# Patient Record
Sex: Female | Born: 1955 | Race: White | Hispanic: No | Smoking: Former smoker
Health system: Southern US, Community
[De-identification: ages and names within clinical notes are randomized; demographics above are authoritative.]

## PROBLEM LIST (undated history)

## (undated) DIAGNOSIS — J449 Chronic obstructive pulmonary disease, unspecified: Secondary | ICD-10-CM

## (undated) DIAGNOSIS — G8929 Other chronic pain: Secondary | ICD-10-CM

## (undated) DIAGNOSIS — G459 Transient cerebral ischemic attack, unspecified: Secondary | ICD-10-CM

## (undated) DIAGNOSIS — R251 Tremor, unspecified: Secondary | ICD-10-CM

## (undated) DIAGNOSIS — J439 Emphysema, unspecified: Secondary | ICD-10-CM

## (undated) DIAGNOSIS — G4730 Sleep apnea, unspecified: Secondary | ICD-10-CM

## (undated) DIAGNOSIS — J45909 Unspecified asthma, uncomplicated: Secondary | ICD-10-CM

## (undated) DIAGNOSIS — Z9221 Personal history of antineoplastic chemotherapy: Secondary | ICD-10-CM

## (undated) DIAGNOSIS — R413 Other amnesia: Secondary | ICD-10-CM

## (undated) DIAGNOSIS — M1990 Unspecified osteoarthritis, unspecified site: Secondary | ICD-10-CM

## (undated) DIAGNOSIS — F329 Major depressive disorder, single episode, unspecified: Secondary | ICD-10-CM

## (undated) DIAGNOSIS — C50919 Malignant neoplasm of unspecified site of unspecified female breast: Secondary | ICD-10-CM

## (undated) DIAGNOSIS — Z803 Family history of malignant neoplasm of breast: Secondary | ICD-10-CM

## (undated) DIAGNOSIS — K219 Gastro-esophageal reflux disease without esophagitis: Secondary | ICD-10-CM

## (undated) DIAGNOSIS — F32A Depression, unspecified: Secondary | ICD-10-CM

## (undated) DIAGNOSIS — K635 Polyp of colon: Secondary | ICD-10-CM

## (undated) DIAGNOSIS — F419 Anxiety disorder, unspecified: Secondary | ICD-10-CM

## (undated) DIAGNOSIS — Z923 Personal history of irradiation: Secondary | ICD-10-CM

## (undated) HISTORY — PX: CERVICAL SPINE SURGERY: SHX589

## (undated) HISTORY — DX: Family history of malignant neoplasm of breast: Z80.3

## (undated) HISTORY — DX: Transient cerebral ischemic attack, unspecified: G45.9

## (undated) HISTORY — DX: Emphysema, unspecified: J43.9

## (undated) HISTORY — DX: Other chronic pain: G89.29

## (undated) HISTORY — DX: Chronic obstructive pulmonary disease, unspecified: J44.9

## (undated) HISTORY — DX: Unspecified osteoarthritis, unspecified site: M19.90

## (undated) HISTORY — DX: Depression, unspecified: F32.A

## (undated) HISTORY — PX: INNER EAR SURGERY: SHX679

## (undated) HISTORY — DX: Tremor, unspecified: R25.1

## (undated) HISTORY — DX: Other amnesia: R41.3

## (undated) HISTORY — DX: Polyp of colon: K63.5

## (undated) HISTORY — PX: ABDOMINAL HYSTERECTOMY: SHX81

## (undated) HISTORY — DX: Unspecified asthma, uncomplicated: J45.909

## (undated) HISTORY — DX: Anxiety disorder, unspecified: F41.9

---
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
## 2010-05-19 HISTORY — PX: LUMBAR FUSION: SHX111

## 2018-03-22 LAB — COLOGUARD: Cologuard: NEGATIVE

## 2019-08-01 ENCOUNTER — Ambulatory Visit (INDEPENDENT_AMBULATORY_CARE_PROVIDER_SITE_OTHER): Payer: Medicare Other | Admitting: Physician Assistant

## 2019-08-01 ENCOUNTER — Encounter: Payer: Self-pay | Admitting: Physician Assistant

## 2019-08-01 ENCOUNTER — Other Ambulatory Visit: Payer: Self-pay

## 2019-08-01 VITALS — BP 140/80 | HR 90 | Temp 97.3°F | Ht 60.0 in | Wt 124.4 lb

## 2019-08-01 DIAGNOSIS — F419 Anxiety disorder, unspecified: Secondary | ICD-10-CM

## 2019-08-01 DIAGNOSIS — F339 Major depressive disorder, recurrent, unspecified: Secondary | ICD-10-CM | POA: Insufficient documentation

## 2019-08-01 DIAGNOSIS — M1990 Unspecified osteoarthritis, unspecified site: Secondary | ICD-10-CM | POA: Insufficient documentation

## 2019-08-01 DIAGNOSIS — J439 Emphysema, unspecified: Secondary | ICD-10-CM

## 2019-08-01 DIAGNOSIS — H9192 Unspecified hearing loss, left ear: Secondary | ICD-10-CM

## 2019-08-01 DIAGNOSIS — Z720 Tobacco use: Secondary | ICD-10-CM | POA: Insufficient documentation

## 2019-08-01 DIAGNOSIS — K219 Gastro-esophageal reflux disease without esophagitis: Secondary | ICD-10-CM

## 2019-08-01 DIAGNOSIS — R634 Abnormal weight loss: Secondary | ICD-10-CM

## 2019-08-01 DIAGNOSIS — E785 Hyperlipidemia, unspecified: Secondary | ICD-10-CM | POA: Insufficient documentation

## 2019-08-01 DIAGNOSIS — R6889 Other general symptoms and signs: Secondary | ICD-10-CM

## 2019-08-01 DIAGNOSIS — Z8249 Family history of ischemic heart disease and other diseases of the circulatory system: Secondary | ICD-10-CM

## 2019-08-01 DIAGNOSIS — K5900 Constipation, unspecified: Secondary | ICD-10-CM

## 2019-08-01 DIAGNOSIS — Z981 Arthrodesis status: Secondary | ICD-10-CM

## 2019-08-01 DIAGNOSIS — J449 Chronic obstructive pulmonary disease, unspecified: Secondary | ICD-10-CM | POA: Insufficient documentation

## 2019-08-01 LAB — URINALYSIS, ROUTINE W REFLEX MICROSCOPIC
Bilirubin Urine: NEGATIVE
Hgb urine dipstick: NEGATIVE
Ketones, ur: NEGATIVE
Nitrite: NEGATIVE
RBC / HPF: NONE SEEN (ref 0–?)
Specific Gravity, Urine: 1.015 (ref 1.000–1.030)
Total Protein, Urine: NEGATIVE
Urine Glucose: NEGATIVE
Urobilinogen, UA: 0.2 (ref 0.0–1.0)
pH: 7 (ref 5.0–8.0)

## 2019-08-01 LAB — CBC WITH DIFFERENTIAL/PLATELET
Basophils Absolute: 0.1 10*3/uL (ref 0.0–0.1)
Basophils Relative: 0.7 % (ref 0.0–3.0)
Eosinophils Absolute: 0.1 10*3/uL (ref 0.0–0.7)
Eosinophils Relative: 0.9 % (ref 0.0–5.0)
HCT: 40.4 % (ref 36.0–46.0)
Hemoglobin: 13.4 g/dL (ref 12.0–15.0)
Lymphocytes Relative: 30.5 % (ref 12.0–46.0)
Lymphs Abs: 2.7 10*3/uL (ref 0.7–4.0)
MCHC: 33.2 g/dL (ref 30.0–36.0)
MCV: 95.7 fl (ref 78.0–100.0)
Monocytes Absolute: 0.7 10*3/uL (ref 0.1–1.0)
Monocytes Relative: 7.9 % (ref 3.0–12.0)
Neutro Abs: 5.3 10*3/uL (ref 1.4–7.7)
Neutrophils Relative %: 60 % (ref 43.0–77.0)
Platelets: 390 10*3/uL (ref 150.0–400.0)
RBC: 4.22 Mil/uL (ref 3.87–5.11)
RDW: 13.2 % (ref 11.5–15.5)
WBC: 8.7 10*3/uL (ref 4.0–10.5)

## 2019-08-01 LAB — COMPREHENSIVE METABOLIC PANEL
ALT: 9 U/L (ref 0–35)
AST: 14 U/L (ref 0–37)
Albumin: 4.3 g/dL (ref 3.5–5.2)
Alkaline Phosphatase: 87 U/L (ref 39–117)
BUN: 13 mg/dL (ref 6–23)
CO2: 31 mEq/L (ref 19–32)
Calcium: 10 mg/dL (ref 8.4–10.5)
Chloride: 101 mEq/L (ref 96–112)
Creatinine, Ser: 0.68 mg/dL (ref 0.40–1.20)
GFR: 86.91 mL/min (ref >60.00)
Glucose, Bld: 89 mg/dL (ref 70–99)
Potassium: 5.4 mEq/L — ABNORMAL HIGH (ref 3.5–5.1)
Sodium: 139 mEq/L (ref 135–145)
Total Bilirubin: 0.5 mg/dL (ref 0.2–1.2)
Total Protein: 7 g/dL (ref 6.0–8.3)

## 2019-08-01 LAB — C-REACTIVE PROTEIN: CRP: <1 mg/dL (ref 0.5–20.0)

## 2019-08-01 LAB — SEDIMENTATION RATE: Sed Rate: 40 mm/hr — ABNORMAL HIGH (ref 0–30)

## 2019-08-01 LAB — TSH: TSH: 0.59 u[IU]/mL (ref 0.35–4.50)

## 2019-08-01 MED ORDER — OMEPRAZOLE 40 MG PO CPDR: 40.0000 mg | DELAYED_RELEASE_CAPSULE | Freq: Two times a day (BID) | ORAL | 1 refills | Status: DC

## 2019-08-01 MED ORDER — TRAZODONE HCL 50 MG PO TABS: 50.0000 mg | ORAL_TABLET | Freq: Every day | ORAL | 1 refills | Status: DC

## 2019-08-01 MED ORDER — MONTELUKAST SODIUM 10 MG PO TABS: 10.0000 mg | ORAL_TABLET | Freq: Every day | ORAL | 1 refills | Status: DC

## 2019-08-01 MED ORDER — TRINTELLIX 20 MG PO TABS: 20.0000 mg | ORAL_TABLET | Freq: Every day | ORAL | 1 refills | Status: DC

## 2019-08-01 MED ORDER — CYCLOBENZAPRINE HCL 10 MG PO TABS: 10.0000 mg | ORAL_TABLET | Freq: Three times a day (TID) | ORAL | 1 refills | Status: DC

## 2019-08-01 MED ORDER — GABAPENTIN 800 MG PO TABS: 800.0000 mg | ORAL_TABLET | Freq: Three times a day (TID) | ORAL | 1 refills | Status: DC

## 2019-08-01 MED ORDER — CELECOXIB 100 MG PO CAPS: 100.0000 mg | ORAL_CAPSULE | Freq: Every day | ORAL | 0 refills | Status: DC

## 2019-08-01 MED ORDER — LINACLOTIDE 290 MCG PO CAPS: 290.0000 ug | ORAL_CAPSULE | Freq: Every day | ORAL | 1 refills | Status: DC

## 2019-08-01 MED ORDER — ATORVASTATIN CALCIUM 10 MG PO TABS: 10.0000 mg | ORAL_TABLET | Freq: Every day | ORAL | 1 refills | Status: DC

## 2019-08-01 MED ORDER — ALBUTEROL SULFATE HFA 108 (90 BASE) MCG/ACT IN AERS: 1.0000 | INHALATION_SPRAY | Freq: Four times a day (QID) | RESPIRATORY_TRACT | 5 refills | Status: DC | PRN

## 2019-08-01 NOTE — Patient Instructions (Signed)
It was great to see you!  You will be contacted about your referrals and lab results.  Let's follow-up in 2-4 weeks to follow-up on your weight loss, sooner if you have concerns.  Please work on trying to eat more during the day, consider drinking a Boost or Ensure daily.  Take care,  Inda Coke PA-C

## 2019-08-01 NOTE — Progress Notes (Signed)
Holly Phillips is a 64 y.o. female here to Establish care.  I acted as a Education administrator for [employer], PA-C Anselmo Pickler, LPN  History of Present Illness:   Chief Complaint  Patient presents with  . Establish Care   Tobacco abuse; Emphysema -- started at age 70 years old; unsure if she has history of lung nodules, (possibly on L lung?), not on oxygen. Currently on Albuterol prn and singulair. She states that she was on another inhaler at one point but cannot remember the name of it and said it was unaffordable. She would like referral to pulmonology as she has a history of emphysema.  Lumbar fusion; arthritis -- had a lumbar fusion of L3-L7 in 2012 and has had back pain since; most recent pain management regimen, per patient report is: percocet QID, celebrex 100 mg daily, indomethacin 50 mg daily, gabapentin 800 mg TID, flexeril 10 mg TID prn. She was seeing pain management in [place] and would like referral for evaluation and management at this time.  Depression -- currently on Trintellix 20 mg daily and Trazodone 50 mg daily. Tolerates this regimen well and feels as though her symptoms are currently well controlled. Does have history of suicide attempt x 2 (overdose.)  Depression screen [facility] 2/9 08/01/2019  Decreased Interest 1  Down, Depressed, Hopeless 0  PHQ - 2 Score 1  Altered sleeping 1  Tired, decreased energy 1  Change in appetite 3  Feeling bad or failure about yourself  0  Trouble concentrating 0  Moving slowly or fidgety/restless 0  Suicidal thoughts 0  PHQ-9 Score 6  Difficult doing work/chores Not difficult at all   GAD 7 : Generalized Anxiety Score 08/01/2019  Nervous, Anxious, on Edge 1  Control/stop worrying 1  Worry too much - different things 1  Trouble relaxing 1  Restless 1  Easily annoyed or irritable 0  Afraid - awful might happen 1  Total GAD 7 Score 6  Anxiety Difficulty Somewhat difficult   HLD; Family history of heart disease -- has been on  Lipitor 10 mg for quite some time. Tolerates well without myalgias. Reports that due to her family history of heart disease, she saw cardiology regularly in [place] and would like referral for further evaluation today. Has periodic stress tests per her report.  Chronic constipation -- uses Linzess prn. Does not use more than once a week. Symptoms are well controlled with this medication. Denies unusual abdominal pain or rectal bleeding. States that she is UTD on her colonoscopy.  GERD -- currently on omeprazole 40 mg daily. Feels like this medication works well for her. Denies any unusual abdominal pain, rectal bleeding or other concerns.  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 She had occasional swallowing issues that led her to see an ENT and feels like she is due to follow-up with one. Feels like her swallowing is possibly starting to get worse again.  Unintentional weight loss -- 142 lb last month, and is currently down to 124 lb. Weight loss has been since she moved to [place]. Has altered taste.  Eats only one meal a day at baseline. Does have difficulty swallowing, due to prior history of surgery -- see "abnormal throat concerns" above. No unusual  abdominal pain or rectal bleeding.   Health Maintenance: Immunizations -- not sure will wait for records.  Colonoscopy -- 5 years ago Mammogram -- due June 2021 PAP -- N/A Hysterectomy Bone Density -- Never Weight -- Weight: 124 lb 6.1 oz (56.4 kg)   Depression screen PHQ 2/9 08/01/2019  Decreased Interest 1  Down, Depressed, Hopeless 0  PHQ - 2 Score 1  Altered sleeping 1  Tired, decreased energy 1  Change in appetite 3  Feeling bad or failure about yourself  0  Trouble concentrating 0   Moving slowly or fidgety/restless 0  Suicidal thoughts 0  PHQ-9 Score 6  Difficult doing work/chores Not difficult at all    GAD 7 : Generalized Anxiety Score 08/01/2019  Nervous, Anxious, on Edge 1  Control/stop worrying 1  Worry too much - different things 1  Trouble relaxing 1  Restless 1  Easily annoyed or irritable 0  Afraid - awful might happen 1  Total GAD 7 Score 6  Anxiety Difficulty Somewhat difficult     Other providers/specialists: No care team member to display   Past Medical History:  Diagnosis Date  . Anxiety   . Arthritis   . Asthma   . Colon polyps   . COPD (chronic obstructive pulmonary disease) ([place])   . Depression   . Emphysema of lung ([place])      Social History   Socioeconomic History  . Marital status: Widowed    Spouse name: Not on file  . Number of children: Not on file  . Years of education: Not on file  . Highest education level: Not on file  Occupational History  . Not on file  Tobacco Use  . Smoking status: Current Every Day Smoker    Packs/day: 1.00    Types: Cigarettes  . Smokeless tobacco: Never Used  Substance and Sexual Activity  . Alcohol use: Not Currently  . Drug use: Yes    Types: Other-see comments    Comment: CBD and medical marijuana  . Sexual activity: Not Currently  Other Topics Concern  . Not on file  Social History Narrative   Lives with daughter and 71 y/o grandson   From [place], moved to [place] in 07-18-2019   Husband passed away in 08-14-2016   Social Determinants of Health   Financial Resource Strain:   . Difficulty of Paying Living Expenses:   Food Insecurity:   . Worried About Charity fundraiser in the Last Year:   . Arboriculturist in the Last Year:   Transportation Needs:   . Film/video editor (Medical):   Marland Kitchen Lack of Transportation (Non-Medical):   Physical Activity:   . Days of Exercise per Week:   . Minutes of Exercise per Session:   Stress:   . Feeling of Stress :   Social Connections:    . Frequency of Communication with Friends and Family:   . Frequency of Social Gatherings with Friends and Family:   . Attends Religious Services:   . Active Member of Clubs or Organizations:   . Attends Archivist Meetings:   Marland Kitchen Marital Status:   Intimate Partner Violence:   . Fear of Current or Ex-Partner:   . Emotionally Abused:   Marland Kitchen Physically Abused:   . Sexually Abused:     Past Surgical History:  Procedure Laterality Date  . ABDOMINAL HYSTERECTOMY    . INNER EAR SURGERY    . LUMBAR FUSION  Aug 15, 2010   L3-L7  History reviewed. No pertinent family history.  No Known Allergies   Current Medications:   Current Outpatient Medications:  .  albuterol (VENTOLIN HFA) 108 (90 Base) MCG/ACT inhaler, Inhale 1-2 puffs into the lungs every 6 (six) hours as needed for wheezing or shortness of breath., Disp: 18 g, Rfl: 5 .  atorvastatin (LIPITOR) 10 MG tablet, Take 1 tablet (10 mg total) by mouth daily., Disp: 90 tablet, Rfl: 1 .  celecoxib (CELEBREX) 100 MG capsule, Take 1 capsule (100 mg total) by mouth daily., Disp: 90 capsule, Rfl: 0 .  cyclobenzaprine (FLEXERIL) 10 MG tablet, Take 1 tablet (10 mg total) by mouth 3 (three) times daily., Disp: 90 tablet, Rfl: 1 .  gabapentin (NEURONTIN) 800 MG tablet, Take 1 tablet (800 mg total) by mouth 3 (three) times daily., Disp: 270 tablet, Rfl: 1 .  indomethacin (INDOCIN) 50 MG capsule, , Disp: , Rfl:  .  linaclotide (LINZESS) 290 MCG CAPS capsule, Take 1 capsule (290 mcg total) by mouth daily before breakfast., Disp: 30 capsule, Rfl: 1 .  montelukast (SINGULAIR) 10 MG tablet, Take 1 tablet (10 mg total) by mouth daily., Disp: 90 tablet, Rfl: 1 .  omeprazole (PRILOSEC) 40 MG capsule, Take 1 capsule (40 mg total) by mouth 2 (two) times daily., Disp: 90 capsule, Rfl: 1 .  oxyCODONE-acetaminophen (PERCOCET) 10-325 MG tablet, Take 1 tablet by mouth 4 (four) times daily as needed., Disp: , Rfl:  .  traZODone (DESYREL) 50 MG tablet, Take 1  tablet (50 mg total) by mouth at bedtime., Disp: 90 tablet, Rfl: 1 .  TRINTELLIX 20 MG TABS tablet, Take 1 tablet (20 mg total) by mouth daily., Disp: 90 tablet, Rfl: 1   Review of Systems:   ROS Negative unless otherwise specified per HPI.  Vitals:   Vitals:   08/01/19 1332  BP: 140/80  Pulse: 90  Temp: (!) 97.3 F (36.3 C)  TempSrc: Temporal  SpO2: 93%  Weight: 124 lb 6.1 oz (56.4 kg)  Height: 5' (1.524 m)      Body mass index is 24.29 kg/m.  Physical Exam:   Physical Exam Vitals and nursing note reviewed.  Constitutional:      General: She is not in acute distress.    Appearance: She is well-developed. She is not ill-appearing or toxic-appearing.  Cardiovascular:     Rate and Rhythm: Normal rate and regular rhythm.     Pulses: Normal pulses.     Heart sounds: Normal heart sounds, S1 normal and S2 normal.     Comments: No LE edema Pulmonary:     Effort: Pulmonary effort is normal.     Breath sounds: Normal breath sounds.  Skin:    General: Skin is warm and dry.  Neurological:     Mental Status: She is alert.     GCS: GCS eye subscore is 4. GCS verbal subscore is 5. GCS motor subscore is 6.  Psychiatric:        Attention and Perception: Attention normal.        Mood and Affect: Mood is anxious.        Speech: Speech normal.        Behavior: Behavior normal. Behavior is cooperative.     No results found for this or any previous visit.  Assessment and Plan:   Mariaguadalupe was seen today for establish care.  Diagnoses and all orders for this visit:  Unintentional weight loss Will begin initial work-up of basic labs, urinalysis, inflammatory markers.  Recommended that  she work on trying to eat more protein and consider supplement such as Boost daily.  Also we are requesting records from her prior PCP.  Follow-up in 2 to 4 weeks for further evaluation.  May need to consider imaging at next visit. -     CBC with Differential/Platelet -     Comprehensive  metabolic panel -     TSH -     Urinalysis, Routine w reflex microscopic -     Sedimentation rate -     C-reactive protein  Tobacco abuse Encouraged cessation. -     Ambulatory referral to Pulmonology -     Ambulatory referral to Cardiology  Pulmonary emphysema, unspecified emphysema type [facility]) Referral to pulmonary for further evaluation management.  I have refilled her albuterol inhaler. -     Ambulatory referral to Pulmonology  Arthritis; History of lumbar fusion Reviewed her medications.  I recommended that she does not continue both indomethacin and Celebrex, and she has decided to proceed with Celebrex.  We will discontinue indomethacin.  I have refilled her gabapentin, Flexeril, Celebrex.  I will put in a pain management referral for further management of Percocet. -     Ambulatory referral to Pain Clinic  Recurrent depression ([place]); Anxiety Well-controlled per patient report.  Continue Trintellix 20 mg daily and trazodone 50 mg daily.  Follow-up in 6 months, sooner if concerns.  Denies SI/HI today.  Hyperlipidemia, unspecified hyperlipidemia type I have refilled her Lipitor 10 mg daily. -     Ambulatory referral to Cardiology  Constipation, unspecified constipation type Well managed per patient report with as needed Linzess.  I have refilled this for her today.  Follow-up if any worsening.  Gastroesophageal reflux disease, unspecified whether esophagitis present Well managed per patient report with daily Prilosec.  I have refilled this today for her.  Follow-up if any worsening.  Family history of heart disease Referral to cardiology per patient request. -     Ambulatory referral to Cardiology  Hearing difficulty of left ear; Abnormal ear, nose, and throat evaluation Referral to ENT for further evaluation and management. -     Ambulatory referral to ENT   Other orders -     albuterol (VENTOLIN HFA) 108 (90 Base) MCG/ACT inhaler; Inhale 1-2 puffs into the lungs every  6 (six) hours as needed for wheezing or shortness of breath. -     atorvastatin (LIPITOR) 10 MG tablet; Take 1 tablet (10 mg total) by mouth daily. -     celecoxib (CELEBREX) 100 MG capsule; Take 1 capsule (100 mg total) by mouth daily. -     cyclobenzaprine (FLEXERIL) 10 MG tablet; Take 1 tablet (10 mg total) by mouth 3 (three) times daily. -     gabapentin (NEURONTIN) 800 MG tablet; Take 1 tablet (800 mg total) by mouth 3 (three) times daily. -     linaclotide (LINZESS) 290 MCG CAPS capsule; Take 1 capsule (290 mcg total) by mouth daily before breakfast. -     montelukast (SINGULAIR) 10 MG tablet; Take 1 tablet (10 mg total) by mouth daily. -     omeprazole (PRILOSEC) 40 MG capsule; Take 1 capsule (40 mg total) by mouth 2 (two) times daily. -     traZODone (DESYREL) 50 MG tablet; Take 1 tablet (50 mg total) by mouth at bedtime. -     TRINTELLIX 20 MG TABS tablet; Take 1 tablet (20 mg total) by mouth daily.    . Reviewed expectations re: course of current  medical issues. . Discussed self-management of symptoms. . Outlined signs and symptoms indicating need for more acute intervention. . Patient verbalized understanding and all questions were answered. . See orders for this visit as documented in the electronic medical record. . Patient received an After-Visit Summary.  CMA or LPN served as scribe during this visit. History, Physical, and Plan performed by medical provider. The above documentation has been reviewed and is accurate and complete.  This appointment required 65 minutes of patient care (this includes precharting, chart review, review of results, face-to-face care, etc.).  Inda Coke, PA-C

## 2019-08-02 ENCOUNTER — Other Ambulatory Visit: Payer: Self-pay | Admitting: Physician Assistant

## 2019-08-02 DIAGNOSIS — E875 Hyperkalemia: Secondary | ICD-10-CM

## 2019-08-03 ENCOUNTER — Other Ambulatory Visit (INDEPENDENT_AMBULATORY_CARE_PROVIDER_SITE_OTHER): Payer: Medicare Other

## 2019-08-03 ENCOUNTER — Other Ambulatory Visit: Payer: Self-pay

## 2019-08-03 DIAGNOSIS — E875 Hyperkalemia: Secondary | ICD-10-CM

## 2019-08-03 LAB — BASIC METABOLIC PANEL
BUN: 12 mg/dL (ref 6–23)
CO2: 30 mEq/L (ref 19–32)
Calcium: 9.5 mg/dL (ref 8.4–10.5)
Chloride: 102 mEq/L (ref 96–112)
Creatinine, Ser: 0.69 mg/dL (ref 0.40–1.20)
GFR: 85.46 mL/min (ref >60.00)
Glucose, Bld: 161 mg/dL — ABNORMAL HIGH (ref 70–99)
Potassium: 4.6 mEq/L (ref 3.5–5.1)
Sodium: 138 mEq/L (ref 135–145)

## 2019-08-22 ENCOUNTER — Other Ambulatory Visit: Payer: Self-pay | Admitting: Pain Medicine

## 2019-08-22 DIAGNOSIS — G8929 Other chronic pain: Secondary | ICD-10-CM

## 2019-08-22 DIAGNOSIS — M79651 Pain in right thigh: Secondary | ICD-10-CM

## 2019-08-22 DIAGNOSIS — M545 Low back pain, unspecified: Secondary | ICD-10-CM

## 2019-08-25 DIAGNOSIS — R1310 Dysphagia, unspecified: Secondary | ICD-10-CM | POA: Insufficient documentation

## 2019-08-25 DIAGNOSIS — H906 Mixed conductive and sensorineural hearing loss, bilateral: Secondary | ICD-10-CM | POA: Insufficient documentation

## 2019-08-25 DIAGNOSIS — R1314 Dysphagia, pharyngoesophageal phase: Secondary | ICD-10-CM | POA: Insufficient documentation

## 2019-08-31 ENCOUNTER — Ambulatory Visit: Payer: Federal, State, Local not specified - PPO | Admitting: Cardiovascular Disease

## 2019-09-01 ENCOUNTER — Encounter: Payer: Self-pay | Admitting: Cardiovascular Disease

## 2019-09-01 ENCOUNTER — Other Ambulatory Visit: Payer: Self-pay

## 2019-09-01 ENCOUNTER — Ambulatory Visit (INDEPENDENT_AMBULATORY_CARE_PROVIDER_SITE_OTHER): Payer: Medicare Other | Admitting: Cardiovascular Disease

## 2019-09-01 VITALS — BP 120/72 | HR 100 | Temp 97.1°F | Ht 60.0 in | Wt 127.0 lb

## 2019-09-01 DIAGNOSIS — R079 Chest pain, unspecified: Secondary | ICD-10-CM | POA: Diagnosis not present

## 2019-09-01 DIAGNOSIS — K219 Gastro-esophageal reflux disease without esophagitis: Secondary | ICD-10-CM

## 2019-09-01 DIAGNOSIS — Z720 Tobacco use: Secondary | ICD-10-CM | POA: Diagnosis not present

## 2019-09-01 NOTE — Patient Instructions (Signed)
Medication Instructions:  Your physician recommends that you continue on your current medications as directed. Please refer to the Current Medication list given to you today.  *If you need a refill on your cardiac medications before your next appointment, please call your pharmacy*   Lab Work: None today  If you have labs (blood work) drawn today and your tests are completely normal, you will receive your results only by: Marland Kitchen MyChart Message (if you have MyChart) OR . A paper copy in the mail If you have any lab test that is abnormal or we need to change your treatment, we will call you to review the results.   Testing/Procedures: Your physician has requested that you have en exercise stress myoview. For further information please visit [email or web address]. Please follow instruction sheet, as given.    Follow-Up: At [facility], you and your health needs are our priority.  As part of our continuing mission to provide you with exceptional heart care, we have created designated Provider Care Teams.  These Care Teams include your primary Cardiologist (physician) and Advanced Practice Providers (APPs -  Physician Assistants and Nurse Practitioners) who all work together to provide you with the care you need, when you need it.  We recommend signing up for the patient portal called "MyChart".  Sign up information is provided on this After Visit Summary.  MyChart is used to connect with patients for Virtual Visits (Telemedicine).  Patients are able to view lab/test results, encounter notes, upcoming appointments, etc.  Non-urgent messages can be sent to your provider as well.   To learn more about what you can do with MyChart, go to [email or web address].    Your next appointment:   3 month(s)  The format for your next appointment:   In Person  Provider:   Kate Sable, MD   Other Instructions None       Thank you for choosing [facility]  !

## 2019-09-01 NOTE — Progress Notes (Signed)
CARDIOLOGY CONSULT NOTE  Patient ID: Holly Phillips MRN: [id number] DOB/AGE: [birth year] 64 y.o.  Admit date: (Not on file) Primary Physician: Inda Coke, [place]  Reason for Consultation: Family history of heart disease  HPI: Holly Phillips is a 64 y.o. female who is being seen today as she has a family history of heart disease at the request of Inda Coke, [place].   Past medical history also includes tobacco use and COPD.  She recently moved here from [place].  Her husband passed away in 2016-08-14.  She lives with her daughter and grandson.  She moved here from [place], [place].  She used to undergo a stress test on a yearly basis.  She was told she had some "mild plaque buildup ".  She denies ever having had a cardiac catheterization.  Her last hospitalization was about 2 and half years ago for acute bronchitis.  Since that time she has slowed down and it takes her about 2-1/2 days to clean her house.  Prior to that it took her a day.  She has chest tightness when lying down.  She has chronic exertional dyspnea which is stable.  She does not use oxygen.  She denies leg swelling, orthopnea, paroxysmal nocturnal dyspnea.  She has GERD and takes omeprazole.  ECG performed in the office today which I ordered and personally interpreted demonstrates normal sinus rhythm with no ischemic ST segment or T-wave abnormalities, nor any arrhythmias.  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   Family history: Mother underwent CABG at age 25 and died at the age of 76.  Her sister had 2 coronary artery stents placed at the age of 80.   No Known Allergies  Current Outpatient Medications  Medication Sig Dispense Refill  . albuterol (VENTOLIN HFA) 108 (90 Base) MCG/ACT inhaler Inhale 1-2 puffs into the lungs every 6 (six) hours as needed for wheezing or shortness of breath. 18 g 5  . atorvastatin (LIPITOR) 10 MG tablet Take 1 tablet (10 mg total) by mouth daily. 90 tablet  1  . celecoxib (CELEBREX) 100 MG capsule Take 1 capsule (100 mg total) by mouth daily. 90 capsule 0  . cyclobenzaprine (FLEXERIL) 10 MG tablet Take 1 tablet (10 mg total) by mouth 3 (three) times daily. 90 tablet 1  . gabapentin (NEURONTIN) 800 MG tablet Take 1 tablet (800 mg total) by mouth 3 (three) times daily. 270 tablet 1  . linaclotide (LINZESS) 290 MCG CAPS capsule Take 1 capsule (290 mcg total) by mouth daily before breakfast. 30 capsule 1  . montelukast (SINGULAIR) 10 MG tablet Take 1 tablet (10 mg total) by mouth daily. 90 tablet 1  . omeprazole (PRILOSEC) 40 MG capsule Take 1 capsule (40 mg total) by mouth 2 (two) times daily. 90 capsule 1  . oxyCODONE-acetaminophen (PERCOCET) 10-325 MG tablet Take 1 tablet by mouth 4 (four) times daily as needed.    . traZODone (DESYREL) 150 MG tablet Take 150 mg by mouth at bedtime.    . TRINTELLIX 20 MG TABS tablet Take 1 tablet (20 mg total) by mouth daily. 90 tablet 1   No current facility-administered medications for this visit.    Past Medical History:  Diagnosis Date  . Anxiety   . Arthritis   . Asthma   . Colon polyps   . COPD (chronic obstructive pulmonary disease) ([place])   . Depression   . Emphysema of lung [facility])     Past Surgical History:  Procedure Laterality  Date  . ABDOMINAL HYSTERECTOMY    . INNER EAR SURGERY    . LUMBAR FUSION  2010-08-13   L3-L7    Social History   Socioeconomic History  . Marital status: Widowed    Spouse name: Not on file  . Number of children: Not on file  . Years of education: Not on file  . Highest education level: Not on file  Occupational History  . Not on file  Tobacco Use  . Smoking status: Current Every Day Smoker    Packs/day: 1.00    Types: Cigarettes  . Smokeless tobacco: Never Used  Substance and Sexual Activity  . Alcohol use: Not Currently  . Drug use: Yes    Types: Other-see comments    Comment: CBD and medical marijuana  . Sexual activity: Not Currently  Other Topics  Concern  . Not on file  Social History Narrative   Lives with daughter and 73 y/o grandson   From [place], moved to [place] in July 16, 2019   Husband passed away in August 12, 2016   Social Determinants of Health   Financial Resource Strain:   . Difficulty of Paying Living Expenses:   Food Insecurity:   . Worried About Charity fundraiser in the Last Year:   . Arboriculturist in the Last Year:   Transportation Needs:   . Film/video editor (Medical):   Marland Kitchen Lack of Transportation (Non-Medical):   Physical Activity:   . Days of Exercise per Week:   . Minutes of Exercise per Session:   Stress:   . Feeling of Stress :   Social Connections:   . Frequency of Communication with Friends and Family:   . Frequency of Social Gatherings with Friends and Family:   . Attends Religious Services:   . Active Member of Clubs or Organizations:   . Attends Archivist Meetings:   Marland Kitchen Marital Status:   Intimate Partner Violence:   . Fear of Current or Ex-Partner:   . Emotionally Abused:   Marland Kitchen Physically Abused:   . Sexually Abused:       Current Meds  Medication Sig  . albuterol (VENTOLIN HFA) 108 (90 Base) MCG/ACT inhaler Inhale 1-2 puffs into the lungs every 6 (six) hours as needed for wheezing or shortness of breath.  Marland Kitchen atorvastatin (LIPITOR) 10 MG tablet Take 1 tablet (10 mg total) by mouth daily.  . celecoxib (CELEBREX) 100 MG capsule Take 1 capsule (100 mg total) by mouth daily.  . cyclobenzaprine (FLEXERIL) 10 MG tablet Take 1 tablet (10 mg total) by mouth 3 (three) times daily.  Marland Kitchen gabapentin (NEURONTIN) 800 MG tablet Take 1 tablet (800 mg total) by mouth 3 (three) times daily.  Marland Kitchen linaclotide (LINZESS) 290 MCG CAPS capsule Take 1 capsule (290 mcg total) by mouth daily before breakfast.  . montelukast (SINGULAIR) 10 MG tablet Take 1 tablet (10 mg total) by mouth daily.  Marland Kitchen omeprazole (PRILOSEC) 40 MG capsule Take 1 capsule (40 mg total) by mouth 2 (two) times daily.  Marland Kitchen  oxyCODONE-acetaminophen (PERCOCET) 10-325 MG tablet Take 1 tablet by mouth 4 (four) times daily as needed.  . traZODone (DESYREL) 150 MG tablet Take 150 mg by mouth at bedtime.  . TRINTELLIX 20 MG TABS tablet Take 1 tablet (20 mg total) by mouth daily.  . [DISCONTINUED] traZODone (DESYREL) 50 MG tablet Take 1 tablet (50 mg total) by mouth at bedtime.      Review of systems complete and found to be negative  unless listed above in HPI   Barbarann Ehlers, RN was present throughout the entirety of the encounter.  Physical exam Blood pressure 120/72, pulse 100, temperature (!) 97.1 F (36.2 C), height 5' (1.524 m), weight 127 lb (57.6 kg), SpO2 95 %. General: NAD Neck: No JVD, no thyromegaly or thyroid nodule.  Lungs: Clear to auscultation bilaterally with normal respiratory effort. CV: Nondisplaced PMI. Regular rate and rhythm, normal S1/S2, no S3/S4, no murmur.  No peripheral edema.  No carotid bruit.    Abdomen: Soft, nontender, no distention.  Skin: Intact without lesions or rashes.  Neurologic: Alert and oriented x 3.  Psych: Normal affect. Extremities: No clubbing or cyanosis.  HEENT: Normal.   ECG: Most recent ECG reviewed.   Labs: Lab Results  Component Value Date/Time   K 4.6 08/03/2019 02:08 PM   BUN 12 08/03/2019 02:08 PM   CREATININE 0.69 08/03/2019 02:08 PM   ALT 9 08/01/2019 02:10 PM   TSH 0.59 08/01/2019 02:10 PM   HGB 13.4 08/01/2019 02:10 PM     Lipids: No results found for: LDLCALC, LDLDIRECT, CHOL, TRIG, HDL      ASSESSMENT AND PLAN:   1.  Chest tightness: Symptoms are somewhat atypical but she has a strong family history of premature coronary artery disease and a long history of tobacco use.  She was told she had some "mild plaque buildup "as per previous stress test in [place].  I will try to obtain those records. I will proceed with a nuclear myocardial perfusion imaging study to evaluate for ischemic heart disease (Lexiscan Myoview).  2.  GERD:   Currently on omeprazole.  She does have dysphagia for solids.  3.  Tobacco use: She is a longtime smoker.   Disposition: Follow up in 3 months  Signed: Kate Sable, M.D., F.A.C.C.  09/01/2019, 2:51 PM

## 2019-09-07 ENCOUNTER — Other Ambulatory Visit: Payer: Self-pay

## 2019-09-07 ENCOUNTER — Ambulatory Visit (INDEPENDENT_AMBULATORY_CARE_PROVIDER_SITE_OTHER): Payer: Medicare Other | Admitting: Physician Assistant

## 2019-09-07 ENCOUNTER — Encounter: Payer: Self-pay | Admitting: Physician Assistant

## 2019-09-07 VITALS — BP 124/76 | HR 94 | Temp 97.3°F | Ht 60.0 in | Wt 129.0 lb

## 2019-09-07 DIAGNOSIS — R300 Dysuria: Secondary | ICD-10-CM

## 2019-09-07 DIAGNOSIS — R634 Abnormal weight loss: Secondary | ICD-10-CM | POA: Diagnosis not present

## 2019-09-07 DIAGNOSIS — R1310 Dysphagia, unspecified: Secondary | ICD-10-CM | POA: Diagnosis not present

## 2019-09-07 LAB — POC URINALSYSI DIPSTICK (AUTOMATED)
Bilirubin, UA: NEGATIVE
Blood, UA: NEGATIVE
Glucose, UA: NEGATIVE
Ketones, UA: NEGATIVE
Nitrite, UA: NEGATIVE
Protein, UA: NEGATIVE
Spec Grav, UA: 1.015 (ref 1.010–1.025)
Urobilinogen, UA: 0.2 E.U./dL
pH, UA: 6.5 (ref 5.0–8.0)

## 2019-09-07 NOTE — Progress Notes (Signed)
Holly Phillips is a 64 y.o. female is here for a follow up.  I acted as a Education administrator for [employer], PA-C [employer], [place]  History of Present Illness:   Chief Complaint  Patient presents with  . Unintentional weight loss    HPI   Unintentional weight loss; Dysphagia Gained 2 lb since she last saw me. Doesn't eat breakfast or lunch, and this is normal for her. She has concerns regarding choking -- she gets dysphagia with solids, and this is part of the reason why she doesn't eat much at home (when she is usually alone.) Cannot tolerate breads, meats, salads without having to regurgitate her food. Symptoms are getting worse with time. Denies: rectal bleeding. Had a colonoscopy 5 years ago, had some polyps removed --we do not have this record, she is unsure of when she is supposed to have recall.   Wt Readings from Last 5 Encounters:  09/07/19 129 lb (58.5 kg)  09/01/19 127 lb (57.6 kg)  08/01/19 124 lb 6.1 oz (56.4 kg)   Dysuria Had burning and pressure a week ago. Took AZO for a few days and this improved, last took this on Saturday. Still having some slight issues. Denies: fever, chills, back pain, hx of UTI.   Health Maintenance Due  Topic Date Due  . Hepatitis C Screening  Never done  . HIV Screening  Never done  . COVID-19 Vaccine (1) Never done  . MAMMOGRAM  Never done  . COLONOSCOPY  Never done    Past Medical History:  Diagnosis Date  . Anxiety   . Arthritis   . Asthma   . Colon polyps   . COPD (chronic obstructive pulmonary disease) ([place])   . Depression   . Emphysema of lung ([place])      Social History   Socioeconomic History  . Marital status: Widowed    Spouse name: Not on file  . Number of children: Not on file  . Years of education: Not on file  . Highest education level: Not on file  Occupational History  . Not on file  Tobacco Use  . Smoking status: Current Every Day Smoker    Packs/day: 1.00    Types: Cigarettes  . Smokeless tobacco:  Never Used  Substance and Sexual Activity  . Alcohol use: Not Currently  . Drug use: Yes    Types: Other-see comments    Comment: CBD and medical marijuana  . Sexual activity: Not Currently  Other Topics Concern  . Not on file  Social History Narrative   Lives with daughter and 19 y/o grandson   From [place], moved to [place] in 08-08-19   Husband passed away in 09/04/2016   Social Determinants of Health   Financial Resource Strain:   . Difficulty of Paying Living Expenses:   Food Insecurity:   . Worried About Charity fundraiser in the Last Year:   . Arboriculturist in the Last Year:   Transportation Needs:   . Film/video editor (Medical):   Marland Kitchen Lack of Transportation (Non-Medical):   Physical Activity:   . Days of Exercise per Week:   . Minutes of Exercise per Session:   Stress:   . Feeling of Stress :   Social Connections:   . Frequency of Communication with Friends and Family:   . Frequency of Social Gatherings with Friends and Family:   . Attends Religious Services:   . Active Member of Clubs or Organizations:   .  Attends Archivist Meetings:   Marland Kitchen Marital Status:   Intimate Partner Violence:   . Fear of Current or Ex-Partner:   . Emotionally Abused:   Marland Kitchen Physically Abused:   . Sexually Abused:     Past Surgical History:  Procedure Laterality Date  . ABDOMINAL HYSTERECTOMY    . INNER EAR SURGERY    . LUMBAR FUSION  2012   L3-L7    Family History  Problem Relation Age of Onset  . Depression Mother   . Diabetes Mother   . Hypertension Mother   . Hyperlipidemia Mother   . Heart attack Mother   . Osteoarthritis Father   . Asthma Father   . COPD Father   . Breast cancer Sister   . Heart attack Maternal Grandmother   . Lupus Sister   . Osteoarthritis Sister   . Asthma Sister   . COPD Sister   . Diabetes Sister   . Drug abuse Sister   . Heart attack Sister     PMHx, SurgHx, SocialHx, FamHx, Medications, and Allergies were reviewed in the  Visit Navigator and updated as appropriate.   Patient Active Problem List   Diagnosis Date Noted  . Mixed conductive and sensorineural hearing loss of both ears 08/25/2019  . Pharyngoesophageal dysphagia 08/25/2019  . Tobacco abuse 08/01/2019  . Emphysema of lung ([place]) 08/01/2019  . Arthritis 08/01/2019  . History of lumbar fusion 08/01/2019  . Recurrent depression ([place]) 08/01/2019  . Anxiety 08/01/2019  . Hyperlipidemia 08/01/2019  . Constipation 08/01/2019  . Gastroesophageal reflux disease 08/01/2019  . Family history of heart disease 08/01/2019    Social History   Tobacco Use  . Smoking status: Current Every Day Smoker    Packs/day: 1.00    Types: Cigarettes  . Smokeless tobacco: Never Used  Substance Use Topics  . Alcohol use: Not Currently  . Drug use: Yes    Types: Other-see comments    Comment: CBD and medical marijuana    Current Medications and Allergies:    Current Outpatient Medications:  .  albuterol (VENTOLIN HFA) 108 (90 Base) MCG/ACT inhaler, Inhale 1-2 puffs into the lungs every 6 (six) hours as needed for wheezing or shortness of breath., Disp: 18 g, Rfl: 5 .  atorvastatin (LIPITOR) 10 MG tablet, Take 1 tablet (10 mg total) by mouth daily., Disp: 90 tablet, Rfl: 1 .  celecoxib (CELEBREX) 100 MG capsule, Take 1 capsule (100 mg total) by mouth daily., Disp: 90 capsule, Rfl: 0 .  cyclobenzaprine (FLEXERIL) 10 MG tablet, Take 1 tablet (10 mg total) by mouth 3 (three) times daily., Disp: 90 tablet, Rfl: 1 .  gabapentin (NEURONTIN) 800 MG tablet, Take 1 tablet (800 mg total) by mouth 3 (three) times daily., Disp: 270 tablet, Rfl: 1 .  linaclotide (LINZESS) 290 MCG CAPS capsule, Take 1 capsule (290 mcg total) by mouth daily before breakfast., Disp: 30 capsule, Rfl: 1 .  montelukast (SINGULAIR) 10 MG tablet, Take 1 tablet (10 mg total) by mouth daily., Disp: 90 tablet, Rfl: 1 .  omeprazole (PRILOSEC) 40 MG capsule, Take 1 capsule (40 mg total) by mouth 2 (two)  times daily., Disp: 90 capsule, Rfl: 1 .  oxyCODONE-acetaminophen (PERCOCET) 10-325 MG tablet, Take 1 tablet by mouth 4 (four) times daily as needed., Disp: , Rfl:  .  traZODone (DESYREL) 150 MG tablet, Take 150 mg by mouth at bedtime., Disp: , Rfl:  .  TRINTELLIX 20 MG TABS tablet, Take 1 tablet (20 mg total) by  mouth daily., Disp: 90 tablet, Rfl: 1  No Known Allergies  Review of Systems   ROS  Negative unless otherwise specified per HPI.  Vitals:   Vitals:   09/07/19 1333  BP: 124/76  Pulse: 94  Temp: (!) 97.3 F (36.3 C)  TempSrc: Temporal  Weight: 129 lb (58.5 kg)  Height: 5' (1.524 m)     Body mass index is 25.19 kg/m.   Physical Exam:    Physical Exam Vitals and nursing note reviewed.  Constitutional:      General: She is not in acute distress.    Appearance: She is well-developed. She is not ill-appearing or toxic-appearing.  Cardiovascular:     Rate and Rhythm: Normal rate and regular rhythm.     Pulses: Normal pulses.     Heart sounds: Normal heart sounds, S1 normal and S2 normal.     Comments: No LE edema Pulmonary:     Effort: Pulmonary effort is normal.     Breath sounds: Normal breath sounds.  Skin:    General: Skin is warm and dry.  Neurological:     Mental Status: She is alert.     GCS: GCS eye subscore is 4. GCS verbal subscore is 5. GCS motor subscore is 6.  Psychiatric:        Speech: Speech normal.        Behavior: Behavior normal. Behavior is cooperative.    Results for orders placed or performed in visit on 09/07/19  POCT Urinalysis Dipstick (Automated)  Result Value Ref Range   Color, UA yellow    Clarity, UA clear    Glucose, UA Negative Negative   Bilirubin, UA Negative    Ketones, UA Negative    Spec Grav, UA 1.015 1.010 - 1.025   Blood, UA Negative    pH, UA 6.5 5.0 - 8.0   Protein, UA Negative Negative   Urobilinogen, UA 0.2 0.2 or 1.0 E.U./dL   Nitrite, UA Negative    Leukocytes, UA Small (1+) (A) Negative       Assessment and Plan:    Holly Phillips was seen today for unintentional weight loss.  Diagnoses and all orders for this visit:  Dysuria Symptoms have improved with time but will obtain UA and await urine culture to treat. Worsening precautions advised. -     Urine Culture -     POCT Urinalysis Dipstick (Automated)  Unintentional weight loss Weight has stabilized since last visit. I do anticipate her dysphagia is contributing to this. Will have her follow-up with me in 6 months, sooner if weight begins to decline.  Dysphagia, unspecified type Referral to GI placed today. -     Ambulatory referral to Gastroenterology   . Reviewed expectations re: course of current medical issues. . Discussed self-management of symptoms. . Outlined signs and symptoms indicating need for more acute intervention. . Patient verbalized understanding and all questions were answered. . See orders for this visit as documented in the electronic medical record. . Patient received an After Visit Summary.  CMA or LPN served as scribe during this visit. History, Physical, and Plan performed by medical provider. The above documentation has been reviewed and is accurate and complete.  Inda Coke, PA-C [facility] 09/07/2019  Follow-up: No follow-ups on file.

## 2019-09-07 NOTE — Patient Instructions (Signed)
It was great to see you!  I will be in touch with your urine results.  Someone will be in touch regarding your referral to the GASTROENTEROLOGIST.  Please call the PULMONOLOGIST at your earliest convenience and let them know that you'd like to schedule an appointment and that a referral has been placed for you: [phone]  Let's follow-up in 6 months, sooner if you have concerns.  Take care,  Inda Coke PA-C

## 2019-09-08 ENCOUNTER — Encounter: Payer: Self-pay | Admitting: Internal Medicine

## 2019-09-08 LAB — URINE CULTURE
MICRO NUMBER:: 10389816
SPECIMEN QUALITY:: ADEQUATE

## 2019-09-16 ENCOUNTER — Other Ambulatory Visit: Payer: Federal, State, Local not specified - PPO

## 2019-09-19 ENCOUNTER — Encounter (HOSPITAL_COMMUNITY): Payer: Federal, State, Local not specified - PPO

## 2019-09-22 NOTE — Progress Notes (Signed)
Referring Provider: Inda Coke, PA  Primary Care Physician:  Inda Coke, PA  Primary GI: Dr. Gala Romney  Patient Location: Home   Provider Location: [facility] office   Reason for Visit: Dysphagia    Persons present on the virtual encounter, with roles: Aliene Altes, PA-C (Provider); Educational psychologist (Patient)   Total time (minutes) spent on medical discussion: 18 minutes  Virtual Visit via Telephone Note Due to COVID-19, visit is conducted virtually and was requested by patient.   I connected with Kenleigh Lowery on 09/23/19 at  8:00 AM EDT by video and verified that I am speaking with the correct person using two identifiers.   I discussed the limitations, risks, security and privacy concerns of performing an evaluation and management service by telephone and the availability of in person appointments. I also discussed with the patient that there may be a patient responsible charge related to this service. The patient expressed understanding and agreed to proceed.  Chief Complaint  Patient presents with  . Dysphagia    back surgery few years ago, went through neck; swallowing got better after scar tissue was removed but difficulty has restarted    History of Present Illness: Holly Phillips is a 64 y.o. female presenting today at the request of Inda Coke, [place] for dysphagia.   Reviewed recent PCP note dated 09/07/2019.  Patient reported dysphagia with solids which is causing her not to eat much at home especially when alone.  Cannot tolerate breads, meats, salads without regurgitating food.  Symptoms are worsening.  She was referred to GI for further evaluation.  Today:   Had neck surgery 2012. Has a plate in her neck. Surgery completed by Dr. Valarie Merino in [place], [place].  Developed dysphagia thereafter and saw ENT (in [place] as well ) who ultimately "lasered" scar tissue.  This resolved dysphagia.  Reports this was well over 5 years ago.  Currently  with breads, meats, and some foods getting hung in her throat around the sternal notch.  States she will have to cough the food back up.  Embarrassed to go out to eat.  Reports being very careful home when eating by herself.  She is concerned about something getting stuck and being home alone.  No trouble with soft foods or liquids.  Reports symptoms are exact same as prior to having scar tissue removed. No prior EGD. Intentionally losing weight. States she needs to get to 120lbs. Currently abot 125 lbs. 5' tall  Taking omeprazole BID for GERD. Occasional breakthrough about twice a month. Will use rollaids as needed. No soda. Avoiding fried/fatty/greasy foods.   No N/V. No abdominal pain. No blood in the stool or black stool. BMs daily. Occasional constipation. Linzess as needed. This works well. Had colonoscopy in 2011 or 2012 in [place] at [place] she had polyps.   Past Medical History:  Diagnosis Date  . Anxiety   . Arthritis   . Asthma   . Chronic back pain   . Colon polyps   . COPD (chronic obstructive pulmonary disease) ([place])   . Depression   . Emphysema of lung [facility])      Past Surgical History:  Procedure Laterality Date  . ABDOMINAL HYSTERECTOMY    . CERVICAL SPINE SURGERY    . INNER EAR SURGERY    . LUMBAR FUSION  2012   L3-L7     Current Meds  Medication Sig  . albuterol (VENTOLIN HFA) 108 (90 Base) MCG/ACT inhaler Inhale 1-2 puffs into the lungs  every 6 (six) hours as needed for wheezing or shortness of breath.  Marland Kitchen atorvastatin (LIPITOR) 10 MG tablet Take 1 tablet (10 mg total) by mouth daily.  . celecoxib (CELEBREX) 100 MG capsule Take 1 capsule (100 mg total) by mouth daily.  . cyclobenzaprine (FLEXERIL) 10 MG tablet Take 1 tablet (10 mg total) by mouth 3 (three) times daily.  Marland Kitchen gabapentin (NEURONTIN) 800 MG tablet Take 1 tablet (800 mg total) by mouth 3 (three) times daily.  Marland Kitchen linaclotide (LINZESS) 290 MCG CAPS capsule Take 1 capsule (290 mcg  total) by mouth daily before breakfast.  . montelukast (SINGULAIR) 10 MG tablet Take 1 tablet (10 mg total) by mouth daily.  Marland Kitchen omeprazole (PRILOSEC) 40 MG capsule Take 1 capsule (40 mg total) by mouth 2 (two) times daily.  Marland Kitchen oxyCODONE-acetaminophen (PERCOCET) 10-325 MG tablet Take 1 tablet by mouth 4 (four) times daily as needed.  . traZODone (DESYREL) 150 MG tablet Take 150 mg by mouth at bedtime.  . TRINTELLIX 20 MG TABS tablet Take 1 tablet (20 mg total) by mouth daily.     Family History  Problem Relation Age of Onset  . Depression Mother   . Diabetes Mother   . Hypertension Mother   . Hyperlipidemia Mother   . Heart attack Mother   . Osteoarthritis Father   . Asthma Father   . COPD Father   . Breast cancer Sister   . Heart attack Maternal Grandmother   . Lupus Sister   . Osteoarthritis Sister   . Asthma Sister   . COPD Sister   . Diabetes Sister   . Drug abuse Sister   . Heart attack Sister   . Colon cancer Neg Hx   . Esophageal cancer Neg Hx     Social History   Socioeconomic History  . Marital status: Widowed    Spouse name: Not on file  . Number of children: Not on file  . Years of education: Not on file  . Highest education level: Not on file  Occupational History  . Not on file  Tobacco Use  . Smoking status: Current Every Day Smoker    Packs/day: 1.00    Types: Cigarettes  . Smokeless tobacco: Never Used  Substance and Sexual Activity  . Alcohol use: Not Currently  . Drug use: Not Currently    Types: Other-see comments    Comment: CBD and medical marijuana  . Sexual activity: Not Currently  Other Topics Concern  . Not on file  Social History Narrative   Lives with daughter and 25 y/o grandson   From [place], moved to [place] in 2019/07/21   Husband passed away in 08/17/2016   Social Determinants of Health   Financial Resource Strain:   . Difficulty of Paying Living Expenses:   Food Insecurity:   . Worried About Charity fundraiser in the Last Year:    . Arboriculturist in the Last Year:   Transportation Needs:   . Film/video editor (Medical):   Marland Kitchen Lack of Transportation (Non-Medical):   Physical Activity:   . Days of Exercise per Week:   . Minutes of Exercise per Session:   Stress:   . Feeling of Stress :   Social Connections:   . Frequency of Communication with Friends and Family:   . Frequency of Social Gatherings with Friends and Family:   . Attends Religious Services:   . Active Member of Clubs or Organizations:   . Attends Club  or Organization Meetings:   Marland Kitchen Marital Status:     Review of Systems: Gen: Denies fever, chills, lightheadedness, dizziness, presyncope, syncope. CV: Denies chest pain or palpitations. Resp: SOB with exertion. No SOB at rest. Chronic intermittent cough related to COPD. GI: see HPI Derm: Denies rash Psych: Admits to depression or anxiety. Heme: Bruises easily. No overt bleeding.   Observations/Objective: Alert and oriented, pleasant, well-developed, well-nourished, no distress.  Sclera clear without icterus.  Normal mood and affect.  Unable to perform complete physical exam due to video encounter.   Assessment and Plan: 64 year old female presenting for further evaluation of dysphagia.  She reports having anterior approach cervical spine surgery in 2012 in [place].  Developed dysphagia symptoms thereafter and had scar tissue lasered by ENT in [place] as well.  Had been doing well until recently.  Currently with breads, meats, and some fruits getting hung at the sternal notch requiring regurgitation.  Symptoms are exactly the same as they were prior to removal of scar tissue several years back.  GERD symptoms are well controlled on omeprazole twice daily.  No other significant upper or lower GI symptoms.  Symptoms may be secondary to recurrent scar tissue; however, cannot rule out esophageal web, ring, or stricture.  Less likely mass, but this is on the differential.  Discussed pursuing EGD versus  BPE for further evaluation of dysphagia.  Patient prefers to pursue BPE first as this is less invasive.  Ultimately, she may need EGD.   Plan: Proceed with BPE.  Continue eating soft textures.  Avoid tough meats.  All meats should be chopped finely.  Take small bites, eat slowly, chew well, drink plenty of fluids throughout female. Advised if something were to get hung in her esophagus and not come up or go down, she should proceed to the emergency room. Continue omeprazole 40 mg twice daily.  Be sure to take this 30 minutes before breakfast and dinner. Follow strict GERD diet.  Counseled on this. Further recommendations to follow BPE.  Follow Up Instructions: Further recommendations to follow BPE.   I discussed the assessment and treatment plan with the patient. The patient was provided an opportunity to ask questions and all were answered. The patient agreed with the plan and demonstrated an understanding of the instructions.   The patient was advised to call back or seek an in-person evaluation if the symptoms worsen or if the condition fails to improve as anticipated.  I provided 18 minutes of non-face-to-face time during this encounter.  Aliene Altes, PA-C [facility]

## 2019-09-23 ENCOUNTER — Telehealth: Payer: Self-pay

## 2019-09-23 ENCOUNTER — Encounter: Payer: Self-pay | Admitting: Gastroenterology

## 2019-09-23 ENCOUNTER — Other Ambulatory Visit: Payer: Self-pay

## 2019-09-23 ENCOUNTER — Other Ambulatory Visit (HOSPITAL_COMMUNITY)
Admission: RE | Admit: 2019-09-23 | Discharge: 2019-09-23 | Disposition: A | Discharge status: Home / Self Care | Payer: Medicare Other | Source: Ambulatory Visit | Attending: Physician Assistant | Admitting: Physician Assistant

## 2019-09-23 ENCOUNTER — Other Ambulatory Visit (HOSPITAL_COMMUNITY): Payer: Federal, State, Local not specified - PPO

## 2019-09-23 ENCOUNTER — Telehealth (INDEPENDENT_AMBULATORY_CARE_PROVIDER_SITE_OTHER): Payer: Medicare Other | Admitting: Gastroenterology

## 2019-09-23 DIAGNOSIS — R1310 Dysphagia, unspecified: Secondary | ICD-10-CM

## 2019-09-23 DIAGNOSIS — Z20822 Contact with and (suspected) exposure to covid-19: Secondary | ICD-10-CM | POA: Insufficient documentation

## 2019-09-23 DIAGNOSIS — Z01812 Encounter for preprocedural laboratory examination: Secondary | ICD-10-CM | POA: Insufficient documentation

## 2019-09-23 NOTE — Telephone Encounter (Signed)
Holly Phillips, you are scheduled for a virtual visit with your provider today.  Just as we do with appointments in the office, we must obtain your consent to participate.  Your consent will be active for this visit and any virtual visit you may have with one of our providers in the next 365 days.  If you have a MyChart account, I can also send a copy of this consent to you electronically.  All virtual visits are billed to your insurance company just like a traditional visit in the office.  As this is a virtual visit, video technology does not allow for your provider to perform a traditional examination.  This may limit your provider's ability to fully assess your condition.  If your provider identifies any concerns that need to be evaluated in person or the need to arrange testing such as labs, EKG, etc, we will make arrangements to do so.  Although advances in technology are sophisticated, we cannot ensure that it will always work on either your end or our end.  If the connection with a video visit is poor, we may have to switch to a telephone visit.  With either a video or telephone visit, we are not always able to ensure that we have a secure connection.   I need to obtain your verbal consent now.   Are you willing to proceed with your visit today?

## 2019-09-23 NOTE — Patient Instructions (Signed)
We scheduled for a barium pill esophagram in the very near future to help evaluate your swallowing difficulties.  Continue sticking to softer textures.  Avoid tough meats.  All meats should be chopped finely.  Be sure you are taking small bites, chewing well, and eating slowly.  Drink plenty of fluids throughout your meal.  If something were to get hung in your esophagus and not come up or go down, you should proceed to the emergency room.  We will call you with results and further recommendations.  Continue taking omeprazole 40 mg twice daily.  Be sure you are taking this 30 minutes before breakfast and dinner.  Be sure you are following a strict GERD diet.  Avoid fried, fatty, greasy, spicy, citrus foods.  Avoid caffeine and carbonated beverages.  Do not eat within 3 hours of laying down.  Aliene Altes, PA-C [facility]

## 2019-09-23 NOTE — Telephone Encounter (Signed)
Pt agreed to virtual visit.  

## 2019-09-23 NOTE — Telephone Encounter (Signed)
BPE scheduled for 09/28/19 at 10:00am, arrive at 9:45am. NPO 3 hours prior to test.  Called and informed pt of appt. Letter mailed.

## 2019-09-24 LAB — SARS CORONAVIRUS 2 (TAT 6-24 HRS): SARS Coronavirus 2: NEGATIVE

## 2019-09-26 ENCOUNTER — Encounter (HOSPITAL_BASED_OUTPATIENT_CLINIC_OR_DEPARTMENT_OTHER)
Admission: RE | Admit: 2019-09-26 | Discharge: 2019-09-26 | Disposition: A | Discharge status: Home / Self Care | Payer: Medicare Other | Source: Ambulatory Visit | Attending: Cardiovascular Disease | Admitting: Cardiovascular Disease

## 2019-09-26 ENCOUNTER — Other Ambulatory Visit: Payer: Self-pay

## 2019-09-26 ENCOUNTER — Encounter (HOSPITAL_COMMUNITY)
Admission: RE | Admit: 2019-09-26 | Discharge: 2019-09-26 | Disposition: A | Discharge status: Home / Self Care | Payer: Medicare Other | Source: Ambulatory Visit | Attending: Cardiovascular Disease | Admitting: Cardiovascular Disease

## 2019-09-26 DIAGNOSIS — R079 Chest pain, unspecified: Secondary | ICD-10-CM | POA: Diagnosis present

## 2019-09-26 LAB — NM MYOCAR MULTI W/SPECT W/WALL MOTION / EF
Estimated workload: 7 METS
Exercise duration (min): 7 min
Exercise duration (sec): 35 s
LV dias vol: 48 mL (ref 46–106)
LV sys vol: 11 mL
MPHR: 156 {beats}/min
Peak HR: 137 {beats}/min
Percent HR: 87 %
RATE: 0.3
RPE: 17
Rest HR: 82 {beats}/min
SDS: 0
SRS: 0
SSS: 0
TID: 0.98

## 2019-09-26 MED ORDER — REGADENOSON 0.4 MG/5ML IV SOLN
INTRAVENOUS | Status: AC
  Filled 2019-09-26: qty 5

## 2019-09-26 MED ORDER — TECHNETIUM TC 99M TETROFOSMIN IV KIT
10.0000 | PACK | Freq: Once | INTRAVENOUS | Status: AC | PRN
  Administered 2019-09-26: 9.7 via INTRAVENOUS

## 2019-09-26 MED ORDER — SODIUM CHLORIDE FLUSH 0.9 % IV SOLN
INTRAVENOUS | Status: AC
  Administered 2019-09-26: 11:00:00 10 mL via INTRAVENOUS
  Filled 2019-09-26: qty 10

## 2019-09-26 MED ORDER — TECHNETIUM TC 99M TETROFOSMIN IV KIT
30.0000 | PACK | Freq: Once | INTRAVENOUS | Status: AC | PRN
  Administered 2019-09-26: 11:00:00 30 via INTRAVENOUS

## 2019-09-28 ENCOUNTER — Ambulatory Visit (HOSPITAL_COMMUNITY)
Admission: RE | Admit: 2019-09-28 | Discharge: 2019-09-28 | Disposition: A | Discharge status: Home / Self Care | Payer: Medicare Other | Source: Ambulatory Visit | Attending: Gastroenterology | Admitting: Gastroenterology

## 2019-09-28 ENCOUNTER — Other Ambulatory Visit: Payer: Self-pay

## 2019-09-28 DIAGNOSIS — R1310 Dysphagia, unspecified: Secondary | ICD-10-CM | POA: Insufficient documentation

## 2019-09-28 IMAGING — RF DG ESOPHAGUS
7 series · 15 of 24 positions shown · non-contrast
Comparison: None.

CLINICAL DATA: Dysphagia.

EXAM:
ESOPHOGRAM / BARIUM SWALLOW / BARIUM TABLET STUDY
TECHNIQUE: Combined double contrast and single contrast examination performed
using effervescent crystals, thick barium liquid, and thin barium
liquid. The patient was observed with fluoroscopy swallowing a 13 mm
barium sulphate tablet.
FLUOROSCOPY TIME:  Radiation Exposure Index (if provided by the
fluoroscopic device): 15.4 mGy.

[Series 1: cp_standard · 0.27mm/px · 2 of 123 frames shown (1 of 7)]
[frame 19/123]
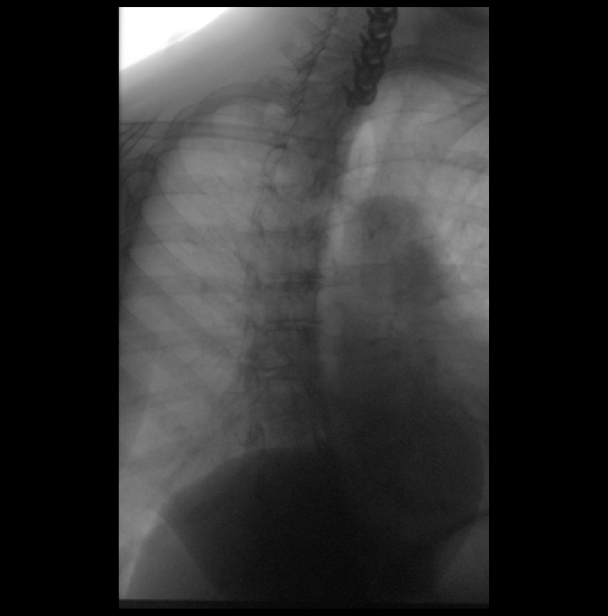
[frame 62/123]
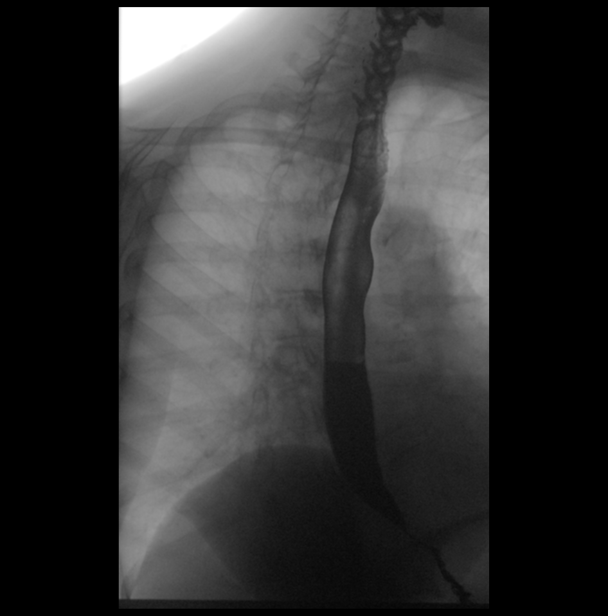

[Series 2: cp_standard · 0.27mm/px · 2 of 170 frames shown (2 of 7)]
[frame 26/170]
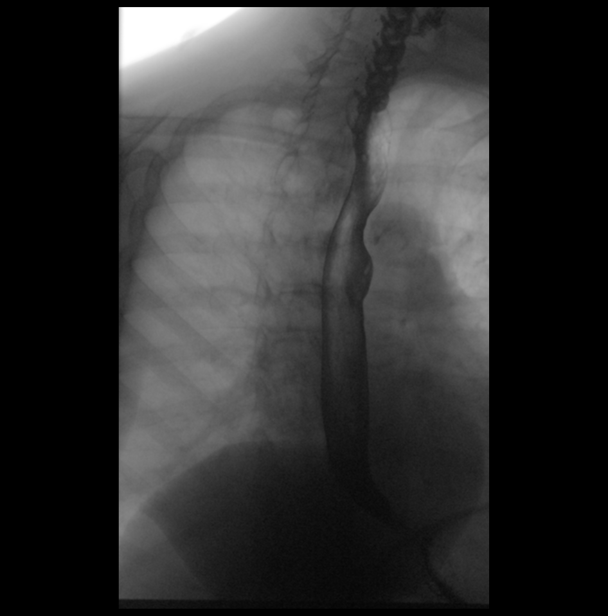
[frame 86/170]
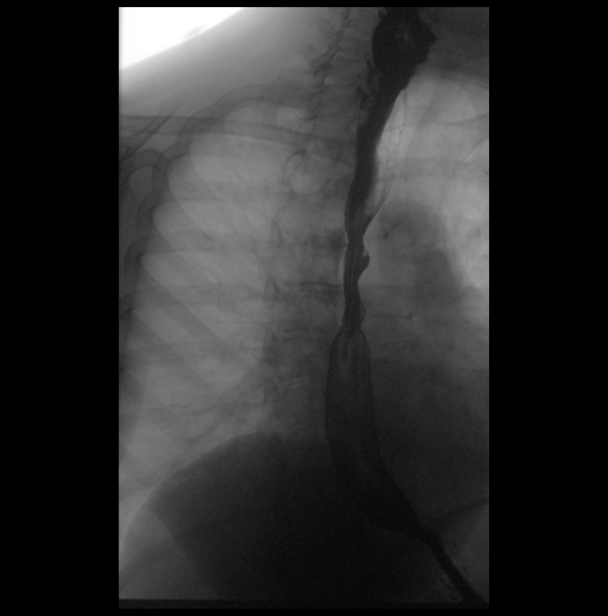

[Series 3: cp_standard · 0.27mm/px · 2 of 183 frames shown (3 of 7)]
[frame 28/183]
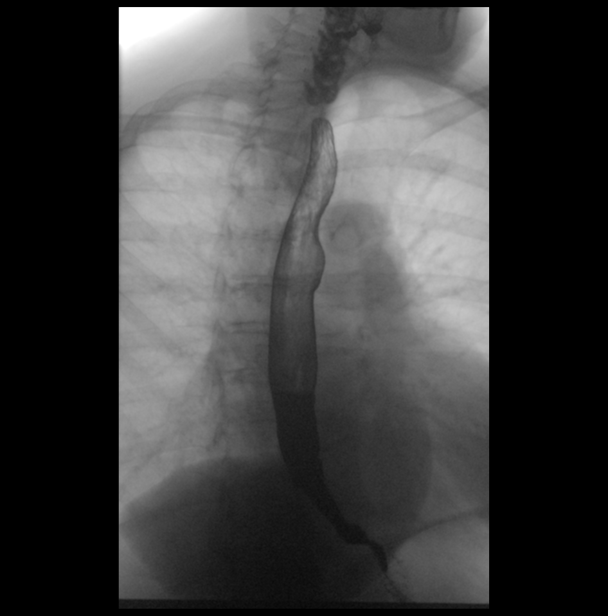
[frame 92/183]
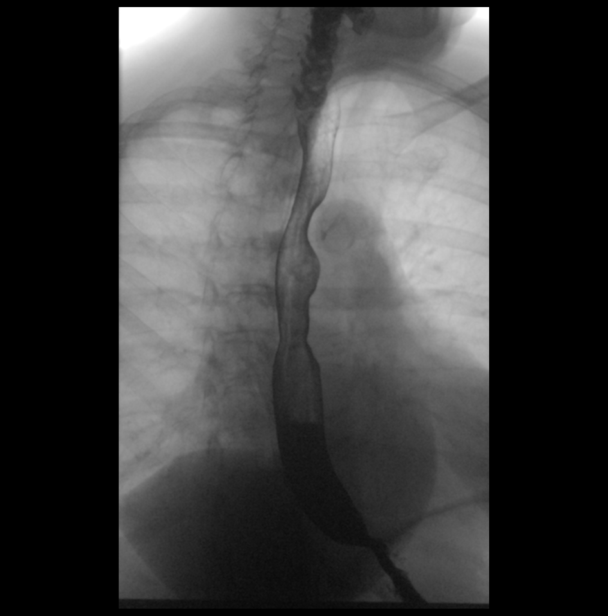

[Series 4: cp_standard · 0.28mm/px · 3 of 143 frames shown (4 of 7)]
[frame 4/143]
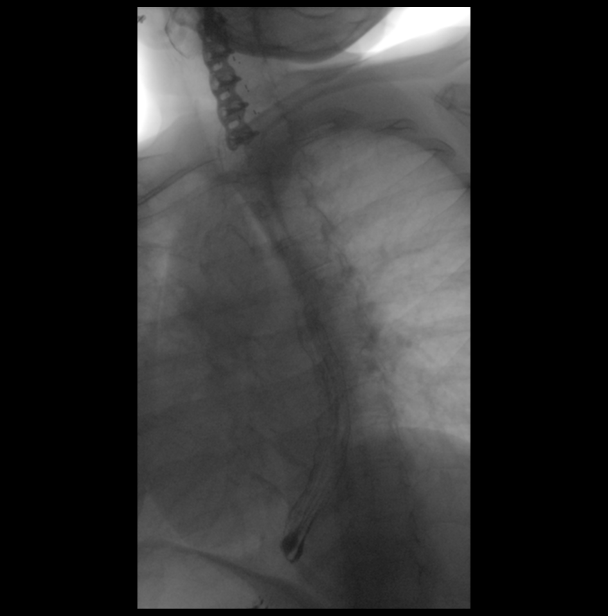
[frame 72/143]
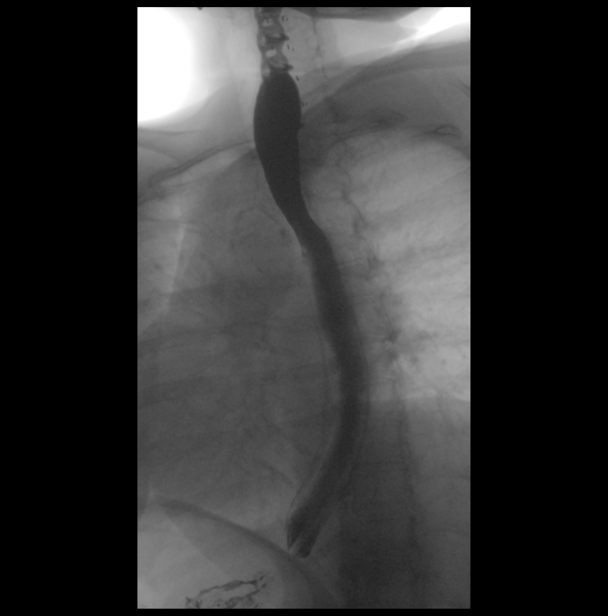
[frame 122/143]
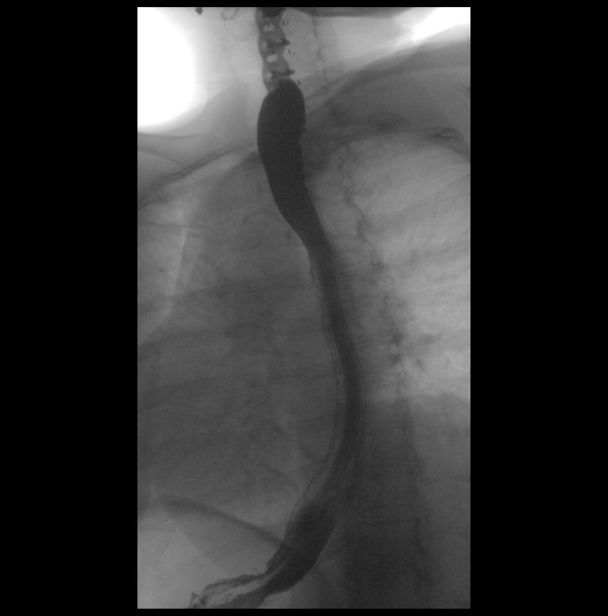

[Series 5: cp_standard · 0.28mm/px · 2 of 180 frames shown (5 of 7)]
[frame 137/180]
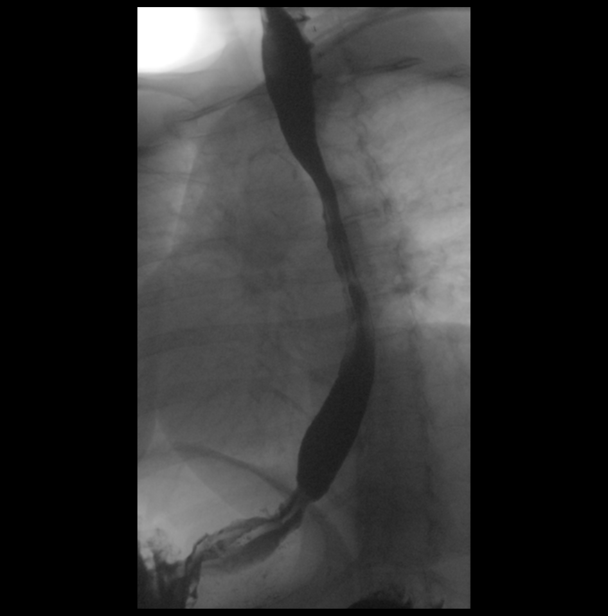
[frame 154/180]
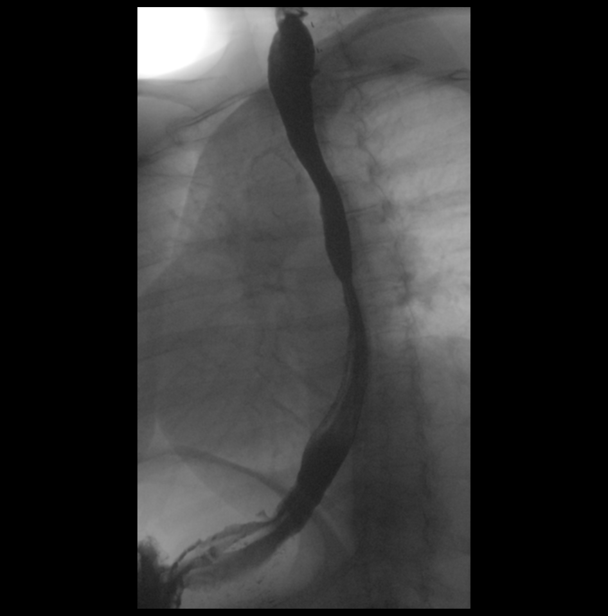

[Series 6: cp_standard · 0.28mm/px · 2 of 196 frames shown (6 of 7)]
[frame 32/196]
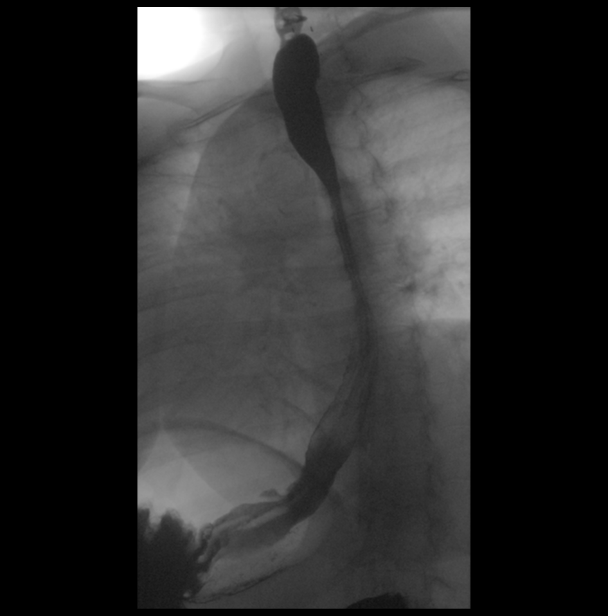
[frame 167/196]
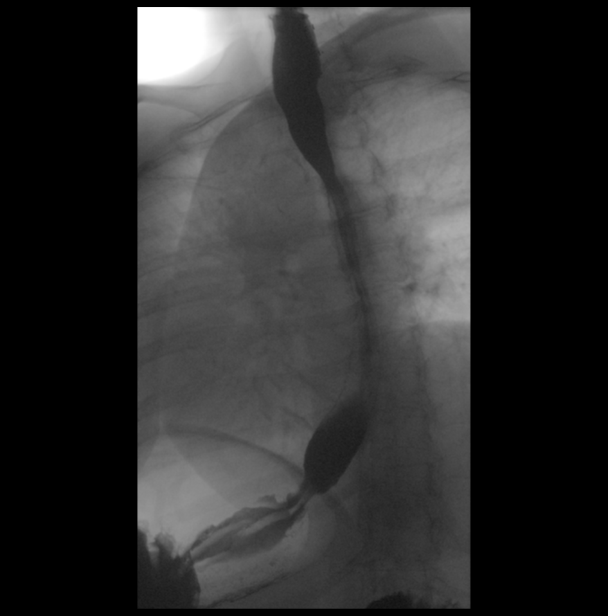

[Series 7: cp_standard · 0.28mm/px · 2 of 99 frames shown (7 of 7)]
[frame 50/99]
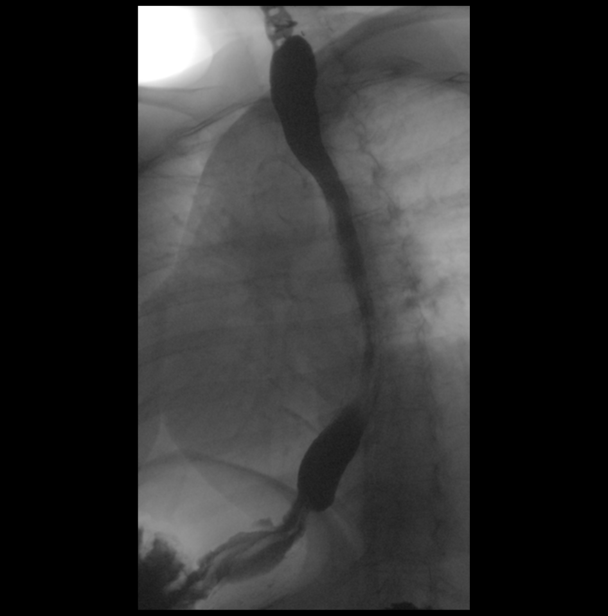
[frame 85/99]
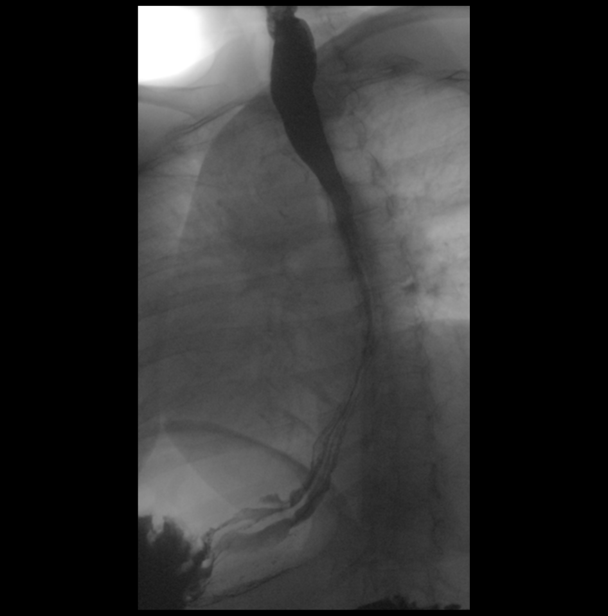

[15 of 24 positions shown; findings below may reference images not displayed]

FINDINGS: No mass or stricture is noted in the esophagus. No hiatal hernia or
reflux is noted. Barium tablet passed through esophagus into stomach
without difficulty or delay.
IMPRESSION: No abnormality seen in the esophagus.

## 2019-09-29 ENCOUNTER — Ambulatory Visit (INDEPENDENT_AMBULATORY_CARE_PROVIDER_SITE_OTHER): Payer: Medicare Other | Admitting: Pulmonary Disease

## 2019-09-29 ENCOUNTER — Encounter: Payer: Self-pay | Admitting: Pulmonary Disease

## 2019-09-29 VITALS — BP 160/82 | HR 104 | Ht 60.0 in | Wt 129.0 lb

## 2019-09-29 DIAGNOSIS — Z716 Tobacco abuse counseling: Secondary | ICD-10-CM | POA: Diagnosis not present

## 2019-09-29 DIAGNOSIS — G4733 Obstructive sleep apnea (adult) (pediatric): Secondary | ICD-10-CM

## 2019-09-29 DIAGNOSIS — J449 Chronic obstructive pulmonary disease, unspecified: Secondary | ICD-10-CM | POA: Diagnosis not present

## 2019-09-29 MED ORDER — BUDESONIDE-FORMOTEROL FUMARATE 160-4.5 MCG/ACT IN AERO
2.0000 | INHALATION_SPRAY | Freq: Two times a day (BID) | RESPIRATORY_TRACT | 6 refills | Status: DC
Start: 2019-09-29 — End: 2020-05-14

## 2019-09-29 NOTE — Progress Notes (Signed)
Esophagus looks completely normal on BPE. No mass or stricture noted. Barium tablet passed without difficulty or delay.   As patient reported dysphagia symptoms are the exact same as they had been prior to having scar tissue lasered in her neck by ENT several years ago following her anterior approach neck surgery, I feel she likely needs to see ENT again for further evaluation. We can place a referral to ENT for her if she would like. If they do not find anything significant, we will likely circle back to EGD with empiric dilation.   We can see her back after she is seen by ENT.

## 2019-09-29 NOTE — Progress Notes (Signed)
Pulmonary, Critical Care, and Sleep Medicine  Chief Complaint  Patient presents with  . Consult    Patient has shortness of breath with exertion. Patient has productive cough with brown thick sputum.    Constitutional:  BP (!) 160/82 (BP Location: Left Arm, Patient Position: Sitting, Cuff Size: Normal)   Pulse (!) 104   Ht 5' (1.524 m)   Wt 129 lb (58.5 kg)   LMP  (LMP Unknown)   SpO2 92%   BMI 25.19 kg/m   Past Medical History:  Anxiety, OA, Back pain, Colon polyps, Depression  Summary:  Holly Phillips is a 64 y.o. female smoker with COPD/emphysema, and obstructive sleep apnea.  Subjective:   She recently moved from [place] to [place] to live with family.  She was followed by pulmonary in [place] for COPD and sleep apnea.  She has CPAP and uses nightly.  She continues to smoke 1/2 to 1 ppd.  She tried quitting before and was off cigarettes for a year.  She gained a lot of weight and felt miserable.  She started smoking again and doesn't want to try quitting at this time.  She gets cough with brown sputum, especially in the morning.  Occasional wheezing.  Gets winded if she does too much house work.  Not having fever, chest pain, hemoptysis, or leg swelling.  Gets flu shot yearly and had pneumonia vaccine.  She is reluctant to get COVID vaccine - she is concerned the vaccines weren't sufficiently tested.  No history of pneumonia or TB.  She was previously using advair and this helped.  It was too expensive and she had to stop using advair.  She has been using ventolin and singulair.  Physical Exam:   Appearance - well kempt  ENMT - no sinus tenderness, clear nasal discharge, no oral exudate, Mallampati 3,   Respiratory - no wheeze, or rales  CV - regular rate and rhythm, no murmurs  GI - soft, non tender  Lymph - no adenopathy noted in neck  Ext - no edema  Skin - no rashes  Neuro - normal strength, oriented x 3  Psych - normal mood and  affect   Assessment/Plan:   COPD with emphysema and chronic bronchitis. - will have her try budesonide-formoterol bid - continue singulair and prn albuterol - will try to get copies of her PFT and chest imaging studies from [place]  Obstructive sleep apnea. - she reports compliance with CPAP and benefit from therapy - will try to get copy of office notes prior to have sleep study and sleep study results from [place] - after receive medical records can then set her up with DME in [place] abuse. - discussed importance of smoking cessation - she does not want to consider smoking cessation efforts at this time  COVID-19 advice. - advised her to keep an open mind about COVID 19 vaccination, especially as more safety data is collected   A total of 47 minutes addressing patient care on the day of the visit.  Follow up:  Patient Instructions  Budesonide-formoterol (symbicort) two puffs twice per day, and rinse mouth after each use  Will get copy of office records, breathing tests, chest xrays and CT scans, and sleep studies from [place]  Follow up in 2 months   Signature:  Chesley Mires, MD [place] Pager: [phone] 09/29/2019, 11:27 AM  Flow Sheet     Pulmonary tests:    Medications:   Allergies as of 09/29/2019  No Known Allergies     Medication List       Accurate as of Sep 29, 2019 11:27 AM. If you have any questions, ask your nurse or doctor.        albuterol 108 (90 Base) MCG/ACT inhaler Commonly known as: VENTOLIN HFA Inhale 1-2 puffs into the lungs every 6 (six) hours as needed for wheezing or shortness of breath.   atorvastatin 10 MG tablet Commonly known as: LIPITOR Take 1 tablet (10 mg total) by mouth daily.   budesonide-formoterol 160-4.5 MCG/ACT inhaler Commonly known as: SYMBICORT Inhale 2 puffs into the lungs 2 (two) times daily. Started by: Chesley Mires, MD   celecoxib 100 MG capsule Commonly known  as: CELEBREX Take 1 capsule (100 mg total) by mouth daily.   cyclobenzaprine 10 MG tablet Commonly known as: FLEXERIL Take 1 tablet (10 mg total) by mouth 3 (three) times daily.   gabapentin 800 MG tablet Commonly known as: NEURONTIN Take 1 tablet (800 mg total) by mouth 3 (three) times daily.   linaclotide 290 MCG Caps capsule Commonly known as: Linzess Take 1 capsule (290 mcg total) by mouth daily before breakfast.   montelukast 10 MG tablet Commonly known as: SINGULAIR Take 1 tablet (10 mg total) by mouth daily.   omeprazole 40 MG capsule Commonly known as: PRILOSEC Take 1 capsule (40 mg total) by mouth 2 (two) times daily.   oxyCODONE-acetaminophen 10-325 MG tablet Commonly known as: PERCOCET Take 1 tablet by mouth 4 (four) times daily as needed.   traZODone 150 MG tablet Commonly known as: DESYREL Take 150 mg by mouth at bedtime.   Trintellix 20 MG Tabs tablet Generic drug: vortioxetine HBr Take 1 tablet (20 mg total) by mouth daily.       Past Surgical History:  She  has a past surgical history that includes Abdominal hysterectomy; Lumbar fusion (2012); Inner ear surgery; and Cervical spine surgery.  Family History:  Her family history includes Asthma in her father and sister; Breast cancer in her sister; COPD in her father and sister; Depression in her mother; Diabetes in her mother and sister; Drug abuse in her sister; Heart attack in her maternal grandmother, mother, and sister; Hyperlipidemia in her mother; Hypertension in her mother; Lupus in her sister; Osteoarthritis in her father and sister.  Social History:  She  reports that she has been smoking cigarettes. She has been smoking about 1.00 pack per day. She has never used smokeless tobacco. She reports previous alcohol use. She reports previous drug use. Drug: Other-see comments.

## 2019-09-29 NOTE — Patient Instructions (Signed)
Budesonide-formoterol (symbicort) two puffs twice per day, and rinse mouth after each use  Will get copy of office records, breathing tests, chest xrays and CT scans, and sleep studies from [place]  Follow up in 2 months

## 2019-10-21 ENCOUNTER — Other Ambulatory Visit: Payer: Self-pay | Admitting: Physician Assistant

## 2019-10-22 ENCOUNTER — Other Ambulatory Visit: Payer: Self-pay | Admitting: Physician Assistant

## 2019-10-26 ENCOUNTER — Other Ambulatory Visit: Payer: Self-pay | Admitting: Pain Medicine

## 2019-10-26 DIAGNOSIS — R29898 Other symptoms and signs involving the musculoskeletal system: Secondary | ICD-10-CM

## 2019-10-26 DIAGNOSIS — Z981 Arthrodesis status: Secondary | ICD-10-CM

## 2019-10-26 DIAGNOSIS — M25511 Pain in right shoulder: Secondary | ICD-10-CM

## 2019-11-09 ENCOUNTER — Other Ambulatory Visit: Payer: Medicare Other

## 2019-11-10 ENCOUNTER — Ambulatory Visit
Admission: RE | Admit: 2019-11-10 | Discharge: 2019-11-10 | Disposition: A | Discharge status: Home / Self Care | Payer: Medicare Other | Source: Ambulatory Visit | Attending: Pain Medicine | Admitting: Pain Medicine

## 2019-11-10 ENCOUNTER — Other Ambulatory Visit: Payer: Self-pay

## 2019-11-10 DIAGNOSIS — M25511 Pain in right shoulder: Secondary | ICD-10-CM

## 2019-11-10 DIAGNOSIS — R29898 Other symptoms and signs involving the musculoskeletal system: Secondary | ICD-10-CM

## 2019-11-10 DIAGNOSIS — Z981 Arthrodesis status: Secondary | ICD-10-CM

## 2019-11-10 IMAGING — MR MR CERVICAL SPINE W/O CM
5 of 6 series · 35 of 48 positions shown · non-contrast
Comparison: None.

CLINICAL DATA: Neck pain radiating down both shoulders and
tingling.

EXAM:
MRI CERVICAL SPINE WITHOUT CONTRAST
TECHNIQUE: Multiplanar, multisequence MR imaging of the cervical spine was
performed. No intravenous contrast was administered.

[Series 4: T2 · sagittal · 3.0mm · 0.82mm/px · 7 of 15 slices shown (1 of 3)]
[im 1/15]
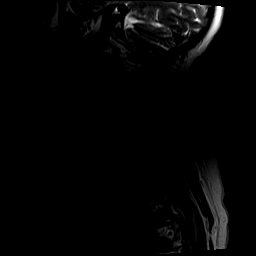
[im 3/15]
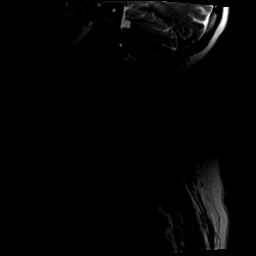
[im 5/15]
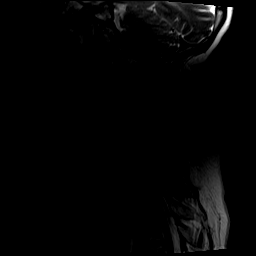
[im 8/15]
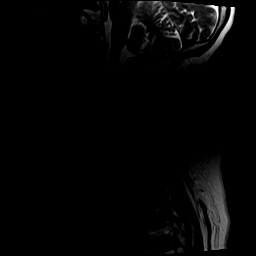
[im 10/15]
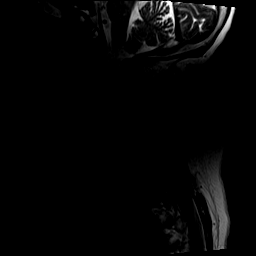
[im 12/15]
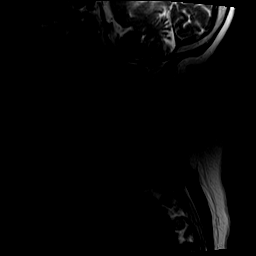
[im 15/15]
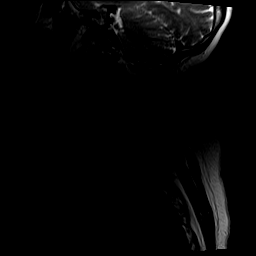

[Series 5: T1 · sagittal · 3.0mm · 0.41mm/px · 7 of 15 slices shown]
[im 1/15]
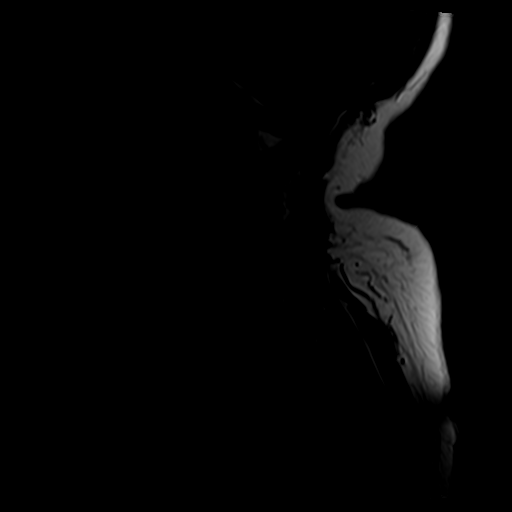
[im 3/15]
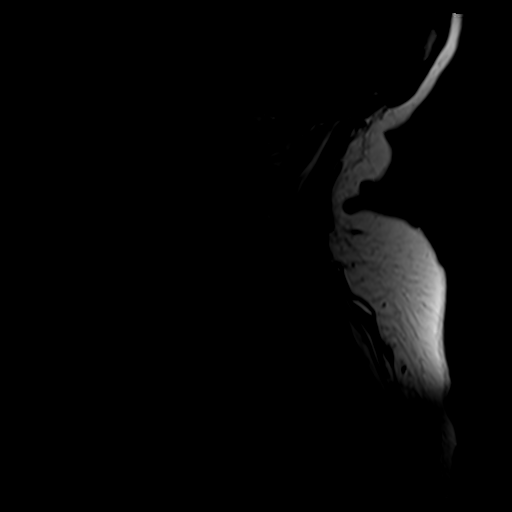
[im 5/15]
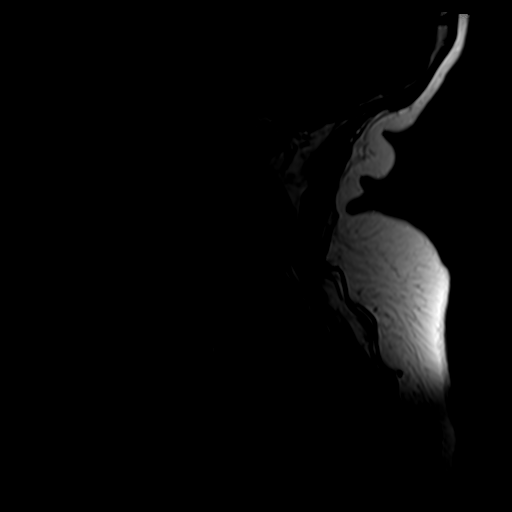
[im 8/15]
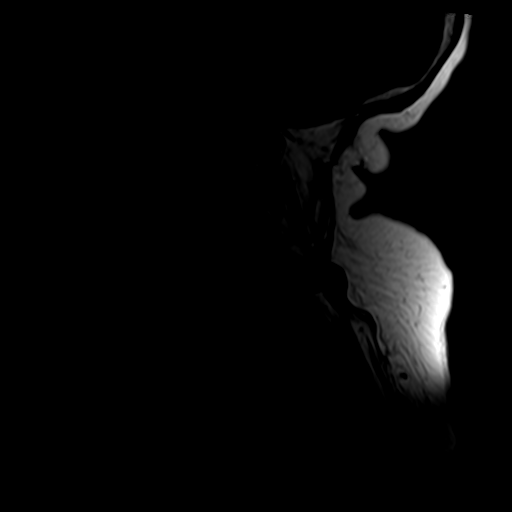
[im 10/15]
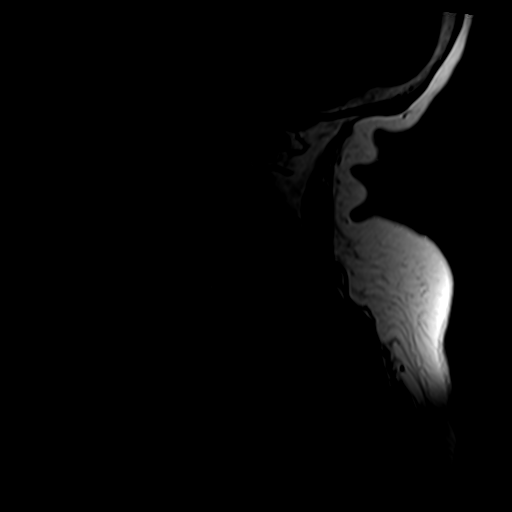
[im 12/15]
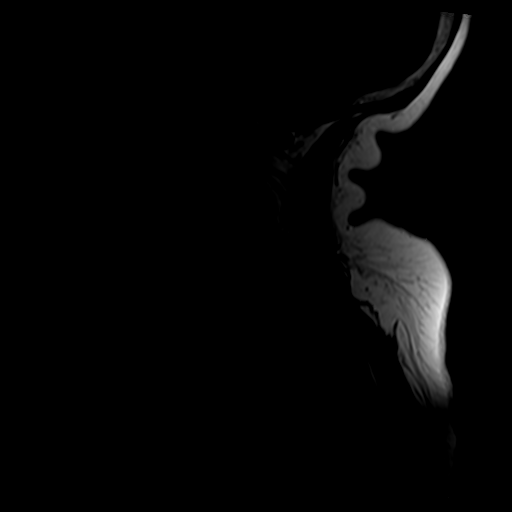
[im 15/15]
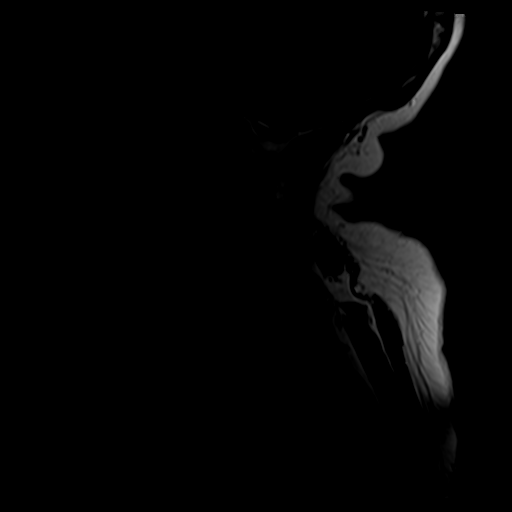

[Series 6: STIR · sagittal · 3.0mm · 0.41mm/px · 4 of 15 slices shown]
[im 1/15]
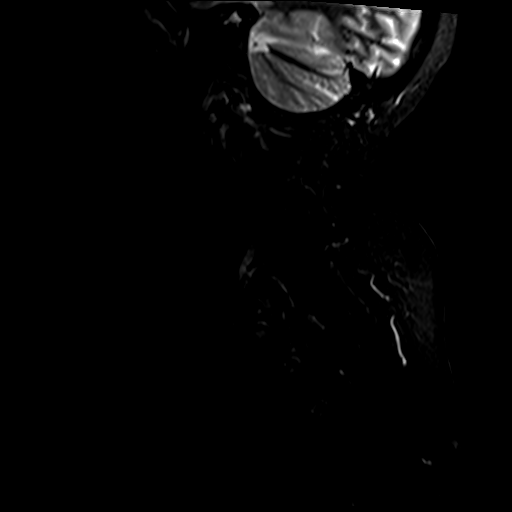
[im 3/15]
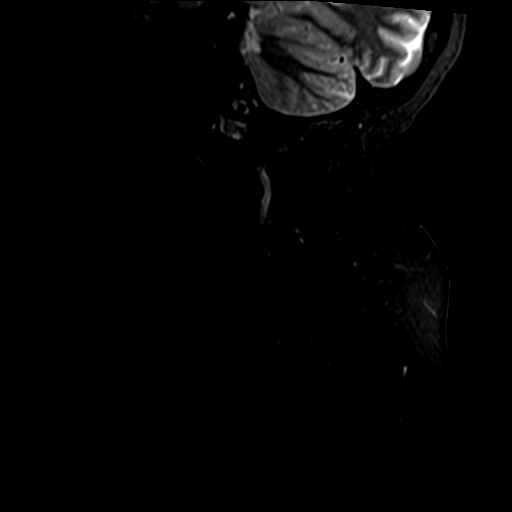
[im 6/15]
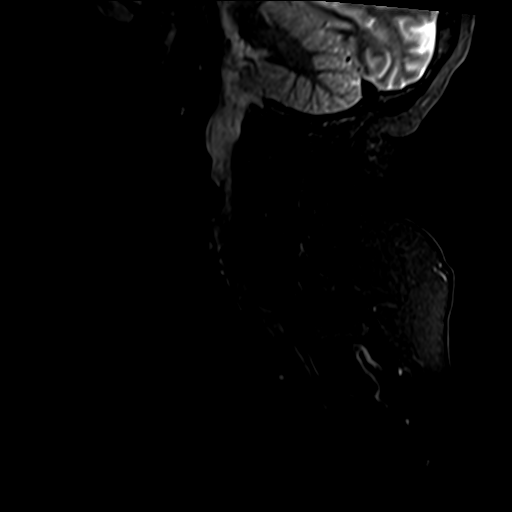
[im 9/15]
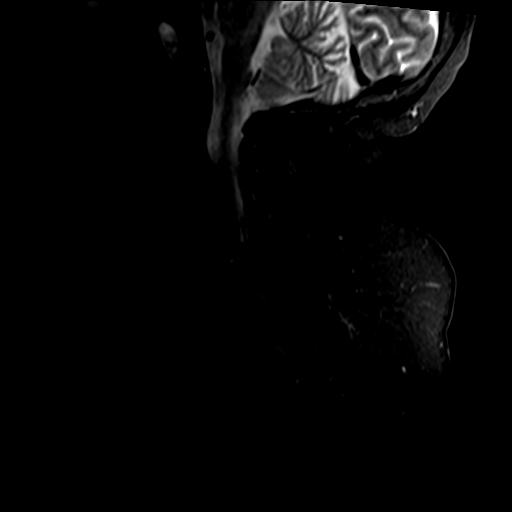

[Series 7: T2 · sagittal · 3.0mm · 0.82mm/px · 6 of 15 slices shown (2 of 3)]
[im 1/15]
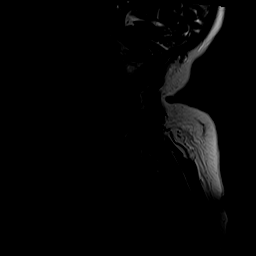
[im 3/15]
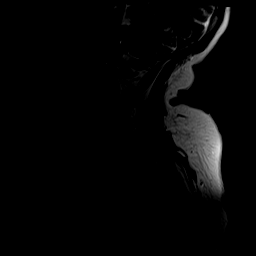
[im 6/15]
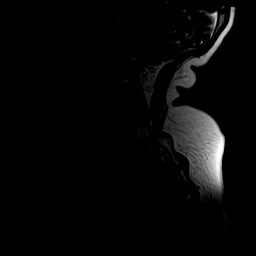
[im 9/15]
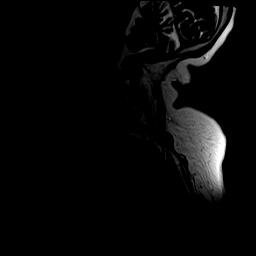
[im 12/15]
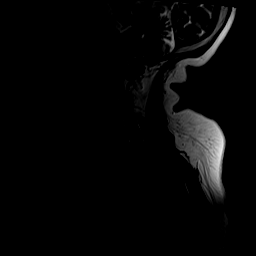
[im 15/15]
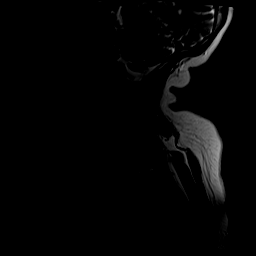

[Series 9: T2 · axial · 3.0mm · 0.70mm/px · z∈[-59,+86]mm · 11 of 26 slices shown (3 of 3)]
[im 1/26]
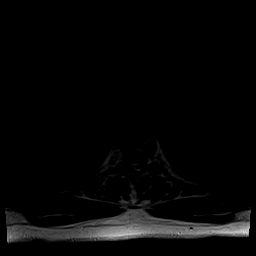
[im 3/26]
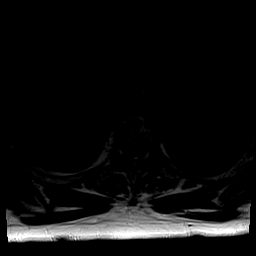
[im 6/26]
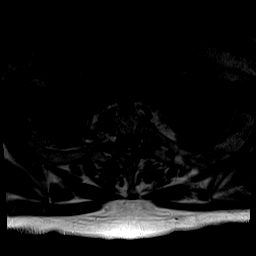
[im 8/26]
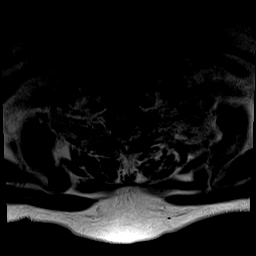
[im 11/26]
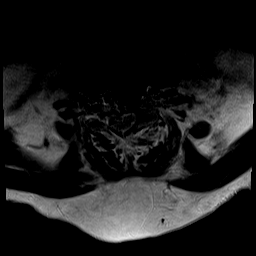
[im 13/26]
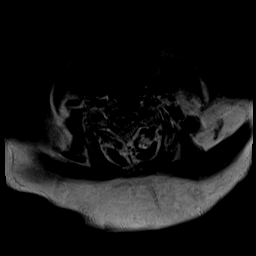
[im 16/26]
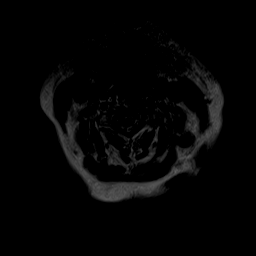
[im 18/26]
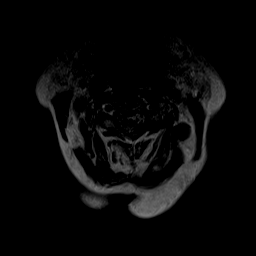
[im 21/26]
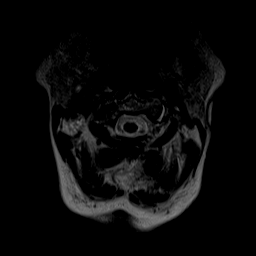
[im 23/26]
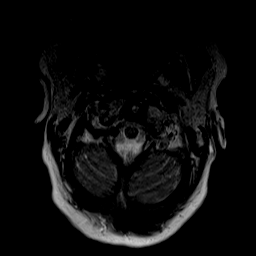
[im 26/26]
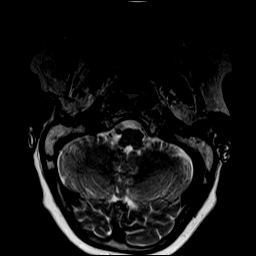

[35 of 48 positions shown; findings below may reference images not displayed]

FINDINGS: Patient motion degrades image quality limiting evaluation.

Alignment: 3 mm anterolisthesis of C7 on T1.

Vertebrae: No fracture, evidence of discitis, or bone lesion.

Cord: Normal signal and morphology.

Posterior Fossa, vertebral arteries, paraspinal tissues: Posterior
fossa demonstrates no focal abnormality. Vertebral artery flow voids
are maintained. Paraspinal soft tissues are unremarkable.

Disc levels:

Discs: Anterior cervical fusion from C3 through C7. Degenerative
disease with disc height loss at C7-T1.

C2-3: No significant disc bulge. Severe left facet arthropathy.
Severe left foraminal stenosis. No right foraminal stenosis. No
central canal stenosis.

C3-4: Interbody fusion. Severe left foraminal stenosis. No right
foraminal stenosis. No central canal stenosis.

C4-5: Interbody fusion. No neural foraminal stenosis. No central
canal stenosis.

C5-6: Interbody fusion. Mild-moderate bilateral foraminal narrowing,
but evaluation is limited secondary to patient motion. No central
canal stenosis.

C6-7: Interbody fusion. No neural foraminal stenosis. No central
canal stenosis.

C7-T1: Broad-based disc bulge. Severe bilateral facet arthropathy.
Mild right and severe left foraminal stenosis. No central canal
stenosis.

T2-3: Mild broad-based disc bulge. Moderate bilateral facet
arthropathy. Mild right foraminal stenosis.
IMPRESSION: 1. Anterior cervical fusion from C3 through C7. At C5-6 there is
mild-moderate bilateral foraminal narrowing, but evaluation is
limited secondary to patient motion. At C3-4 there is severe left
foraminal stenosis.
2. At C7-T1 there is a broad-based disc bulge. Severe bilateral
facet arthropathy. Mild right and severe left foraminal stenosis.
3. At C2-3 there is severe left facet arthropathy. Severe left
foraminal stenosis.

## 2019-12-12 ENCOUNTER — Ambulatory Visit: Payer: Federal, State, Local not specified - PPO | Admitting: Cardiovascular Disease

## 2019-12-15 NOTE — Progress Notes (Signed)
CARDIOLOGY CONSULT NOTE  Patient ID: Holly Phillips MRN: [id number] DOB/AGE: Oct 22, 1955 64 y.o.  Primary Physician: Inda Coke, PA  Reason for Consultation: Chest Pain / Family history of CAD   HPI: Holly Phillips is a 64 y.o. female referred for f/u by PA Morene Rankins Previously seen by Dr Bronson Ing She is a long time smoker with COPD. Originally from [place] in 2018 and now living with daughter She has GERD and is on PPI. Has had some atypical chest pains and family history of premature CAD. Most recent [facility]ovue done at [facility] was normal with no ischemia and EF 78% done on 09/26/19   She is a big [employer] fan. Watches all games with daughter   She continues to smoke ppd > 52 pack years Says she has gum / patches and is trying to cut back  She sees [facility] for pulmonary but has not seen recently   Has had vaccine for COVID Sees pain management for neck /back issues Has had issues with swallowing since anterior cervical neck surgery    Family history: Mother underwent CABG at age 48 and died at the age of 60.  Her sister had 2 coronary artery stents placed at the age of 53.   No Known Allergies  Current Outpatient Medications  Medication Sig Dispense Refill  . albuterol (VENTOLIN HFA) 108 (90 Base) MCG/ACT inhaler Inhale 1-2 puffs into the lungs every 6 (six) hours as needed for wheezing or shortness of breath. 18 g 5  . atorvastatin (LIPITOR) 10 MG tablet Take 1 tablet (10 mg total) by mouth daily. 90 tablet 1  . budesonide-formoterol (SYMBICORT) 160-4.5 MCG/ACT inhaler Inhale 2 puffs into the lungs 2 (two) times daily. 1 Inhaler 6  . celecoxib (CELEBREX) 100 MG capsule TAKE 1 CAPSULE(100 MG) BY MOUTH DAILY 90 capsule 0  . cyclobenzaprine (FLEXERIL) 10 MG tablet Take 1 tablet (10 mg total) by mouth 3 (three) times daily. 90 tablet 1  . escitalopram (LEXAPRO) 20 MG tablet Take 20 mg by mouth daily.    Marland Kitchen gabapentin (NEURONTIN) 800 MG  tablet Take 1 tablet (800 mg total) by mouth 3 (three) times daily. 270 tablet 1  . LINZESS 290 MCG CAPS capsule TAKE 1 CAPSULE(290 MCG) BY MOUTH DAILY BEFORE BREAKFAST (Patient taking differently: as needed. ) 30 capsule 1  . montelukast (SINGULAIR) 10 MG tablet Take 1 tablet (10 mg total) by mouth daily. 90 tablet 1  . nicotine (NICODERM CQ - DOSED IN MG/24 HOURS) 21 mg/24hr patch Place 1 patch (21 mg total) onto the skin daily. 28 patch 2  . omeprazole (PRILOSEC) 40 MG capsule Take 1 capsule (40 mg total) by mouth 2 (two) times daily. 90 capsule 1  . oxyCODONE-acetaminophen (PERCOCET) 10-325 MG tablet Take 1 tablet by mouth 4 (four) times daily as needed.    . traZODone (DESYREL) 150 MG tablet Take 150 mg by mouth at bedtime.     No current facility-administered medications for this visit.    Past Medical History:  Diagnosis Date  . Anxiety   . Arthritis   . Asthma   . Chronic back pain   . Colon polyps   . COPD (chronic obstructive pulmonary disease) ([place])   . Depression   . Emphysema of lung [facility])     Past Surgical History:  Procedure Laterality Date  . ABDOMINAL HYSTERECTOMY    . CERVICAL SPINE SURGERY    . INNER EAR SURGERY    .  LUMBAR FUSION  August 27, 2010   L3-L7    Social History   Socioeconomic History  . Marital status: Widowed    Spouse name: Not on file  . Number of children: Not on file  . Years of education: Not on file  . Highest education level: Not on file  Occupational History  . Not on file  Tobacco Use  . Smoking status: Current Every Day Smoker    Packs/day: 1.00    Types: Cigarettes  . Smokeless tobacco: Never Used  Vaping Use  . Vaping Use: Every day  Substance and Sexual Activity  . Alcohol use: Not Currently  . Drug use: Not Currently    Types: Other-see comments    Comment: CBD and medical marijuana  . Sexual activity: Not Currently  Other Topics Concern  . Not on file  Social History Narrative   Lives with daughter and 60 y/o grandson    From [place], moved to [place] in 07-30-19   Husband passed away in August 26, 2016   Social Determinants of Health   Financial Resource Strain:   . Difficulty of Paying Living Expenses:   Food Insecurity:   . Worried About Charity fundraiser in the Last Year:   . Arboriculturist in the Last Year:   Transportation Needs:   . Film/video editor (Medical):   Marland Kitchen Lack of Transportation (Non-Medical):   Physical Activity:   . Days of Exercise per Week:   . Minutes of Exercise per Session:   Stress:   . Feeling of Stress :   Social Connections:   . Frequency of Communication with Friends and Family:   . Frequency of Social Gatherings with Friends and Family:   . Attends Religious Services:   . Active Member of Clubs or Organizations:   . Attends Archivist Meetings:   Marland Kitchen Marital Status:   Intimate Partner Violence:   . Fear of Current or Ex-Partner:   . Emotionally Abused:   Marland Kitchen Physically Abused:   . Sexually Abused:       Current Meds  Medication Sig  . albuterol (VENTOLIN HFA) 108 (90 Base) MCG/ACT inhaler Inhale 1-2 puffs into the lungs every 6 (six) hours as needed for wheezing or shortness of breath.  Marland Kitchen atorvastatin (LIPITOR) 10 MG tablet Take 1 tablet (10 mg total) by mouth daily.  . budesonide-formoterol (SYMBICORT) 160-4.5 MCG/ACT inhaler Inhale 2 puffs into the lungs 2 (two) times daily.  . celecoxib (CELEBREX) 100 MG capsule TAKE 1 CAPSULE(100 MG) BY MOUTH DAILY  . cyclobenzaprine (FLEXERIL) 10 MG tablet Take 1 tablet (10 mg total) by mouth 3 (three) times daily.  Marland Kitchen escitalopram (LEXAPRO) 20 MG tablet Take 20 mg by mouth daily.  Marland Kitchen gabapentin (NEURONTIN) 800 MG tablet Take 1 tablet (800 mg total) by mouth 3 (three) times daily.  Marland Kitchen LINZESS 290 MCG CAPS capsule TAKE 1 CAPSULE(290 MCG) BY MOUTH DAILY BEFORE BREAKFAST (Patient taking differently: as needed. )  . montelukast (SINGULAIR) 10 MG tablet Take 1 tablet (10 mg total) by mouth daily.  . nicotine (NICODERM CQ -  DOSED IN MG/24 HOURS) 21 mg/24hr patch Place 1 patch (21 mg total) onto the skin daily.  Marland Kitchen omeprazole (PRILOSEC) 40 MG capsule Take 1 capsule (40 mg total) by mouth 2 (two) times daily.  Marland Kitchen oxyCODONE-acetaminophen (PERCOCET) 10-325 MG tablet Take 1 tablet by mouth 4 (four) times daily as needed.  . traZODone (DESYREL) 150 MG tablet Take 150 mg by mouth at bedtime.  Review of systems complete and found to be negative unless listed above in HPI  Affect appropriate COPD er  HEENT: normal Neck previous anterior cervical neck surgery  JVP normal no bruits no thyromegaly Lungs exp  wheezing and good diaphragmatic motion Heart:  S1/S2 no murmur, no rub, gallop or click PMI normal Abdomen: benighn, BS positve, no tenderness, no AAA no bruit.  No HSM or HJR Distal pulses intact with no bruits No edema Neuro non-focal Skin warm and dry No muscular weakness  ECG: 09/01/19 NSR normal ECG    Labs: Lab Results  Component Value Date/Time   K 4.6 08/03/2019 02:08 PM   BUN 12 08/03/2019 02:08 PM   CREATININE 0.69 08/03/2019 02:08 PM   ALT 9 08/01/2019 02:10 PM   TSH 0.59 08/01/2019 02:10 PM   HGB 13.4 08/01/2019 02:10 PM     Lipids: No results found for: LDLCALC, LDLDIRECT, CHOL, TRIG, HDL      ASSESSMENT AND PLAN:   1.  Chest Pain:  Atypical normal myovue 09/26/19 no ischemia EF 78% observe  2.  GERD:  Currently on omeprazole.  She does have dysphagia for solids.  3.  Tobacco use: She is a longtime smoker.Lung cancer screening CT ordered Clinical COPD Continue ventolin,      Symbicort and refer to Dr Melvyn Novas pulmonary Counseled on smoking cessation for < 10 minutes   4. HLD : continue statin labs with primary    Disposition: Follow up in  A year Lung cancer screening CT ordered   Jenkins Rouge MD [facility]   12/27/2019, 3:13 PM

## 2019-12-16 ENCOUNTER — Encounter: Payer: Self-pay | Admitting: Physician Assistant

## 2019-12-16 ENCOUNTER — Telehealth (INDEPENDENT_AMBULATORY_CARE_PROVIDER_SITE_OTHER): Payer: Medicare Other | Admitting: Physician Assistant

## 2019-12-16 VITALS — Ht 60.0 in | Wt 128.0 lb

## 2019-12-16 DIAGNOSIS — R413 Other amnesia: Secondary | ICD-10-CM | POA: Diagnosis not present

## 2019-12-16 DIAGNOSIS — Z720 Tobacco use: Secondary | ICD-10-CM

## 2019-12-16 MED ORDER — NICOTINE 21 MG/24HR TD PT24: 21.0000 mg | MEDICATED_PATCH | Freq: Every day | TRANSDERMAL | 2 refills | Status: DC

## 2019-12-16 NOTE — Progress Notes (Signed)
Virtual Visit via Video   I connected with Holly Phillips on 12/16/19 at 12:30 PM EDT by a video enabled telemedicine application and verified that I am speaking with the correct person using two identifiers. Location patient: Home Location provider: [facility], Office Persons participating in the virtual visit: Holly Phillips, Holly Coke PA-C, Holly Pickler, LPN   I discussed the limitations of evaluation and management by telemedicine and the availability of in person appointments. The patient expressed understanding and agreed to proceed.  I acted as a Education administrator for [employer], [employer], LPN   Subjective:   HPI:   Memory loss Pt c/o memory loss x 1 year or more, worse past 4 months. Pt says she forgets food is on the stove, misplacing things, forgets where she is going and then uses GPS, forgetting to take medications on time. She is having lightheadedness if she is on her feet for too long, her legs get weak from standing.  She also endorses some weakness in hands and dropping things. Seeing pain management for this.   She continues to sees pain management for her medications.  She feels overall well controlled with her pain.  She denies falls, confusion, slurred speech, weakness on one side of her body.  Tobacco counseling Pt would like to discuss nicotine patch to help quit smoking. She is not interested in Chantix. She is currently smoking 1 pack/day.   ROS: See pertinent positives and negatives per HPI.  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/25/2019   Dysphagia 08/25/2019   Tobacco abuse 08/01/2019   Emphysema of lung ([place]) 08/01/2019   Arthritis 08/01/2019   History of lumbar fusion 08/01/2019   Recurrent depression ([place]) 08/01/2019   Anxiety 08/01/2019   Hyperlipidemia 08/01/2019   Constipation 08/01/2019   Gastroesophageal reflux disease  08/01/2019   Family history of heart disease 08/01/2019    Social History   Tobacco Use   Smoking status: Current Every Day Smoker    Packs/day: 1.00    Types: Cigarettes   Smokeless tobacco: Never Used  Substance Use Topics   Alcohol use: Not Currently    Current Outpatient Medications:    albuterol (VENTOLIN HFA) 108 (90 Base) MCG/ACT inhaler, Inhale 1-2 puffs into the lungs every 6 (six) hours as needed for wheezing or shortness of breath., Disp: 18 g, Rfl: 5   atorvastatin (LIPITOR) 10 MG tablet, Take 1 tablet (10 mg total) by mouth daily., Disp: 90 tablet, Rfl: 1   budesonide-formoterol (SYMBICORT) 160-4.5 MCG/ACT inhaler, Inhale 2 puffs into the lungs 2 (two) times daily., Disp: 1 Inhaler, Rfl: 6   celecoxib (CELEBREX) 100 MG capsule, TAKE 1 CAPSULE(100 MG) BY MOUTH DAILY, Disp: 90 capsule, Rfl: 0   cyclobenzaprine (FLEXERIL) 10 MG tablet, Take 1 tablet (10 mg total) by mouth 3 (three) times daily., Disp: 90 tablet, Rfl: 1   escitalopram (LEXAPRO) 20 MG tablet, Take 20 mg by mouth daily., Disp: , Rfl:    LINZESS 290 MCG CAPS capsule, TAKE 1 CAPSULE(290 MCG) BY MOUTH DAILY BEFORE BREAKFAST (Patient taking differently: as needed. ), Disp: 30 capsule, Rfl: 1   montelukast (SINGULAIR) 10 MG tablet, Take 1 tablet (10 mg total) by mouth daily., Disp: 90 tablet, Rfl: 1   omeprazole (PRILOSEC) 40 MG capsule, Take 1 capsule (40 mg total) by mouth 2 (two) times daily., Disp: 90 capsule, Rfl: 1   oxyCODONE-acetaminophen (PERCOCET) 10-325 MG tablet,  Take 1 tablet by mouth 4 (four) times daily as needed., Disp: , Rfl:    traZODone (DESYREL) 150 MG tablet, Take 150 mg by mouth at bedtime., Disp: , Rfl:    gabapentin (NEURONTIN) 800 MG tablet, Take 1 tablet (800 mg total) by mouth 3 (three) times daily., Disp: 270 tablet, Rfl: 1   nicotine (NICODERM CQ - DOSED IN MG/24 HOURS) 21 mg/24hr patch, Place 1 patch (21 mg total) onto the skin daily., Disp: 28 patch, Rfl: 2  No Known  Allergies  Objective:   VITALS: Per patient if applicable, see vitals. GENERAL: Alert, appears well and in no acute distress. HEENT: Atraumatic, conjunctiva clear, no obvious abnormalities on inspection of external nose and ears. NECK: Normal movements of the head and neck. CARDIOPULMONARY: No increased WOB. Speaking in clear sentences. I:E ratio WNL.  MS: Moves all visible extremities without noticeable abnormality. PSYCH: Pleasant and cooperative, well-groomed. Speech normal rate and rhythm. Affect is appropriate. Insight and judgement are appropriate. Attention is focused, linear, and appropriate.  NEURO: CN grossly intact. Oriented as arrived to appointment on time with no prompting. Moves both UE equally.  SKIN: No obvious lesions, wounds, erythema, or cyanosis noted on face or hands.  Assessment and Plan:   [name] was seen today for memory loss and tobacco counseling.  Diagnoses and all orders for this visit:  Memory loss Urgent referral to neurology for further evaluation, and person evaluation and management of her symptoms.  No red flags on discussion, however did review of symptoms worsen or she develops any strokelike symptoms to proceed to the emergency room immediately. -     Ambulatory referral to Neurology  Tobacco abuse Counseled.  She would like to trial nicotine patches.  I have sent these in for her.  Continue to provide emotional support.  Other orders -     nicotine (NICODERM CQ - DOSED IN MG/24 HOURS) 21 mg/24hr patch; Place 1 patch (21 mg total) onto the skin daily.   Reviewed expectations re: course of current medical issues.  Discussed self-management of symptoms.  Outlined signs and symptoms indicating need for more acute intervention.  Patient verbalized understanding and all questions were answered.  Health Maintenance issues including appropriate healthy diet, exercise, and smoking avoidance were discussed with patient.  See orders for this  visit as documented in the electronic medical record.  I discussed the assessment and treatment plan with the patient. The patient was provided an opportunity to ask questions and all were answered. The patient agreed with the plan and demonstrated an understanding of the instructions.   The patient was advised to call back or seek an in-person evaluation if the symptoms worsen or if the condition fails to improve as anticipated.   CMA or LPN served as scribe during this visit. History, Physical, and Plan performed by medical provider. The above documentation has been reviewed and is accurate and complete.   [place], [place] 12/16/2019

## 2019-12-27 ENCOUNTER — Ambulatory Visit (INDEPENDENT_AMBULATORY_CARE_PROVIDER_SITE_OTHER): Payer: Medicare Other | Admitting: Cardiovascular Disease

## 2019-12-27 ENCOUNTER — Encounter: Payer: Self-pay | Admitting: Cardiovascular Disease

## 2019-12-27 ENCOUNTER — Other Ambulatory Visit: Payer: Self-pay

## 2019-12-27 VITALS — BP 140/70 | HR 88 | Ht 60.0 in | Wt 131.0 lb

## 2019-12-27 DIAGNOSIS — R079 Chest pain, unspecified: Secondary | ICD-10-CM

## 2019-12-27 DIAGNOSIS — F17200 Nicotine dependence, unspecified, uncomplicated: Secondary | ICD-10-CM | POA: Diagnosis not present

## 2019-12-27 DIAGNOSIS — J410 Simple chronic bronchitis: Secondary | ICD-10-CM | POA: Diagnosis not present

## 2019-12-27 DIAGNOSIS — F17219 Nicotine dependence, cigarettes, with unspecified nicotine-induced disorders: Secondary | ICD-10-CM | POA: Diagnosis not present

## 2019-12-27 NOTE — Patient Instructions (Signed)
Medication Instructions:  Your physician recommends that you continue on your current medications as directed. Please refer to the Current Medication list given to you today.  *If you need a refill on your cardiac medications before your next appointment, please call your pharmacy*   Lab Work: bmet 1 week before chest CT If you have labs (blood work) drawn today and your tests are completely normal, you will receive your results only by: Marland Kitchen MyChart Message (if you have MyChart) OR . A paper copy in the mail If you have any lab test that is abnormal or we need to change your treatment, we will call you to review the results.   Testing/Procedures: Please schedule Chest CT for lung cancer screen   Follow-Up: At [facility], you and your health needs are our priority.  As part of our continuing mission to provide you with exceptional heart care, we have created designated Provider Care Teams.  These Care Teams include your primary Cardiologist (physician) and Advanced Practice Providers (APPs -  Physician Assistants and Nurse Practitioners) who all work together to provide you with the care you need, when you need it.  We recommend signing up for the patient portal called "MyChart".  Sign up information is provided on this After Visit Summary.  MyChart is used to connect with patients for Virtual Visits (Telemedicine).  Patients are able to view lab/test results, encounter notes, upcoming appointments, etc.  Non-urgent messages can be sent to your provider as well.   To learn more about what you can do with MyChart, go to [email or web address].    Your next appointment:   12 month(s)  The format for your next appointment:   In Person  Provider:   Jenkins Rouge, MD   Other Instructions None

## 2019-12-28 ENCOUNTER — Other Ambulatory Visit: Payer: Self-pay | Admitting: Physician Assistant

## 2020-01-05 ENCOUNTER — Other Ambulatory Visit: Payer: Self-pay | Admitting: Physician Assistant

## 2020-01-18 ENCOUNTER — Other Ambulatory Visit (HOSPITAL_COMMUNITY)
Admission: RE | Admit: 2020-01-18 | Discharge: 2020-01-18 | Disposition: A | Discharge status: Home / Self Care | Payer: Medicare Other | Source: Ambulatory Visit | Attending: Cardiovascular Disease | Admitting: Cardiovascular Disease

## 2020-01-18 ENCOUNTER — Other Ambulatory Visit: Payer: Self-pay

## 2020-01-18 DIAGNOSIS — R079 Chest pain, unspecified: Secondary | ICD-10-CM | POA: Insufficient documentation

## 2020-01-18 DIAGNOSIS — J410 Simple chronic bronchitis: Secondary | ICD-10-CM | POA: Diagnosis present

## 2020-01-18 DIAGNOSIS — F17219 Nicotine dependence, cigarettes, with unspecified nicotine-induced disorders: Secondary | ICD-10-CM | POA: Insufficient documentation

## 2020-01-18 LAB — BASIC METABOLIC PANEL
Anion gap: 11 (ref 5–15)
BUN: 8 mg/dL (ref 8–23)
CO2: 29 mmol/L (ref 22–32)
Calcium: 9.3 mg/dL (ref 8.9–10.3)
Chloride: 99 mmol/L (ref 98–111)
Creatinine, Ser: 0.74 mg/dL (ref 0.44–1.00)
GFR calc Af Amer: >60 mL/min (ref >60)
GFR calc non Af Amer: >60 mL/min (ref >60)
Glucose, Bld: 108 mg/dL — ABNORMAL HIGH (ref 70–99)
Potassium: 4.5 mmol/L (ref 3.5–5.1)
Sodium: 139 mmol/L (ref 135–145)

## 2020-01-19 ENCOUNTER — Other Ambulatory Visit: Payer: Self-pay

## 2020-01-19 ENCOUNTER — Ambulatory Visit (HOSPITAL_COMMUNITY)
Admission: RE | Admit: 2020-01-19 | Discharge: 2020-01-19 | Disposition: A | Discharge status: Home / Self Care | Payer: Medicare Other | Source: Ambulatory Visit | Attending: Cardiovascular Disease | Admitting: Cardiovascular Disease

## 2020-01-19 DIAGNOSIS — F17219 Nicotine dependence, cigarettes, with unspecified nicotine-induced disorders: Secondary | ICD-10-CM | POA: Diagnosis present

## 2020-01-19 IMAGING — CT CT CHEST LUNG CANCER SCREENING LOW DOSE W/O CM
2 of 4 series · 15 of 40 positions shown, 18 images · non-contrast
Comparison: None.

CLINICAL DATA: Fifty-two pack-year smoking history. Quit 2 weeks
ago.

EXAM:
CT CHEST WITHOUT CONTRAST LOW-DOSE FOR LUNG CANCER SCREENING
TECHNIQUE: Multidetector CT imaging of the chest was performed following the
standard protocol without IV contrast.

[Series 2: axial st · axial · 0.73mm/px · z∈[-298,-74]mm · 12 of 55 slices shown, 15 images]
[im 5/55  mediastinal]
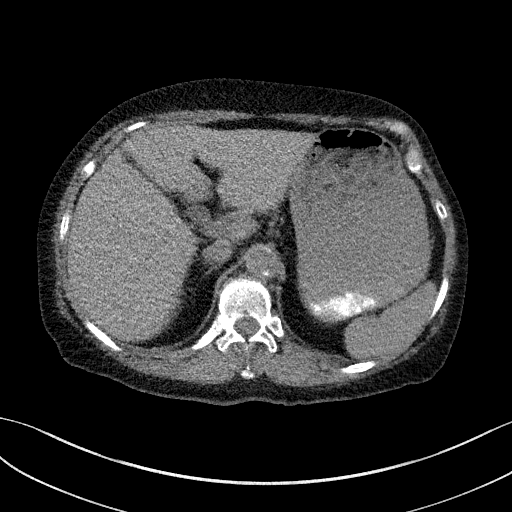
[im 5/55  lung]
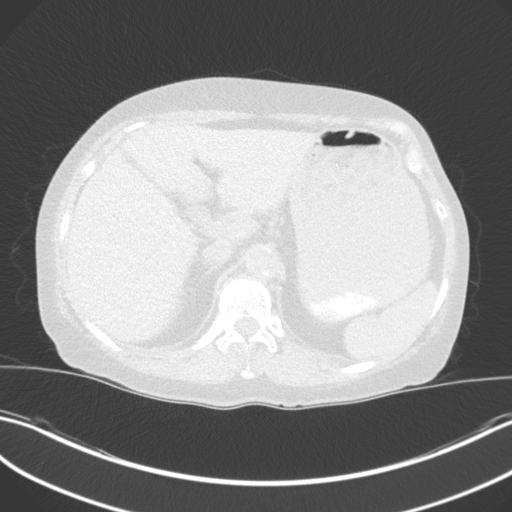
[im 9/55  lung]
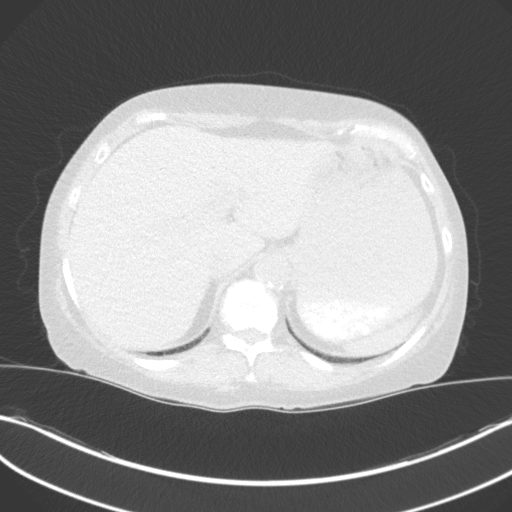
[im 13/55  lung]
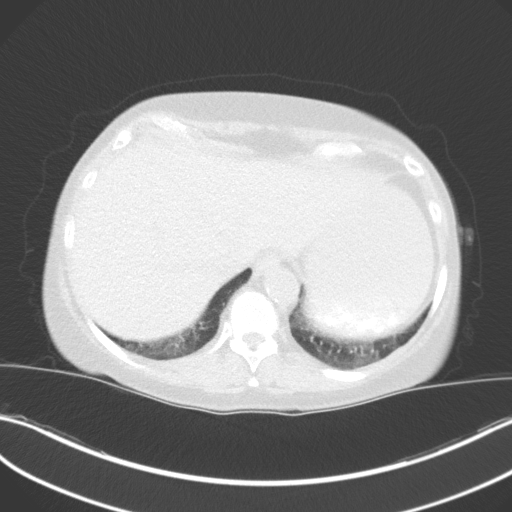
[im 17/55  lung]
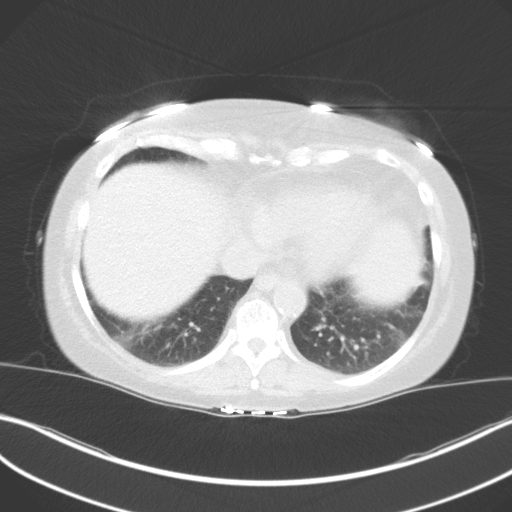
[im 21/55  mediastinal]
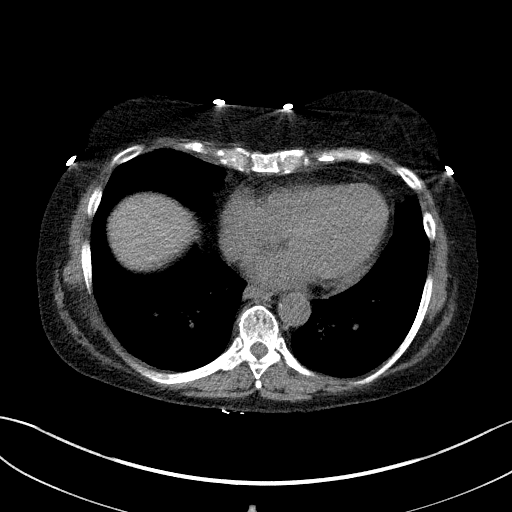
[im 21/55  lung]
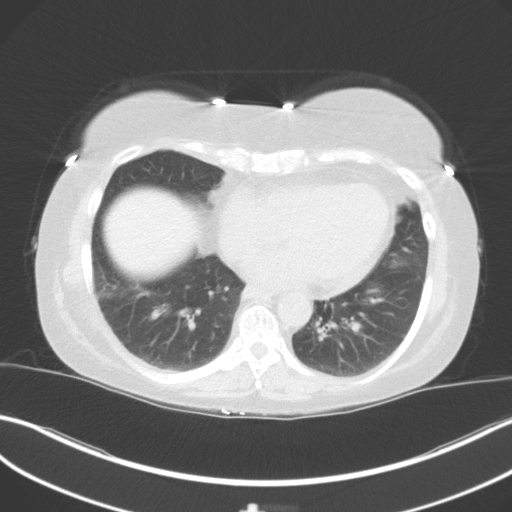
[im 25/55  lung]
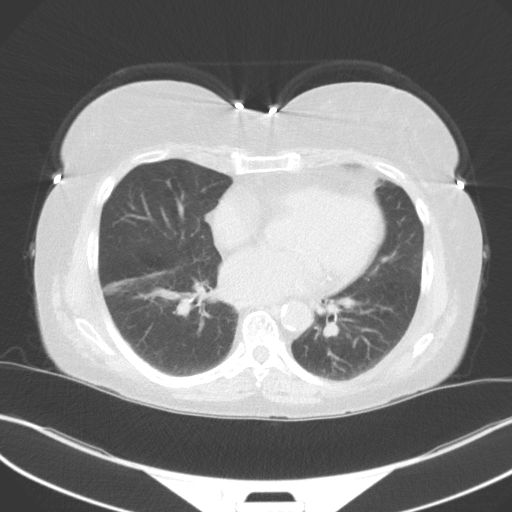
[im 30/55  lung]
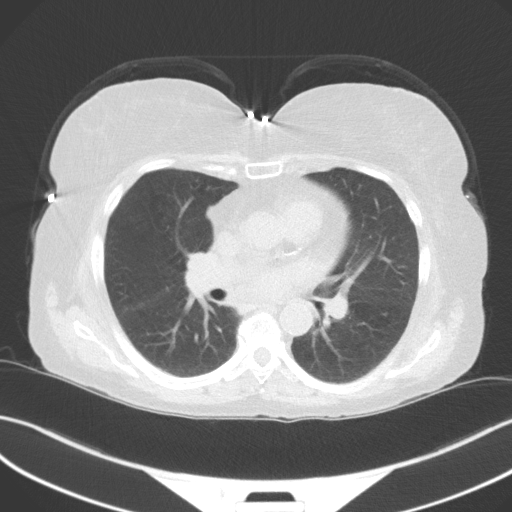
[im 34/55  lung]
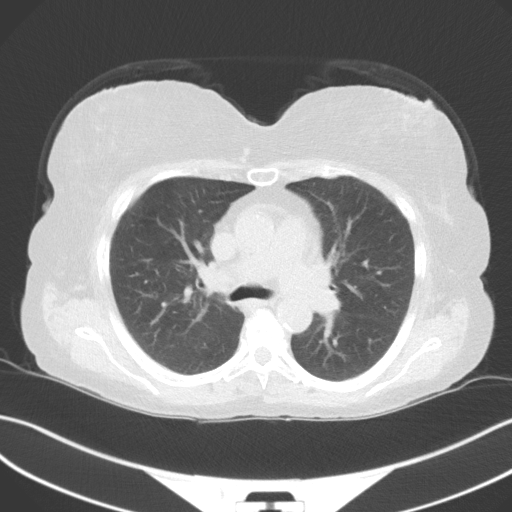
[im 38/55  mediastinal]
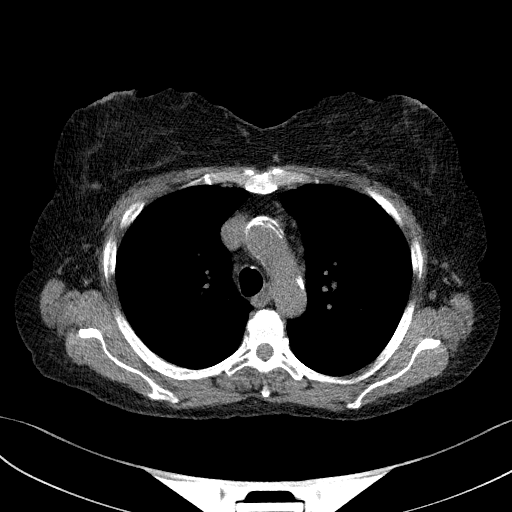
[im 38/55  lung]
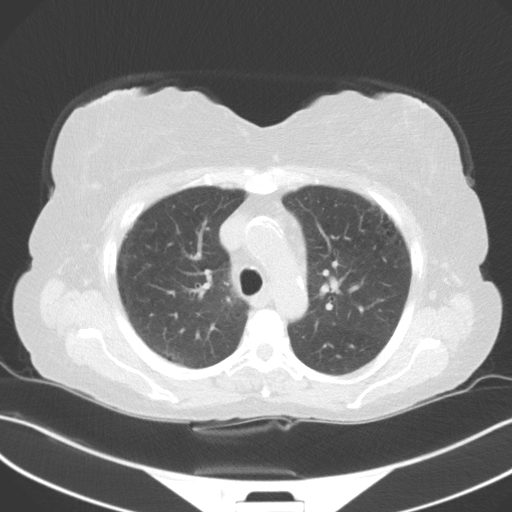
[im 42/55  lung]
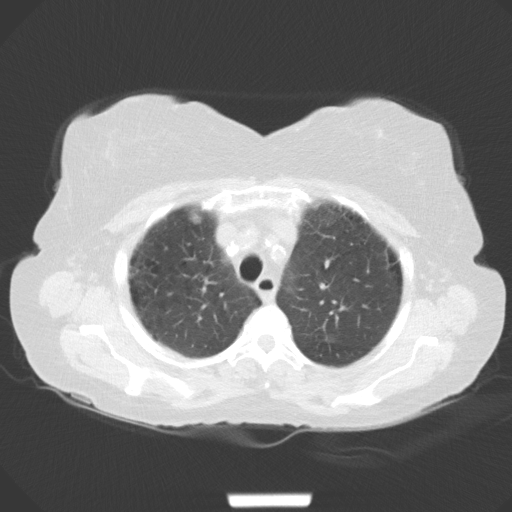
[im 46/55  lung]
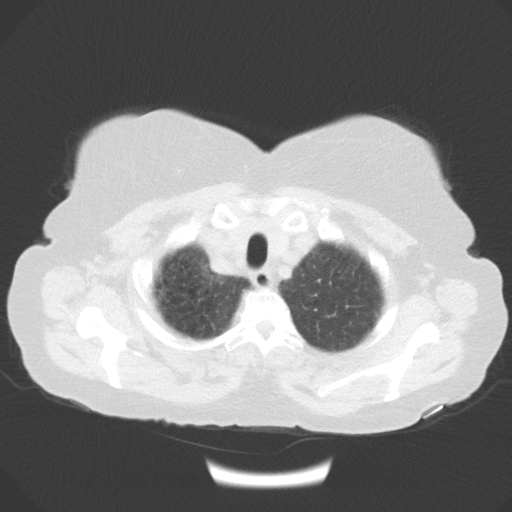
[im 50/55  lung]
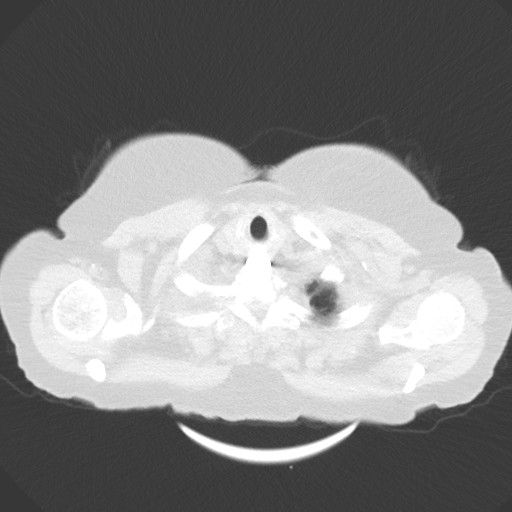

[Series 5: coronal · coronal · 0.59mm/px · 3 of 282 slices shown]
[im 57/282  lung]
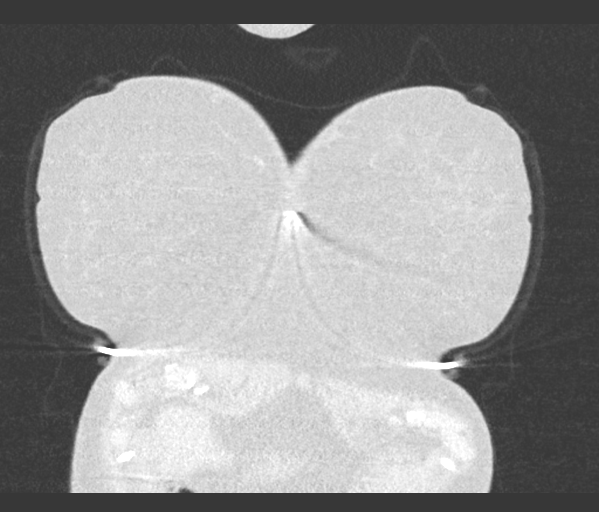
[im 113/282  lung]
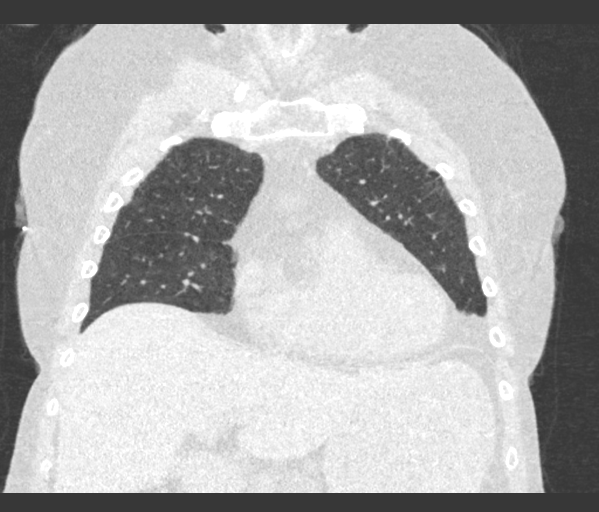
[im 169/282  lung]
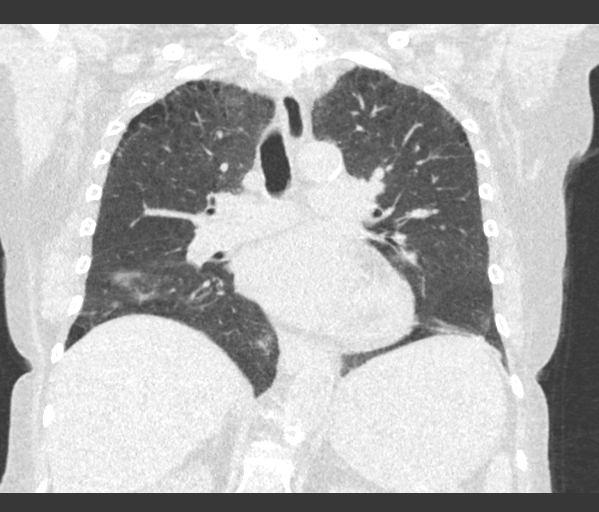

[15 of 40 positions shown; findings below may reference images not displayed]

FINDINGS: Cardiovascular: Aortic atherosclerosis. Tortuous thoracic aorta.
Mild cardiomegaly, accentuated by a pectus excavatum deformity. Lad
and left circumflex coronary artery calcification.

Mediastinum/Nodes: No mediastinal or definite hilar adenopathy,
given limitations of unenhanced CT. Fluid level in the esophagus on
[DATE].

Lungs/Pleura: No pleural fluid. Moderate centrilobular and
paraseptal emphysema with right greater than left base scarring. No
suspicious pulmonary nodule or mass.

Upper Abdomen: Normal imaged portions of the liver, spleen, stomach,
adrenal glands, kidneys.

Musculoskeletal: Lower cervical spine fixation. Upper thoracic
spondylosis.
IMPRESSION: 1. Lung-RADS 1, negative. Continue annual screening with low-dose
chest CT without contrast in 12 months.
2. Esophageal air fluid level suggests dysmotility or
gastroesophageal reflux.
3. Aortic Atherosclerosis ([TY]-[TY]) and Emphysema ([TY]-[TY]).
4. Age advanced coronary artery atherosclerosis. Recommend
assessment of coronary risk factors and consideration of medical
therapy.

## 2020-01-23 ENCOUNTER — Other Ambulatory Visit: Payer: Self-pay | Admitting: Physician Assistant

## 2020-01-25 ENCOUNTER — Other Ambulatory Visit: Payer: Self-pay | Admitting: Physician Assistant

## 2020-01-26 ENCOUNTER — Other Ambulatory Visit: Payer: Self-pay | Admitting: Physician Assistant

## 2020-02-01 ENCOUNTER — Other Ambulatory Visit: Payer: Self-pay | Admitting: Physician Assistant

## 2020-03-09 ENCOUNTER — Ambulatory Visit: Payer: Federal, State, Local not specified - PPO | Admitting: Physician Assistant

## 2020-04-20 ENCOUNTER — Other Ambulatory Visit: Payer: Self-pay | Admitting: Physician Assistant

## 2020-05-11 ENCOUNTER — Other Ambulatory Visit: Payer: Self-pay | Admitting: Pulmonary Disease

## 2020-06-11 ENCOUNTER — Other Ambulatory Visit: Payer: Self-pay | Admitting: Physician Assistant

## 2020-06-28 ENCOUNTER — Other Ambulatory Visit: Payer: Self-pay

## 2020-06-28 ENCOUNTER — Emergency Department (HOSPITAL_COMMUNITY): Payer: Medicare Other

## 2020-06-28 ENCOUNTER — Ambulatory Visit
Admission: EM | Admit: 2020-06-28 | Discharge: 2020-06-28 | Disposition: A | Discharge status: Home / Self Care | Payer: Medicare Other

## 2020-06-28 ENCOUNTER — Encounter (HOSPITAL_COMMUNITY): Payer: Self-pay | Admitting: Emergency Medicine

## 2020-06-28 ENCOUNTER — Inpatient Hospital Stay (HOSPITAL_COMMUNITY)
Admission: EM | Admit: 2020-06-28 | Discharge: 2020-07-01 | DRG: 177 | Disposition: A | Discharge status: Home / Self Care | Payer: Medicare Other | Attending: Family Medicine | Admitting: Family Medicine

## 2020-06-28 DIAGNOSIS — Z90710 Acquired absence of both cervix and uterus: Secondary | ICD-10-CM

## 2020-06-28 DIAGNOSIS — R0602 Shortness of breath: Secondary | ICD-10-CM | POA: Diagnosis not present

## 2020-06-28 DIAGNOSIS — K219 Gastro-esophageal reflux disease without esophagitis: Secondary | ICD-10-CM | POA: Diagnosis present

## 2020-06-28 DIAGNOSIS — G894 Chronic pain syndrome: Secondary | ICD-10-CM | POA: Diagnosis present

## 2020-06-28 DIAGNOSIS — Z7951 Long term (current) use of inhaled steroids: Secondary | ICD-10-CM | POA: Diagnosis not present

## 2020-06-28 DIAGNOSIS — J9601 Acute respiratory failure with hypoxia: Secondary | ICD-10-CM | POA: Diagnosis not present

## 2020-06-28 DIAGNOSIS — J449 Chronic obstructive pulmonary disease, unspecified: Secondary | ICD-10-CM | POA: Diagnosis not present

## 2020-06-28 DIAGNOSIS — F17210 Nicotine dependence, cigarettes, uncomplicated: Secondary | ICD-10-CM | POA: Diagnosis present

## 2020-06-28 DIAGNOSIS — E876 Hypokalemia: Secondary | ICD-10-CM | POA: Diagnosis not present

## 2020-06-28 DIAGNOSIS — F32A Depression, unspecified: Secondary | ICD-10-CM | POA: Diagnosis present

## 2020-06-28 DIAGNOSIS — Z720 Tobacco use: Secondary | ICD-10-CM | POA: Diagnosis present

## 2020-06-28 DIAGNOSIS — Z833 Family history of diabetes mellitus: Secondary | ICD-10-CM | POA: Diagnosis not present

## 2020-06-28 DIAGNOSIS — M549 Dorsalgia, unspecified: Secondary | ICD-10-CM | POA: Diagnosis present

## 2020-06-28 DIAGNOSIS — R739 Hyperglycemia, unspecified: Secondary | ICD-10-CM | POA: Diagnosis present

## 2020-06-28 DIAGNOSIS — Z825 Family history of asthma and other chronic lower respiratory diseases: Secondary | ICD-10-CM

## 2020-06-28 DIAGNOSIS — U071 COVID-19: Secondary | ICD-10-CM | POA: Diagnosis present

## 2020-06-28 DIAGNOSIS — Z79899 Other long term (current) drug therapy: Secondary | ICD-10-CM

## 2020-06-28 DIAGNOSIS — Z86010 Personal history of colonic polyps: Secondary | ICD-10-CM

## 2020-06-28 DIAGNOSIS — M1990 Unspecified osteoarthritis, unspecified site: Secondary | ICD-10-CM | POA: Diagnosis present

## 2020-06-28 DIAGNOSIS — Z981 Arthrodesis status: Secondary | ICD-10-CM | POA: Diagnosis not present

## 2020-06-28 DIAGNOSIS — J439 Emphysema, unspecified: Secondary | ICD-10-CM | POA: Diagnosis present

## 2020-06-28 DIAGNOSIS — J9621 Acute and chronic respiratory failure with hypoxia: Secondary | ICD-10-CM

## 2020-06-28 DIAGNOSIS — Z818 Family history of other mental and behavioral disorders: Secondary | ICD-10-CM

## 2020-06-28 DIAGNOSIS — F419 Anxiety disorder, unspecified: Secondary | ICD-10-CM | POA: Diagnosis present

## 2020-06-28 DIAGNOSIS — E872 Acidosis, unspecified: Secondary | ICD-10-CM

## 2020-06-28 DIAGNOSIS — Z791 Long term (current) use of non-steroidal anti-inflammatories (NSAID): Secondary | ICD-10-CM

## 2020-06-28 DIAGNOSIS — R0902 Hypoxemia: Secondary | ICD-10-CM

## 2020-06-28 LAB — LACTATE DEHYDROGENASE: LDH: 254 U/L — ABNORMAL HIGH (ref 98–192)

## 2020-06-28 LAB — BASIC METABOLIC PANEL
Anion gap: 8 (ref 5–15)
BUN: 11 mg/dL (ref 8–23)
CO2: 28 mmol/L (ref 22–32)
Calcium: 8.2 mg/dL — ABNORMAL LOW (ref 8.9–10.3)
Chloride: 102 mmol/L (ref 98–111)
Creatinine, Ser: 0.65 mg/dL (ref 0.44–1.00)
GFR, Estimated: >60 mL/min (ref >60)
Glucose, Bld: 100 mg/dL — ABNORMAL HIGH (ref 70–99)
Potassium: 3.3 mmol/L — ABNORMAL LOW (ref 3.5–5.1)
Sodium: 138 mmol/L (ref 135–145)

## 2020-06-28 LAB — FERRITIN: Ferritin: 48 ng/mL (ref 11–307)

## 2020-06-28 LAB — HEPATIC FUNCTION PANEL
ALT: 23 U/L (ref 0–44)
AST: 41 U/L (ref 15–41)
Albumin: 2.8 g/dL — ABNORMAL LOW (ref 3.5–5.0)
Alkaline Phosphatase: 95 U/L (ref 38–126)
Bilirubin, Direct: 0.1 mg/dL (ref 0.0–0.2)
Indirect Bilirubin: 0.4 mg/dL (ref 0.3–0.9)
Total Bilirubin: 0.5 mg/dL (ref 0.3–1.2)
Total Protein: 5.9 g/dL — ABNORMAL LOW (ref 6.5–8.1)

## 2020-06-28 LAB — TROPONIN I (HIGH SENSITIVITY)
Troponin I (High Sensitivity): 5 ng/L (ref <18)
Troponin I (High Sensitivity): 5 ng/L

## 2020-06-28 LAB — CBC
HCT: 38.9 % (ref 36.0–46.0)
Hemoglobin: 12 g/dL (ref 12.0–15.0)
MCH: 30.3 pg (ref 26.0–34.0)
MCHC: 30.8 g/dL (ref 30.0–36.0)
MCV: 98.2 fL (ref 80.0–100.0)
Platelets: 304 10*3/uL (ref 150–400)
RBC: 3.96 MIL/uL (ref 3.87–5.11)
RDW: 13.9 % (ref 11.5–15.5)
WBC: 9.6 10*3/uL (ref 4.0–10.5)
nRBC: 0 % (ref 0.0–0.2)

## 2020-06-28 LAB — LACTIC ACID, PLASMA
Lactic Acid, Venous: 1.4 mmol/L (ref 0.5–1.9)
Lactic Acid, Venous: 2.2 mmol/L (ref 0.5–1.9)

## 2020-06-28 LAB — C-REACTIVE PROTEIN: CRP: 2.6 mg/dL — ABNORMAL HIGH

## 2020-06-28 LAB — D-DIMER, QUANTITATIVE: D-Dimer, Quant: 0.83 ug{FEU}/mL — ABNORMAL HIGH (ref 0.00–0.50)

## 2020-06-28 LAB — CBG MONITORING, ED: Glucose-Capillary: 273 mg/dL — ABNORMAL HIGH (ref 70–99)

## 2020-06-28 LAB — POC SARS CORONAVIRUS 2 AG -  ED: SARS Coronavirus 2 Ag: POSITIVE — AB

## 2020-06-28 LAB — FIBRINOGEN: Fibrinogen: 289 mg/dL (ref 210–475)

## 2020-06-28 LAB — TRIGLYCERIDES: Triglycerides: 68 mg/dL

## 2020-06-28 LAB — PROCALCITONIN: Procalcitonin: 0.26 ng/mL

## 2020-06-28 IMAGING — DX DG CHEST 1V PORT
2 series · 2 of 2 positions shown · non-contrast
Comparison: None.

CLINICAL DATA: Short of breath

EXAM:
PORTABLE CHEST 1 VIEW

[chest ap (1 of 2)]
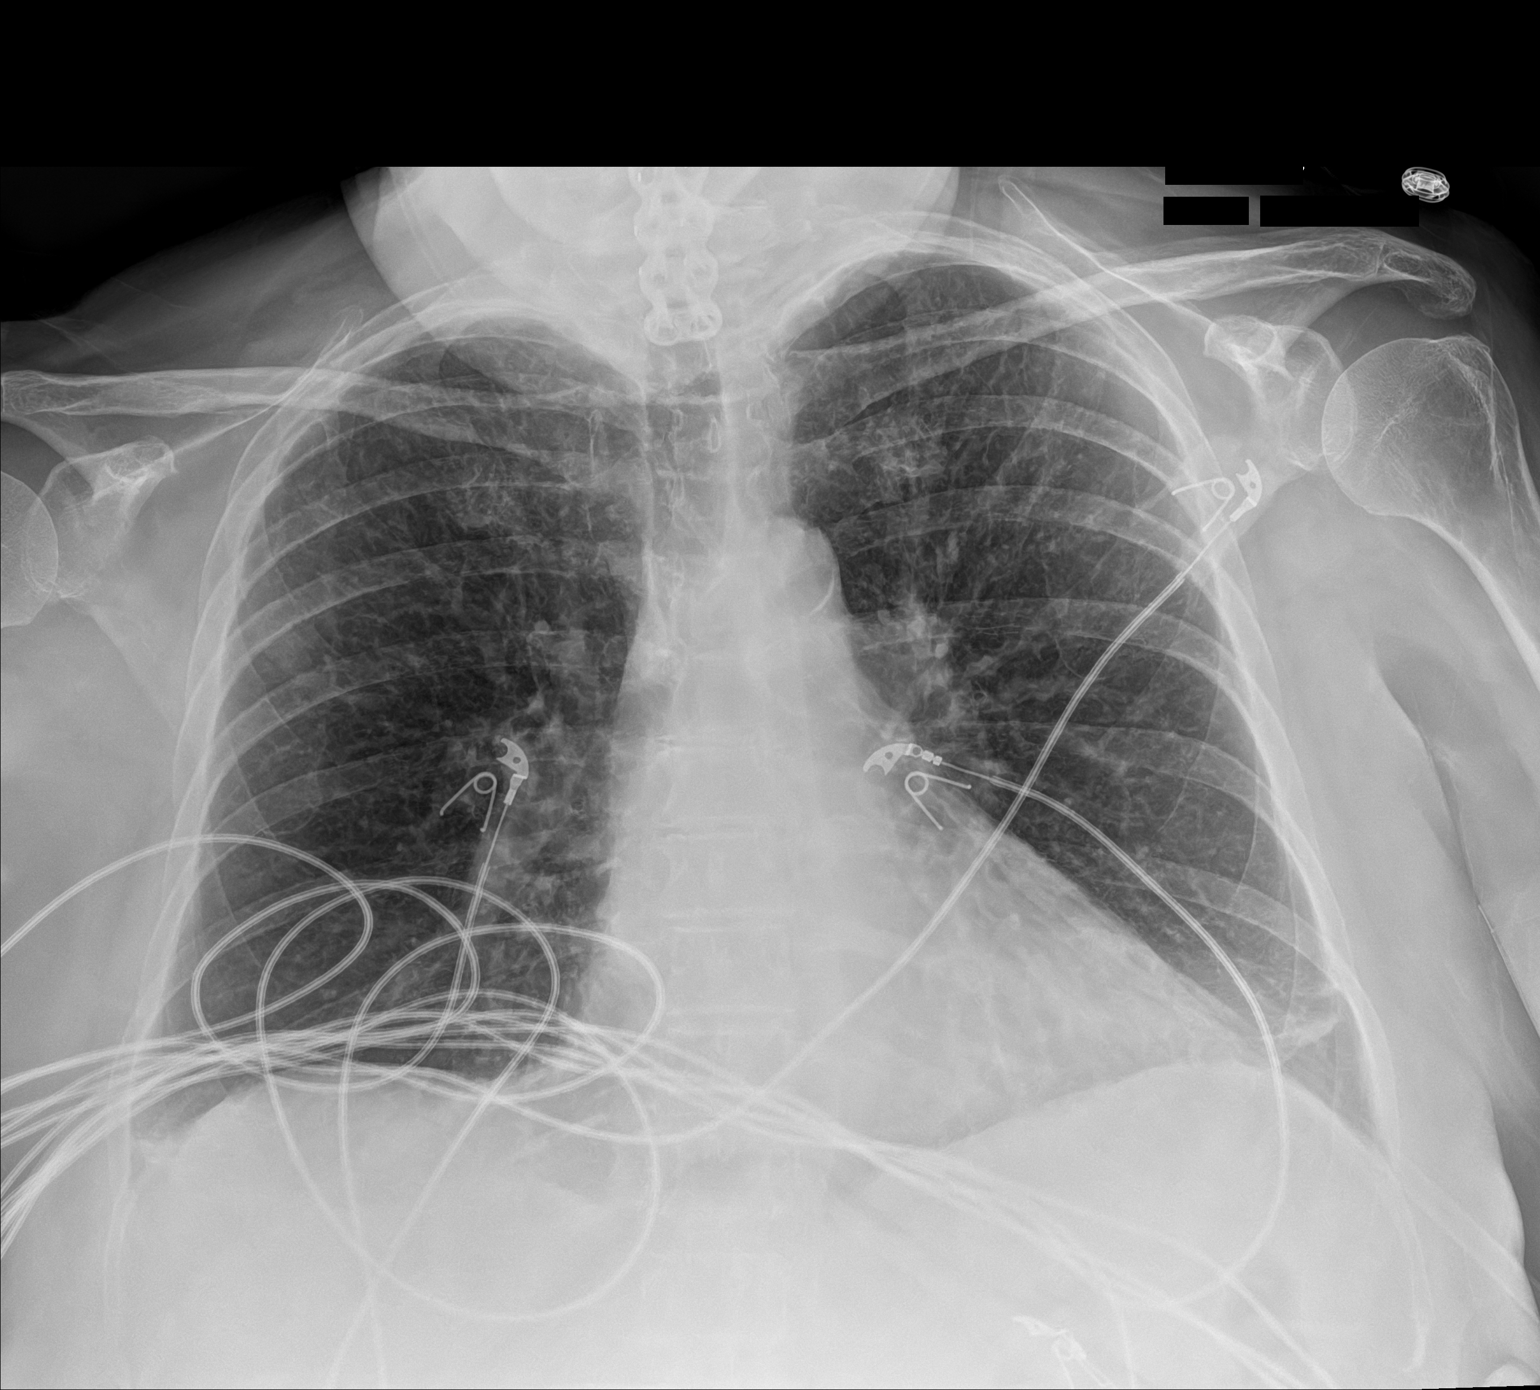

[chest ap (2 of 2)]
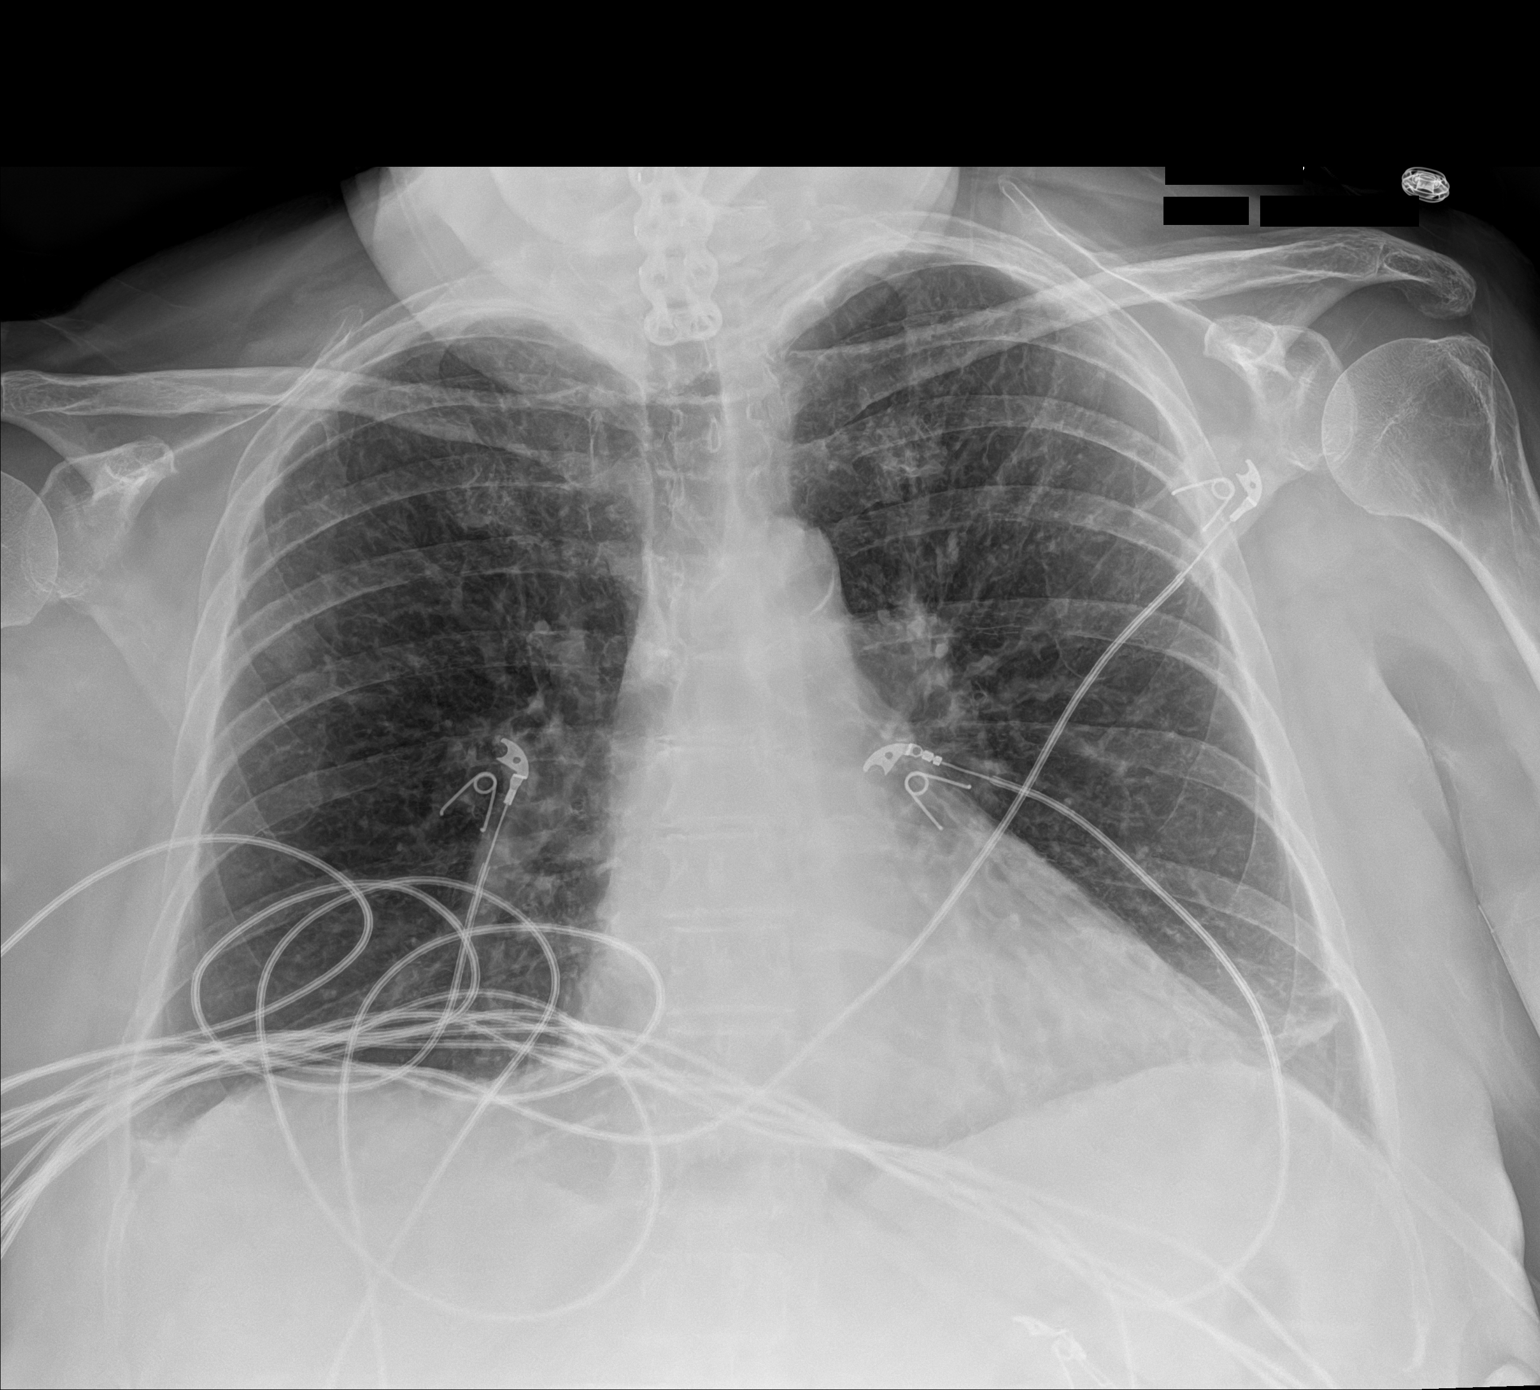

[2 of 2 positions shown; findings below may reference images not displayed]

FINDINGS: Single frontal view of the chest demonstrates an unremarkable
cardiac silhouette. Linear density left lung base likely
atelectasis. No airspace disease, effusion, or pneumothorax. No
acute bony abnormalities.
IMPRESSION: 1. No acute intrathoracic process.

## 2020-06-28 MED ORDER — SODIUM CHLORIDE 0.9 % IV SOLN
100.0000 mg | Freq: Once | INTRAVENOUS | Status: AC
  Administered 2020-06-28: 100 mg via INTRAVENOUS
  Filled 2020-06-28: qty 20

## 2020-06-28 MED ORDER — METHYLPREDNISOLONE SODIUM SUCC 125 MG IJ SOLR
125.0000 mg | Freq: Once | INTRAMUSCULAR | Status: AC
  Administered 2020-06-28: 125 mg via INTRAVENOUS
  Filled 2020-06-28: qty 2

## 2020-06-28 MED ORDER — ENOXAPARIN SODIUM 40 MG/0.4ML ~~LOC~~ SOLN
40.0000 mg | SUBCUTANEOUS | Status: DC
  Administered 2020-06-28 – 2020-06-30 (×3): 40 mg via SUBCUTANEOUS
  Filled 2020-06-28 (×3): qty 0.4

## 2020-06-28 MED ORDER — ASCORBIC ACID 500 MG PO TABS
500.0000 mg | ORAL_TABLET | Freq: Every day | ORAL | Status: DC
  Administered 2020-06-28 – 2020-07-01 (×4): 500 mg via ORAL
  Filled 2020-06-28 (×4): qty 1

## 2020-06-28 MED ORDER — GUAIFENESIN-DM 100-10 MG/5ML PO SYRP
10.0000 mL | ORAL_SOLUTION | ORAL | Status: DC | PRN
  Administered 2020-06-28 – 2020-06-30 (×4): 10 mL via ORAL
  Filled 2020-06-28 (×6): qty 10

## 2020-06-28 MED ORDER — ALBUTEROL SULFATE HFA 108 (90 BASE) MCG/ACT IN AERS
2.0000 | INHALATION_SPRAY | Freq: Four times a day (QID) | RESPIRATORY_TRACT | Status: DC
  Administered 2020-06-28 – 2020-06-29 (×2): 2 via RESPIRATORY_TRACT
  Filled 2020-06-28: qty 6.7

## 2020-06-28 MED ORDER — METHYLPREDNISOLONE SODIUM SUCC 40 MG IJ SOLR
0.5000 mg/kg | Freq: Two times a day (BID) | INTRAMUSCULAR | Status: DC
  Administered 2020-06-28 – 2020-06-29 (×2): 29.6 mg via INTRAVENOUS
  Filled 2020-06-28 (×2): qty 1

## 2020-06-28 MED ORDER — INSULIN ASPART 100 UNIT/ML ~~LOC~~ SOLN
0.0000 [IU] | Freq: Three times a day (TID) | SUBCUTANEOUS | Status: DC
  Administered 2020-06-29: 1 [IU] via SUBCUTANEOUS
  Administered 2020-06-29: 2 [IU] via SUBCUTANEOUS
  Administered 2020-06-30 – 2020-07-01 (×4): 1 [IU] via SUBCUTANEOUS

## 2020-06-28 MED ORDER — SODIUM CHLORIDE 0.9 % IV SOLN
100.0000 mg | Freq: Every day | INTRAVENOUS | Status: DC
  Administered 2020-06-29 – 2020-07-01 (×3): 100 mg via INTRAVENOUS
  Filled 2020-06-28 (×3): qty 20

## 2020-06-28 MED ORDER — INSULIN ASPART 100 UNIT/ML ~~LOC~~ SOLN
0.0000 [IU] | Freq: Every day | SUBCUTANEOUS | Status: DC
  Administered 2020-06-28: 3 [IU] via SUBCUTANEOUS
  Filled 2020-06-28: qty 1

## 2020-06-28 MED ORDER — ZINC SULFATE 220 (50 ZN) MG PO CAPS
220.0000 mg | ORAL_CAPSULE | Freq: Every day | ORAL | Status: DC
  Administered 2020-06-28 – 2020-07-01 (×4): 220 mg via ORAL
  Filled 2020-06-28 (×4): qty 1

## 2020-06-28 MED ORDER — POTASSIUM CHLORIDE CRYS ER 20 MEQ PO TBCR
40.0000 meq | EXTENDED_RELEASE_TABLET | Freq: Once | ORAL | Status: AC
  Administered 2020-06-29: 40 meq via ORAL
  Filled 2020-06-28: qty 2

## 2020-06-28 MED ORDER — HYDROCOD POLST-CPM POLST ER 10-8 MG/5ML PO SUER
5.0000 mL | Freq: Two times a day (BID) | ORAL | Status: DC | PRN
Start: 2020-06-28 — End: 2020-07-01
  Administered 2020-06-28 – 2020-06-29 (×2): 5 mL via ORAL
  Filled 2020-06-28 (×2): qty 5

## 2020-06-28 MED ORDER — PREDNISONE 20 MG PO TABS: 50.0000 mg | ORAL_TABLET | Freq: Every day | ORAL | Status: DC

## 2020-06-28 MED ORDER — PANTOPRAZOLE SODIUM 40 MG IV SOLR
40.0000 mg | Freq: Every day | INTRAVENOUS | Status: DC
  Administered 2020-06-28: 40 mg via INTRAVENOUS
  Filled 2020-06-28: qty 40

## 2020-06-28 MED ORDER — ACETAMINOPHEN 325 MG PO TABS
650.0000 mg | ORAL_TABLET | Freq: Four times a day (QID) | ORAL | Status: DC | PRN
  Administered 2020-06-29 – 2020-06-30 (×2): 650 mg via ORAL
  Filled 2020-06-28 (×2): qty 2

## 2020-06-28 MED ORDER — DM-GUAIFENESIN ER 30-600 MG PO TB12
1.0000 | ORAL_TABLET | Freq: Two times a day (BID) | ORAL | Status: DC
  Administered 2020-06-29 – 2020-07-01 (×5): 1 via ORAL
  Filled 2020-06-28 (×5): qty 1

## 2020-06-28 NOTE — ED Notes (Signed)
Pt eating dinner tray and o2 dropped to 83% on 2 lpm. I bumped pt up to 4lpm via nasal cannula and she is now 95%. Bedside toilet placed in room for pt to use when needed.

## 2020-06-28 NOTE — H&P (Signed)
History and Physical  Holly Phillips KYH:[id number] DOB: Oct 25, 1955 DOA: 06/28/2020  Referring physician: Janet Berlin PCP: Inda Coke, Wann  Patient coming from: Home  Chief Complaint: Shortness of breath  HPI: Holly Phillips is a 65 y.o. female with medical history significant for COPD (not on home oxygen) arthritis, tobacco/vape use, GERD who presents to the emergency department due to worsening shortness of breath.  She complained of 2-week onset of body aches, fatigue, decreased appetite with loss of taste, shortness of breath started about 3 days ago and has been worsening, and this is associated with cough.  He also complained of bilateral ankle and feet swelling which started about 3 days ago.  She went to a local urgent care center where she was noted to be hypoxic with an O2 sat of 75% on room air.  EMS was activated and patient was taken to the ED for further evaluation and management.  Nebulizer treatment with DuoNeb x2 was provided in route to the hospital.  Patient never had COVID vaccine  ED Course: In the emergency department, was initially tachypneic, O2 sat ranged within 94-97% on supplemental oxygen at 4 PM.  Work-up in the ED showed normal CBC, hypokalemia.  SARS coronavirus 2 was positive.  Procalcitonin 0.26, CRP 2.6, D-dimer 0.83 Chest x-ray personally reviewed showed no acute pulmonary disease She was treated with IV Solu-Medrol and remdesivir.  Hospitalist was asked to admit patient for further evaluation and management.  Review of Systems: Constitutional: Positive for fatigue.  Negative for chills and fever.  HENT: Negative for ear pain and sore throat.   Eyes: Negative for pain and visual disturbance.  Respiratory: Positive for cough and shortness of breath.   Cardiovascular: Negative for chest pain and palpitations.  Gastrointestinal: Negative for abdominal pain and vomiting.  Endocrine: Negative for polyphagia and polyuria.  Genitourinary:  Negative for decreased urine volume, dysuria Musculoskeletal: Positive for body aches, leg swelling. Skin: Negative for color change and rash.  Allergic/Immunologic: Negative for immunocompromised state.  Neurological: Negative for tremors, syncope, speech difficulty, weakness, light-headedness and headaches.  Hematological: Does not bruise/bleed easily.  All other systems reviewed and are negative    Past Medical History:  Diagnosis Date  . Anxiety   . Arthritis   . Asthma   . Chronic back pain   . Colon polyps   . COPD (chronic obstructive pulmonary disease) ([place])   . Depression   . Emphysema of lung [facility])    Past Surgical History:  Procedure Laterality Date  . ABDOMINAL HYSTERECTOMY    . CERVICAL SPINE SURGERY    . INNER EAR SURGERY    . LUMBAR FUSION  2012   L3-L7    Social History:  reports that she has been smoking cigarettes. She has been smoking about 1.00 pack per day. She has never used smokeless tobacco. She reports previous alcohol use. She reports previous drug use. Drug: Other-see comments.   No Known Allergies  Family History  Problem Relation Age of Onset  . Depression Mother   . Diabetes Mother   . Hypertension Mother   . Hyperlipidemia Mother   . Heart attack Mother   . Osteoarthritis Father   . Asthma Father   . COPD Father   . Breast cancer Sister   . Heart attack Maternal Grandmother   . Lupus Sister   . Osteoarthritis Sister   . Asthma Sister   . COPD Sister   . Diabetes Sister   . Drug abuse Sister   .  Heart attack Sister   . Colon cancer Neg Hx   . Esophageal cancer Neg Hx      Prior to Admission medications   Medication Sig Start Date End Date Taking? Authorizing Provider  albuterol (VENTOLIN HFA) 108 (90 Base) MCG/ACT inhaler INHALE 1 TO 2 PUFFS INTO THE LUNGS EVERY 6 HOURS AS NEEDED FOR WHEEZING OR SHORTNESS OF BREATH 06/11/20   Inda Coke, PA  atorvastatin (LIPITOR) 10 MG tablet TAKE 1 TABLET(10 MG) BY MOUTH DAILY 01/24/20    Inda Coke, PA  budesonide-formoterol [facility]) 160-4.5 MCG/ACT inhaler INHALE 2 PUFFS INTO THE LUNGS TWICE DAILY 05/14/20   Chesley Mires, MD  celecoxib (CELEBREX) 100 MG capsule TAKE 1 CAPSULE(100 MG) BY MOUTH DAILY 04/20/20   Inda Coke, PA  cyclobenzaprine (FLEXERIL) 10 MG tablet Take 1 tablet (10 mg total) by mouth 3 (three) times daily. 08/01/19   Inda Coke, PA  escitalopram (LEXAPRO) 20 MG tablet Take 20 mg by mouth daily.    [provider]  gabapentin (NEURONTIN) 800 MG tablet TAKE 1 TABLET(800 MG) BY MOUTH THREE TIMES DAILY 01/25/20   Inda Coke, PA  LINZESS 290 MCG CAPS capsule TAKE 1 CAPSULE(290 MCG) BY MOUTH DAILY BEFORE BREAKFAST 02/01/20   Inda Coke, PA  montelukast (SINGULAIR) 10 MG tablet TAKE 1 TABLET(10 MG) BY MOUTH DAILY 01/24/20   Inda Coke, PA  nicotine (NICODERM CQ - DOSED IN MG/24 HOURS) 21 mg/24hr patch Place 1 patch (21 mg total) onto the skin daily. 12/16/19   Inda Coke, PA  omeprazole (PRILOSEC) 40 MG capsule Take 1 capsule (40 mg total) by mouth 2 (two) times daily. 08/01/19   Inda Coke, PA  oxyCODONE-acetaminophen (PERCOCET) 10-325 MG tablet Take 1 tablet by mouth 4 (four) times daily as needed. 06/08/19   [provider]  traZODone (DESYREL) 150 MG tablet Take 150 mg by mouth at bedtime. 08/26/19   [provider]    Physical Exam: BP 92/81   Pulse 84   Temp 98.5 F (36.9 C) (Oral)   Resp 16   Ht 5' (1.524 m)   Wt 59.4 kg   LMP  (LMP Unknown)   SpO2 94%   BMI 25.58 kg/m   . General: 65 y.o. year-old female well developed well nourished in no acute distress.  Alert and oriented x3. Marland Kitchen HEENT: NCAT, EOMI . Neck: Supple, trachea medial . Cardiovascular: Regular rate and rhythm with no rubs or gallops.  No thyromegaly or JVD noted.  No lower extremity edema. 2/4 pulses in all 4 extremities. Marland Kitchen Respiratory: Mild diffuse expiratory wheezing.  Decreased breath sounds in LLL. Marland Kitchen Abdomen: Soft  nontender nondistended with normal bowel sounds x4 quadrants. . Muskuloskeletal: Trace bilateral edema in the ankles.  No cyanosis or clubbing  . Neuro: CN II-XII intact, strength, sensation, reflexes intact . Skin: No ulcerative lesions noted or rashes . Psychiatry: Judgement and insight appear normal. Mood is appropriate for condition and setting          Labs on Admission:  Basic Metabolic Panel: Recent Labs  Lab 06/28/20 1711  NA 138  K 3.3*  CL 102  CO2 28  GLUCOSE 100*  BUN 11  CREATININE 0.65  CALCIUM 8.2*   Liver Function Tests: Recent Labs  Lab 06/28/20 2022  AST 41  ALT 23  ALKPHOS 95  BILITOT 0.5  PROT 5.9*  ALBUMIN 2.8*   No results for input(s): LIPASE, AMYLASE in the last 168 hours. No results for input(s): AMMONIA in the last 168  hours. CBC: Recent Labs  Lab 06/28/20 1711  WBC 9.6  HGB 12.0  HCT 38.9  MCV 98.2  PLT 304   Cardiac Enzymes: No results for input(s): CKTOTAL, CKMB, CKMBINDEX, TROPONINI in the last 168 hours.  BNP (last 3 results) No results for input(s): BNP in the last 8760 hours.  ProBNP (last 3 results) No results for input(s): PROBNP in the last 8760 hours.  CBG: Recent Labs  Lab 06/28/20 2123  GLUCAP 273*    Radiological Exams on Admission: DG Chest Portable 1 View  Result Date: 06/28/2020 CLINICAL DATA:  Short of breath EXAM: PORTABLE CHEST 1 VIEW COMPARISON:  None. FINDINGS: Single frontal view of the chest demonstrates an unremarkable cardiac silhouette. Linear density left lung base likely atelectasis. No airspace disease, effusion, or pneumothorax. No acute bony abnormalities. IMPRESSION: 1. No acute intrathoracic process. Electronically Signed   By: Randa Ngo M.D.   On: 06/28/2020 18:45    EKG: I independently viewed the EKG done and my findings are as followed: Normal sinus rhythm at a rate of 87 bpm  Assessment/Plan Present on Admission: . COVID-19 virus infection . Tobacco abuse . Gastroesophageal  reflux disease  Active Problems:   Tobacco abuse   COPD (chronic obstructive pulmonary disease) ([facility])   Gastroesophageal reflux disease   COVID-19 virus infection   Acute respiratory failure with hypoxia ([facility])   Hypokalemia  Acute respiratory failure with hypoxia secondary to COVID-19 virus infection superimposed with mild acute exacerbation of COPD SARS coronavirus 2 was positive Continue albuterol q.6h Continue IV Solu-Medrol per pharmacy dosing Continue IV Remdesivir per pharmacy protocol Continue vitamin-C 500 mg p.o. Daily Continue zinc 220 mg p.o. Daily Continue Mucinex, Robitussin and Tussionex Continue Tylenol p.r.n. for fever Continue supplemental oxygen to maintain O2 sat > or = 94% with plan to wean patient off supplemental oxygen as tolerated (of note, patient does not use oxygen at baseline) Continue incentive spirometry and flutter valve q42min as tolerated Encourage proning, early ambulation, and side laying as tolerated Continue airborne isolation precaution Inflammatory markers: LDH: 254 CRP: 2.6 D-dimer: 0.83 Ferritin: 48 Continue monitoring daily inflammatory markers Physician PPE:  Surgical mask with face shield, N-95, nonsterile gloves, disposable gown, head and shoe covers Patient PPE:  Face mask   Lactic acidosis possibly secondary to hypoxia Lactic acid 2.2, continue supplemental oxygen as described above Continue to trend lactic acid  Hypokalemia K+ 3.3; this will be replenished  GERD Continue Protonix  DVT prophylaxis: Lovenox  Code Status: Full code  Family Communication: None at bedside  Disposition Plan:  Patient is from:                        home Anticipated DC to:                   SNF or family members home Anticipated DC date:               2-3 days Anticipated DC barriers:           Patient is unstable to be discharged at this time due to hypoxic respiratory failure secondary to COVID-19 virus infection requiring inpatient  management   Consults called: None  Admission status: Inpatient  Bernadette Hoit MD [facility]  06/28/2020, 10:34 PM

## 2020-06-28 NOTE — ED Triage Notes (Signed)
Pt arrives from urgent care via [facility]. Pt was 89% on RA. Pt was given 2 duonebs en route. Pt denies pain. Pt reports bilateral leg swelling.

## 2020-06-28 NOTE — ED Triage Notes (Signed)
Pt placed on 2L nasal cannula.

## 2020-06-28 NOTE — ED Provider Notes (Signed)
[facility] EMERGENCY DEPARTMENT Provider Note   CSN: [id number] Arrival date & time: 06/28/20  1701     History Chief Complaint  Patient presents with  . Shortness of Breath    Holly Phillips is a 65 y.o. female with a history of COPD, chronic back pain, arthritis and persistent tobacco use presenting with complaints of shortness of breath.  She reports an approximate 3-day history of increasing shortness of breath in association with nonproductive cough, chest tightness and wheezing.  Prior to this she describes generalized fatigue and myalgias along with reduced taste sensation for about 2 weeks.  She has had no nausea or vomiting, denies upper respiratory symptoms, sore throat, chest pain but does endorse tightness across to her mid chest which is worsened with coughing.  She is also noted swelling in her bilateral ankles and feet.  She was seen at our local urgent care center at which time her oxygen saturation was 75% on room air and she was promptly transported here by EMS.  She received 2 DuoNeb breathing treatments prior to arrival.  She reports her breathing is currently improved.  She is satting 90% on 2 L nasal cannula.  She is not Covid vaccinated.  She is not aware of any Covid exposures.  HPI     Past Medical History:  Diagnosis Date  . Anxiety   . Arthritis   . Asthma   . Chronic back pain   . Colon polyps   . COPD (chronic obstructive pulmonary disease) ([place])   . Depression   . Emphysema of lung [facility])     Patient Active Problem List   Diagnosis Date Noted  . Mixed conductive and sensorineural hearing loss of both ears 08/25/2019  . Dysphagia 08/25/2019  . Tobacco abuse 08/01/2019  . Emphysema of lung ([place]) 08/01/2019  . Arthritis 08/01/2019  . History of lumbar fusion 08/01/2019  . Recurrent depression ([place]) 08/01/2019  . Anxiety 08/01/2019  . Hyperlipidemia 08/01/2019  . Constipation 08/01/2019  . Gastroesophageal reflux disease 08/01/2019  . Family  history of heart disease 08/01/2019    Past Surgical History:  Procedure Laterality Date  . ABDOMINAL HYSTERECTOMY    . CERVICAL SPINE SURGERY    . INNER EAR SURGERY    . LUMBAR FUSION  2012   L3-L7     OB History   No obstetric history on file.     Family History  Problem Relation Age of Onset  . Depression Mother   . Diabetes Mother   . Hypertension Mother   . Hyperlipidemia Mother   . Heart attack Mother   . Osteoarthritis Father   . Asthma Father   . COPD Father   . Breast cancer Sister   . Heart attack Maternal Grandmother   . Lupus Sister   . Osteoarthritis Sister   . Asthma Sister   . COPD Sister   . Diabetes Sister   . Drug abuse Sister   . Heart attack Sister   . Colon cancer Neg Hx   . Esophageal cancer Neg Hx     Social History   Tobacco Use  . Smoking status: Current Every Day Smoker    Packs/day: 1.00    Types: Cigarettes  . Smokeless tobacco: Never Used  Vaping Use  . Vaping Use: Every day  Substance Use Topics  . Alcohol use: Not Currently  . Drug use: Not Currently    Types: Other-see comments    Comment: CBD and medical marijuana  Home Medications Prior to Admission medications   Medication Sig Start Date End Date Taking? Authorizing Provider  albuterol (VENTOLIN HFA) 108 (90 Base) MCG/ACT inhaler INHALE 1 TO 2 PUFFS INTO THE LUNGS EVERY 6 HOURS AS NEEDED FOR WHEEZING OR SHORTNESS OF BREATH 06/11/20   Inda Coke, PA  atorvastatin (LIPITOR) 10 MG tablet TAKE 1 TABLET(10 MG) BY MOUTH DAILY 01/24/20   Inda Coke, PA  budesonide-formoterol [facility]) 160-4.5 MCG/ACT inhaler INHALE 2 PUFFS INTO THE LUNGS TWICE DAILY 05/14/20   Chesley Mires, MD  celecoxib (CELEBREX) 100 MG capsule TAKE 1 CAPSULE(100 MG) BY MOUTH DAILY 04/20/20   Inda Coke, PA  cyclobenzaprine (FLEXERIL) 10 MG tablet Take 1 tablet (10 mg total) by mouth 3 (three) times daily. 08/01/19   Inda Coke, PA  escitalopram (LEXAPRO) 20 MG tablet Take 20 mg by  mouth daily.    [provider]  gabapentin (NEURONTIN) 800 MG tablet TAKE 1 TABLET(800 MG) BY MOUTH THREE TIMES DAILY 01/25/20   Inda Coke, PA  LINZESS 290 MCG CAPS capsule TAKE 1 CAPSULE(290 MCG) BY MOUTH DAILY BEFORE BREAKFAST 02/01/20   Inda Coke, PA  montelukast (SINGULAIR) 10 MG tablet TAKE 1 TABLET(10 MG) BY MOUTH DAILY 01/24/20   Inda Coke, PA  nicotine (NICODERM CQ - DOSED IN MG/24 HOURS) 21 mg/24hr patch Place 1 patch (21 mg total) onto the skin daily. 12/16/19   Inda Coke, PA  omeprazole (PRILOSEC) 40 MG capsule Take 1 capsule (40 mg total) by mouth 2 (two) times daily. 08/01/19   Inda Coke, PA  oxyCODONE-acetaminophen (PERCOCET) 10-325 MG tablet Take 1 tablet by mouth 4 (four) times daily as needed. 06/08/19   [provider]  traZODone (DESYREL) 150 MG tablet Take 150 mg by mouth at bedtime. 08/26/19   [provider]    Allergies    Patient has no known allergies.  Review of Systems   Review of Systems  Constitutional: Negative for chills and fever.  HENT: Negative for congestion and sore throat.   Eyes: Negative.   Respiratory: Positive for cough, chest tightness and shortness of breath.   Cardiovascular: Positive for leg swelling. Negative for chest pain and palpitations.  Gastrointestinal: Negative for abdominal pain, nausea and vomiting.  Genitourinary: Negative.   Musculoskeletal: Negative for arthralgias, joint swelling and neck pain.  Skin: Negative.  Negative for rash and wound.  Neurological: Negative for dizziness, weakness, light-headedness, numbness and headaches.  Psychiatric/Behavioral: Negative.     Physical Exam Updated Vital Signs BP 123/69   Pulse 84   Temp 98.5 F (36.9 C) (Oral)   Resp 11   Ht 5' (1.524 m)   Wt 59.4 kg   LMP  (LMP Unknown)   SpO2 91%   BMI 25.58 kg/m   Physical Exam Vitals and nursing note reviewed.  Constitutional:      Appearance: She is well-developed and  well-nourished.  HENT:     Head: Normocephalic and atraumatic.  Eyes:     Conjunctiva/sclera: Conjunctivae normal.  Cardiovascular:     Rate and Rhythm: Normal rate and regular rhythm.     Pulses: Intact distal pulses.     Heart sounds: Normal heart sounds.  Pulmonary:     Effort: Pulmonary effort is normal.     Breath sounds: Examination of the left-lower field reveals rales. Decreased breath sounds and rales present. No wheezing or rhonchi.     Comments: Decreased breath sounds throughout. No current active wheezing.   Abdominal:     General: Bowel sounds  are normal.     Palpations: Abdomen is soft.     Tenderness: There is no abdominal tenderness.  Musculoskeletal:        General: Normal range of motion.     Cervical back: Normal range of motion.  Skin:    General: Skin is warm and dry.  Neurological:     Mental Status: She is alert.  Psychiatric:        Mood and Affect: Mood and affect normal.     ED Results / Procedures / Treatments   Labs (all labs ordered are listed, but only abnormal results are displayed) Labs Reviewed  BASIC METABOLIC PANEL - Abnormal; Notable for the following components:      Result Value   Potassium 3.3 (*)    Glucose, Bld 100 (*)    Calcium 8.2 (*)    All other components within normal limits  POC SARS CORONAVIRUS 2 AG -  ED - Abnormal; Notable for the following components:   SARS Coronavirus 2 Ag Positive (*)    All other components within normal limits  CULTURE, BLOOD (ROUTINE X 2)  CULTURE, BLOOD (ROUTINE X 2)  CBC  LACTIC ACID, PLASMA  LACTIC ACID, PLASMA  D-DIMER, QUANTITATIVE (NOT AT [facility])  PROCALCITONIN  LACTATE DEHYDROGENASE  FERRITIN  TRIGLYCERIDES  FIBRINOGEN  C-REACTIVE PROTEIN  HEPATIC FUNCTION PANEL  TROPONIN I (HIGH SENSITIVITY)  TROPONIN I (HIGH SENSITIVITY)    EKG EKG Interpretation  Date/Time:  Thursday June 28 2020 17:27:55 EST Ventricular Rate:  87 PR Interval:    QRS Duration: 93 QT  Interval:  404 QTC Calculation: 486 R Axis:   67 Text Interpretation: Sinus rhythm Low voltage, extremity and precordial leads Borderline prolonged QT interval No old tracing to compare Confirmed by Calvert Cantor [phone]) on 06/28/2020 5:31:09 PM   Radiology DG Chest Portable 1 View  Result Date: 06/28/2020 CLINICAL DATA:  Short of breath EXAM: PORTABLE CHEST 1 VIEW COMPARISON:  None. FINDINGS: Single frontal view of the chest demonstrates an unremarkable cardiac silhouette. Linear density left lung base likely atelectasis. No airspace disease, effusion, or pneumothorax. No acute bony abnormalities. IMPRESSION: 1. No acute intrathoracic process. Electronically Signed   By: Randa Ngo M.D.   On: 06/28/2020 18:45    Procedures Procedures   Medications Ordered in ED Medications  methylPREDNISolone sodium succinate (SOLU-MEDROL) 125 mg/2 mL injection 125 mg (125 mg Intravenous Given 06/28/20 1743)    ED Course  I have reviewed the triage vital signs and the nursing notes.  Pertinent labs & imaging results that were available during my care of the patient were reviewed by me and considered in my medical decision making (see chart for details).    MDM Rules/Calculators/A&P                          Pt with profound hypoxia to 75% prior to arrival documented by our [facility].  Neb tx prior to arrival with improved breathing , pulse ox 91% on 2L.  Pt will require admission due to covid 19 and oxygen requirement.    Labs and imaging, ekg reviewed - initial troponin negative, cxr negative for pneumonia.  She was given IV solumedrol.  Call placed to hospitalist for admission.  Final Clinical Impression(s) / ED Diagnoses Final diagnoses:  COVID-19  Hypoxia    Rx / DC Orders ED Discharge Orders    None       Evalee Jefferson, Hershal Coria 06/28/20 1856  Truddie Hidden, MD 06/28/20 2033

## 2020-06-28 NOTE — ED Notes (Signed)
Date and time results received: 06/28/20 2336 (use smartphrase ".now" to insert current time)  Test: Lactic Acid Critical Value: 2.2  Name of Provider Notified: Dr. Josephine Cables  Orders Received? Or Actions Taken?: Awaiting response/ Any appropriate orders

## 2020-06-28 NOTE — ED Provider Notes (Signed)
Care assumed from PA [facility] at shift change pending admission call, please see her note for full details, but in brief Holly Phillips is a 65 y.o. female with a history of COPD, found to be Covid positive today after she was sent from urgent care for shortness of breath with hypoxia to 75% on room air.  Patient's COVID inflammatory markers are pending, chest x-ray without signs of pneumonia, received breathing treatments with improvement in work of breathing but still with 2 L oxygen requirement.  Case discussed with Dr. Josephine Cables with [facility] who will see and admit the patient.   Jacqlyn Larsen, PA-C 06/28/20 1936    Truddie Hidden, MD 06/28/20 2033

## 2020-06-28 NOTE — ED Triage Notes (Signed)
Patient is being discharged from the Urgent Care and sent to the Emergency Department via ems . Per Kirkland Hun, patient is in need of higher level of care due to hypoxia, rule out CHF. Patient is aware and verbalizes understanding of plan of care.  Vitals:   06/28/20 1549  BP: 112/66  Pulse: 95  Resp: (!) 28  Temp: 97.9 F (36.6 C)  SpO2: (!) 75%

## 2020-06-29 LAB — COMPREHENSIVE METABOLIC PANEL
ALT: 25 U/L (ref 0–44)
AST: 36 U/L (ref 15–41)
Albumin: 2.9 g/dL — ABNORMAL LOW (ref 3.5–5.0)
Alkaline Phosphatase: 101 U/L (ref 38–126)
Anion gap: 9 (ref 5–15)
BUN: 11 mg/dL (ref 8–23)
CO2: 29 mmol/L (ref 22–32)
Calcium: 8.2 mg/dL — ABNORMAL LOW (ref 8.9–10.3)
Chloride: 101 mmol/L (ref 98–111)
Creatinine, Ser: 0.55 mg/dL (ref 0.44–1.00)
GFR, Estimated: >60 mL/min (ref >60)
Glucose, Bld: 198 mg/dL — ABNORMAL HIGH (ref 70–99)
Potassium: 3.5 mmol/L (ref 3.5–5.1)
Sodium: 139 mmol/L (ref 135–145)
Total Bilirubin: 0.6 mg/dL (ref 0.3–1.2)
Total Protein: 6.3 g/dL — ABNORMAL LOW (ref 6.5–8.1)

## 2020-06-29 LAB — CBC WITH DIFFERENTIAL/PLATELET
Abs Immature Granulocytes: 0.06 10*3/uL (ref 0.00–0.07)
Basophils Absolute: 0 10*3/uL (ref 0.0–0.1)
Basophils Relative: 0 %
Eosinophils Absolute: 0 10*3/uL (ref 0.0–0.5)
Eosinophils Relative: 0 %
HCT: 37.8 % (ref 36.0–46.0)
Hemoglobin: 11.6 g/dL — ABNORMAL LOW (ref 12.0–15.0)
Immature Granulocytes: 1 %
Lymphocytes Relative: 26 %
Lymphs Abs: 1.5 10*3/uL (ref 0.7–4.0)
MCH: 29.8 pg (ref 26.0–34.0)
MCHC: 30.7 g/dL (ref 30.0–36.0)
MCV: 97.2 fL (ref 80.0–100.0)
Monocytes Absolute: 0.1 10*3/uL (ref 0.1–1.0)
Monocytes Relative: 2 %
Neutro Abs: 4.2 10*3/uL (ref 1.7–7.7)
Neutrophils Relative %: 71 %
Platelets: 307 10*3/uL (ref 150–400)
RBC: 3.89 MIL/uL (ref 3.87–5.11)
RDW: 13.8 % (ref 11.5–15.5)
WBC: 5.9 10*3/uL (ref 4.0–10.5)
nRBC: 0 % (ref 0.0–0.2)

## 2020-06-29 LAB — GLUCOSE, CAPILLARY
Glucose-Capillary: 130 mg/dL — ABNORMAL HIGH (ref 70–99)
Glucose-Capillary: 100 mg/dL — ABNORMAL HIGH (ref 70–99)
Glucose-Capillary: 200 mg/dL — ABNORMAL HIGH (ref 70–99)
Glucose-Capillary: 117 mg/dL — ABNORMAL HIGH (ref 70–99)

## 2020-06-29 LAB — HIV ANTIBODY (ROUTINE TESTING W REFLEX): HIV Screen 4th Generation wRfx: NONREACTIVE

## 2020-06-29 LAB — PHOSPHORUS: Phosphorus: 2.8 mg/dL (ref 2.5–4.6)

## 2020-06-29 LAB — MAGNESIUM: Magnesium: 1.1 mg/dL — ABNORMAL LOW (ref 1.7–2.4)

## 2020-06-29 LAB — C-REACTIVE PROTEIN: CRP: 2.9 mg/dL — ABNORMAL HIGH (ref <1.0)

## 2020-06-29 LAB — D-DIMER, QUANTITATIVE: D-Dimer, Quant: 1.06 ug/mL-FEU — ABNORMAL HIGH (ref 0.00–0.50)

## 2020-06-29 LAB — LACTIC ACID, PLASMA: Lactic Acid, Venous: 1.2 mmol/L (ref 0.5–1.9)

## 2020-06-29 LAB — FERRITIN: Ferritin: 45 ng/mL (ref 11–307)

## 2020-06-29 MED ORDER — PREDNISONE 20 MG PO TABS: 50.0000 mg | ORAL_TABLET | Freq: Every day | ORAL | Status: DC

## 2020-06-29 MED ORDER — MONTELUKAST SODIUM 10 MG PO TABS
10.0000 mg | ORAL_TABLET | Freq: Every day | ORAL | Status: DC
  Administered 2020-06-29 – 2020-06-30 (×2): 10 mg via ORAL
  Filled 2020-06-29 (×2): qty 1

## 2020-06-29 MED ORDER — ARIPIPRAZOLE 5 MG PO TABS
5.0000 mg | ORAL_TABLET | Freq: Every day | ORAL | Status: DC
  Administered 2020-06-29 – 2020-06-30 (×2): 5 mg via ORAL
  Filled 2020-06-29 (×2): qty 1

## 2020-06-29 MED ORDER — ESCITALOPRAM OXALATE 10 MG PO TABS
20.0000 mg | ORAL_TABLET | Freq: Every day | ORAL | Status: DC
Start: 2020-06-29 — End: 2020-07-01
  Administered 2020-06-29 – 2020-07-01 (×3): 20 mg via ORAL
  Filled 2020-06-29 (×3): qty 2

## 2020-06-29 MED ORDER — GABAPENTIN 300 MG PO CAPS
600.0000 mg | ORAL_CAPSULE | Freq: Three times a day (TID) | ORAL | Status: DC
  Administered 2020-06-29 – 2020-07-01 (×7): 600 mg via ORAL
  Filled 2020-06-29 (×7): qty 2

## 2020-06-29 MED ORDER — HYDROXYZINE PAMOATE 50 MG PO CAPS: 50.0000 mg | ORAL_CAPSULE | Freq: Four times a day (QID) | ORAL | Status: DC

## 2020-06-29 MED ORDER — METHYLPREDNISOLONE SODIUM SUCC 40 MG IJ SOLR
40.0000 mg | Freq: Two times a day (BID) | INTRAMUSCULAR | Status: AC
  Administered 2020-06-29 – 2020-07-01 (×4): 40 mg via INTRAVENOUS
  Filled 2020-06-29 (×4): qty 1

## 2020-06-29 MED ORDER — GUAIFENESIN ER 600 MG PO TB12
600.0000 mg | ORAL_TABLET | Freq: Two times a day (BID) | ORAL | Status: DC
  Administered 2020-06-29 – 2020-06-30 (×3): 600 mg via ORAL
  Filled 2020-06-29 (×3): qty 1

## 2020-06-29 MED ORDER — ALBUTEROL SULFATE HFA 108 (90 BASE) MCG/ACT IN AERS: 2.0000 | INHALATION_SPRAY | Freq: Four times a day (QID) | RESPIRATORY_TRACT | Status: DC | PRN

## 2020-06-29 MED ORDER — MOMETASONE FURO-FORMOTEROL FUM 200-5 MCG/ACT IN AERO
2.0000 | INHALATION_SPRAY | Freq: Two times a day (BID) | RESPIRATORY_TRACT | Status: DC
  Administered 2020-06-29 – 2020-07-01 (×5): 2 via RESPIRATORY_TRACT
  Filled 2020-06-29: qty 8.8

## 2020-06-29 MED ORDER — OXYCODONE-ACETAMINOPHEN 5-325 MG PO TABS
1.0000 | ORAL_TABLET | Freq: Four times a day (QID) | ORAL | Status: DC | PRN
Start: 2020-06-29 — End: 2020-07-01
  Administered 2020-06-29 – 2020-07-01 (×4): 1 via ORAL
  Filled 2020-06-29 (×4): qty 1

## 2020-06-29 MED ORDER — ESCITALOPRAM OXALATE 10 MG PO TABS: 20.0000 mg | ORAL_TABLET | Freq: Every day | ORAL | Status: DC

## 2020-06-29 MED ORDER — MIRTAZAPINE 30 MG PO TABS
30.0000 mg | ORAL_TABLET | Freq: Every day | ORAL | Status: DC
  Administered 2020-06-29 – 2020-06-30 (×2): 30 mg via ORAL
  Filled 2020-06-29 (×2): qty 1

## 2020-06-29 MED ORDER — NICOTINE 21 MG/24HR TD PT24
21.0000 mg | MEDICATED_PATCH | Freq: Every day | TRANSDERMAL | Status: DC
  Administered 2020-06-29 – 2020-07-01 (×3): 21 mg via TRANSDERMAL
  Filled 2020-06-29 (×3): qty 1

## 2020-06-29 MED ORDER — OXYCODONE-ACETAMINOPHEN 10-325 MG PO TABS: 1.0000 | ORAL_TABLET | Freq: Four times a day (QID) | ORAL | Status: DC | PRN

## 2020-06-29 MED ORDER — IPRATROPIUM-ALBUTEROL 20-100 MCG/ACT IN AERS
1.0000 | INHALATION_SPRAY | Freq: Four times a day (QID) | RESPIRATORY_TRACT | Status: DC
  Administered 2020-06-29 – 2020-06-30 (×4): 1 via RESPIRATORY_TRACT
  Filled 2020-06-29: qty 4

## 2020-06-29 MED ORDER — QUETIAPINE FUMARATE ER 50 MG PO TB24
300.0000 mg | ORAL_TABLET | Freq: Every day | ORAL | Status: DC
  Administered 2020-06-29 – 2020-06-30 (×2): 300 mg via ORAL
  Filled 2020-06-29 (×2): qty 6

## 2020-06-29 MED ORDER — PANTOPRAZOLE SODIUM 40 MG PO TBEC
40.0000 mg | DELAYED_RELEASE_TABLET | Freq: Every day | ORAL | Status: DC
  Administered 2020-06-29 – 2020-07-01 (×3): 40 mg via ORAL
  Filled 2020-06-29 (×3): qty 1

## 2020-06-29 MED ORDER — CYCLOBENZAPRINE HCL 10 MG PO TABS
10.0000 mg | ORAL_TABLET | Freq: Three times a day (TID) | ORAL | Status: DC
  Administered 2020-06-29 – 2020-07-01 (×7): 10 mg via ORAL
  Filled 2020-06-29 (×7): qty 1

## 2020-06-29 MED ORDER — OXYCODONE HCL 5 MG PO TABS
5.0000 mg | ORAL_TABLET | Freq: Four times a day (QID) | ORAL | Status: DC | PRN
Start: 2020-06-29 — End: 2020-07-01
  Administered 2020-06-29 – 2020-07-01 (×4): 5 mg via ORAL
  Filled 2020-06-29 (×4): qty 1

## 2020-06-29 NOTE — Progress Notes (Signed)
Patient Demographics:    Holly Phillips, is a 65 y.o. female, DOB - 1955-08-17, ZOX:[id number]  Admit date - 06/28/2020   Admitting Physician Bernadette Hoit, DO  Outpatient Primary MD for the patient is Inda Coke, [place]  LOS - 1   Chief Complaint  Patient presents with  . Shortness of Breath        Subjective:    Holly Phillips today has no fevers, no emesis,  No chest pain,   Significant coughing spells, hypoxia and dyspnea persist  No Nausea, Vomiting or Diarrhea    Assessment  & Plan :    Principal Problem:   COVID-19 virus infection Active Problems:   Tobacco abuse   COPD (chronic obstructive pulmonary disease) (HCC)   Gastroesophageal reflux disease   Acute respiratory failure with hypoxia (H[facility])   Hypokalemia   Lactic acidosis  Brief Summary:- 65 y.o. female with medical history significant for COPD (not on home oxygen) arthritis, tobacco/vape use, GERD admitted on 06/28/20 with acute hypoxic respiratory failure secondary to Covid Resp infection and some component of COPD exacerbation  A/p  1)Acute hypoxic respiratory failure secondary to COVID-19 infection/Pneumonia--- The treatment plan and use of medications  for treatment of COVID-19 infection and possible side effects were discussed with patient -Currently requiring 2 L of oxygen via nasal cannula -----Patient verbalizes understanding and agrees to treatment protocols   --  patient is tachypneic/hypoxic and requiring continuous supplemental oxygen---patient meets criteria for initiation of Remdesivir AND Steroid therapy per protocol  --Check and trend inflammatory markers including D-dimer, ferritin and  CRP---also follow CBC and CMP --Supplemental oxygen to keep O2 sats above 93% -Follow serial chest x-rays and ABGs as indicated --- Encourage prone positioning for More than 16 hours/day in increments of 2 to 3  hours at a time if able to tolerate --Attempt to maintain euvolemic state --Zinc and vitamin C as ordered -Albuterol inhaler as needed -Accu-Cheks/fingersticks while on high-dose steroids -PPI while on high-dose steroids COVID-19 Labs  Recent Labs    06/28/20 2022 06/29/20 0236  DDIMER 0.83* 1.06*  FERRITIN 48 45  LDH 254*  --   CRP 2.6* 2.9*    2) acute COPD exacerbation/ongoing tobacco abuse----nicotine patch as ordered -Chest x-ray without definite pneumonia ---c/n steroids, bronchodilators, mucolytics and supplemental oxygen -Add azithromycin  3)Depression/anxiety/chronic pain syndrome--- resume home regimen including Abilify, Lexapro, gabapentin, Remeron, Seroquel and as needed Oxycodone  4) acute hypoxic respiratory failure secondary to #1 and #2 above manage as above #1 #2  5) hyperglycemia--no prior diagnosis of diabetes, anticipate worsening glycemic control while on steroids  Disposition/Need for in-Hospital Stay- patient unable to be discharged at this time due to --- acute hypoxic respiratory failure secondary to COPD exacerbation and COVID-19 infection requiring IV steroids , suplemental oxygen*  Status is: Inpatient  Remains inpatient appropriate because:Please see above   Disposition: The patient is from: Home              Anticipated d/c is to: Home              Anticipated d/c date is: 2 days              Patient currently is not medically stable to d/c. Barriers: Not Clinically Stable-  Code Status :  -  Code Status: Full Code   Family Communication:    NA (patient is alert, awake and coherent)   Consults  :  na  DVT Prophylaxis  :   - SCDs   enoxaparin (LOVENOX) injection 40 mg Start: 06/28/20 2000 SCDs Start: 06/28/20 1955    Lab Results  Component Value Date   PLT 307 06/29/2020    Inpatient Medications  Scheduled Meds: . ARIPiprazole  5 mg Oral QHS  . vitamin C  500 mg Oral Daily  . cyclobenzaprine  10 mg Oral TID  .  dextromethorphan-guaiFENesin  1 tablet Oral BID  . enoxaparin (LOVENOX) injection  40 mg Subcutaneous Q24H  . escitalopram  20 mg Oral Daily  . gabapentin  600 mg Oral TID  . guaiFENesin  600 mg Oral BID  . hydrOXYzine  50 mg Oral QID  . insulin aspart  0-5 Units Subcutaneous QHS  . insulin aspart  0-9 Units Subcutaneous TID WC  . Ipratropium-Albuterol  1 puff Inhalation QID  . methylPREDNISolone (SOLU-MEDROL) injection  40 mg Intravenous Q12H   Followed by  . [START ON 07/01/2020] predniSONE  50 mg Oral Daily  . mirtazapine  30 mg Oral QHS  . mometasone-formoterol  2 puff Inhalation BID  . montelukast  10 mg Oral QHS  . nicotine  21 mg Transdermal Daily  . pantoprazole  40 mg Oral Daily  . QUEtiapine Fumarate  300 mg Oral QHS  . zinc sulfate  220 mg Oral Daily   Continuous Infusions: . remdesivir 100 mg in NS 100 mL     PRN Meds:.acetaminophen, albuterol, chlorpheniramine-HYDROcodone, guaiFENesin-dextromethorphan, oxyCODONE-acetaminophen    Anti-infectives (From admission, onward)   Start     Dose/Rate Route Frequency Ordered Stop   06/29/20 1400  remdesivir 100 mg in sodium chloride 0.9 % 100 mL IVPB        100 mg 200 mL/hr over 30 Minutes Intravenous Daily 06/28/20 1933 07/03/20 0959   06/28/20 1945  remdesivir 100 mg in sodium chloride 0.9 % 100 mL IVPB       "And" Linked Group Details   100 mg 200 mL/hr over 30 Minutes Intravenous  Once 06/28/20 1933 06/28/20 2021   06/28/20 1945  remdesivir 100 mg in sodium chloride 0.9 % 100 mL IVPB       "And" Linked Group Details   100 mg 200 mL/hr over 30 Minutes Intravenous  Once 06/28/20 1933 06/28/20 2057        Objective:   Vitals:   06/29/20 0100 06/29/20 0147 06/29/20 0639 06/29/20 0746  BP: (!) 142/76 (!) 164/99 (!) 148/82   Pulse: 75 93 86   Resp: 11 20 20    Temp:  97.6 F (36.4 C)    TempSrc:  Oral    SpO2: 97% 95% 97% 97%  Weight:  70.2 kg    Height:  5' (1.524 m)      Wt Readings from Last 3 Encounters:   06/29/20 70.2 kg  12/27/19 59.4 kg  12/16/19 58.1 kg     Intake/Output Summary (Last 24 hours) at 06/29/2020 1132 Last data filed at 06/29/2020 1000 Gross per 24 hour  Intake 240 ml  Output --  Net 240 ml   Physical Exam  Gen:- Awake Alert,  In no apparent distress HEENT:- [place].AT, No sclera icterus Nose- [place] 2L/min Neck-Supple Neck,No JVD,.  Lungs-diminished breath sounds with scattered wheezes CV- S1, S2 normal, regular  Abd-  +ve B.Sounds, Abd Soft, No tenderness,  Extremity/Skin:- No  edema, pedal pulses present  Psych-affect is appropriate, oriented x3 Neuro-no new focal deficits, no tremors   Data Review:   Micro Results Recent Results (from the past 240 hour(s))  Blood Culture (routine x 2)     Status: None (Preliminary result)   Collection Time: 06/28/20  8:00 PM   Specimen: BLOOD  Result Value Ref Range Status   Specimen Description BLOOD  Final   Special Requests NONE  Final   Culture   Final    NO GROWTH < 12 HOURS Performed at [facility], [address] [place], [place] [address]    Report Status PENDING  Incomplete  Blood Culture (routine x 2)     Status: None (Preliminary result)   Collection Time: 06/28/20  8:10 PM   Specimen: BLOOD  Result Value Ref Range Status   Specimen Description BLOOD  Final   Special Requests NONE  Final   Culture   Final    NO GROWTH < 12 HOURS Performed at [facility], [address] [place], [place] [address]    Report Status PENDING  Incomplete    Radiology Reports DG Chest Portable 1 View  Result Date: 06/28/2020 CLINICAL DATA:  Short of breath EXAM: PORTABLE CHEST 1 VIEW COMPARISON:  None. FINDINGS: Single frontal view of the chest demonstrates an unremarkable cardiac silhouette. Linear density left lung base likely atelectasis. No airspace disease, effusion, or pneumothorax. No acute bony abnormalities. IMPRESSION: 1. No acute intrathoracic process. Electronically Signed   By: Randa Ngo M.D.   On:  06/28/2020 18:45     CBC Recent Labs  Lab 06/28/20 1711 06/29/20 0236  WBC 9.6 5.9  HGB 12.0 11.6*  HCT 38.9 37.8  PLT 304 307  MCV 98.2 97.2  MCH 30.3 29.8  MCHC 30.8 30.7  RDW 13.9 13.8  LYMPHSABS  --  1.5  MONOABS  --  0.1  EOSABS  --  0.0  BASOSABS  --  0.0    Chemistries  Recent Labs  Lab 06/28/20 1711 06/28/20 2022 06/29/20 0236  NA 138  --  139  K 3.3*  --  3.5  CL 102  --  101  CO2 28  --  29  GLUCOSE 100*  --  198*  BUN 11  --  11  CREATININE 0.65  --  0.55  CALCIUM 8.2*  --  8.2*  MG  --   --  1.1*  AST  --  41 36  ALT  --  23 25  ALKPHOS  --  95 101  BILITOT  --  0.5 0.6   ------------------------------------------------------------------------------------------------------------------ Recent Labs    06/28/20 2022  TRIG 68    No results found for: HGBA1C ------------------------------------------------------------------------------------------------------------------ No results for input(s): TSH, T4TOTAL, T3FREE, THYROIDAB in the last 72 hours.  Invalid input(s): FREET3 ------------------------------------------------------------------------------------------------------------------ Recent Labs    06/28/20 2022 06/29/20 0236  FERRITIN 48 45    Coagulation profile No results for input(s): INR, PROTIME in the last 168 hours.  Recent Labs    06/28/20 2022 06/29/20 0236  DDIMER 0.83* 1.06*    Cardiac Enzymes No results for input(s): CKMB, TROPONINI, MYOGLOBIN in the last 168 hours.  Invalid input(s): CK ------------------------------------------------------------------------------------------------------------------ No results found for: BNP  Holly Phillips M.D on 06/29/2020 at 11:32 AM  Go to [email or web address] - for contact info  [facility] - Office  [phone]

## 2020-06-30 LAB — CBC WITH DIFFERENTIAL/PLATELET
Abs Immature Granulocytes: 0.05 10*3/uL (ref 0.00–0.07)
Basophils Absolute: 0 10*3/uL (ref 0.0–0.1)
Basophils Relative: 0 %
Eosinophils Absolute: 0 10*3/uL (ref 0.0–0.5)
Eosinophils Relative: 0 %
HCT: 38.8 % (ref 36.0–46.0)
Hemoglobin: 11.7 g/dL — ABNORMAL LOW (ref 12.0–15.0)
Immature Granulocytes: 1 %
Lymphocytes Relative: 18 %
Lymphs Abs: 1.5 10*3/uL (ref 0.7–4.0)
MCH: 29.4 pg (ref 26.0–34.0)
MCHC: 30.2 g/dL (ref 30.0–36.0)
MCV: 97.5 fL (ref 80.0–100.0)
Monocytes Absolute: 0.4 10*3/uL (ref 0.1–1.0)
Monocytes Relative: 5 %
Neutro Abs: 6.3 10*3/uL (ref 1.7–7.7)
Neutrophils Relative %: 76 %
Platelets: 352 10*3/uL (ref 150–400)
RBC: 3.98 MIL/uL (ref 3.87–5.11)
RDW: 14.2 % (ref 11.5–15.5)
WBC: 8.3 10*3/uL (ref 4.0–10.5)
nRBC: 0 % (ref 0.0–0.2)

## 2020-06-30 LAB — PHOSPHORUS: Phosphorus: 2.4 mg/dL — ABNORMAL LOW (ref 2.5–4.6)

## 2020-06-30 LAB — COMPREHENSIVE METABOLIC PANEL
ALT: 20 U/L (ref 0–44)
AST: 25 U/L (ref 15–41)
Albumin: 2.7 g/dL — ABNORMAL LOW (ref 3.5–5.0)
Alkaline Phosphatase: 89 U/L (ref 38–126)
Anion gap: 6 (ref 5–15)
BUN: 9 mg/dL (ref 8–23)
CO2: 32 mmol/L (ref 22–32)
Calcium: 8.3 mg/dL — ABNORMAL LOW (ref 8.9–10.3)
Chloride: 105 mmol/L (ref 98–111)
Creatinine, Ser: 0.52 mg/dL (ref 0.44–1.00)
GFR, Estimated: >60 mL/min (ref >60)
Glucose, Bld: 146 mg/dL — ABNORMAL HIGH (ref 70–99)
Potassium: 3.9 mmol/L (ref 3.5–5.1)
Sodium: 143 mmol/L (ref 135–145)
Total Bilirubin: 0.5 mg/dL (ref 0.3–1.2)
Total Protein: 5.9 g/dL — ABNORMAL LOW (ref 6.5–8.1)

## 2020-06-30 LAB — GLUCOSE, CAPILLARY
Glucose-Capillary: 144 mg/dL — ABNORMAL HIGH (ref 70–99)
Glucose-Capillary: 140 mg/dL — ABNORMAL HIGH (ref 70–99)
Glucose-Capillary: 115 mg/dL — ABNORMAL HIGH (ref 70–99)
Glucose-Capillary: 91 mg/dL (ref 70–99)

## 2020-06-30 LAB — FERRITIN: Ferritin: 36 ng/mL (ref 11–307)

## 2020-06-30 LAB — C-REACTIVE PROTEIN: CRP: 1.6 mg/dL — ABNORMAL HIGH (ref <1.0)

## 2020-06-30 LAB — D-DIMER, QUANTITATIVE: D-Dimer, Quant: 0.84 ug/mL-FEU — ABNORMAL HIGH (ref 0.00–0.50)

## 2020-06-30 LAB — MAGNESIUM: Magnesium: 1.2 mg/dL — ABNORMAL LOW (ref 1.7–2.4)

## 2020-06-30 MED ORDER — LABETALOL HCL 5 MG/ML IV SOLN
10.0000 mg | INTRAVENOUS | Status: DC | PRN
  Administered 2020-07-01: 10 mg via INTRAVENOUS
  Filled 2020-06-30: qty 4

## 2020-06-30 MED ORDER — IPRATROPIUM-ALBUTEROL 20-100 MCG/ACT IN AERS: 1.0000 | INHALATION_SPRAY | Freq: Four times a day (QID) | RESPIRATORY_TRACT | Status: DC

## 2020-06-30 MED ORDER — IPRATROPIUM-ALBUTEROL 20-100 MCG/ACT IN AERS
1.0000 | INHALATION_SPRAY | Freq: Four times a day (QID) | RESPIRATORY_TRACT | Status: DC
  Administered 2020-06-30 (×2): 1 via RESPIRATORY_TRACT

## 2020-06-30 NOTE — Progress Notes (Signed)
Patient Demographics:    Holly Phillips, is a 65 y.o. female, DOB - [birth year], ZSW:[id number]  Admit date - 06/28/2020   Admitting Physician Bernadette Hoit, DO  Outpatient Primary MD for the patient is Inda Coke, [place]  LOS - 2   Chief Complaint  Patient presents with  . Shortness of Breath        Subjective:    Holly Phillips today has no fevers, no emesis,  No chest pain,    -Desaturated and became very short of breath with ambulation from the bed to the door    Assessment  & Plan :    Principal Problem:   COVID-19 virus infection Active Problems:   Tobacco abuse   COPD (chronic obstructive pulmonary disease) (HCC)   Gastroesophageal reflux disease   Acute respiratory failure with hypoxia (HCC)   Hypokalemia   Lactic acidosis  Brief Summary:- 65 y.o. female with medical history significant for COPD (not on home oxygen) arthritis, tobacco/vape use, GERD admitted on 06/28/20 with acute hypoxic respiratory failure secondary to Covid Resp infection and some component of COPD exacerbation  A/p  1)Acute hypoxic respiratory failure secondary to COVID-19 infection/Pneumonia--- The treatment plan and use of medications  for treatment of COVID-19 infection and possible side effects were discussed with patient -Currently requiring 2 L of oxygen via nasal cannula--desaturates quickly with ambulation -----Patient verbalizes understanding and agrees to treatment protocols   --  patient is tachypneic/hypoxic and requiring continuous supplemental oxygen---patient meets criteria for initiation of Remdesivir AND Steroid therapy per protocol  --Check and trend inflammatory markers including D-dimer, ferritin and  CRP---also follow CBC and CMP --Supplemental oxygen to keep O2 sats above 93% -Follow serial chest x-rays and ABGs as indicated --- Encourage prone positioning for More than 16  hours/day in increments of 2 to 3 hours at a time if able to tolerate --Attempt to maintain euvolemic state --Zinc and vitamin C as ordered -Albuterol inhaler as needed -Accu-Cheks/fingersticks while on high-dose steroids -PPI while on high-dose steroids COVID-19 Labs  Recent Labs    06/28/20 2022 06/29/20 0236 06/30/20 0700  DDIMER 0.83* 1.06* 0.84*  FERRITIN 48 45 36  LDH 254*  --   --   CRP 2.6* 2.9* 1.6*    2) acute COPD exacerbation/ongoing tobacco abuse----nicotine patch as ordered -Chest x-ray without definite pneumonia ---c/n steroids, bronchodilators, mucolytics and supplemental oxygen C/n Azithromycin  3)Depression/anxiety/chronic pain syndrome--- resume home regimen including Abilify, Lexapro, gabapentin, Remeron, Seroquel and as needed Oxycodone  4) acute hypoxic respiratory failure secondary to #1 and #2 above manage as above #1 #2 --desaturates quickly with ambulation   5)Hyperglycemia--no prior diagnosis of diabetes, anticipate worsening glycemic control while on steroids  Disposition/Need for in-Hospital Stay- patient unable to be discharged at this time due to --- acute hypoxic respiratory failure secondary to COPD exacerbation and COVID-19 infection requiring IV steroids , suplemental oxygen*  Status is: Inpatient  Remains inpatient appropriate because:Please see above   Disposition: The patient is from: Home              Anticipated d/c is to: Home              Anticipated d/c date is: 2 days  Patient currently is not medically stable to d/c. Barriers: Not Clinically Stable-   Code Status :  -  Code Status: Full Code   Family Communication:    NA (patient is alert, awake and coherent)   Consults  :  na  DVT Prophylaxis  :   - SCDs   enoxaparin (LOVENOX) injection 40 mg Start: 06/28/20 2000 SCDs Start: 06/28/20 1955    Lab Results  Component Value Date   PLT 352 06/30/2020    Inpatient Medications  Scheduled Meds: .  ARIPiprazole  5 mg Oral QHS  . vitamin C  500 mg Oral Daily  . cyclobenzaprine  10 mg Oral TID  . dextromethorphan-guaiFENesin  1 tablet Oral BID  . enoxaparin (LOVENOX) injection  40 mg Subcutaneous Q24H  . escitalopram  20 mg Oral Daily  . gabapentin  600 mg Oral TID  . guaiFENesin  600 mg Oral BID  . insulin aspart  0-5 Units Subcutaneous QHS  . insulin aspart  0-9 Units Subcutaneous TID WC  . Ipratropium-Albuterol  1 puff Inhalation Q6H  . methylPREDNISolone (SOLU-MEDROL) injection  40 mg Intravenous Q12H   Followed by  . [START ON 07/01/2020] predniSONE  50 mg Oral Daily  . mirtazapine  30 mg Oral QHS  . mometasone-formoterol  2 puff Inhalation BID  . montelukast  10 mg Oral QHS  . nicotine  21 mg Transdermal Daily  . pantoprazole  40 mg Oral Daily  . QUEtiapine Fumarate  300 mg Oral QHS  . zinc sulfate  220 mg Oral Daily   Continuous Infusions: . remdesivir 100 mg in NS 100 mL 100 mg (06/30/20 0907)   PRN Meds:.acetaminophen, albuterol, chlorpheniramine-HYDROcodone, guaiFENesin-dextromethorphan, labetalol, oxyCODONE-acetaminophen **AND** oxyCODONE    Anti-infectives (From admission, onward)   Start     Dose/Rate Route Frequency Ordered Stop   06/29/20 1400  remdesivir 100 mg in sodium chloride 0.9 % 100 mL IVPB        100 mg 200 mL/hr over 30 Minutes Intravenous Daily 06/28/20 1933 07/03/20 0959   06/28/20 1945  remdesivir 100 mg in sodium chloride 0.9 % 100 mL IVPB       "And" Linked Group Details   100 mg 200 mL/hr over 30 Minutes Intravenous  Once 06/28/20 1933 06/28/20 2021   06/28/20 1945  remdesivir 100 mg in sodium chloride 0.9 % 100 mL IVPB       "And" Linked Group Details   100 mg 200 mL/hr over 30 Minutes Intravenous  Once 06/28/20 1933 06/28/20 2057        Objective:   Vitals:   06/30/20 0602 06/30/20 0848 06/30/20 1228 06/30/20 1440  BP: (!) 172/82  (!) 161/90   Pulse: 98  86   Resp: 17  20   Temp: (!) 97.3 F (36.3 C)  97.7 F (36.5 C)    TempSrc:   Oral   SpO2: 93% 97% 100% 98%  Weight:      Height:        Wt Readings from Last 3 Encounters:  06/29/20 70.2 kg  12/27/19 59.4 kg  12/16/19 58.1 kg     Intake/Output Summary (Last 24 hours) at 06/30/2020 1554 Last data filed at 06/30/2020 1500 Gross per 24 hour  Intake 642.48 ml  Output --  Net 642.48 ml   Physical Exam  Gen:- Awake Alert,  In no apparent distress HEENT:- [place].AT, No sclera icterus Nose- [place] 2L/min Neck-Supple Neck,No JVD,.  Lungs-diminished breath sounds with scattered wheezes CV- S1, S2  normal, regular  Abd-  +ve B.Sounds, Abd Soft, No tenderness,    Extremity/Skin:- No  edema, pedal pulses present  Psych-affect is appropriate, oriented x3 Neuro-no new focal deficits, no tremors   Data Review:   Micro Results Recent Results (from the past 240 hour(s))  Blood Culture (routine x 2)     Status: None (Preliminary result)   Collection Time: 06/28/20  8:00 PM   Specimen: BLOOD  Result Value Ref Range Status   Specimen Description BLOOD  Final   Special Requests NONE  Final   Culture   Final    NO GROWTH 2 DAYS Performed at [facility], [address], [place], [place] [address]    Report Status PENDING  Incomplete  Blood Culture (routine x 2)     Status: None (Preliminary result)   Collection Time: 06/28/20  8:10 PM   Specimen: BLOOD  Result Value Ref Range Status   Specimen Description BLOOD  Final   Special Requests NONE  Final   Culture   Final    NO GROWTH 2 DAYS Performed at [facility], [address], [place], [place] [address]    Report Status PENDING  Incomplete    Radiology Reports DG Chest Portable 1 View  Result Date: 06/28/2020 CLINICAL DATA:  Short of breath EXAM: PORTABLE CHEST 1 VIEW COMPARISON:  None. FINDINGS: Single frontal view of the chest demonstrates an unremarkable cardiac silhouette. Linear density left lung base likely atelectasis. No airspace disease, effusion, or pneumothorax. No acute bony  abnormalities. IMPRESSION: 1. No acute intrathoracic process. Electronically Signed   By: Randa Ngo M.D.   On: 06/28/2020 18:45     CBC Recent Labs  Lab 06/28/20 1711 06/29/20 0236 06/30/20 0700  WBC 9.6 5.9 8.3  HGB 12.0 11.6* 11.7*  HCT 38.9 37.8 38.8  PLT 304 307 352  MCV 98.2 97.2 97.5  MCH 30.3 29.8 29.4  MCHC 30.8 30.7 30.2  RDW 13.9 13.8 14.2  LYMPHSABS  --  1.5 1.5  MONOABS  --  0.1 0.4  EOSABS  --  0.0 0.0  BASOSABS  --  0.0 0.0    Chemistries  Recent Labs  Lab 06/28/20 1711 06/28/20 2022 06/29/20 0236 06/30/20 0700  NA 138  --  139 143  K 3.3*  --  3.5 3.9  CL 102  --  101 105  CO2 28  --  29 32  GLUCOSE 100*  --  198* 146*  BUN 11  --  11 9  CREATININE 0.65  --  0.55 0.52  CALCIUM 8.2*  --  8.2* 8.3*  MG  --   --  1.1* 1.2*  AST  --  41 36 25  ALT  --  23 25 20   ALKPHOS  --  95 101 89  BILITOT  --  0.5 0.6 0.5   ------------------------------------------------------------------------------------------------------------------ Recent Labs    06/28/20 2022  TRIG 68    No results found for: HGBA1C ------------------------------------------------------------------------------------------------------------------ No results for input(s): TSH, T4TOTAL, T3FREE, THYROIDAB in the last 72 hours.  Invalid input(s): FREET3 ------------------------------------------------------------------------------------------------------------------ Recent Labs    06/29/20 0236 06/30/20 0700  FERRITIN 45 36    Coagulation profile No results for input(s): INR, PROTIME in the last 168 hours.  Recent Labs    06/29/20 0236 06/30/20 0700  DDIMER 1.06* 0.84*    Cardiac Enzymes No results for input(s): CKMB, TROPONINI, MYOGLOBIN in the last 168 hours.  Invalid input(s): CK ------------------------------------------------------------------------------------------------------------------ No results found for: BNP  Holly Phillips  Holly Phillips M.D on 06/30/2020 at 3:54  PM  Go to [email or web address] - for contact info  [facility] - Office  [phone]

## 2020-07-01 LAB — COMPREHENSIVE METABOLIC PANEL
ALT: 19 U/L (ref 0–44)
AST: 23 U/L (ref 15–41)
Albumin: 2.9 g/dL — ABNORMAL LOW (ref 3.5–5.0)
Alkaline Phosphatase: 89 U/L (ref 38–126)
Anion gap: 10 (ref 5–15)
BUN: 13 mg/dL (ref 8–23)
CO2: 31 mmol/L (ref 22–32)
Calcium: 9 mg/dL (ref 8.9–10.3)
Chloride: 102 mmol/L (ref 98–111)
Creatinine, Ser: 0.52 mg/dL (ref 0.44–1.00)
GFR, Estimated: >60 mL/min (ref >60)
Glucose, Bld: 156 mg/dL — ABNORMAL HIGH (ref 70–99)
Potassium: 3.8 mmol/L (ref 3.5–5.1)
Sodium: 143 mmol/L (ref 135–145)
Total Bilirubin: 0.4 mg/dL (ref 0.3–1.2)
Total Protein: 6.2 g/dL — ABNORMAL LOW (ref 6.5–8.1)

## 2020-07-01 LAB — CBC WITH DIFFERENTIAL/PLATELET
Abs Immature Granulocytes: 0.07 10*3/uL (ref 0.00–0.07)
Basophils Absolute: 0 10*3/uL (ref 0.0–0.1)
Basophils Relative: 0 %
Eosinophils Absolute: 0 10*3/uL (ref 0.0–0.5)
Eosinophils Relative: 0 %
HCT: 38.2 % (ref 36.0–46.0)
Hemoglobin: 11.9 g/dL — ABNORMAL LOW (ref 12.0–15.0)
Immature Granulocytes: 1 %
Lymphocytes Relative: 21 %
Lymphs Abs: 1.8 10*3/uL (ref 0.7–4.0)
MCH: 30.3 pg (ref 26.0–34.0)
MCHC: 31.2 g/dL (ref 30.0–36.0)
MCV: 97.2 fL (ref 80.0–100.0)
Monocytes Absolute: 0.4 10*3/uL (ref 0.1–1.0)
Monocytes Relative: 4 %
Neutro Abs: 6.4 10*3/uL (ref 1.7–7.7)
Neutrophils Relative %: 74 %
Platelets: 412 10*3/uL — ABNORMAL HIGH (ref 150–400)
RBC: 3.93 MIL/uL (ref 3.87–5.11)
RDW: 14.3 % (ref 11.5–15.5)
WBC: 8.7 10*3/uL (ref 4.0–10.5)
nRBC: 0 % (ref 0.0–0.2)

## 2020-07-01 LAB — D-DIMER, QUANTITATIVE: D-Dimer, Quant: 1.01 ug/mL-FEU — ABNORMAL HIGH (ref 0.00–0.50)

## 2020-07-01 LAB — FERRITIN: Ferritin: 36 ng/mL (ref 11–307)

## 2020-07-01 LAB — C-REACTIVE PROTEIN: CRP: 1.3 mg/dL — ABNORMAL HIGH (ref <1.0)

## 2020-07-01 LAB — GLUCOSE, CAPILLARY
Glucose-Capillary: 123 mg/dL — ABNORMAL HIGH (ref 70–99)
Glucose-Capillary: 130 mg/dL — ABNORMAL HIGH (ref 70–99)

## 2020-07-01 LAB — MAGNESIUM: Magnesium: 1.4 mg/dL — ABNORMAL LOW (ref 1.7–2.4)

## 2020-07-01 LAB — PHOSPHORUS: Phosphorus: 2.7 mg/dL (ref 2.5–4.6)

## 2020-07-01 MED ORDER — ASCORBIC ACID 500 MG PO TABS: 500.0000 mg | ORAL_TABLET | Freq: Every day | ORAL | 2 refills | Status: DC

## 2020-07-01 MED ORDER — IPRATROPIUM-ALBUTEROL 20-100 MCG/ACT IN AERS: 1.0000 | INHALATION_SPRAY | Freq: Four times a day (QID) | RESPIRATORY_TRACT | 2 refills | Status: DC

## 2020-07-01 MED ORDER — NICOTINE 21 MG/24HR TD PT24: 21.0000 mg | MEDICATED_PATCH | Freq: Every day | TRANSDERMAL | 0 refills | Status: DC

## 2020-07-01 MED ORDER — ACETAMINOPHEN 325 MG PO TABS: 650.0000 mg | ORAL_TABLET | Freq: Four times a day (QID) | ORAL | 0 refills | Status: DC | PRN

## 2020-07-01 MED ORDER — GUAIFENESIN-DM 100-10 MG/5ML PO SYRP: 10.0000 mL | ORAL_SOLUTION | ORAL | 0 refills | Status: DC | PRN

## 2020-07-01 MED ORDER — AZITHROMYCIN 500 MG PO TABS: 500.0000 mg | ORAL_TABLET | Freq: Every day | ORAL | 0 refills | Status: DC

## 2020-07-01 MED ORDER — IPRATROPIUM-ALBUTEROL 20-100 MCG/ACT IN AERS
1.0000 | INHALATION_SPRAY | Freq: Four times a day (QID) | RESPIRATORY_TRACT | Status: DC
  Administered 2020-07-01 (×2): 1 via RESPIRATORY_TRACT

## 2020-07-01 MED ORDER — ZINC SULFATE 220 (50 ZN) MG PO CAPS: 220.0000 mg | ORAL_CAPSULE | Freq: Every day | ORAL | 1 refills | Status: DC

## 2020-07-01 MED ORDER — GUAIFENESIN ER 600 MG PO TB12
600.0000 mg | ORAL_TABLET | Freq: Two times a day (BID) | ORAL | 1 refills | Status: DC
Start: 2020-07-01 — End: 2020-07-04

## 2020-07-01 MED ORDER — PREDNISONE 50 MG PO TABS
50.0000 mg | ORAL_TABLET | Freq: Every day | ORAL | 0 refills | Status: AC
Start: 2020-07-01 — End: 2020-07-06

## 2020-07-01 NOTE — Discharge Summary (Signed)
Holly Phillips, is a 65 y.o. female  DOB Apr 14, 1956  MRN [id number].  Admission date:  06/28/2020  Admitting Physician  Bernadette Hoit, DO  Discharge Date:  07/01/2020   Primary MD  Inda Coke, PA  Recommendations for primary care physician for things to follow:   1) You are strongly advised to isolate/quarantine for at least 10 days from the date of your diagnosis with COVID-19 infection--please always wear a mask if you have to go outside the house  2)You advised to get [place] or [place]  Covid Vaccine in about  3 weeks from now to reduce your chance of getting severe Covid 19 reinfection   3)Please take medications as prescribed  4)Video/Virtual follow-up visit with primary care physician in about a week advised  5) smoking cessation strongly advised--- you may use over-the-counter nicotine patch to help you quit smoking  Admission Diagnosis  Hypoxia [R09.02] COVID-19 virus infection [U07.1] COVID-19 [U07.1]   Discharge Diagnosis  Hypoxia [R09.02] COVID-19 virus infection [U07.1] COVID-19 [U07.1]    Principal Problem:   COVID-19 virus infection Active Problems:   Tobacco abuse   COPD (chronic obstructive pulmonary disease) ([place])   Gastroesophageal reflux disease   Acute respiratory failure with hypoxia (HCC)   Hypokalemia   Lactic acidosis      Past Medical History:  Diagnosis Date  . Anxiety   . Arthritis   . Asthma   . Chronic back pain   . Colon polyps   . COPD (chronic obstructive pulmonary disease) ([place])   . Depression   . Emphysema of lung [facility])     Past Surgical History:  Procedure Laterality Date  . ABDOMINAL HYSTERECTOMY    . CERVICAL SPINE SURGERY    . INNER EAR SURGERY    . LUMBAR FUSION  2012   L3-L7     HPI  from the history and physical done on the day of admission:    Chief Complaint: Shortness of breath  HPI: Holly Phillips is a 65  y.o. female with medical history significant for COPD (not on home oxygen) arthritis, tobacco/vape use, GERD who presents to the emergency department due to worsening shortness of breath.  She complained of 2-week onset of body aches, fatigue, decreased appetite with loss of taste, shortness of breath started about 3 days ago and has been worsening, and this is associated with cough.  He also complained of bilateral ankle and feet swelling which started about 3 days ago.  She went to a local urgent care center where she was noted to be hypoxic with an O2 sat of 75% on room air.  EMS was activated and patient was taken to the ED for further evaluation and management.  Nebulizer treatment with DuoNeb x2 was provided in route to the hospital.  Patient never had COVID vaccine  ED Course: In the emergency department, was initially tachypneic, O2 sat ranged within 94-97% on supplemental oxygen at 4 PM.  Work-up in the ED showed normal CBC, hypokalemia.  SARS coronavirus 2 was positive.  Procalcitonin 0.26, CRP 2.6, D-dimer 0.83 Chest x-ray personally reviewed showed no acute pulmonary disease She was treated with IV Solu-Medrol and remdesivir.  Hospitalist was asked to admit patient for further evaluation and management.      Hospital Course:     Brief Summary:- 65 y.o.femalewith medical history significant forCOPD (not on home oxygen) arthritis, tobacco/vape use, GERD admitted on 06/28/20 with acute hypoxic respiratory failure secondary to Covid Resp infection and some component of COPD exacerbation  A/p  1)Acute hypoxic respiratory failure secondary to COVID-19 infection/Pneumonia--- The treatment plan and use of medications for treatment of COVID-19 infectionand possible side effects were discussed with patient -Patient improved significantly, no further hypoxia even with ambulation -------Attempt to maintain euvolemic state --Zinc and vitamin C as ordered -Albuterol inhaler as  needed COVID-19 Labs  Recent Labs    06/28/20 2022 06/29/20 0236 06/30/20 0700 07/01/20 0601  DDIMER 0.83* 1.06* 0.84* 1.01*  FERRITIN 48 45 36 36  LDH 254*  --   --   --   CRP 2.6* 2.9* 1.6* 1.3*   2)Acute COPD exacerbation/ongoing Tobacco abuse----nicotine patch as ordered -Chest x-ray without definite pneumonia Treated with steroids, bronchodilators, mucolytics and Azithromycin -Hypoxia resolved  3)Depression/Anxiety/chronic pain syndrome--- resume home regimen including Abilify, Lexapro, gabapentin, Remeron, Seroquel and as needed Oxycodone  4) acute hypoxic respiratory failure secondary to #1 and #2 above manage as above #1 #2 -Resolved hypoxia   5)Hyperglycemia--no prior diagnosis of diabetes, anticipate improved glycemic control with steroid taper  Disposition--- discharge home on room air   Disposition: The patient is from: Home  Anticipated d/c is to: Home   -   Code Status :  -  Code Status: Full Code   Family Communication:    NA (patient is alert, awake and coherent)   Discharge Condition: Stable without hypoxia  Follow UP--- PCP as advised Diet and Activity recommendation:  As advised  Discharge Instructions    Discharge Instructions    Call MD for:  difficulty breathing, headache or visual disturbances   Complete by: As directed    Call MD for:  persistant dizziness or light-headedness   Complete by: As directed    Call MD for:  persistant nausea and vomiting   Complete by: As directed    Call MD for:  temperature >100.4   Complete by: As directed    Diet - low sodium heart healthy   Complete by: As directed    Discharge instructions   Complete by: As directed    1) You are strongly advised to isolate/quarantine for at least 10 days from the date of your diagnosis with COVID-19 infection--please always wear a mask if you have to go outside the house  2)You advised to get [place] or Pfizer  Covid Vaccine in about   3 weeks from now to reduce your chance of getting severe Covid 19 reinfection   3)Please take medications as prescribed  4)Video/Virtual follow-up visit with primary care physician in about a week advised  5) smoking cessation strongly advised--- you may use over-the-counter nicotine patch to help you quit smoking   Increase activity slowly   Complete by: As directed         Discharge Medications     Allergies as of 07/01/2020   No Known Allergies     Medication List    TAKE these medications   acetaminophen 325 MG tablet Commonly known as: TYLENOL Take 2 tablets (650 mg total) by mouth every 6 (six) hours as needed  for mild pain or headache (fever >/= 101).   albuterol 108 (90 Base) MCG/ACT inhaler Commonly known as: VENTOLIN HFA INHALE 1 TO 2 PUFFS INTO THE LUNGS EVERY 6 HOURS AS NEEDED FOR WHEEZING OR SHORTNESS OF BREATH   ARIPiprazole 5 MG tablet Commonly known as: ABILIFY Take 5 mg by mouth at bedtime.   ascorbic acid 500 MG tablet Commonly known as: VITAMIN C Take 1 tablet (500 mg total) by mouth daily. Start taking on: July 02, 2020   atorvastatin 10 MG tablet Commonly known as: LIPITOR TAKE 1 TABLET(10 MG) BY MOUTH DAILY   azithromycin 500 MG tablet Commonly known as: ZITHROMAX Take 1 tablet (500 mg total) by mouth daily for 3 days.   budesonide-formoterol 160-4.5 MCG/ACT inhaler Commonly known as: SYMBICORT INHALE 2 PUFFS INTO THE LUNGS TWICE DAILY   celecoxib 100 MG capsule Commonly known as: CELEBREX TAKE 1 CAPSULE(100 MG) BY MOUTH DAILY   cyclobenzaprine 10 MG tablet Commonly known as: FLEXERIL Take 1 tablet (10 mg total) by mouth 3 (three) times daily.   escitalopram 20 MG tablet Commonly known as: LEXAPRO Take 20 mg by mouth daily.   gabapentin 800 MG tablet Commonly known as: NEURONTIN TAKE 1 TABLET(800 MG) BY MOUTH THREE TIMES DAILY   guaiFENesin 600 MG 12 hr tablet Commonly known as: MUCINEX Take 1 tablet (600 mg total) by  mouth 2 (two) times daily.   guaiFENesin-dextromethorphan 100-10 MG/5ML syrup Commonly known as: ROBITUSSIN DM Take 10 mLs by mouth every 4 (four) hours as needed for cough.   hydrOXYzine 50 MG capsule Commonly known as: VISTARIL Take 50 mg by mouth 4 (four) times daily.   Ipratropium-Albuterol 20-100 MCG/ACT Aers respimat Commonly known as: COMBIVENT Inhale 1 puff into the lungs every 6 (six) hours.   lidocaine 5 % Commonly known as: LIDODERM Place 3 patches onto the skin daily as needed (pain).   Linzess 290 MCG Caps capsule Generic drug: linaclotide TAKE 1 CAPSULE(290 MCG) BY MOUTH DAILY BEFORE BREAKFAST   mirtazapine 30 MG tablet Commonly known as: REMERON Take 30 mg by mouth at bedtime.   montelukast 10 MG tablet Commonly known as: SINGULAIR TAKE 1 TABLET(10 MG) BY MOUTH DAILY What changed: See the new instructions.   nicotine 21 mg/24hr patch Commonly known as: NICODERM CQ - dosed in mg/24 hours Place 1 patch (21 mg total) onto the skin daily.   omeprazole 40 MG capsule Commonly known as: PRILOSEC Take 1 capsule (40 mg total) by mouth 2 (two) times daily.   oxyCODONE-acetaminophen 10-325 MG tablet Commonly known as: PERCOCET Take 1 tablet by mouth 4 (four) times daily as needed.   predniSONE 50 MG tablet Commonly known as: DELTASONE Take 1 tablet (50 mg total) by mouth daily with breakfast for 5 days.   QUEtiapine Fumarate 150 MG 24 hr tablet Commonly known as: SEROQUEL XR Take 300 mg by mouth at bedtime.   traZODone 150 MG tablet Commonly known as: DESYREL Take 150 mg by mouth at bedtime.   zinc sulfate 220 (50 Zn) MG capsule Take 1 capsule (220 mg total) by mouth daily. Start taking on: July 02, 2020       Major procedures and Radiology Reports - PLEASE review detailed and final reports for all details, in brief -      DG Chest Portable 1 View  Result Date: 06/28/2020 CLINICAL DATA:  Short of breath EXAM: PORTABLE CHEST 1 VIEW  COMPARISON:  None. FINDINGS: Single frontal view of the chest demonstrates an unremarkable  cardiac silhouette. Linear density left lung base likely atelectasis. No airspace disease, effusion, or pneumothorax. No acute bony abnormalities. IMPRESSION: 1. No acute intrathoracic process. Electronically Signed   By: Randa Ngo M.D.   On: 06/28/2020 18:45    Micro Results     Recent Results (from the past 240 hour(s))  Blood Culture (routine x 2)     Status: None (Preliminary result)   Collection Time: 06/28/20  8:00 PM   Specimen: BLOOD  Result Value Ref Range Status   Specimen Description BLOOD  Final   Special Requests NONE  Final   Culture   Final    NO GROWTH 2 DAYS Performed at [facility], [address], [place], [place] [address]    Report Status PENDING  Incomplete  Blood Culture (routine x 2)     Status: None (Preliminary result)   Collection Time: 06/28/20  8:10 PM   Specimen: BLOOD  Result Value Ref Range Status   Specimen Description BLOOD  Final   Special Requests NONE  Final   Culture   Final    NO GROWTH 2 DAYS Performed at [facility], [address] [place], [place] [address]    Report Status PENDING  Incomplete       Today   Subjective    Shakita Holliman today has no new complaints, -Some cough persist, Hypoxia has resolved, ambulating without chest pains or significant dyspnea on exertion         Patient has been seen and examined prior to discharge   Objective   Blood pressure (!) 154/76, pulse 77, temperature 97.7 F (36.5 C), temperature source Oral, resp. rate 20, height 5' (1.524 m), weight 70.2 kg, SpO2 95 %.   Intake/Output Summary (Last 24 hours) at 07/01/2020 1403 Last data filed at 07/01/2020 1045 Gross per 24 hour  Intake 542.38 ml  Output -  Net 542.38 ml    Exam Gen:- Awake Alert, no acute distress = HEENT:- [place].AT, No sclera icterus Neck-Supple Neck,No JVD,.  Lungs-improved air movement bilaterally, no wheezing  CV-  S1, S2 normal, regular Abd-  +ve B.Sounds, Abd Soft, No tenderness,    Extremity/Skin:- No  edema,   good pulses Psych-affect is appropriate, oriented x3 Neuro-no new focal deficits, no tremors    Data Review   CBC w Diff:  Lab Results  Component Value Date   WBC 8.7 07/01/2020   HGB 11.9 (L) 07/01/2020   HCT 38.2 07/01/2020   PLT 412 (H) 07/01/2020   LYMPHOPCT 21 07/01/2020   MONOPCT 4 07/01/2020   EOSPCT 0 07/01/2020   BASOPCT 0 07/01/2020    CMP:  Lab Results  Component Value Date   NA 143 07/01/2020   K 3.8 07/01/2020   CL 102 07/01/2020   CO2 31 07/01/2020   BUN 13 07/01/2020   CREATININE 0.52 07/01/2020   PROT 6.2 (L) 07/01/2020   ALBUMIN 2.9 (L) 07/01/2020   BILITOT 0.4 07/01/2020   ALKPHOS 89 07/01/2020   AST 23 07/01/2020   ALT 19 07/01/2020  .   Total Discharge time is about 33 minutes  Roxan Hockey M.D on 07/01/2020 at 2:03 PM  Go to [email or web address] -  for contact info  [facility] - Office  [phone]

## 2020-07-01 NOTE — TOC Transition Note (Deleted)
Transition of Care [facility]) - CM/SW Discharge Note   Patient Details  Name: Holly Phillips MRN: [id number] Date of Birth: 28-Jan-1956  Transition of Care [facility]) CM/SW Contact:  Natasha Bence, LCSW Phone Number: 07/01/2020, 2:13 PM   Clinical Narrative:    CSW received notification of patient's discharge and need for O2. CSW placed referral with Caryl Pina of [employer] for O2. Caryl Pina agreeable to provide O2 for patient. TOC signing off.    Final next level of care: Home/Self Care Barriers to Discharge: Barriers Resolved   Patient Goals and CMS Choice Patient states their goals for this hospitalization and ongoing recovery are:: Return home CMS Medicare.gov Compare Post Acute Care list provided to:: Patient Choice offered to / list presented to : Patient  Discharge Placement                    Patient and family notified of of transfer: 07/01/20  Discharge Plan and Services                DME Arranged: Oxygen DME Agency: [facility] Date DME Agency Contacted: 07/01/20 Time DME Agency Contacted: [phone] Representative spoke with at DME Agency: Caryl Pina HH Arranged: NA [place] Agency: NA        Social Determinants of Health ([place]) Interventions     Readmission Risk Interventions No flowsheet data found.

## 2020-07-01 NOTE — Discharge Instructions (Signed)
1) You are strongly advised to isolate/quarantine for at least 10 days from the date of your diagnosis with COVID-19 infection--please always wear a mask if you have to go outside the house  2)You advised to get [place] or [place]  Covid Vaccine in about  3 weeks from now to reduce your chance of getting severe Covid 19 reinfection   3)Please take medications as prescribed  4)Video/Virtual follow-up visit with primary care physician in about a week advised  5) smoking cessation strongly advised--- you may use over-the-counter nicotine patch to help you quit smoking

## 2020-07-01 NOTE — Progress Notes (Signed)
Pt ambulated in room after taking off O2 and maintained O2 of 96%. Pt kept off O2 for 46mins and O2 was 93%. Pt states no signs of shob and states her cough is chronic.

## 2020-07-03 ENCOUNTER — Telehealth: Payer: Self-pay

## 2020-07-03 LAB — CULTURE, BLOOD (ROUTINE X 2)
Culture: NO GROWTH
Culture: NO GROWTH

## 2020-07-03 NOTE — Telephone Encounter (Cosign Needed)
Transition Care Management Follow-up Telephone Call  Date of discharge and from where: [facility] 07/01/20  How have you been since you were released from the hospital? better  Any questions or concerns? No  Items Reviewed:  Did the pt receive and understand the discharge instructions provided? Yes   Medications obtained and verified? Yes   Other? No   Any new allergies since your discharge? No   Dietary orders reviewed? Yes  Do you have support at home? Yes   Home Care and Equipment/Supplies: Were home health services ordered? not applicable If so, what is the name of the agency?   Has the agency set up a time to come to the patient's home? not applicable Were any new equipment or medical supplies ordered?  No What is the name of the medical supply agency?  Were you able to get the supplies/equipment? not applicable Do you have any questions related to the use of the equipment or supplies? No  Functional Questionnaire: (I = Independent and D = Dependent) ADLs: I  Bathing/Dressing- I  Meal Prep- I  Eating- I  Maintaining continence- I  Transferring/Ambulation- I  Managing Meds- I  Follow up appointments reviewed:   PCP Hospital f/u appt confirmed? Yes  Scheduled to see Dr Morene Rankins  on 07/04/20 @ 2:30  [facility] f/u appt confirmed? No  .  Are transportation arrangements needed? No   If their condition worsens, is the pt aware to call PCP or go to the Emergency Dept.? Yes  Was the patient provided with contact information for the PCP's office or ED? Yes  Was to pt encouraged to call back with questions or concerns? Yes

## 2020-07-04 ENCOUNTER — Telehealth (INDEPENDENT_AMBULATORY_CARE_PROVIDER_SITE_OTHER): Payer: Medicare Other | Admitting: Physician Assistant

## 2020-07-04 ENCOUNTER — Encounter: Payer: Self-pay | Admitting: Physician Assistant

## 2020-07-04 VITALS — Ht 60.0 in | Wt 148.0 lb

## 2020-07-04 DIAGNOSIS — R0902 Hypoxemia: Secondary | ICD-10-CM

## 2020-07-04 DIAGNOSIS — Z720 Tobacco use: Secondary | ICD-10-CM

## 2020-07-04 DIAGNOSIS — U071 COVID-19: Secondary | ICD-10-CM | POA: Diagnosis not present

## 2020-07-04 MED ORDER — NICOTINE 21 MG/24HR TD PT24: 21.0000 mg | MEDICATED_PATCH | Freq: Every day | TRANSDERMAL | 3 refills | Status: DC

## 2020-07-04 MED ORDER — PULSE OXIMETER MISC
1.0000 | Freq: Once | 0 refills | Status: AC
Start: 2020-07-04 — End: 2020-07-04

## 2020-07-04 NOTE — Progress Notes (Signed)
Virtual Visit via Video   I connected with Holly Phillips on 07/04/20 at  2:30 PM EST by a video enabled telemedicine application and verified that I am speaking with the correct person using two identifiers. Location patient: Home Location provider: [facility], Office Persons participating in the virtual visit: Nomi Ciolek, Inda Coke PA-C, Anselmo Pickler, LPN   I discussed the limitations of evaluation and management by telemedicine and the availability of in person appointments. The patient expressed understanding and agreed to proceed.  I acted as a Education administrator for [employer], PA-C [employer], LPN   Subjective:   HPI:   Hospital f/u -- COVID-19 respiratory failure Pt is here for follow up on hospitalization from [place]. She was admitted to [facility] on 2/10 -- 07/01/2020. She tested positive for COVID on 06/28/20, she is currently on day 6 of her 10 day quarantine. She required up to 4 L of O2 but weaned off prior to d/c and is currently without oxygen. She does not have a pulse oximeter. She was treated with IV solu-medrol and remdesivir. She is currently on day 3/5 of a 50 mg prednisone script from the pharmacy. CXR without PNA.  She is still having a cough, non-productive. Using Robitussin DM with relief. Continues to have some SOB with exertion. Has been using symbicort or dulera, she cannot tell me which. She could not afford the combivent inhaler. She is also taking zinc 220 mg daily and vitamin C 500 mg daily.  She is still vaping. She would like to restart patches. She is considering bupropion for smoking cessation.    Denies any worsening symptoms, chest pain, LE swelling.  ROS: See pertinent positives and negatives per HPI.  Patient Active Problem List   Diagnosis Date Noted  . COVID-19 virus infection 06/28/2020  . Acute respiratory failure with hypoxia ([place]) 06/28/2020  . Hypokalemia 06/28/2020  . Lactic acidosis 06/28/2020  . Mixed  conductive and sensorineural hearing loss of both ears 08/25/2019  . Dysphagia 08/25/2019  . Tobacco abuse 08/01/2019  . COPD (chronic obstructive pulmonary disease) ([place]) 08/01/2019  . Arthritis 08/01/2019  . History of lumbar fusion 08/01/2019  . Recurrent depression ([place]) 08/01/2019  . Anxiety 08/01/2019  . Hyperlipidemia 08/01/2019  . Constipation 08/01/2019  . Gastroesophageal reflux disease 08/01/2019  . Family history of heart disease 08/01/2019    Social History   Tobacco Use  . Smoking status: Current Every Day Smoker    Packs/day: 1.00    Types: Cigarettes  . Smokeless tobacco: Never Used  Substance Use Topics  . Alcohol use: Not Currently    Current Outpatient Medications:  .  acetaminophen (TYLENOL) 325 MG tablet, Take 2 tablets (650 mg total) by mouth every 6 (six) hours as needed for mild pain or headache (fever >/= 101)., Disp: 12 tablet, Rfl: 0 .  albuterol (VENTOLIN HFA) 108 (90 Base) MCG/ACT inhaler, INHALE 1 TO 2 PUFFS INTO THE LUNGS EVERY 6 HOURS AS NEEDED FOR WHEEZING OR SHORTNESS OF BREATH, Disp: 18 g, Rfl: 5 .  ARIPiprazole (ABILIFY) 5 MG tablet, Take 5 mg by mouth at bedtime., Disp: , Rfl:  .  ascorbic acid (VITAMIN C) 500 MG tablet, Take 1 tablet (500 mg total) by mouth daily., Disp: 30 tablet, Rfl: 2 .  atorvastatin (LIPITOR) 10 MG tablet, TAKE 1 TABLET(10 MG) BY MOUTH DAILY, Disp: 90 tablet, Rfl: 1 .  budesonide-formoterol (SYMBICORT) 160-4.5 MCG/ACT inhaler, INHALE 2 PUFFS INTO THE LUNGS TWICE DAILY, Disp: 10.2 g, Rfl:  3 .  celecoxib (CELEBREX) 100 MG capsule, TAKE 1 CAPSULE(100 MG) BY MOUTH DAILY, Disp: 90 capsule, Rfl: 0 .  cyclobenzaprine (FLEXERIL) 10 MG tablet, Take 1 tablet (10 mg total) by mouth 3 (three) times daily., Disp: 90 tablet, Rfl: 1 .  escitalopram (LEXAPRO) 20 MG tablet, Take 20 mg by mouth daily., Disp: , Rfl:  .  gabapentin (NEURONTIN) 800 MG tablet, TAKE 1 TABLET(800 MG) BY MOUTH THREE TIMES DAILY, Disp: 270 tablet, Rfl: 1 .   guaiFENesin-dextromethorphan (ROBITUSSIN DM) 100-10 MG/5ML syrup, Take 10 mLs by mouth every 4 (four) hours as needed for cough., Disp: 118 mL, Rfl: 0 .  hydrOXYzine (VISTARIL) 50 MG capsule, Take 50 mg by mouth 4 (four) times daily., Disp: , Rfl:  .  lidocaine (LIDODERM) 5 %, Place 3 patches onto the skin daily as needed (pain)., Disp: , Rfl:  .  LINZESS 290 MCG CAPS capsule, TAKE 1 CAPSULE(290 MCG) BY MOUTH DAILY BEFORE BREAKFAST, Disp: 30 capsule, Rfl: 1 .  mirtazapine (REMERON) 30 MG tablet, Take 30 mg by mouth at bedtime., Disp: , Rfl:  .  Misc. Devices (PULSE OXIMETER) MISC, 1 each by Does not apply route once for 1 dose., Disp: 1 each, Rfl: 0 .  mometasone-formoterol (DULERA) 200-5 MCG/ACT AERO, Inhale 2 puffs into the lungs 2 (two) times daily., Disp: , Rfl:  .  montelukast (SINGULAIR) 10 MG tablet, TAKE 1 TABLET(10 MG) BY MOUTH DAILY (Patient taking differently: Take 10 mg by mouth at bedtime.), Disp: 90 tablet, Rfl: 1 .  omeprazole (PRILOSEC) 40 MG capsule, Take 1 capsule (40 mg total) by mouth 2 (two) times daily., Disp: 90 capsule, Rfl: 1 .  oxyCODONE-acetaminophen (PERCOCET) 10-325 MG tablet, Take 1 tablet by mouth 4 (four) times daily as needed., Disp: , Rfl:  .  predniSONE (DELTASONE) 50 MG tablet, Take 1 tablet (50 mg total) by mouth daily with breakfast for 5 days., Disp: 5 tablet, Rfl: 0 .  tiZANidine (ZANAFLEX) 4 MG tablet, Take by mouth., Disp: , Rfl:  .  traZODone (DESYREL) 150 MG tablet, Take 150 mg by mouth at bedtime., Disp: , Rfl:  .  zinc sulfate 220 (50 Zn) MG capsule, Take 1 capsule (220 mg total) by mouth daily., Disp: 30 capsule, Rfl: 1 .  nicotine (NICODERM CQ - DOSED IN MG/24 HOURS) 21 mg/24hr patch, Place 1 patch (21 mg total) onto the skin daily., Disp: 28 patch, Rfl: 3  Allergies  Allergen Reactions  . Mucinex [Guaifenesin Er] Other (See Comments)    Jerky movements    Objective:   VITALS: Per patient if applicable, see vitals. GENERAL: Alert, appears well  and in no acute distress. HEENT: Atraumatic, conjunctiva clear, no obvious abnormalities on inspection of external nose and ears. NECK: Normal movements of the head and neck. CARDIOPULMONARY: No increased WOB. Speaking in clear sentences. I:E ratio WNL.  MS: Moves all visible extremities without noticeable abnormality. PSYCH: Pleasant and cooperative, well-groomed. Speech normal rate and rhythm. Affect is appropriate. Insight and judgement are appropriate. Attention is focused, linear, and appropriate.  NEURO: CN grossly intact. Oriented as arrived to appointment on time with no prompting. Moves both UE equally.  SKIN: No obvious lesions, wounds, erythema, or cyanosis noted on face or hands.  Assessment and Plan:   [place] was seen today for hospitalization follow-up.  Diagnoses and all orders for this visit:  [id number]; Hypoxia She appears in NAD during our virtual visit without respiratory distress. Discussed need for pulse ox to monitor levels. She  is agreeable, I have ordered one to see if her insurance will pay for this. Recommended that she use her prescribed inhalers regularly. She is going to come for car visit on Friday for [place] to evaluate her vitals as her appointment was late this afternoon and I am out of office tomorrow. Recommend that she go to ER immediately in interim if any concerning symptoms. Has follow-up appt with pulm at end of March. Encouraged vaccination after 3 weeks but she has refused.  Tobacco abuse Encouraged reduction and cessation. Patches sent. She is going to see if she can start bupropion when speaking with her psych provider who currently prescribes her other medications.  Other orders -     Misc. Devices (PULSE OXIMETER) MISC; 1 each by Does not apply route once for 1 dose. -     nicotine (NICODERM CQ - DOSED IN MG/24 HOURS) 21 mg/24hr patch; Place 1 patch (21 mg total) onto the skin daily.  I discussed the assessment and treatment plan with the  patient. The patient was provided an opportunity to ask questions and all were answered. The patient agreed with the plan and demonstrated an understanding of the instructions.   The patient was advised to call back or seek an in-person evaluation if the symptoms worsen or if the condition fails to improve as anticipated.   CMA or LPN served as scribe during this visit. History, Physical, and Plan performed by medical provider. The above documentation has been reviewed and is accurate and complete.  Time spent with patient today was 55 minutes which consisted of chart review, discussing diagnosis, work up, treatment answering questions and documentation.   [place], [place] 07/04/2020

## 2020-07-06 ENCOUNTER — Ambulatory Visit (INDEPENDENT_AMBULATORY_CARE_PROVIDER_SITE_OTHER): Payer: Medicare Other | Admitting: Physician Assistant

## 2020-07-06 DIAGNOSIS — U071 COVID-19: Secondary | ICD-10-CM

## 2020-07-06 DIAGNOSIS — R0902 Hypoxemia: Secondary | ICD-10-CM

## 2020-07-06 NOTE — Progress Notes (Signed)
Patient did car visit this morning to follow-up from her hospitalization follow-up with me on 07/04/20.  Oxygen levels are currently reading 82-88%.  She is experiencing worsening SOB since we talked. She is on her last day of prednisone. I am concerned that she could further decompensate over the weekend and may need home oxygen.  Will refer patient to [facility] for further evaluation.  Inda Coke PA-C

## 2020-07-06 NOTE — Telephone Encounter (Signed)
Please call patient and tell her that is recommended that she go to [facility], or hospital of her choice as soon as possible for further evaluation. She is not "out of the woods" yet from her COVID-19 infection and needs to be re-evaluate due to decreased oxygen levels and issues with ongoing SOB.  Inda Coke PA-C

## 2020-07-06 NOTE — Telephone Encounter (Signed)
Spoke to pt told her Holly Phillips discussed her issue with a provider  recommended that you go to [facility], or hospital of her choice as soon as possible for further evaluation. She is not "out of the woods" yet from her COVID-19 infection and needs to be re-evaluate due to decreased oxygen levels and issues with ongoing SOB. Pt verbalized understanding and will go.

## 2020-07-12 ENCOUNTER — Telehealth: Payer: Self-pay

## 2020-07-12 NOTE — Telephone Encounter (Signed)
Left detailed message for D'onna on personal voicemail. I have received the oxygen request forms, the provider is out of the office today. Will have her fill out and fax back tomorrow. Any questions please call office.

## 2020-07-12 NOTE — Telephone Encounter (Signed)
Holly Phillips is calling from an [employer] oxygen requesting an update on forms she faxed on 07/10/20, is requesting a call back.

## 2020-07-13 ENCOUNTER — Emergency Department (HOSPITAL_COMMUNITY)
Admission: EM | Admit: 2020-07-13 | Discharge: 2020-07-13 | Disposition: A | Discharge status: Home / Self Care | Payer: Medicare Other | Attending: Emergency Medicine | Admitting: Emergency Medicine

## 2020-07-13 ENCOUNTER — Encounter (HOSPITAL_COMMUNITY): Payer: Self-pay | Admitting: *Deleted

## 2020-07-13 ENCOUNTER — Emergency Department (HOSPITAL_COMMUNITY): Payer: Medicare Other

## 2020-07-13 ENCOUNTER — Other Ambulatory Visit: Payer: Self-pay

## 2020-07-13 DIAGNOSIS — J441 Chronic obstructive pulmonary disease with (acute) exacerbation: Secondary | ICD-10-CM | POA: Diagnosis not present

## 2020-07-13 DIAGNOSIS — F17210 Nicotine dependence, cigarettes, uncomplicated: Secondary | ICD-10-CM | POA: Insufficient documentation

## 2020-07-13 DIAGNOSIS — Z8616 Personal history of COVID-19: Secondary | ICD-10-CM | POA: Diagnosis not present

## 2020-07-13 DIAGNOSIS — J181 Lobar pneumonia, unspecified organism: Secondary | ICD-10-CM | POA: Insufficient documentation

## 2020-07-13 DIAGNOSIS — J45909 Unspecified asthma, uncomplicated: Secondary | ICD-10-CM | POA: Diagnosis not present

## 2020-07-13 DIAGNOSIS — J189 Pneumonia, unspecified organism: Secondary | ICD-10-CM

## 2020-07-13 DIAGNOSIS — R0602 Shortness of breath: Secondary | ICD-10-CM | POA: Diagnosis present

## 2020-07-13 LAB — CBC WITH DIFFERENTIAL/PLATELET
Abs Immature Granulocytes: 0.05 10*3/uL (ref 0.00–0.07)
Basophils Absolute: 0 10*3/uL (ref 0.0–0.1)
Basophils Relative: 0 %
Eosinophils Absolute: 0.2 10*3/uL (ref 0.0–0.5)
Eosinophils Relative: 2 %
HCT: 40.1 % (ref 36.0–46.0)
Hemoglobin: 12 g/dL (ref 12.0–15.0)
Immature Granulocytes: 1 %
Lymphocytes Relative: 36 %
Lymphs Abs: 3.5 10*3/uL (ref 0.7–4.0)
MCH: 29.9 pg (ref 26.0–34.0)
MCHC: 29.9 g/dL — ABNORMAL LOW (ref 30.0–36.0)
MCV: 100 fL (ref 80.0–100.0)
Monocytes Absolute: 0.8 10*3/uL (ref 0.1–1.0)
Monocytes Relative: 8 %
Neutro Abs: 5.2 10*3/uL (ref 1.7–7.7)
Neutrophils Relative %: 53 %
Platelets: 292 10*3/uL (ref 150–400)
RBC: 4.01 MIL/uL (ref 3.87–5.11)
RDW: 14.1 % (ref 11.5–15.5)
WBC: 9.8 10*3/uL (ref 4.0–10.5)
nRBC: 0 % (ref 0.0–0.2)

## 2020-07-13 LAB — COMPREHENSIVE METABOLIC PANEL
ALT: 18 U/L (ref 0–44)
AST: 25 U/L (ref 15–41)
Albumin: 3.4 g/dL — ABNORMAL LOW (ref 3.5–5.0)
Alkaline Phosphatase: 83 U/L (ref 38–126)
Anion gap: 8 (ref 5–15)
BUN: 11 mg/dL (ref 8–23)
CO2: 28 mmol/L (ref 22–32)
Calcium: 8.4 mg/dL — ABNORMAL LOW (ref 8.9–10.3)
Chloride: 100 mmol/L (ref 98–111)
Creatinine, Ser: 0.77 mg/dL (ref 0.44–1.00)
GFR, Estimated: >60 mL/min (ref >60)
Glucose, Bld: 93 mg/dL (ref 70–99)
Potassium: 4.1 mmol/L (ref 3.5–5.1)
Sodium: 136 mmol/L (ref 135–145)
Total Bilirubin: 0.3 mg/dL (ref 0.3–1.2)
Total Protein: 6.6 g/dL (ref 6.5–8.1)

## 2020-07-13 MED ORDER — DOXYCYCLINE HYCLATE 100 MG PO TABS: 100.0000 mg | ORAL_TABLET | Freq: Two times a day (BID) | ORAL | 0 refills | Status: DC

## 2020-07-13 MED ORDER — PREDNISONE 50 MG PO TABS
60.0000 mg | ORAL_TABLET | Freq: Once | ORAL | Status: AC
  Administered 2020-07-13: 60 mg via ORAL
  Filled 2020-07-13: qty 1

## 2020-07-13 MED ORDER — DOXYCYCLINE HYCLATE 100 MG PO TABS
100.0000 mg | ORAL_TABLET | Freq: Once | ORAL | Status: AC
  Administered 2020-07-13: 100 mg via ORAL
  Filled 2020-07-13: qty 1

## 2020-07-13 MED ORDER — PREDNISONE 50 MG PO TABS: ORAL_TABLET | ORAL | 0 refills | Status: DC

## 2020-07-13 NOTE — Telephone Encounter (Signed)
Please see message. °

## 2020-07-13 NOTE — ED Triage Notes (Signed)
States she was diagnosed with covid on 06/28/20. States she has COPD and was advised to come in for oxygen check.

## 2020-07-13 NOTE — Telephone Encounter (Signed)
Called pt back told her per Aldona Bar, After review of chart and discussion with Dr. Jerline Pain....  The best course of action is to go to the ER. There are significant health conditions that could be contributing to her decreased oxygen levels -- she could have fluid on her lungs, a blood clot, among other potentially fatal issues. When she was discharged from the hospital she was deemed to not need oxygen and now that she has concerns for need for oxygen, this requires urgent evaluation to figure what is going on. I strongly recommend that she go to the ER. If her oxygen levels remain persistently low, she could have significant problems from this. Pt verbalized understanding and said she will go to the ER.

## 2020-07-13 NOTE — Telephone Encounter (Signed)
Spoke to pt told her received a form for oxygen from [employer]. Asked pt what ED she went to last week? Pt said she did not go but has been approved for oxygen. Asked her how she was approved? Pt said they are going off of the hospitalization info. Told her okay, asked her if she got a pulse ox? Pt said yes, she has been SOB with exertion, O2 sats running 84-89 when she is up and about. Told her okay I will let Aldona Bar know and she will fill out paperwork and fax over to [place] and they will be in touch with you.Pt verbalized understanding.

## 2020-07-13 NOTE — Discharge Instructions (Signed)
See your Physician for recheck next week  °

## 2020-07-13 NOTE — Telephone Encounter (Signed)
After review of chart and discussion with Dr. Jerline Pain....  The best course of action is to go to the ER. There are significant health conditions that could be contributing to her decreased oxygen levels -- she could have fluid on her lungs, a blood clot, among other potentially fatal issues. When she was discharged from the hospital she was deemed to not need oxygen and now that she has concerns for need for oxygen, this requires urgent evaluation to figure what is going on.  I strongly recommend that she go to the ER. If her oxygen levels remain persistently low, she could have significant problems from this.  Aldona Bar

## 2020-07-13 NOTE — ED Provider Notes (Signed)
[facility] EMERGENCY DEPARTMENT Provider Note   CSN: [id number] Arrival date & time: 07/13/20  1059     History Chief Complaint  Patient presents with  . Shortness of Breath    Holly Phillips is a 66 y.o. female.  The history is provided by the patient. No language interpreter was used.  Shortness of Breath Severity:  Moderate Onset quality:  Gradual Timing:  Constant Progression:  Worsening Chronicity:  New Relieved by:  Nothing Worsened by:  Nothing Ineffective treatments:  None tried Associated symptoms: no cough and no fever    Pt reports she was diagnosed on 2/10  With covid.  Pt reports she is here because she needs home oxygen.  Pt reports she is approved for home oxygen but is having trouble getting it     Past Medical History:  Diagnosis Date  . Anxiety   . Arthritis   . Asthma   . Chronic back pain   . Colon polyps   . COPD (chronic obstructive pulmonary disease) ([place])   . Depression   . Emphysema of lung [facility])     Patient Active Problem List   Diagnosis Date Noted  . COVID-19 virus infection 06/28/2020  . Acute respiratory failure with hypoxia ([place]) 06/28/2020  . Hypokalemia 06/28/2020  . Lactic acidosis 06/28/2020  . Mixed conductive and sensorineural hearing loss of both ears 08/25/2019  . Dysphagia 08/25/2019  . Tobacco abuse 08/01/2019  . COPD (chronic obstructive pulmonary disease) ([place]) 08/01/2019  . Arthritis 08/01/2019  . History of lumbar fusion 08/01/2019  . Recurrent depression ([place]) 08/01/2019  . Anxiety 08/01/2019  . Hyperlipidemia 08/01/2019  . Constipation 08/01/2019  . Gastroesophageal reflux disease 08/01/2019  . Family history of heart disease 08/01/2019    Past Surgical History:  Procedure Laterality Date  . ABDOMINAL HYSTERECTOMY    . CERVICAL SPINE SURGERY    . INNER EAR SURGERY    . LUMBAR FUSION  2012   L3-L7     OB History   No obstetric history on file.     Family History  Problem Relation Age of  Onset  . Depression Mother   . Diabetes Mother   . Hypertension Mother   . Hyperlipidemia Mother   . Heart attack Mother   . Osteoarthritis Father   . Asthma Father   . COPD Father   . Breast cancer Sister   . Heart attack Maternal Grandmother   . Lupus Sister   . Osteoarthritis Sister   . Asthma Sister   . COPD Sister   . Diabetes Sister   . Drug abuse Sister   . Heart attack Sister   . Colon cancer Neg Hx   . Esophageal cancer Neg Hx     Social History   Tobacco Use  . Smoking status: Current Every Day Smoker    Packs/day: 1.00    Types: Cigarettes  . Smokeless tobacco: Never Used  Vaping Use  . Vaping Use: Every day  Substance Use Topics  . Alcohol use: Not Currently  . Drug use: Not Currently    Types: Other-see comments    Comment: CBD and medical marijuana    Home Medications Prior to Admission medications   Medication Sig Start Date End Date Taking? Authorizing Provider  acetaminophen (TYLENOL) 325 MG tablet Take 2 tablets (650 mg total) by mouth every 6 (six) hours as needed for mild pain or headache (fever >/= 101). 07/01/20   Roxan Hockey, MD  albuterol (VENTOLIN HFA) 108 (90 Base)  MCG/ACT inhaler INHALE 1 TO 2 PUFFS INTO THE LUNGS EVERY 6 HOURS AS NEEDED FOR WHEEZING OR SHORTNESS OF BREATH 06/11/20   Inda Coke, PA  ARIPiprazole (ABILIFY) 5 MG tablet Take 5 mg by mouth at bedtime. 06/14/20   [provider]  ascorbic acid (VITAMIN C) 500 MG tablet Take 1 tablet (500 mg total) by mouth daily. 07/02/20   Roxan Hockey, MD  atorvastatin (LIPITOR) 10 MG tablet TAKE 1 TABLET(10 MG) BY MOUTH DAILY 01/24/20   Inda Coke, PA  budesonide-formoterol [facility]) 160-4.5 MCG/ACT inhaler INHALE 2 PUFFS INTO THE LUNGS TWICE DAILY 05/14/20   Chesley Mires, MD  celecoxib (CELEBREX) 100 MG capsule TAKE 1 CAPSULE(100 MG) BY MOUTH DAILY 04/20/20   Inda Coke, PA  cyclobenzaprine (FLEXERIL) 10 MG tablet Take 1 tablet (10 mg total) by mouth 3 (three)  times daily. 08/01/19   Inda Coke, PA  escitalopram (LEXAPRO) 20 MG tablet Take 20 mg by mouth daily.    [provider]  gabapentin (NEURONTIN) 800 MG tablet TAKE 1 TABLET(800 MG) BY MOUTH THREE TIMES DAILY 01/25/20   Inda Coke, PA  guaiFENesin-dextromethorphan (ROBITUSSIN DM) 100-10 MG/5ML syrup Take 10 mLs by mouth every 4 (four) hours as needed for cough. 07/01/20   Roxan Hockey, MD  hydrOXYzine (VISTARIL) 50 MG capsule Take 50 mg by mouth 4 (four) times daily. 06/14/20   [provider]  lidocaine (LIDODERM) 5 % Place 3 patches onto the skin daily as needed (pain). 01/16/20   [provider]  LINZESS 290 MCG CAPS capsule TAKE 1 CAPSULE(290 MCG) BY MOUTH DAILY BEFORE BREAKFAST 02/01/20   Inda Coke, PA  mirtazapine (REMERON) 30 MG tablet Take 30 mg by mouth at bedtime. 01/24/20   [provider]  mometasone-formoterol (DULERA) 200-5 MCG/ACT AERO Inhale 2 puffs into the lungs 2 (two) times daily.    [provider]  montelukast (SINGULAIR) 10 MG tablet TAKE 1 TABLET(10 MG) BY MOUTH DAILY Patient taking differently: Take 10 mg by mouth at bedtime. 01/24/20   Inda Coke, PA  nicotine (NICODERM CQ - DOSED IN MG/24 HOURS) 21 mg/24hr patch Place 1 patch (21 mg total) onto the skin daily. 07/04/20   Inda Coke, PA  omeprazole (PRILOSEC) 40 MG capsule Take 1 capsule (40 mg total) by mouth 2 (two) times daily. 08/01/19   Inda Coke, PA  oxyCODONE-acetaminophen (PERCOCET) 10-325 MG tablet Take 1 tablet by mouth 4 (four) times daily as needed. 06/08/19   [provider]  tiZANidine (ZANAFLEX) 4 MG tablet Take by mouth. 06/30/20   [provider]  traZODone (DESYREL) 150 MG tablet Take 150 mg by mouth at bedtime. 08/26/19   [provider]  zinc sulfate 220 (50 Zn) MG capsule Take 1 capsule (220 mg total) by mouth daily. 07/02/20   Roxan Hockey, MD    Allergies    Mucinex [guaifenesin er]  Review of  Systems   Review of Systems  Constitutional: Negative for fever.  Respiratory: Positive for shortness of breath. Negative for cough.   All other systems reviewed and are negative.   Physical Exam Updated Vital Signs BP 102/74 (BP Location: Right Arm)   Pulse 94   Temp 97.8 F (36.6 C) (Oral)   Resp 18   Ht 5' (1.524 m)   Wt 63.5 kg   LMP  (LMP Unknown)   SpO2 (!) 88%   BMI 27.34 kg/m   Physical Exam Vitals and nursing note reviewed.  Constitutional:      Appearance: She  is well-developed and well-nourished.  HENT:     Head: Normocephalic.  Eyes:     Extraocular Movements: EOM normal.  Pulmonary:     Effort: Pulmonary effort is normal.     Breath sounds: Examination of the right-upper field reveals rhonchi. Examination of the left-upper field reveals rhonchi. Examination of the right-middle field reveals rhonchi. Examination of the left-middle field reveals rhonchi. Examination of the right-lower field reveals rhonchi. Examination of the left-lower field reveals rhonchi. Decreased breath sounds and rhonchi present.  Abdominal:     General: There is no distension.  Musculoskeletal:        General: Normal range of motion.     Cervical back: Normal range of motion.  Skin:    General: Skin is warm.  Neurological:     General: No focal deficit present.     Mental Status: She is alert and oriented to person, place, and time.  Psychiatric:        Mood and Affect: Mood and affect and mood normal.     ED Results / Procedures / Treatments   Labs (all labs ordered are listed, but only abnormal results are displayed) Labs Reviewed - No data to display  EKG None  Radiology No results found.  Procedures Procedures   Medications Ordered in ED Medications - No data to display  ED Course  I have reviewed the triage vital signs and the nursing notes.  Pertinent labs & imaging results that were available during my care of the patient were reviewed by me and considered  in my medical decision making (see chart for details).    MDM Rules/Calculators/A&P                          MDM:  02 sat ranges 88%-94%.  Pt reports her oxygen level decreases with exertion.  Chest xray shows a possible early pneumonia.  I will treat pt with Levaquin and prednisone.  Final Clinical Impression(s) / ED Diagnoses Final diagnoses:  COPD exacerbation ([place])  Community acquired pneumonia of right upper lobe of lung    Rx / DC Orders ED Discharge Orders         Ordered    doxycycline (VIBRA-TABS) 100 MG tablet  2 times daily        07/13/20 1723    predniSONE (DELTASONE) 50 MG tablet        07/13/20 1723        An After Visit Summary was printed and given to the patient.    Fransico Meadow, [place] 07/13/20 Maryagnes Amos, MD 07/16/20 [phone]

## 2020-07-16 ENCOUNTER — Other Ambulatory Visit: Payer: Self-pay | Admitting: Physician Assistant

## 2020-07-16 ENCOUNTER — Telehealth: Payer: Self-pay

## 2020-07-16 DIAGNOSIS — R0782 Intercostal pain: Secondary | ICD-10-CM

## 2020-07-16 NOTE — Telephone Encounter (Signed)
Patient is calling in stating she went to the ED, and wanted to know if she is going to qualify for an oxygen tank.

## 2020-07-16 NOTE — Telephone Encounter (Signed)
Please see message and advise 

## 2020-07-16 NOTE — Telephone Encounter (Signed)
Patient is returning a call from [place],

## 2020-07-16 NOTE — Telephone Encounter (Signed)
Pt is agreeable to having CT scan done.

## 2020-07-16 NOTE — Telephone Encounter (Signed)
I would like to get a CT scan of her lungs to see if there are any blood clots first. If this is negative, then we can send in oxygen.

## 2020-07-16 NOTE — Telephone Encounter (Signed)
Ordered

## 2020-07-16 NOTE — Telephone Encounter (Signed)
Left message on voicemail to call office.  

## 2020-07-16 NOTE — Telephone Encounter (Signed)
Spoke to pt told her Holly Phillips would like to get a CT scan of her lungs to see if there are any blood clots first. If this is negative, then we can send in oxygen. Pt verbalized understanding and was agreeable. Told her okay someone will be in touch about scheduling.

## 2020-07-18 ENCOUNTER — Ambulatory Visit (HOSPITAL_COMMUNITY)
Admission: RE | Admit: 2020-07-18 | Discharge: 2020-07-18 | Disposition: A | Discharge status: Home / Self Care | Payer: Medicare Other | Source: Ambulatory Visit | Attending: Physician Assistant | Admitting: Physician Assistant

## 2020-07-18 ENCOUNTER — Telehealth: Payer: Self-pay | Admitting: *Deleted

## 2020-07-18 ENCOUNTER — Other Ambulatory Visit: Payer: Self-pay

## 2020-07-18 DIAGNOSIS — R0782 Intercostal pain: Secondary | ICD-10-CM | POA: Insufficient documentation

## 2020-07-18 IMAGING — CT CT ANGIO CHEST
2 of 6 series · 18 of 46 positions shown · IV contrast (Omnipaque or Isovue)
Comparison: CT chest [DATE]

CLINICAL DATA: High suspicion pulmonary embolism. Smoker with COPD
and short of breath.

EXAM:
CT ANGIOGRAPHY CHEST WITH CONTRAST
TECHNIQUE: Multidetector CT imaging of the chest was performed using the
standard protocol during bolus administration of intravenous
contrast. Multiplanar CT image reconstructions and MIPs were
obtained to evaluate the vascular anatomy.
CONTRAST:  100mL OMNIPAQUE IOHEXOL 350 MG/ML SOLN

[Series 5: pe axial thins · axial · 0.63mm/px · z∈[+1088,+1358]mm · 15 of 296 slices shown]
[im 13/296  lung]
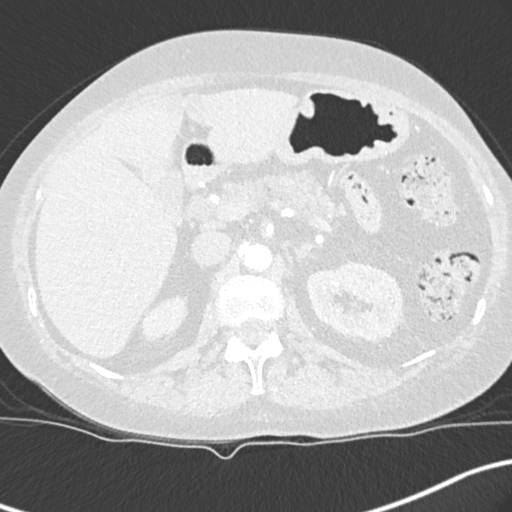
[im 39/296  soft-tissue]
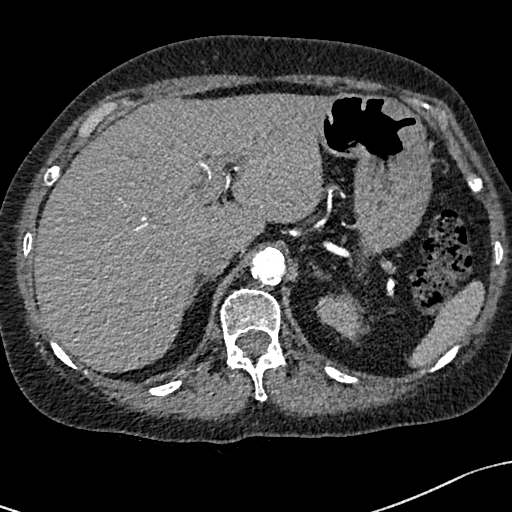
[im 52/296  lung]
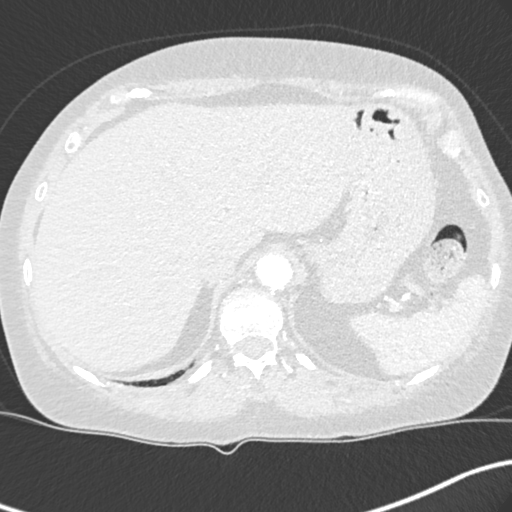
[im 77/296  soft-tissue]
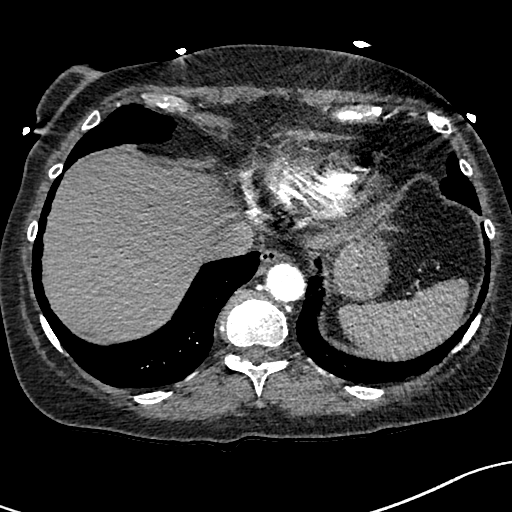
[im 90/296  lung]
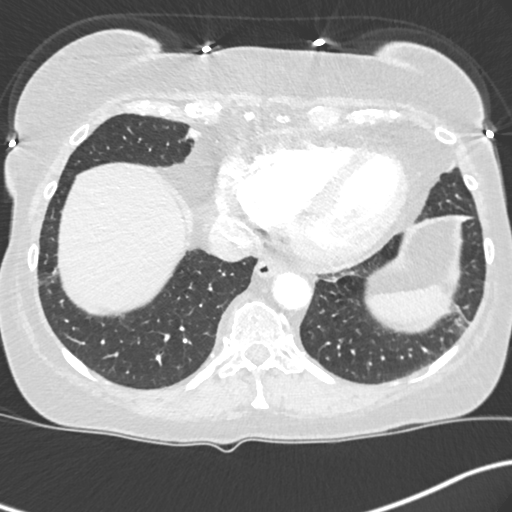
[im 116/296  soft-tissue]
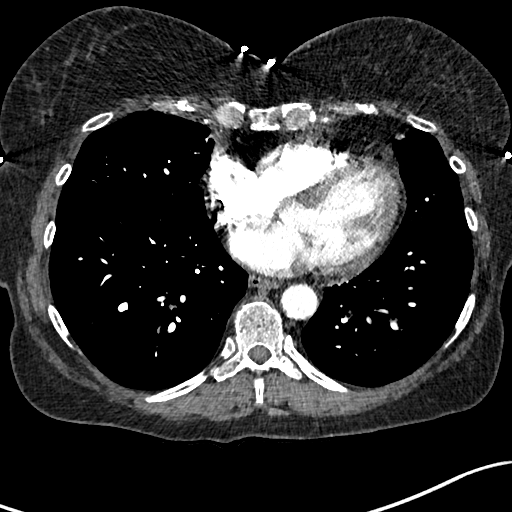
[im 129/296  lung]
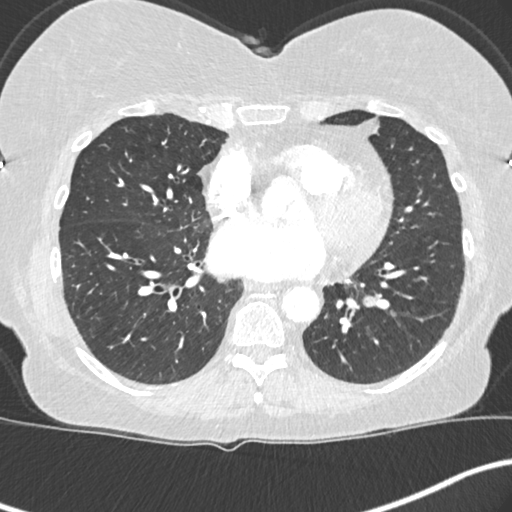
[im 154/296  soft-tissue]
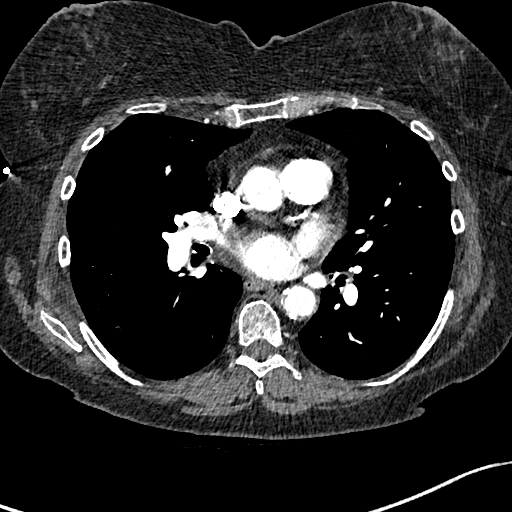
[im 167/296  lung]
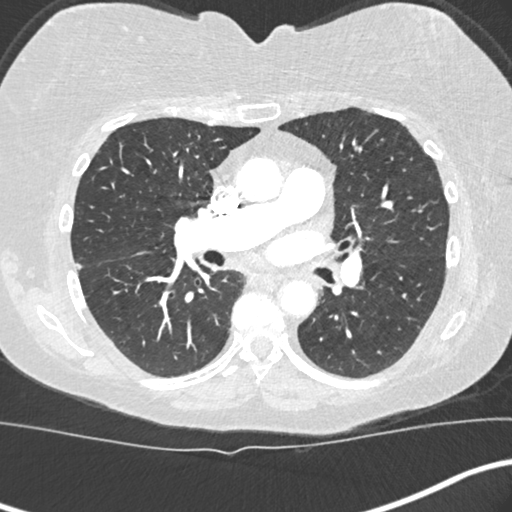
[im 180/296  soft-tissue]
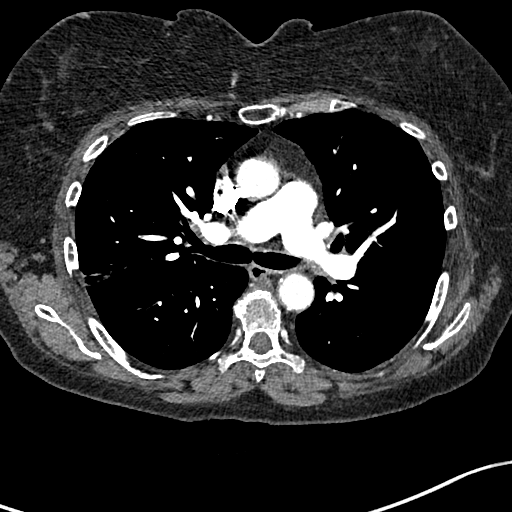
[im 206/296  lung]
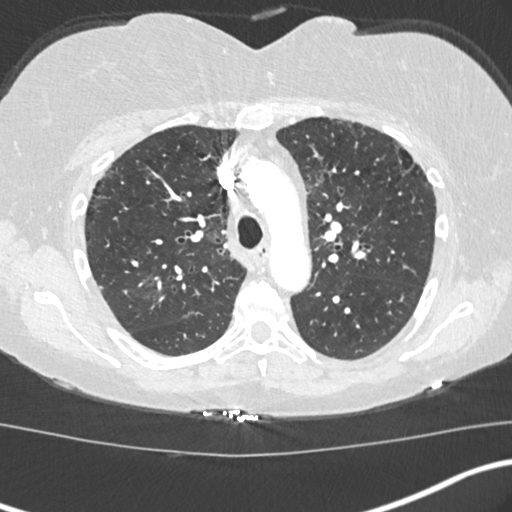
[im 219/296  soft-tissue]
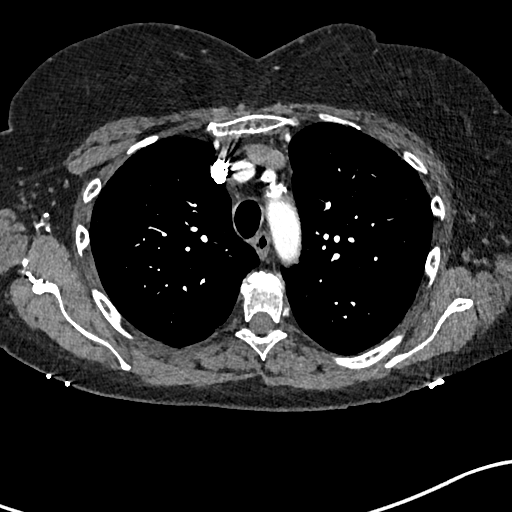
[im 244/296  lung]
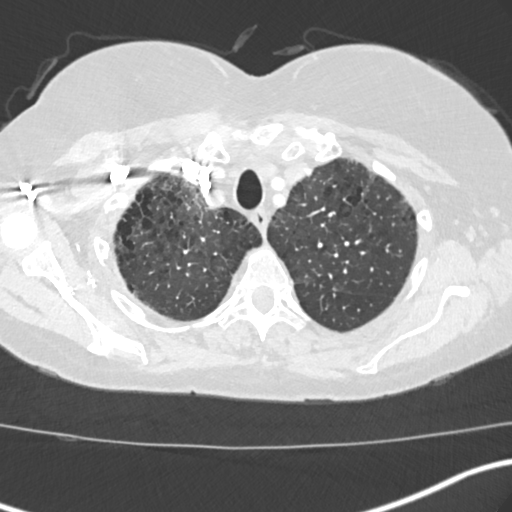
[im 257/296  soft-tissue]
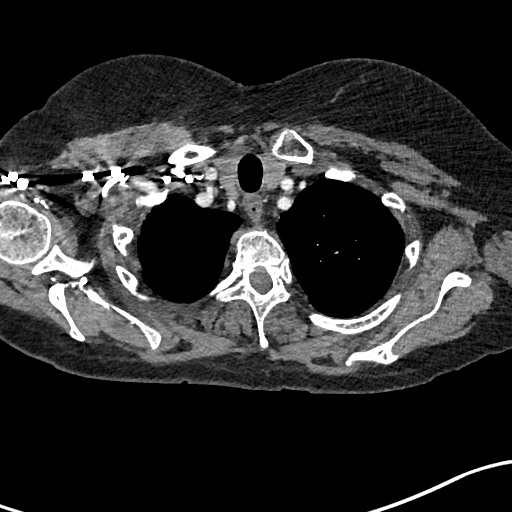
[im 283/296  lung]
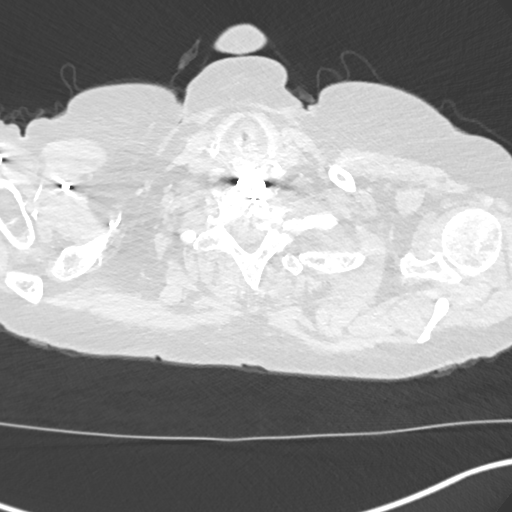

[Series 7: cor soft · coronal · 0.60mm/px · 3 of 151 slices shown]
[im 38/151  soft-tissue]
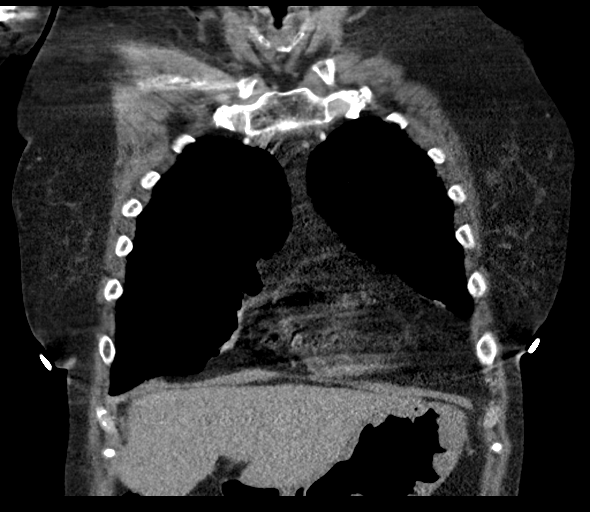
[im 76/151  soft-tissue]
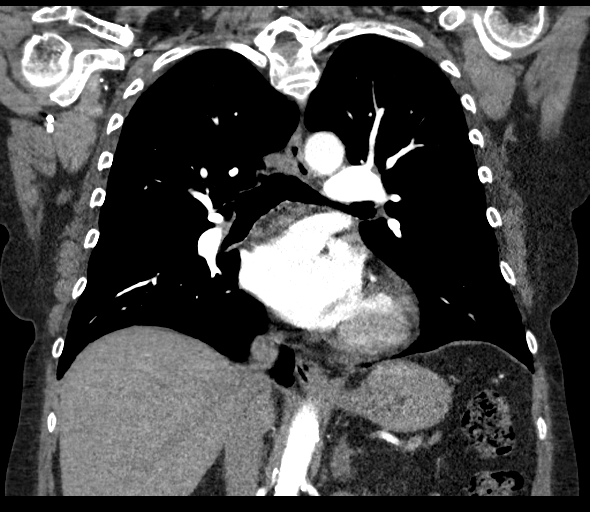
[im 113/151  soft-tissue]
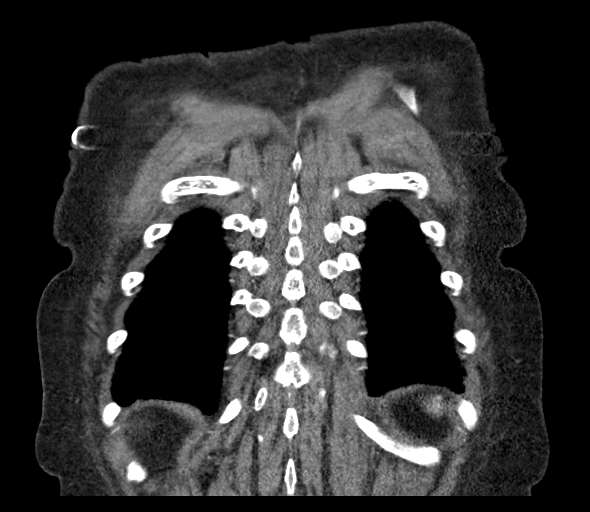

[18 of 46 positions shown; findings below may reference images not displayed]

FINDINGS: Cardiovascular: Satisfactory opacification of the pulmonary arteries
to the segmental level. No evidence of pulmonary embolism. Normal
heart size. No pericardial effusion. Atherosclerotic calcification
aortic arch and proximal great vessels.

Mediastinum/Nodes: Negative for mass or adenopathy. Negative
esophagus.

Lungs/Pleura: Mild apical emphysema with subpleural scarring
bilaterally. Mild airspace disease right upper lobe just above the
major fissure, not present on the prior CT. Possible pneumonia as
noted on recent chest x-ray of [DATE]. No other infiltrate or
effusion. Negative for mass lesion. Mild scarring in the left lung
base.

Upper Abdomen: Negative

Musculoskeletal: ACDF cervical fusion with anterolisthesis 3 mm
C7-T1. No acute skeletal abnormality.

Review of the MIP images confirms the above findings.
IMPRESSION: 1. Negative for pulmonary embolism
2. Mild right upper lobe infiltrate, possible pneumonia.
3. Aortic Atherosclerosis ([7J]-[7J]) and Emphysema ([7J]-[7J]).

## 2020-07-18 MED ORDER — IOHEXOL 350 MG/ML SOLN
100.0000 mL | Freq: Once | INTRAVENOUS | Status: AC | PRN
  Administered 2020-07-18: 100 mL via INTRAVENOUS

## 2020-07-18 NOTE — Telephone Encounter (Signed)
Called [facility] at [phone] left message on voicemail for D'onna told her calling to get order faxed again for oxygen for pt.

## 2020-07-18 NOTE — Telephone Encounter (Signed)
Received fax from [facility] for oxygen orders. Given to [place].

## 2020-07-18 NOTE — Telephone Encounter (Signed)
Holly Phillips is calling in stating she never received a call but is going to fax the forms over again. Another good number is [phone]

## 2020-07-18 NOTE — Telephone Encounter (Signed)
Orders for Oxygen signed, printed ED note both faxed to [phone].

## 2020-07-30 ENCOUNTER — Other Ambulatory Visit: Payer: Self-pay | Admitting: Physician Assistant

## 2020-08-13 ENCOUNTER — Ambulatory Visit (INDEPENDENT_AMBULATORY_CARE_PROVIDER_SITE_OTHER): Payer: Medicare Other | Admitting: Pulmonary Disease

## 2020-08-13 ENCOUNTER — Other Ambulatory Visit: Payer: Self-pay

## 2020-08-13 ENCOUNTER — Encounter: Payer: Self-pay | Admitting: Pulmonary Disease

## 2020-08-13 VITALS — BP 144/70 | HR 94 | Temp 97.2°F | Ht 60.0 in | Wt 144.0 lb

## 2020-08-13 DIAGNOSIS — J449 Chronic obstructive pulmonary disease, unspecified: Secondary | ICD-10-CM | POA: Diagnosis not present

## 2020-08-13 DIAGNOSIS — G4733 Obstructive sleep apnea (adult) (pediatric): Secondary | ICD-10-CM

## 2020-08-13 DIAGNOSIS — Z87891 Personal history of nicotine dependence: Secondary | ICD-10-CM

## 2020-08-13 NOTE — Progress Notes (Signed)
[facility]  Chief Complaint  Patient presents with  . Follow-up    No complaints currently    Constitutional:  BP (!) 144/70 (BP Location: Left Arm, Cuff Size: Normal)   Pulse 94   Temp (!) 97.2 F (36.2 C) (Other (Comment)) Comment (Src): wrist  Ht 5' (1.524 m)   Wt 144 lb (65.3 kg)   LMP  (LMP Unknown)   SpO2 96% Comment: Room air  BMI 28.12 kg/m   Past Medical History:  Anxiety, OA, Back pain, Colon polyps, Depression, COVID 07 July 2020  Past Surgical History:  She  has a past surgical history that includes Abdominal hysterectomy; Lumbar fusion (2012); Inner ear surgery; and Cervical spine surgery.  Brief Summary:  Holly Phillips is a 65 y.o. female former smoker with COPD/emphysema, and obstructive sleep apnea.      Subjective:   She had COVID in February.  This scared her, and she decided to quit smoking.  Using nicotine patch.  Hasn't smoked since March 1.  Has cough with thick, clear sputum.  Not having wheeze, chest pain, fever, leg swelling, or hemoptysis.  Uses CPAP nightly w/o difficulty.  Physical Exam:   Appearance - well kempt   ENMT - no sinus tenderness, no oral exudate, no LAN, Mallampati 3 airway, no stridor  Respiratory - decreased breath sounds bilaterally, no wheezing or rales  CV - s1s2 regular rate and rhythm, no murmurs  Ext - no clubbing, no edema  Skin - no rashes  Psych - normal mood and affect   Pulmonary testing:    Chest Imaging:   LDCT chest 01/19/20 >> moderate centrilobular emphysema  CT angio chest 07/18/20 >> centrilobular emphysema with subpleural scarring, mild ASD RUL  Sleep Tests:   HST 01/22/17 >> AHI 9.7  Social History:  She  reports that she has been smoking cigarettes. She has been smoking about 1.00 pack per day. She has never used smokeless tobacco. She reports previous alcohol use. She reports previous drug use. Drug: Other-see comments.  Family  History:  Her family history includes Asthma in her father and sister; Breast cancer in her sister; COPD in her father and sister; Depression in her mother; Diabetes in her mother and sister; Drug abuse in her sister; Heart attack in her maternal grandmother, mother, and sister; Hyperlipidemia in her mother; Hypertension in her mother; Lupus in her sister; Osteoarthritis in her father and sister.     Assessment/Plan:   COPD with emphysema and chronic bronchitis. - continue symbicort bid - prn albuterol - will plan to get PFTs at some point; defer until she is proven to remain off of cigarettes  Obstructive sleep apnea. - she reports compliance with CPAP and benefit from therapy - will get a copy of her CPAP download and call her with results  History of tobacco abuse. - encouraged her to keep up with smoking cessation efforts; she is using nicotine back  Lung cancer screening. - will arrange for her to see Eric Form to discuss re-establishing with low dose CT chest lung cancer screening program   Time Spent Involved in Patient Care on Day of Examination:  22 minutes  Follow up:  There are no Patient Instructions on file for this visit.  Medication List:   Allergies as of 08/13/2020      Reactions   Mucinex [guaifenesin Er] Other (See Comments)   Jerky movements      Medication List  Accurate as of August 13, 2020 10:53 AM. If you have any questions, ask your nurse or doctor.        STOP taking these medications   doxycycline 100 MG tablet Commonly known as: VIBRA-TABS Stopped by: Chesley Mires, MD   predniSONE 50 MG tablet Commonly known as: DELTASONE Stopped by: Chesley Mires, MD     TAKE these medications   acetaminophen 325 MG tablet Commonly known as: TYLENOL Take 2 tablets (650 mg total) by mouth every 6 (six) hours as needed for mild pain or headache (fever >/= 101).   albuterol 108 (90 Base) MCG/ACT inhaler Commonly known as: VENTOLIN HFA INHALE  1 TO 2 PUFFS INTO THE LUNGS EVERY 6 HOURS AS NEEDED FOR WHEEZING OR SHORTNESS OF BREATH   ARIPiprazole 5 MG tablet Commonly known as: ABILIFY Take 5 mg by mouth at bedtime.   ascorbic acid 500 MG tablet Commonly known as: VITAMIN C Take 1 tablet (500 mg total) by mouth daily.   atorvastatin 10 MG tablet Commonly known as: LIPITOR TAKE 1 TABLET(10 MG) BY MOUTH DAILY   budesonide-formoterol 160-4.5 MCG/ACT inhaler Commonly known as: SYMBICORT INHALE 2 PUFFS INTO THE LUNGS TWICE DAILY   celecoxib 100 MG capsule Commonly known as: CELEBREX TAKE 1 CAPSULE(100 MG) BY MOUTH DAILY   cyclobenzaprine 10 MG tablet Commonly known as: FLEXERIL Take 1 tablet (10 mg total) by mouth 3 (three) times daily.   escitalopram 20 MG tablet Commonly known as: LEXAPRO Take 20 mg by mouth daily.   gabapentin 800 MG tablet Commonly known as: NEURONTIN TAKE 1 TABLET(800 MG) BY MOUTH THREE TIMES DAILY   guaiFENesin-dextromethorphan 100-10 MG/5ML syrup Commonly known as: ROBITUSSIN DM Take 10 mLs by mouth every 4 (four) hours as needed for cough.   hydrOXYzine 50 MG capsule Commonly known as: VISTARIL Take 50 mg by mouth 4 (four) times daily.   lidocaine 5 % Commonly known as: LIDODERM Place 3 patches onto the skin daily as needed (pain).   Linzess 290 MCG Caps capsule Generic drug: linaclotide TAKE 1 CAPSULE(290 MCG) BY MOUTH DAILY BEFORE BREAKFAST   mirtazapine 45 MG tablet Commonly known as: REMERON Take 45 mg by mouth at bedtime. What changed: Another medication with the same name was removed. Continue taking this medication, and follow the directions you see here. Changed by: Chesley Mires, MD   mometasone-formoterol 200-5 MCG/ACT Aero Commonly known as: DULERA Inhale 2 puffs into the lungs 2 (two) times daily.   montelukast 10 MG tablet Commonly known as: SINGULAIR TAKE 1 TABLET(10 MG) BY MOUTH DAILY What changed: See the new instructions.   nicotine 21 mg/24hr patch Commonly  known as: NICODERM CQ - dosed in mg/24 hours Place 1 patch (21 mg total) onto the skin daily.   omeprazole 40 MG capsule Commonly known as: PRILOSEC Take 1 capsule (40 mg total) by mouth 2 (two) times daily.   oxyCODONE-acetaminophen 10-325 MG tablet Commonly known as: PERCOCET Take 1 tablet by mouth 4 (four) times daily as needed.   tiZANidine 4 MG tablet Commonly known as: ZANAFLEX Take by mouth.   traZODone 150 MG tablet Commonly known as: DESYREL Take 150 mg by mouth at bedtime.   zinc sulfate 220 (50 Zn) MG capsule Take 1 capsule (220 mg total) by mouth daily.       Signature:  Chesley Mires, MD [facility] Pager - [phone] 08/13/2020, 10:53 AM

## 2020-08-13 NOTE — Addendum Note (Signed)
Addended by: Chesley Mires on: 08/13/2020 11:22 AM   Modules accepted: Orders

## 2020-08-13 NOTE — Patient Instructions (Signed)
Will arrange for appointment with Holly Phillips to discuss continuing with low dose CT chest lung cancer screening program  Follow up in 1 year

## 2020-08-24 ENCOUNTER — Other Ambulatory Visit: Payer: Self-pay | Admitting: Pulmonary Disease

## 2020-08-24 ENCOUNTER — Other Ambulatory Visit: Payer: Self-pay

## 2020-08-24 MED ORDER — BUDESONIDE-FORMOTEROL FUMARATE 160-4.5 MCG/ACT IN AERO: 2.0000 | INHALATION_SPRAY | Freq: Two times a day (BID) | RESPIRATORY_TRACT | 11 refills | Status: DC

## 2020-09-17 ENCOUNTER — Ambulatory Visit
Admission: EM | Admit: 2020-09-17 | Discharge: 2020-09-17 | Disposition: A | Discharge status: Home / Self Care | Payer: Federal, State, Local not specified - PPO | Attending: Family Medicine | Admitting: Family Medicine

## 2020-09-17 ENCOUNTER — Ambulatory Visit (INDEPENDENT_AMBULATORY_CARE_PROVIDER_SITE_OTHER): Payer: Federal, State, Local not specified - PPO

## 2020-09-17 ENCOUNTER — Other Ambulatory Visit: Payer: Self-pay

## 2020-09-17 ENCOUNTER — Ambulatory Visit: Payer: Federal, State, Local not specified - PPO

## 2020-09-17 DIAGNOSIS — S93402S Sprain of unspecified ligament of left ankle, sequela: Secondary | ICD-10-CM

## 2020-09-17 DIAGNOSIS — M25572 Pain in left ankle and joints of left foot: Secondary | ICD-10-CM

## 2020-09-17 IMAGING — DX DG ANKLE COMPLETE 3+V*L*
3 series · 3 of 3 positions shown · non-contrast
Comparison: None.

CLINICAL DATA: Sprain 3 weeks ago, persisting pain

EXAM:
LEFT ANKLE COMPLETE - 3+ VIEW

[ankle ap]
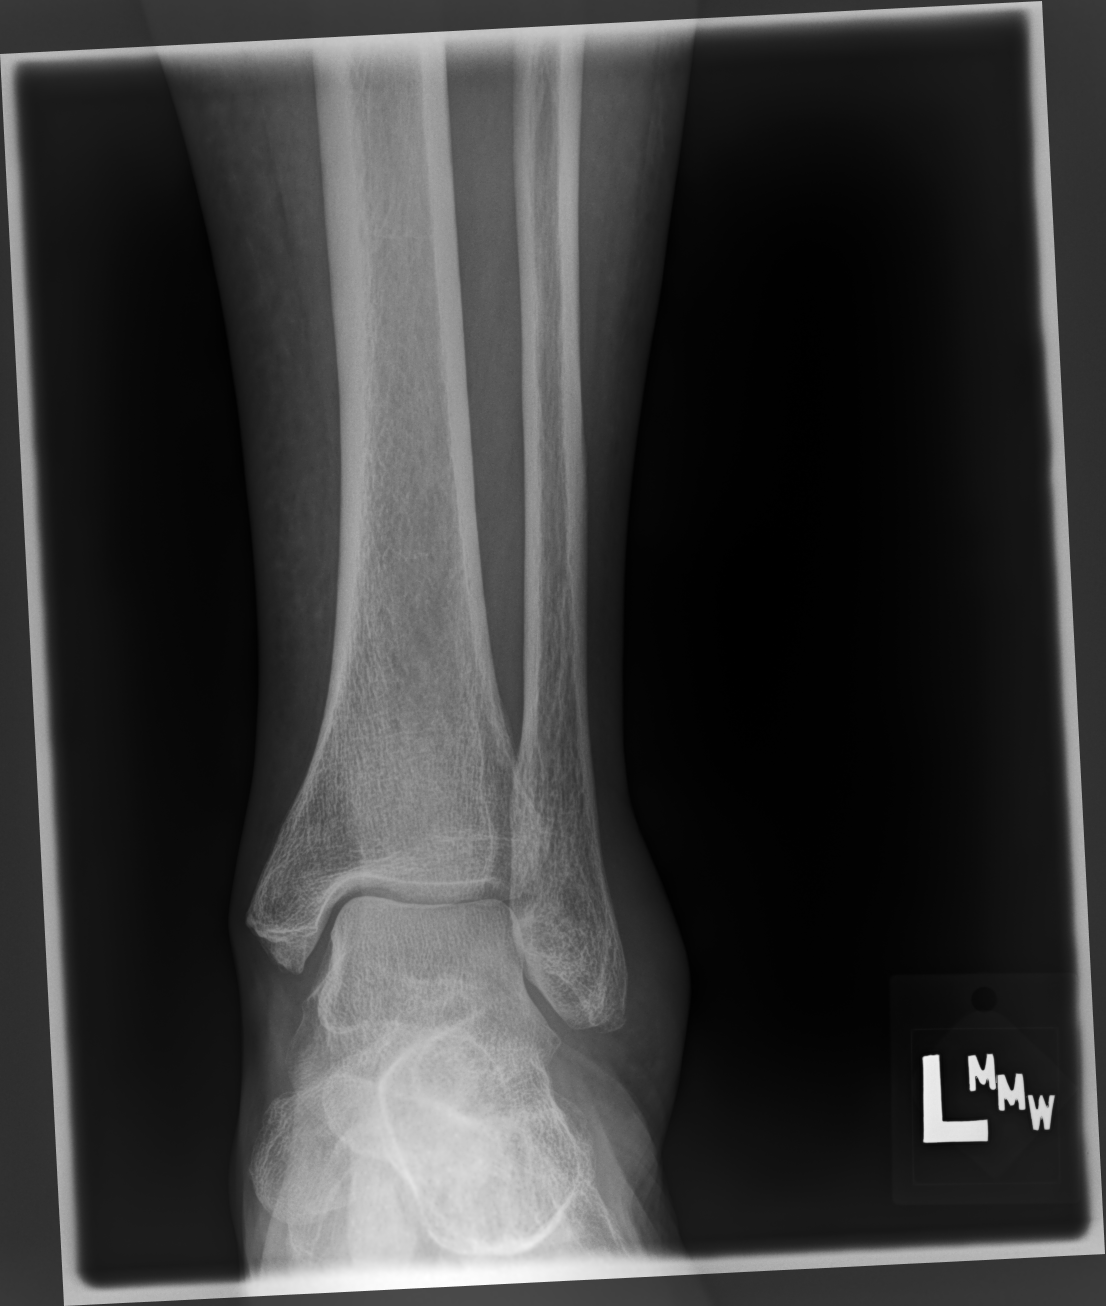

[ankle mlo]
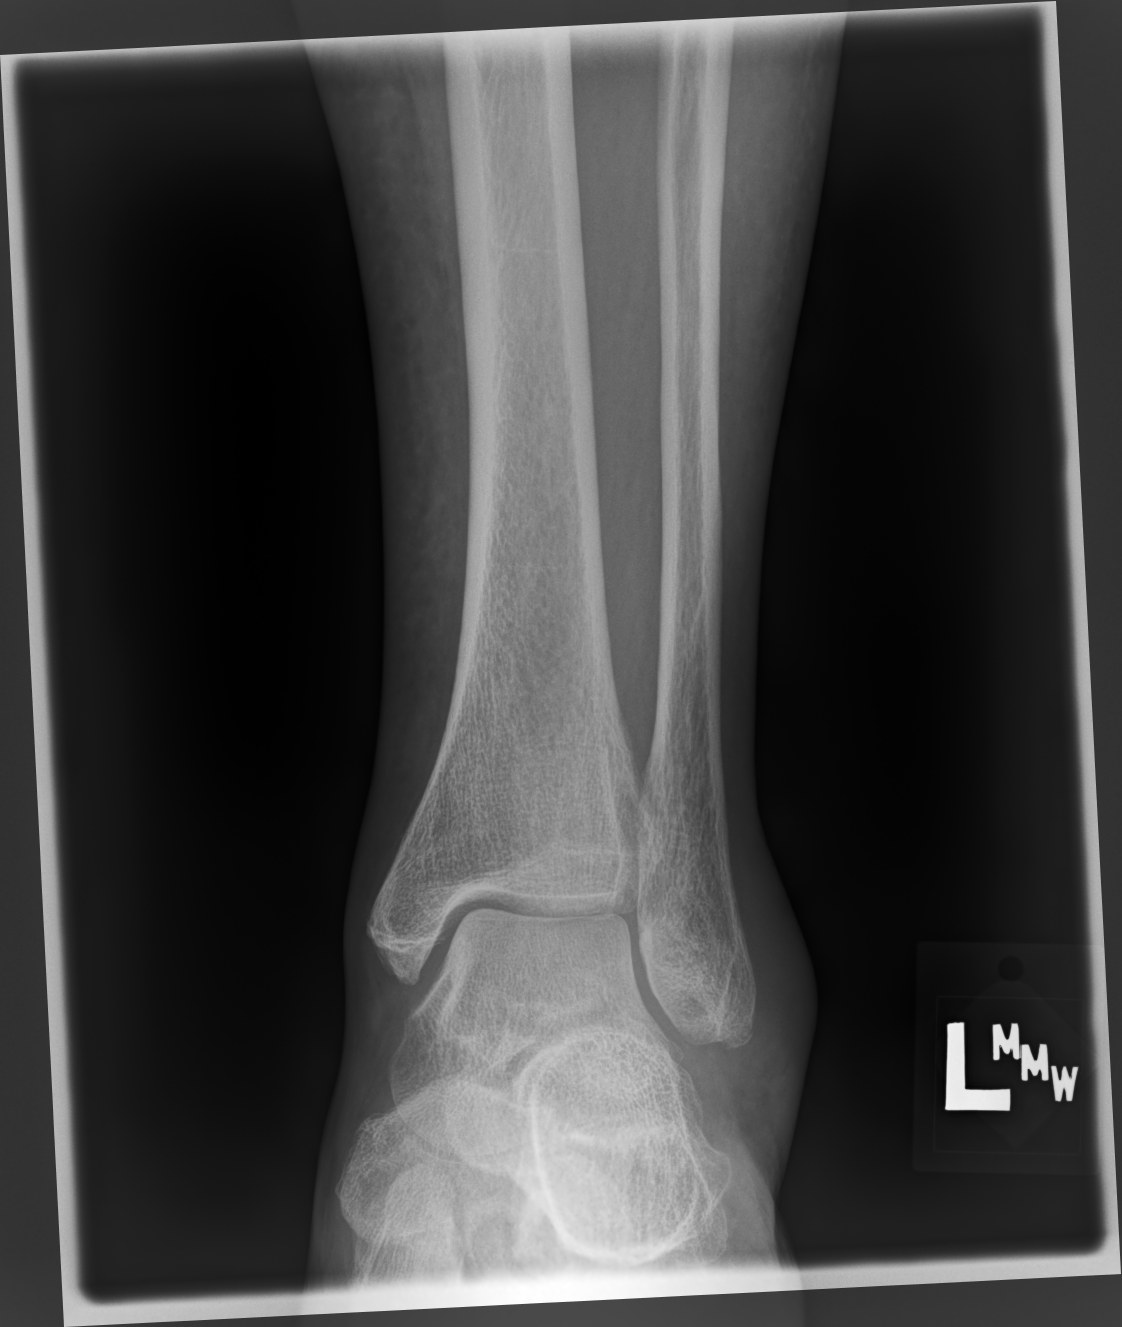

[ankle lat]
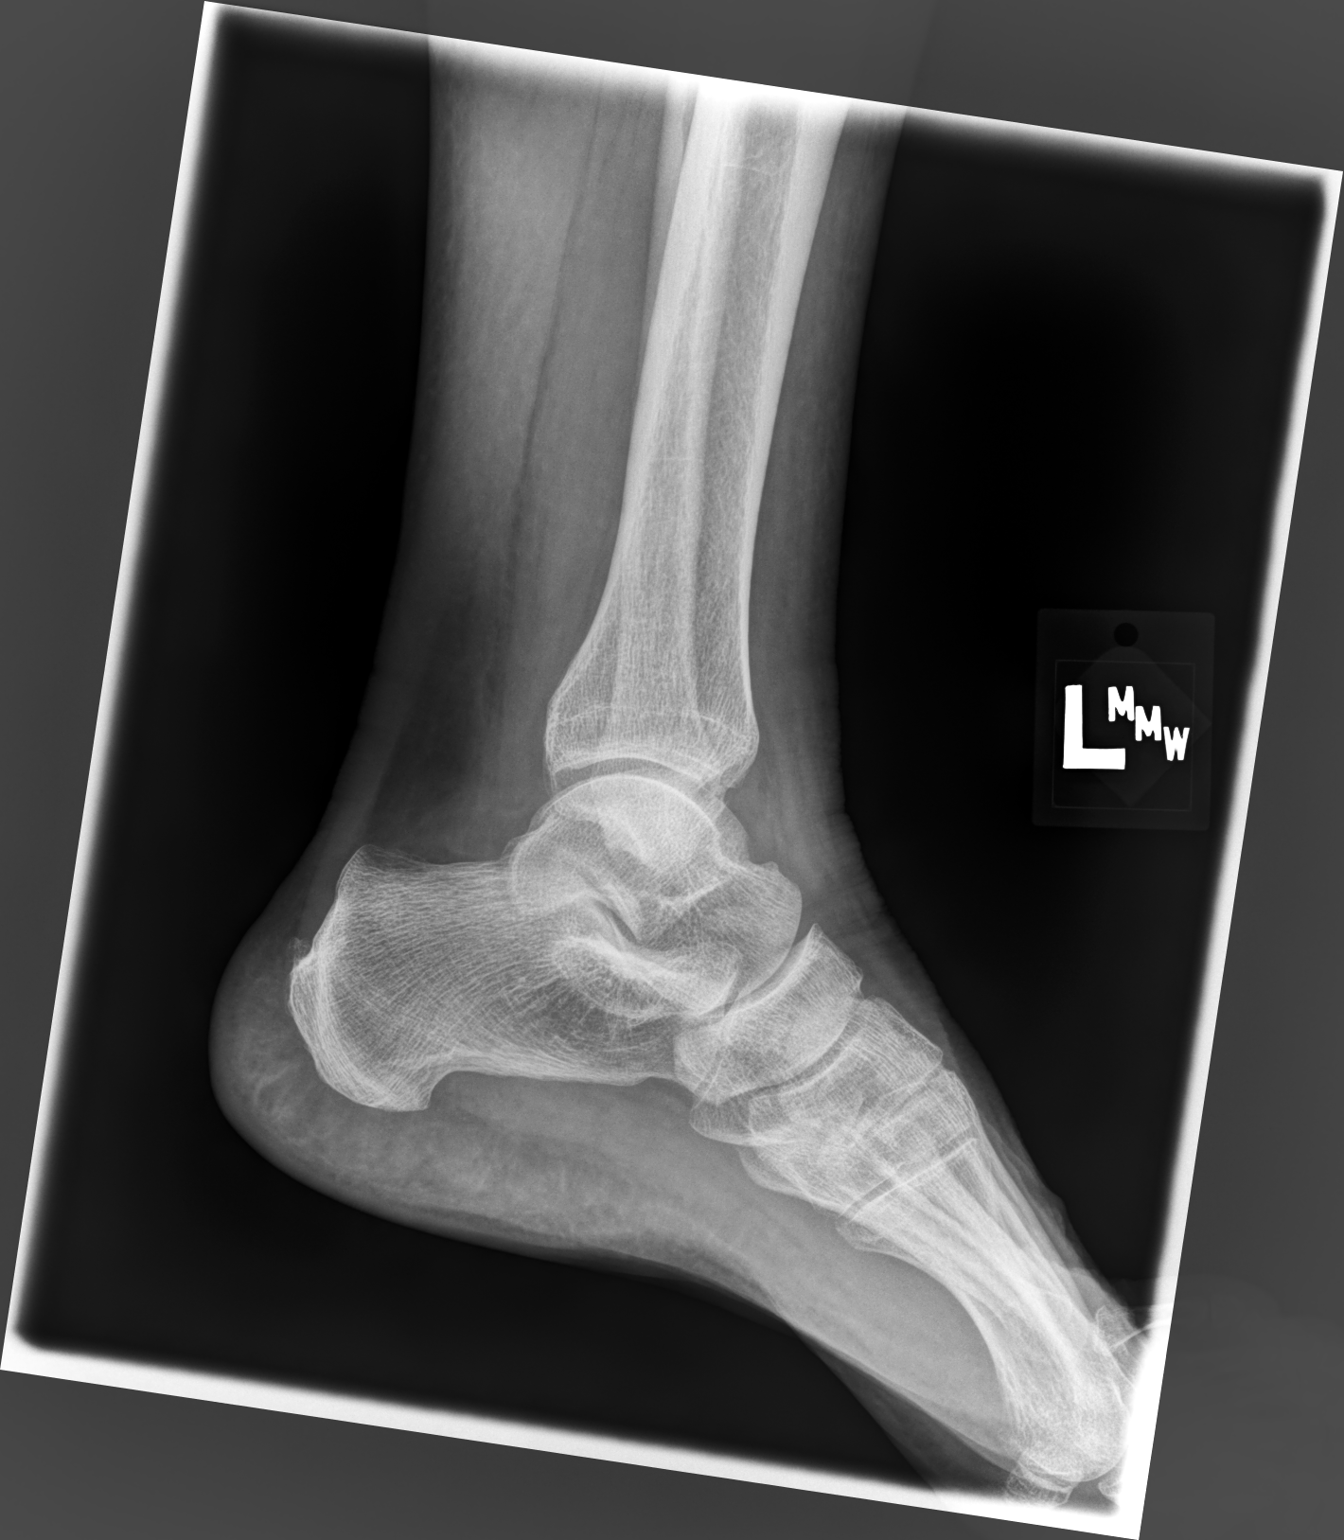

[3 of 3 positions shown; findings below may reference images not displayed]

FINDINGS: There is no evidence of fracture, dislocation, or joint effusion.
There is no evidence of arthropathy or other focal bone abnormality.
Soft tissue edema overlying the lateral malleolus.
IMPRESSION: No fracture or dislocation of the left ankle. Soft tissue edema
overlying the lateral malleolus.

## 2020-09-17 NOTE — ED Triage Notes (Signed)
Pt presents with left ankle pain from injury 3 weeks ago, swelling noted

## 2020-09-17 NOTE — Discharge Instructions (Addendum)
Follow-up with orthopedics if pain swelling has not resolved.

## 2020-10-16 ENCOUNTER — Other Ambulatory Visit: Payer: Self-pay | Admitting: Physician Assistant

## 2020-10-17 ENCOUNTER — Other Ambulatory Visit: Payer: Self-pay | Admitting: Family Medicine

## 2020-11-06 ENCOUNTER — Other Ambulatory Visit: Payer: Self-pay | Admitting: Physician Assistant

## 2020-12-07 ENCOUNTER — Other Ambulatory Visit: Payer: Self-pay | Admitting: Family Medicine

## 2020-12-27 NOTE — Progress Notes (Signed)
CARDIOLOGY CONSULT NOTE  Patient ID: Holly Phillips MRN: [id number] DOB/AGE: 66-Aug-1957 66 y.o.  Primary Physician: Inda Coke, PA  Reason for Consultation: Chest Pain / Family history of CAD   HPI: Holly Phillips is a 65 y.o. female referred for f/u by PA Morene Rankins Previously seen by Dr Bronson Ing She is a long time smoker with COPD. Originally from [place] in 2018 and now living with daughter She has GERD and is on PPI. Has had some atypical chest pains and family history of premature CAD. Most recent [facility]ovue done at [facility] was normal with no ischemia and EF 78% done on 09/26/19   She is a big [employer] fan. Watches all games with daughter   52 pack year history Covid in February Had been vaccinated quit smoking July 17, 2020 using patch Seen by Dr Halford Chessman using Symbicort and PRN  Albuterol    Has had vaccine for COVID Sees pain management for neck /back issues Has had issues with swallowing since anterior cervical neck surgery   Just returned from [place]. Still with family issues 29 yo grandson moved in with her and she returned A younger child to her dad in [place] Recent Rx for RLE skin infection but persistent swelling and leg pain Also wonders if she had TIA/Stroke having some word finding difficulty and ? aphasia   Family history: Mother underwent CABG at age 71 and died at the age of 85.  Her sister had 2 coronary artery stents placed at the age of 91.   Allergies  Allergen Reactions   Mucinex [Guaifenesin Er] Other (See Comments)    Jerky movements    Current Outpatient Medications  Medication Sig Dispense Refill   acetaminophen (TYLENOL) 325 MG tablet Take 2 tablets (650 mg total) by mouth every 6 (six) hours as needed for mild pain or headache (fever >/= 101). 12 tablet 0   albuterol (VENTOLIN HFA) 108 (90 Base) MCG/ACT inhaler INHALE 1 TO 2 PUFFS INTO THE LUNGS EVERY 6 HOURS AS NEEDED FOR WHEEZING OR  SHORTNESS OF BREATH 18 g 5   ARIPiprazole (ABILIFY) 5 MG tablet Take 5 mg by mouth at bedtime.     ascorbic acid (VITAMIN C) 500 MG tablet Take 1 tablet (500 mg total) by mouth daily. 30 tablet 2   atorvastatin (LIPITOR) 10 MG tablet TAKE 1 TABLET(10 MG) BY MOUTH DAILY 90 tablet 1   budesonide-formoterol (SYMBICORT) 160-4.5 MCG/ACT inhaler INHALE 2 PUFFS INTO THE LUNGS TWICE DAILY 10.2 g 3   budesonide-formoterol (SYMBICORT) 160-4.5 MCG/ACT inhaler Inhale 2 puffs into the lungs 2 (two) times daily. 10.2 g 11   celecoxib (CELEBREX) 100 MG capsule TAKE 1 CAPSULE(100 MG) BY MOUTH DAILY 90 capsule 0   cyclobenzaprine (FLEXERIL) 10 MG tablet Take 1 tablet (10 mg total) by mouth 3 (three) times daily. 90 tablet 1   escitalopram (LEXAPRO) 20 MG tablet Take 20 mg by mouth daily.     gabapentin (NEURONTIN) 800 MG tablet TAKE 1 TABLET(800 MG) BY MOUTH THREE TIMES DAILY 270 tablet 1   guaiFENesin-dextromethorphan (ROBITUSSIN DM) 100-10 MG/5ML syrup Take 10 mLs by mouth every 4 (four) hours as needed for cough. 118 mL 0   hydrOXYzine (VISTARIL) 50 MG capsule Take 50 mg by mouth 4 (four) times daily.     lidocaine (LIDODERM) 5 % Place 3 patches onto the skin daily as needed (pain).     LINZESS 290 MCG CAPS capsule TAKE 1 CAPSULE(290 MCG) BY MOUTH  DAILY BEFORE BREAKFAST 30 capsule 1   mirtazapine (REMERON) 45 MG tablet Take 45 mg by mouth at bedtime.     montelukast (SINGULAIR) 10 MG tablet TAKE 1 TABLET(10 MG) BY MOUTH DAILY (Patient taking differently: Take 10 mg by mouth at bedtime.) 90 tablet 1   nicotine (NICODERM CQ - DOSED IN MG/24 HOURS) 21 mg/24hr patch Place 1 patch (21 mg total) onto the skin daily. 28 patch 3   omeprazole (PRILOSEC) 40 MG capsule Take 1 capsule (40 mg total) by mouth 2 (two) times daily. 90 capsule 1   oxyCODONE-acetaminophen (PERCOCET) 10-325 MG tablet Take 1 tablet by mouth 4 (four) times daily as needed.     tiZANidine (ZANAFLEX) 4 MG tablet Take by mouth.     traZODone  (DESYREL) 150 MG tablet Take 150 mg by mouth at bedtime.     zinc sulfate 220 (50 Zn) MG capsule Take 1 capsule (220 mg total) by mouth daily. 30 capsule 1   No current facility-administered medications for this visit.    Past Medical History:  Diagnosis Date   Anxiety    Arthritis    Asthma    Chronic back pain    Colon polyps    COPD (chronic obstructive pulmonary disease) ([place])    Depression    Emphysema of lung ([place])     Past Surgical History:  Procedure Laterality Date   ABDOMINAL HYSTERECTOMY     CERVICAL SPINE SURGERY     INNER EAR SURGERY     LUMBAR FUSION  2010/08/15   L3-L7    Social History   Socioeconomic History   Marital status: Widowed    Spouse name: Not on file   Number of children: Not on file   Years of education: Not on file   Highest education level: Not on file  Occupational History   Not on file  Tobacco Use   Smoking status: Former    Packs/day: 1.00    Types: Cigarettes    Quit date: 07/17/2020    Years since quitting: 0.4   Smokeless tobacco: Never   Tobacco comments:    Not smoking cigarettes but is vaping  08/13/2020  Vaping Use   Vaping Use: Every day  Substance and Sexual Activity   Alcohol use: Not Currently   Drug use: Not Currently    Types: Other-see comments    Comment: CBD and medical marijuana   Sexual activity: Not Currently  Other Topics Concern   Not on file  Social History Narrative   Lives with daughter and 30 y/o grandson   From [place], moved to [place] in July 18, 2019   Husband passed away in Aug 14, 2016   Social Determinants of Health   Financial Resource Strain: Not on [employer] Insecurity: Not on file  Transportation Needs: Not on file  Physical Activity: Not on file  Stress: Not on file  Social Connections: Not on file  Intimate Partner Violence: Not on file      Current Meds  Medication Sig   acetaminophen (TYLENOL) 325 MG tablet Take 2 tablets (650 mg total) by mouth every 6 (six) hours as needed for mild  pain or headache (fever >/= 101).   albuterol (VENTOLIN HFA) 108 (90 Base) MCG/ACT inhaler INHALE 1 TO 2 PUFFS INTO THE LUNGS EVERY 6 HOURS AS NEEDED FOR WHEEZING OR SHORTNESS OF BREATH   ARIPiprazole (ABILIFY) 5 MG tablet Take 5 mg by mouth at bedtime.   ascorbic acid (VITAMIN C) 500 MG tablet Take  1 tablet (500 mg total) by mouth daily.   atorvastatin (LIPITOR) 10 MG tablet TAKE 1 TABLET(10 MG) BY MOUTH DAILY   budesonide-formoterol (SYMBICORT) 160-4.5 MCG/ACT inhaler INHALE 2 PUFFS INTO THE LUNGS TWICE DAILY   budesonide-formoterol (SYMBICORT) 160-4.5 MCG/ACT inhaler Inhale 2 puffs into the lungs 2 (two) times daily.   celecoxib (CELEBREX) 100 MG capsule TAKE 1 CAPSULE(100 MG) BY MOUTH DAILY   cyclobenzaprine (FLEXERIL) 10 MG tablet Take 1 tablet (10 mg total) by mouth 3 (three) times daily.   escitalopram (LEXAPRO) 20 MG tablet Take 20 mg by mouth daily.   gabapentin (NEURONTIN) 800 MG tablet TAKE 1 TABLET(800 MG) BY MOUTH THREE TIMES DAILY   guaiFENesin-dextromethorphan (ROBITUSSIN DM) 100-10 MG/5ML syrup Take 10 mLs by mouth every 4 (four) hours as needed for cough.   hydrOXYzine (VISTARIL) 50 MG capsule Take 50 mg by mouth 4 (four) times daily.   lidocaine (LIDODERM) 5 % Place 3 patches onto the skin daily as needed (pain).   LINZESS 290 MCG CAPS capsule TAKE 1 CAPSULE(290 MCG) BY MOUTH DAILY BEFORE BREAKFAST   mirtazapine (REMERON) 45 MG tablet Take 45 mg by mouth at bedtime.   montelukast (SINGULAIR) 10 MG tablet TAKE 1 TABLET(10 MG) BY MOUTH DAILY (Patient taking differently: Take 10 mg by mouth at bedtime.)   nicotine (NICODERM CQ - DOSED IN MG/24 HOURS) 21 mg/24hr patch Place 1 patch (21 mg total) onto the skin daily.   omeprazole (PRILOSEC) 40 MG capsule Take 1 capsule (40 mg total) by mouth 2 (two) times daily.   oxyCODONE-acetaminophen (PERCOCET) 10-325 MG tablet Take 1 tablet by mouth 4 (four) times daily as needed.   tiZANidine (ZANAFLEX) 4 MG tablet Take by mouth.   traZODone  (DESYREL) 150 MG tablet Take 150 mg by mouth at bedtime.   zinc sulfate 220 (50 Zn) MG capsule Take 1 capsule (220 mg total) by mouth daily.      Review of systems complete and found to be negative unless listed above in HPI  Affect appropriate COPD er  HEENT: normal Neck previous anterior cervical neck surgery  JVP normal no bruits no thyromegaly Lungs exp  wheezing and good diaphragmatic motion Heart:  S1/S2 no murmur, no rub, gallop or click PMI normal Abdomen: benighn, BS positve, no tenderness, no AAA no bruit.  No HSM or HJR Distal pulses intact with no bruits No edema Neuro speech does seem more slurred  Skin warm and dry No muscular weakness  ECG: 09/01/19 NSR normal ECG    Labs: Lab Results  Component Value Date/Time   K 4.1 07/13/2020 02:00 PM   BUN 11 07/13/2020 02:00 PM   CREATININE 0.77 07/13/2020 02:00 PM   ALT 18 07/13/2020 02:00 PM   TSH 0.59 08/01/2019 02:10 PM   HGB 12.0 07/13/2020 02:00 PM     Lipids: Lab Results  Component Value Date/Time   TRIG 68 06/28/2020 08:22 PM        ASSESSMENT AND PLAN:   1.  Chest Pain:  Atypical normal myovue 09/26/19 no ischemia EF 78% observe  2.  GERD:  Currently on omeprazole.  She does have dysphagia for solids.  3.  Tobacco use: She is a longtime smoker.CT no cancer / PE 07/18/20  Symbicort And albuterol Quit smoking after COVID March 2022 Nicotine patch F/ U Dr Halford Chessman   4. HLD : continue statin labs with primary   5. Edema:  PRN lasix infection healing f/u LE venous duplex r/o DVT  7. Neuro:  slurred speech f/u primary Non contrast head CT today ideally primary will order MRI  Concern for signs of para neoplastic syndrome with difficulty swallowing and slurred speech/stuttering  F/U Primary and Dr Halford Chessman pulmonary    Disposition: Follow up in  A year   Jenkins Rouge MD [facility]   01/04/2021, 11:49 AM

## 2021-01-04 ENCOUNTER — Ambulatory Visit (INDEPENDENT_AMBULATORY_CARE_PROVIDER_SITE_OTHER): Payer: Medicare Other | Admitting: Cardiovascular Disease

## 2021-01-04 ENCOUNTER — Ambulatory Visit (HOSPITAL_COMMUNITY)
Admission: RE | Admit: 2021-01-04 | Discharge: 2021-01-04 | Disposition: A | Discharge status: Home / Self Care | Payer: Medicare Other | Source: Ambulatory Visit | Attending: Cardiovascular Disease | Admitting: Cardiovascular Disease

## 2021-01-04 ENCOUNTER — Telehealth: Payer: Self-pay

## 2021-01-04 ENCOUNTER — Encounter: Payer: Self-pay | Admitting: Cardiovascular Disease

## 2021-01-04 ENCOUNTER — Other Ambulatory Visit: Payer: Self-pay

## 2021-01-04 VITALS — BP 126/68 | HR 98 | Ht 60.0 in | Wt 148.0 lb

## 2021-01-04 DIAGNOSIS — G459 Transient cerebral ischemic attack, unspecified: Secondary | ICD-10-CM | POA: Insufficient documentation

## 2021-01-04 DIAGNOSIS — R079 Chest pain, unspecified: Secondary | ICD-10-CM

## 2021-01-04 DIAGNOSIS — R609 Edema, unspecified: Secondary | ICD-10-CM

## 2021-01-04 DIAGNOSIS — R000 Tachycardia, unspecified: Secondary | ICD-10-CM | POA: Diagnosis not present

## 2021-01-04 IMAGING — CT CT HEAD W/O CM
3 series · 16 of 47 positions shown, 19 images · non-contrast
Comparison: None.

CLINICAL DATA: Transient ischemic attack (TIA), memory loss for 6
months

EXAM:
CT HEAD WITHOUT CONTRAST
TECHNIQUE: Contiguous axial images were obtained from the base of the skull
through the vertex without intravenous contrast.

[Series 2: head w o · axial · 0.48mm/px · z∈[+42,+167]mm · 10 of 30 slices shown, 13 images]
[im 3/30  brain]
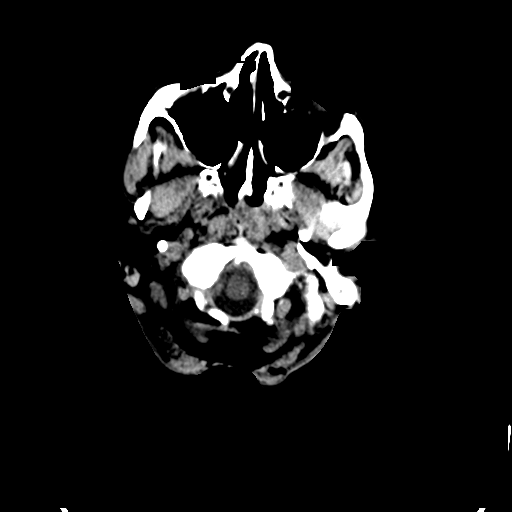
[im 3/30  bone]
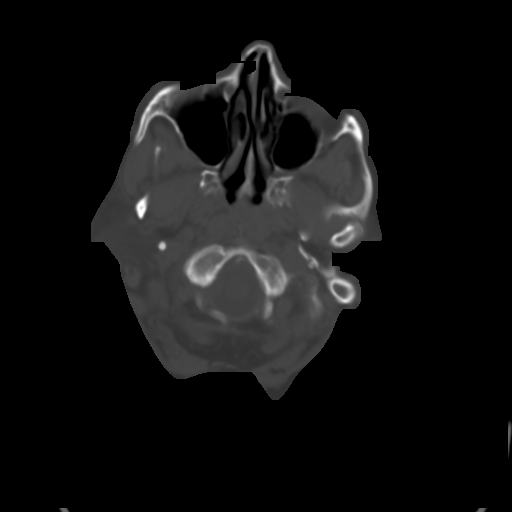
[im 6/30  brain]
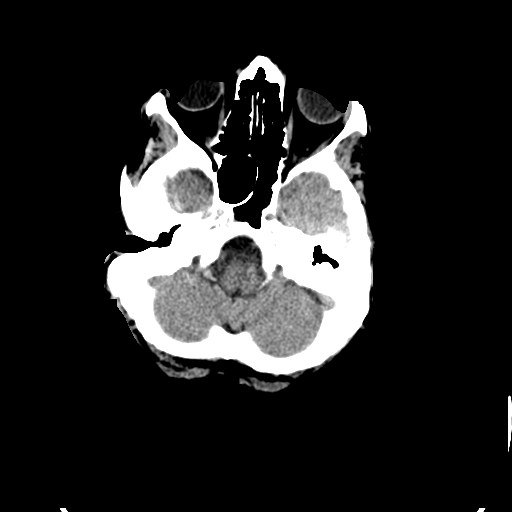
[im 9/30  brain]
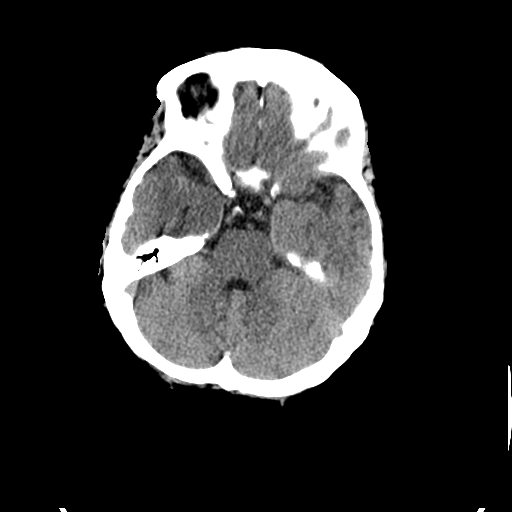
[im 11/30  brain]
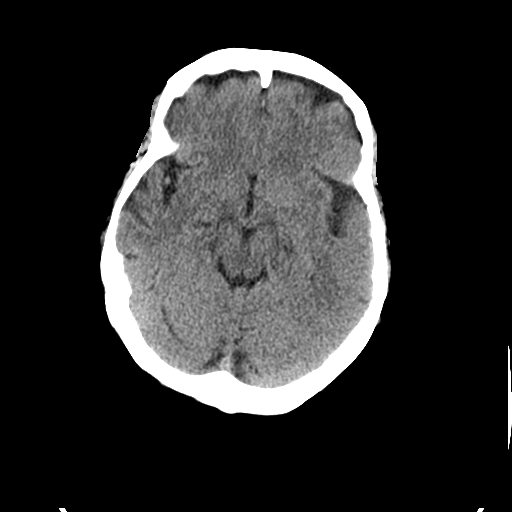
[im 14/30  brain]
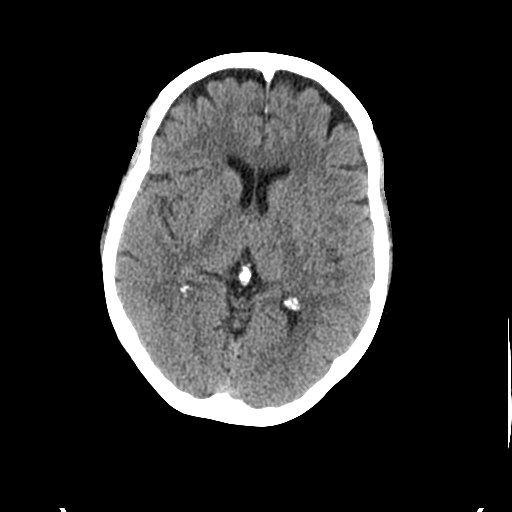
[im 14/30  bone]
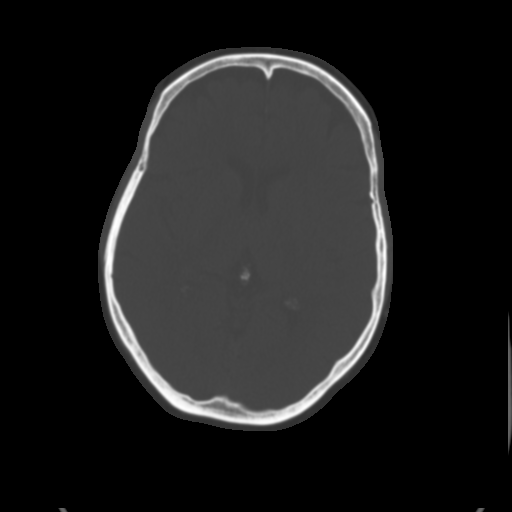
[im 17/30  brain]
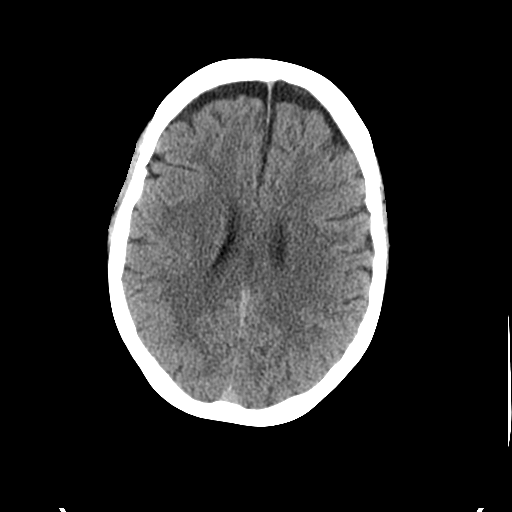
[im 20/30  brain]
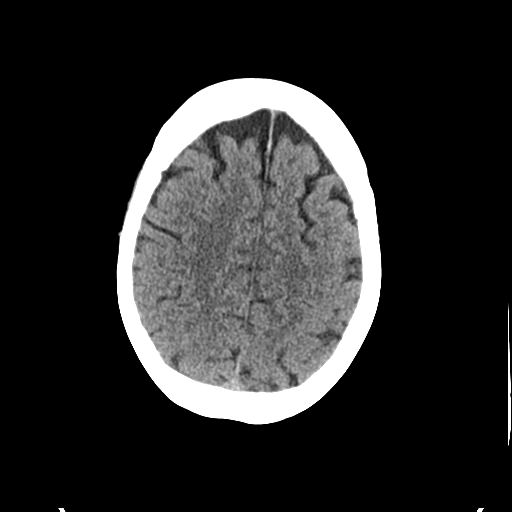
[im 23/30  brain]
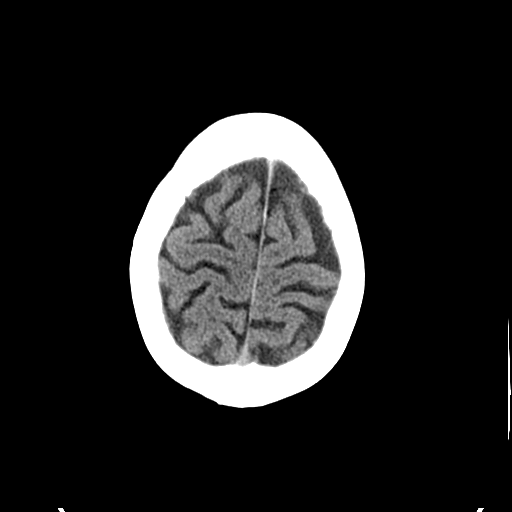
[im 25/30  brain]
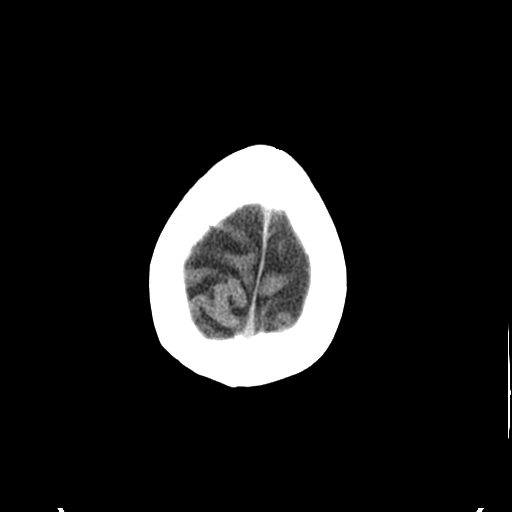
[im 25/30  bone]
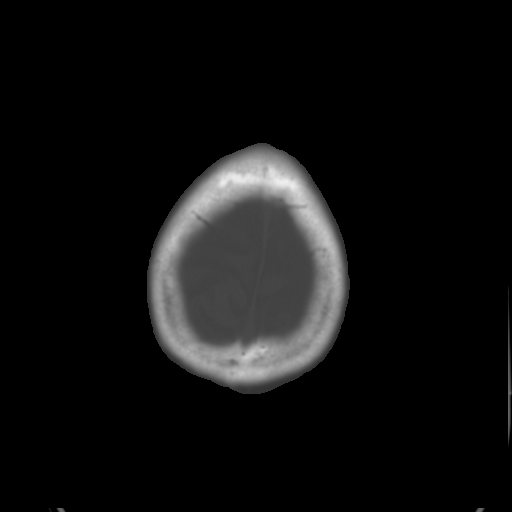
[im 28/30  brain]
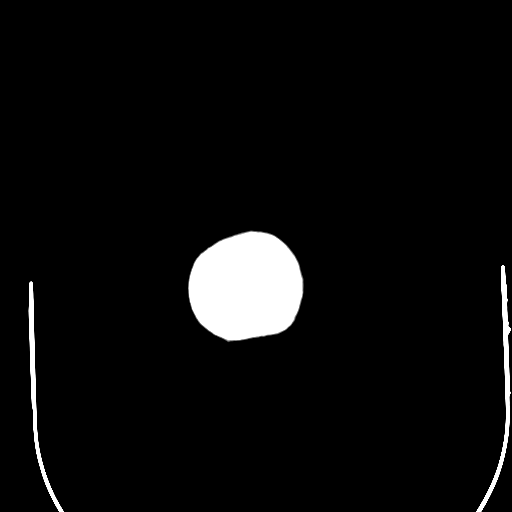

[Series 4: coronal soft · coronal · 0.32mm/px · 3 of 67 slices shown]
[im 23/67  brain]
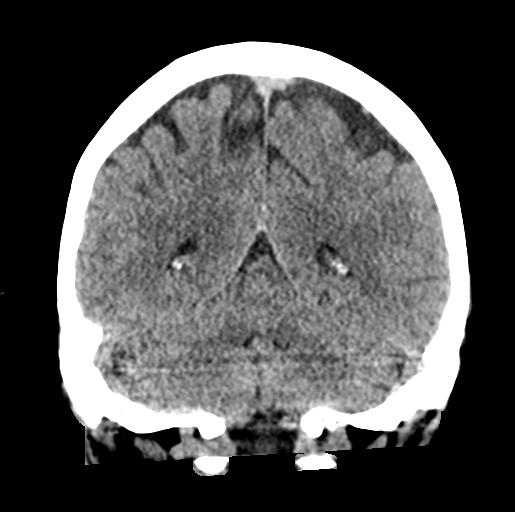
[im 30/67  brain]
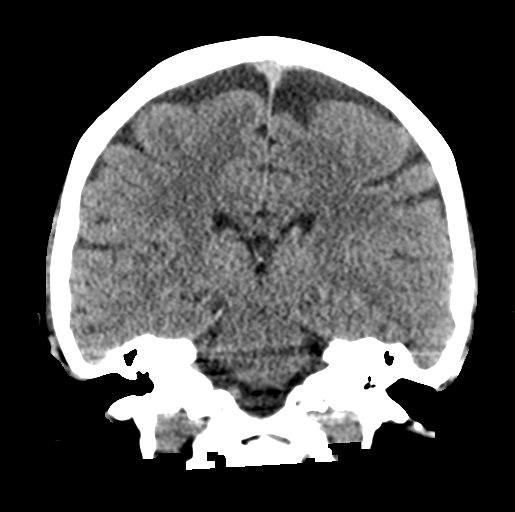
[im 37/67  brain]
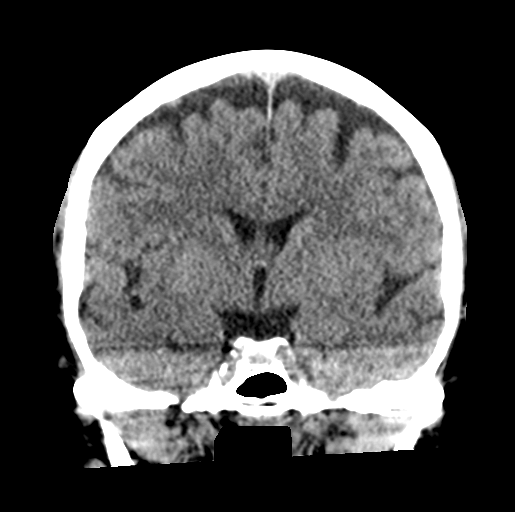

[Series 5: sagittal soft · sagittal · 0.32mm/px · 3 of 56 slices shown]
[im 19/56  brain]
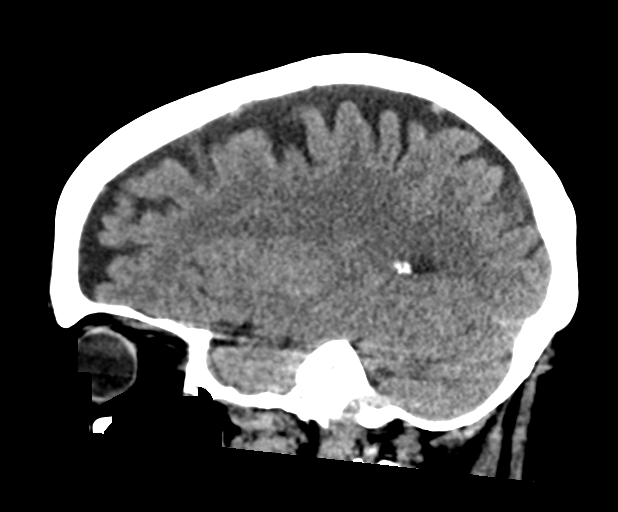
[im 28/56  brain]
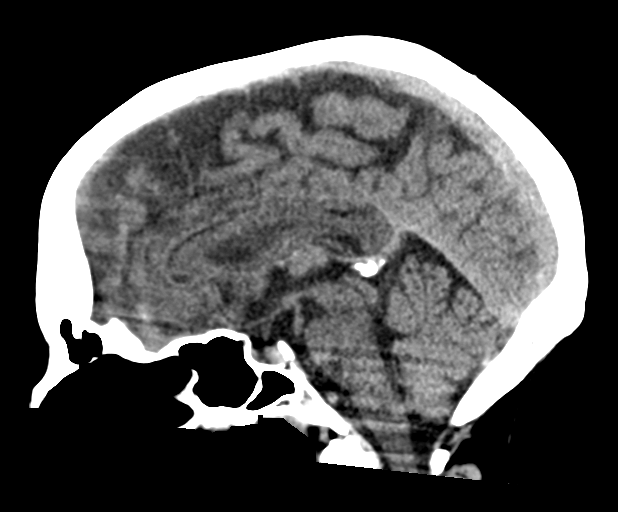
[im 37/56  brain]
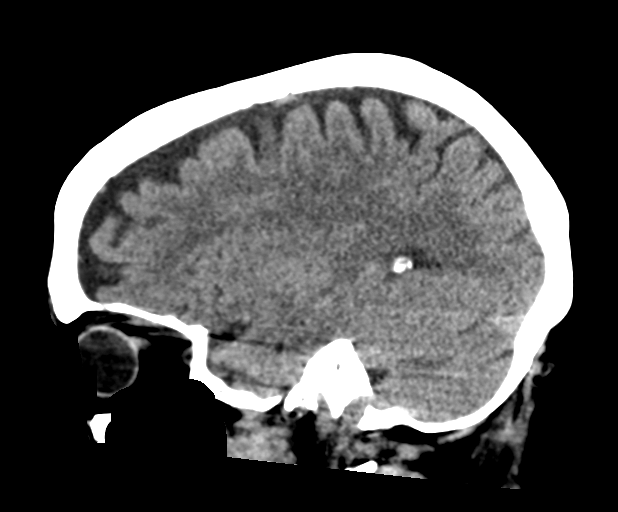

[16 of 47 positions shown; findings below may reference images not displayed]

FINDINGS: Brain: No evidence of acute infarction, hemorrhage, hydrocephalus,
extra-axial collection or mass lesion/mass effect.

Vascular: Atherosclerotic calcifications involving the large vessels
of the skull base. No unexpected hyperdense vessel.

Skull: Normal. Negative for fracture or focal lesion.

Sinuses/Orbits: No acute finding.

Other: None.
IMPRESSION: No acute intracranial findings.

## 2021-01-04 IMAGING — US US EXTREM LOW VENOUS*R*
1 series · 13 of 24 positions shown · non-contrast
Comparison: None.

CLINICAL DATA: Right lower extremity edema.



[Series 1: us extrem low venous*right* · 0.08mm/px · 13 of 39 slices shown]
[im 1/39]
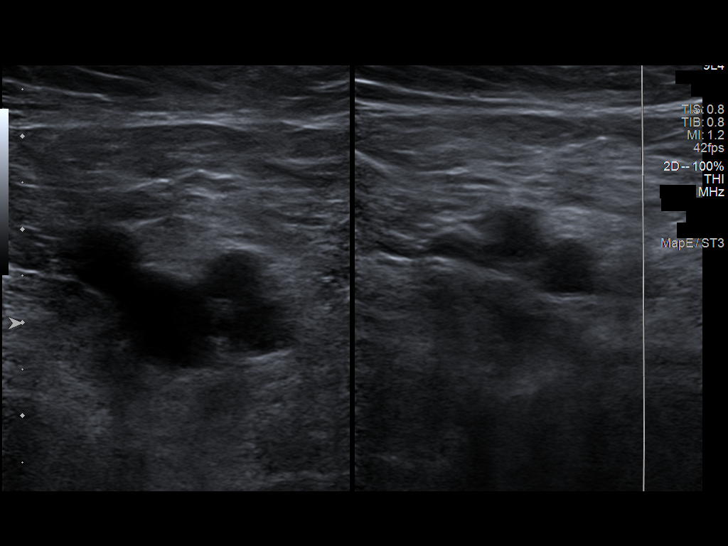
[im 4/39]
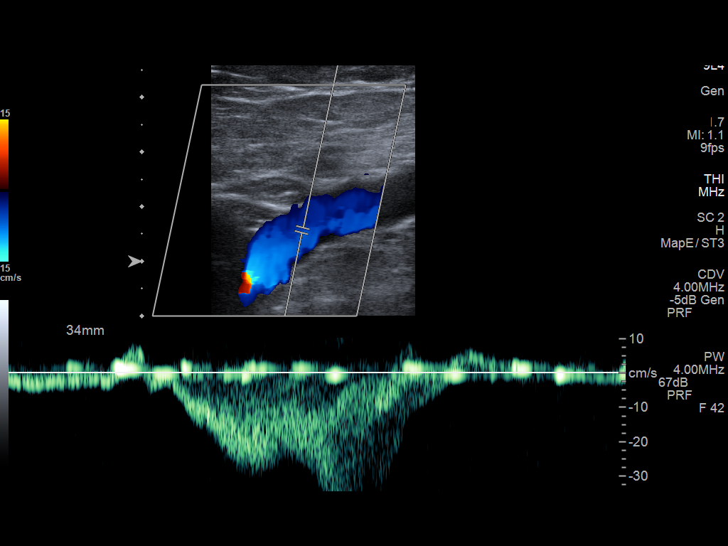
[im 7/39]
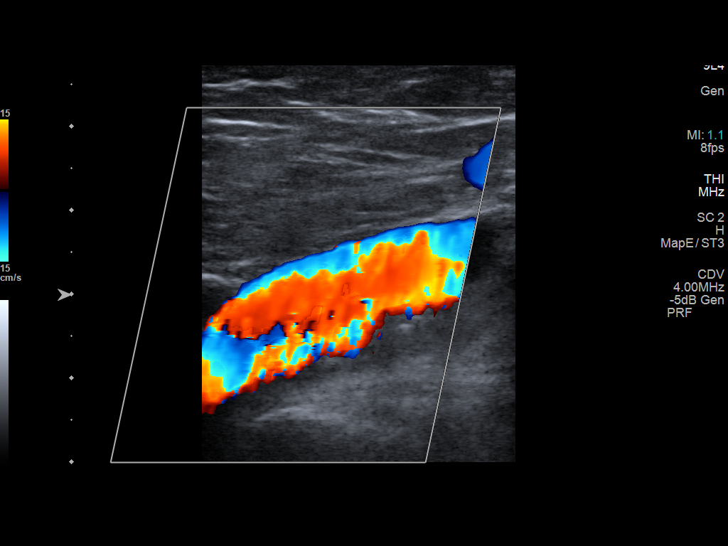
[im 10/39]
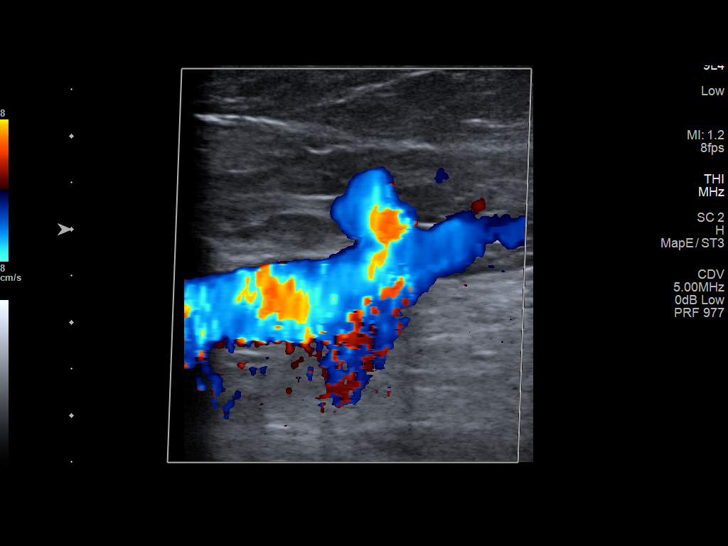
[im 14/39]
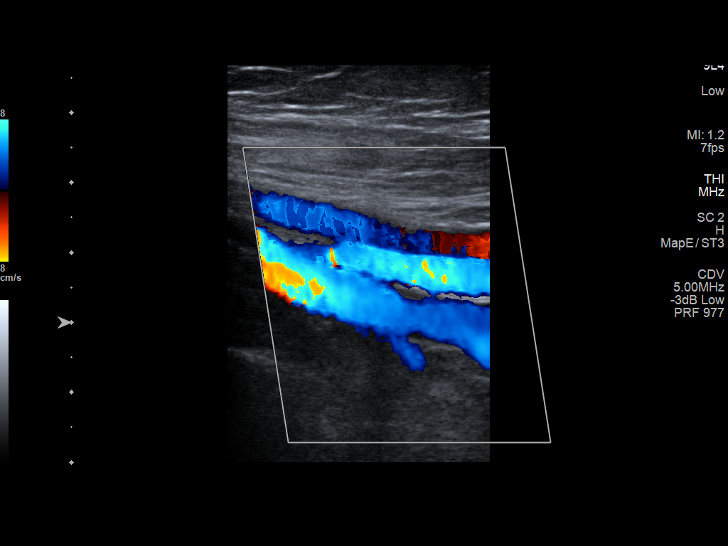
[im 17/39]
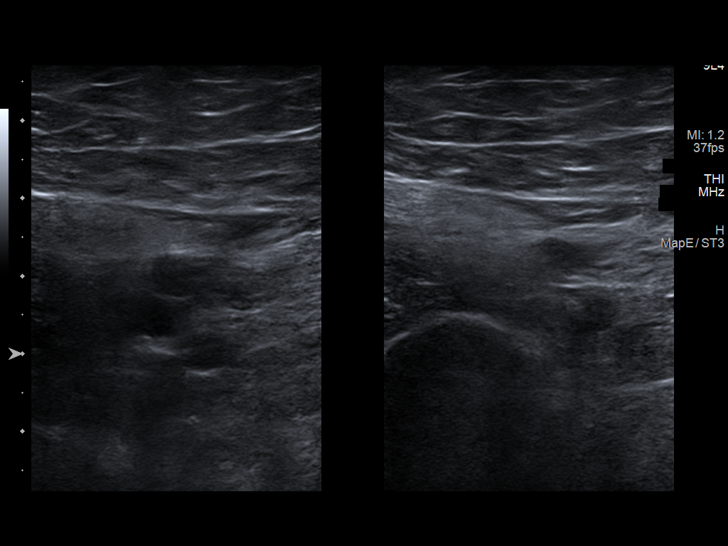
[im 20/39]
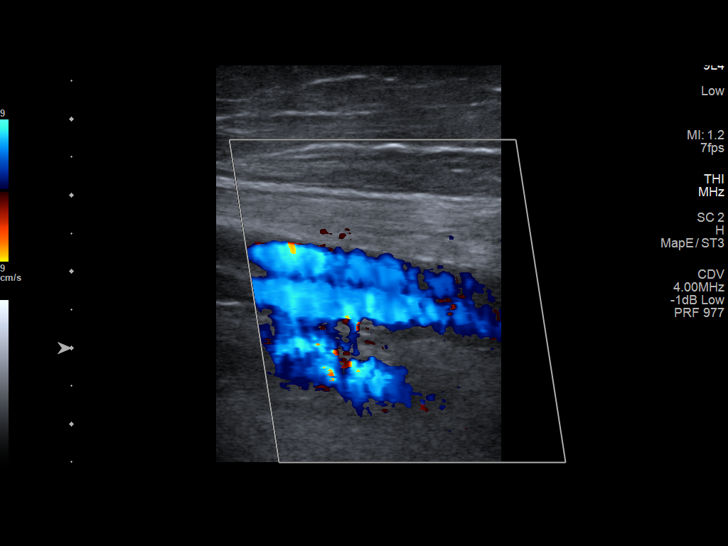
[im 22/39]
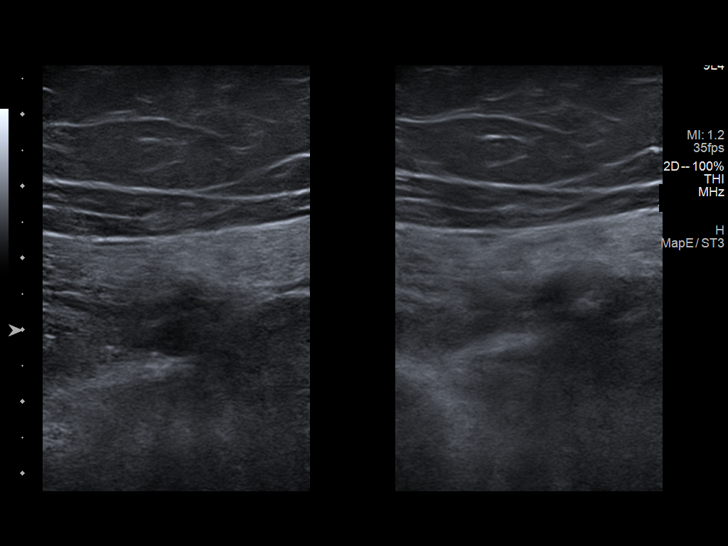
[im 25/39]
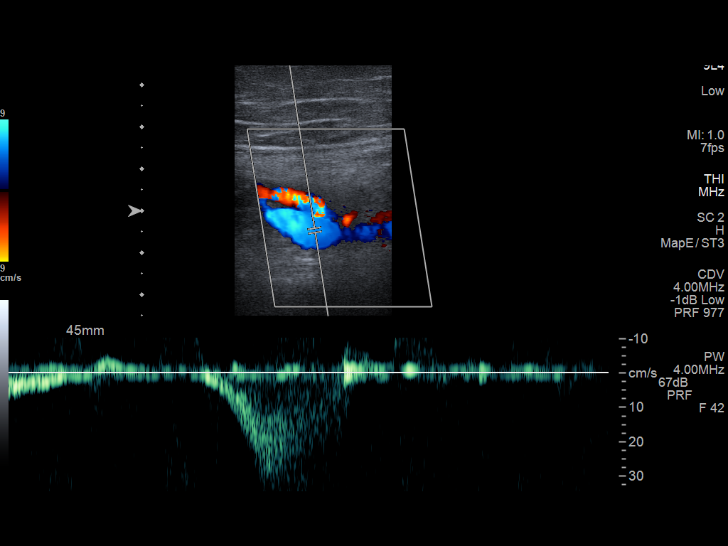
[im 29/39]
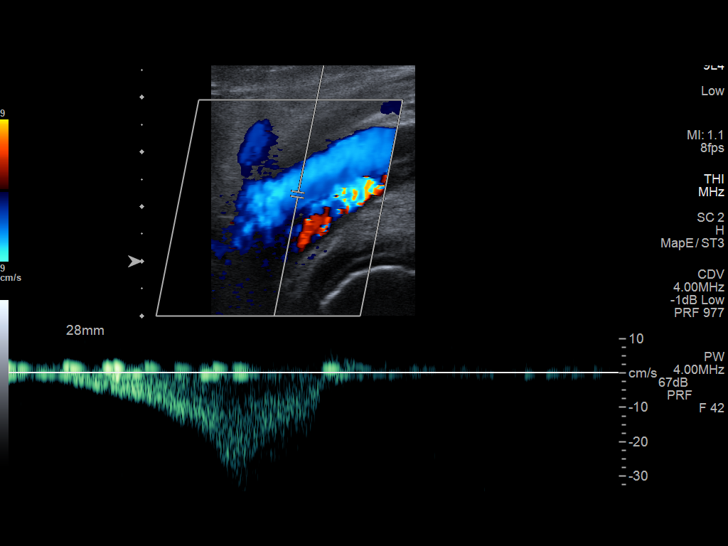
[im 32/39]
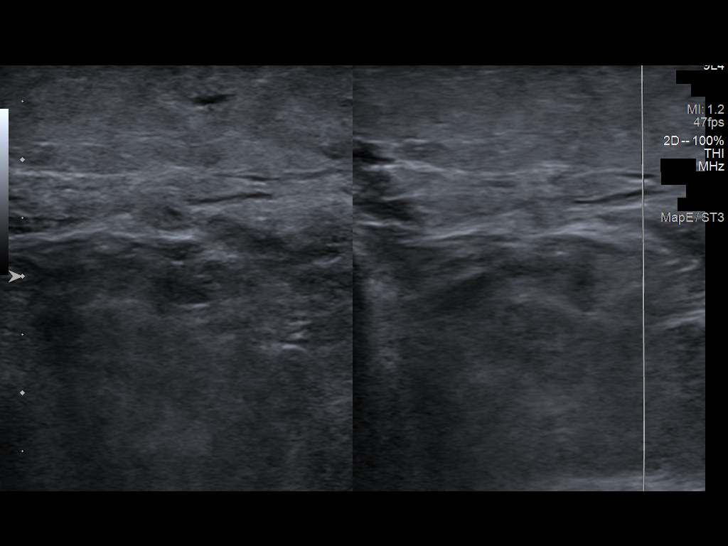
[im 35/39]
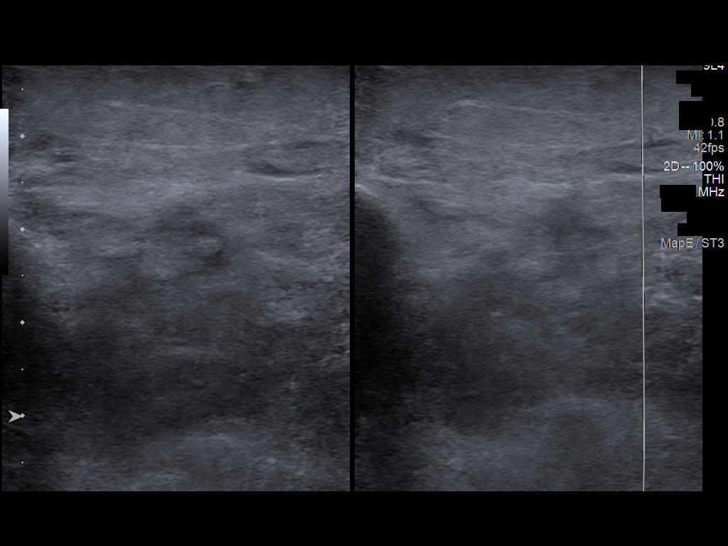
[im 39/39]
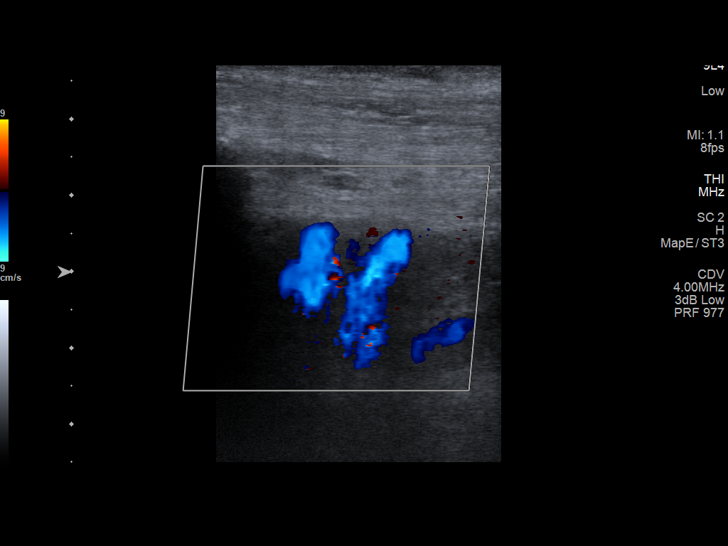

[13 of 24 positions shown; findings below may reference images not displayed]

FINDINGS: Contralateral Common Femoral Vein: Respiratory phasicity is normal
and symmetric with the symptomatic side. No evidence of thrombus.
Normal compressibility.

Common Femoral Vein: No evidence of thrombus. Normal
compressibility, respiratory phasicity and response to augmentation.

Saphenofemoral Junction: No evidence of thrombus. Normal
compressibility and flow on color Doppler imaging.

Profunda Femoral Vein: No evidence of thrombus. Normal
compressibility and flow on color Doppler imaging.

Femoral Vein: No evidence of thrombus. Normal compressibility,
respiratory phasicity and response to augmentation.

Popliteal Vein: No evidence of thrombus. Normal compressibility,
respiratory phasicity and response to augmentation.

Calf Veins: No evidence of thrombus. Normal compressibility and flow
on color Doppler imaging.

Superficial Great Saphenous Vein: No evidence of thrombus. Normal
compressibility.

Venous Reflux:  None.

Other Findings: No evidence of superficial thrombophlebitis or
abnormal fluid collection.
IMPRESSION: No evidence of right lower extremity deep venous thrombosis.

## 2021-01-04 MED ORDER — FUROSEMIDE 20 MG PO TABS: 20.0000 mg | ORAL_TABLET | Freq: Every day | ORAL | 11 refills | Status: DC | PRN

## 2021-01-04 NOTE — Patient Instructions (Signed)
Medication Instructions:   Take Lasix 20 mg Daily as needed for swelling   *If you need a refill on your cardiac medications before your next appointment, please call your pharmacy*   Lab Work: NONE   If you have labs (blood work) drawn today and your tests are completely normal, you will receive your results only by: [place] (if you have MyChart) OR A paper copy in the mail If you have any lab test that is abnormal or we need to change your treatment, we will call you to review the results.   Testing/Procedures: Your physician has requested that you have a lower or upper extremity arterial duplex. This test is an ultrasound of the arteries in the legs or arms. It looks at arterial blood flow in the legs and arms. Allow one hour for Lower and Upper Arterial scans. There are no restrictions or special instructions   NON contrast Head CT   Follow-Up: At [facility], you and your health needs are our priority.  As part of our continuing mission to provide you with exceptional heart care, we have created designated Provider Care Teams.  These Care Teams include your primary Cardiologist (physician) and Advanced Practice Providers (APPs -  Physician Assistants and Nurse Practitioners) who all work together to provide you with the care you need, when you need it.  We recommend signing up for the patient portal called "MyChart".  Sign up information is provided on this After Visit Summary.  MyChart is used to connect with patients for Virtual Visits (Telemedicine).  Patients are able to view lab/test results, encounter notes, upcoming appointments, etc.  Non-urgent messages can be sent to your provider as well.   To learn more about what you can do with MyChart, go to [email or web address].    Your next appointment:   6 month(s)  The format for your next appointment:   In Person  Provider:   Jenkins Rouge, MD   Other Instructions Thank you for choosing [facility]!

## 2021-01-04 NOTE — Telephone Encounter (Signed)
Pt notified and verbalized understanding. Pt had no questions or concerns at this tim

## 2021-01-04 NOTE — Telephone Encounter (Signed)
-----   Message from Josue Hector, MD sent at 01/04/2021  2:46 PM EDT ----- No DVT and head CT normal

## 2021-01-07 NOTE — ED Provider Notes (Signed)
[facility]    CSN: [id number] Arrival date & time: 09/17/20  1929      History   Chief Complaint Chief Complaint  Patient presents with   Ankle Pain    HPI Holly Phillips is a 65 y.o. female.   HPI Patient presents for evaluation of left ankle pain related to twisting injury x 3 weeks ago. Patient was walking subsequently twisted her left foot and developed pain in the left ankle. She has elevated and treated pain with OTC medication without relief.Endorses  persistent swelling. No prior injury involving the left ankle  Past Medical History:  Diagnosis Date   Anxiety    Arthritis    Asthma    Chronic back pain    Colon polyps    COPD (chronic obstructive pulmonary disease) ([place])    Depression    Emphysema of lung ([place])     Patient Active Problem List   Diagnosis Date Noted   COVID-19 virus infection 06/28/2020   Acute respiratory failure with hypoxia ([place]) 06/28/2020   Hypokalemia 06/28/2020   Lactic acidosis 06/28/2020   Mixed conductive and sensorineural hearing loss of both ears 08/25/2019   Dysphagia 08/25/2019   Tobacco abuse 08/01/2019   COPD (chronic obstructive pulmonary disease) ([place]) 08/01/2019   Arthritis 08/01/2019   History of lumbar fusion 08/01/2019   Recurrent depression ([place]) 08/01/2019   Anxiety 08/01/2019   Hyperlipidemia 08/01/2019   Constipation 08/01/2019   Gastroesophageal reflux disease 08/01/2019   Family history of heart disease 08/01/2019    Past Surgical History:  Procedure Laterality Date   ABDOMINAL HYSTERECTOMY     CERVICAL SPINE SURGERY     INNER EAR SURGERY     LUMBAR FUSION  2012   L3-L7    OB History   No obstetric history on file.      Home Medications    Prior to Admission medications   Medication Sig Start Date End Date Taking? Authorizing Provider  acetaminophen (TYLENOL) 325 MG tablet Take 2 tablets (650 mg total) by mouth every 6 (six) hours as needed for mild pain or headache (fever >/=  101). 07/01/20   Roxan Hockey, MD  albuterol (VENTOLIN HFA) 108 (90 Base) MCG/ACT inhaler INHALE 1 TO 2 PUFFS INTO THE LUNGS EVERY 6 HOURS AS NEEDED FOR WHEEZING OR SHORTNESS OF BREATH 11/07/20   Vivi Barrack, MD  ARIPiprazole (ABILIFY) 5 MG tablet Take 5 mg by mouth at bedtime. 06/14/20   [provider]  ascorbic acid (VITAMIN C) 500 MG tablet Take 1 tablet (500 mg total) by mouth daily. 07/02/20   Roxan Hockey, MD  atorvastatin (LIPITOR) 10 MG tablet TAKE 1 TABLET(10 MG) BY MOUTH DAILY 07/30/20   Inda Coke, PA  budesonide-formoterol [facility]) 160-4.5 MCG/ACT inhaler INHALE 2 PUFFS INTO THE LUNGS TWICE DAILY 08/24/20   Chesley Mires, MD  budesonide-formoterol [facility]) 160-4.5 MCG/ACT inhaler Inhale 2 puffs into the lungs 2 (two) times daily. 08/24/20   Chesley Mires, MD  celecoxib (CELEBREX) 100 MG capsule TAKE 1 CAPSULE(100 MG) BY MOUTH DAILY 12/07/20   Vivi Barrack, MD  cyclobenzaprine (FLEXERIL) 10 MG tablet Take 1 tablet (10 mg total) by mouth 3 (three) times daily. 08/01/19   Inda Coke, PA  escitalopram (LEXAPRO) 20 MG tablet Take 20 mg by mouth daily.    [provider]  furosemide (LASIX) 20 MG tablet Take 1 tablet (20 mg total) by mouth daily as needed for edema. 01/04/21 04/04/21  Josue Hector, MD  gabapentin (NEURONTIN)  800 MG tablet TAKE 1 TABLET(800 MG) BY MOUTH THREE TIMES DAILY 01/25/20   Inda Coke, PA  guaiFENesin-dextromethorphan (ROBITUSSIN DM) 100-10 MG/5ML syrup Take 10 mLs by mouth every 4 (four) hours as needed for cough. 07/01/20   Roxan Hockey, MD  hydrOXYzine (VISTARIL) 50 MG capsule Take 50 mg by mouth 4 (four) times daily. 06/14/20   [provider]  lidocaine (LIDODERM) 5 % Place 3 patches onto the skin daily as needed (pain). 01/16/20   [provider]  LINZESS 290 MCG CAPS capsule TAKE 1 CAPSULE(290 MCG) BY MOUTH DAILY BEFORE BREAKFAST 02/01/20   Inda Coke, PA  mirtazapine (REMERON) 45 MG tablet Take  45 mg by mouth at bedtime.    [provider]  montelukast (SINGULAIR) 10 MG tablet TAKE 1 TABLET(10 MG) BY MOUTH DAILY Patient taking differently: Take 10 mg by mouth at bedtime. 01/24/20   Inda Coke, PA  nicotine (NICODERM CQ - DOSED IN MG/24 HOURS) 21 mg/24hr patch Place 1 patch (21 mg total) onto the skin daily. 07/04/20   Inda Coke, PA  omeprazole (PRILOSEC) 40 MG capsule Take 1 capsule (40 mg total) by mouth 2 (two) times daily. 08/01/19   Inda Coke, PA  oxyCODONE-acetaminophen (PERCOCET) 10-325 MG tablet Take 1 tablet by mouth 4 (four) times daily as needed. 06/08/19   [provider]  tiZANidine (ZANAFLEX) 4 MG tablet Take by mouth. 06/30/20   [provider]  traZODone (DESYREL) 150 MG tablet Take 150 mg by mouth at bedtime. 08/26/19   [provider]  zinc sulfate 220 (50 Zn) MG capsule Take 1 capsule (220 mg total) by mouth daily. 07/02/20   Roxan Hockey, MD    Family History Family History  Problem Relation Age of Onset   Depression Mother    Diabetes Mother    Hypertension Mother    Hyperlipidemia Mother    Heart attack Mother    Osteoarthritis Father    Asthma Father    COPD Father    Breast cancer Sister    Heart attack Maternal Grandmother    Lupus Sister    Osteoarthritis Sister    Asthma Sister    COPD Sister    Diabetes Sister    Drug abuse Sister    Heart attack Sister    Colon cancer Neg Hx    Esophageal cancer Neg Hx     Social History Social History   Tobacco Use   Smoking status: Former    Packs/day: 1.00    Types: Cigarettes    Quit date: 07/17/2020    Years since quitting: 0.4   Smokeless tobacco: Never   Tobacco comments:    Not smoking cigarettes but is vaping  08/13/2020  Vaping Use   Vaping Use: Every day  Substance Use Topics   Alcohol use: Not Currently   Drug use: Not Currently    Types: Other-see comments    Comment: CBD and medical marijuana     Allergies   Mucinex  [guaifenesin er]   Review of Systems Review of Systems Pertinent negatives listed in HPI  Physical Exam Triage Vital Signs ED Triage Vitals  Enc Vitals Group     BP 09/17/20 1937 106/69     Pulse Rate 09/17/20 1937 82     Resp 09/17/20 1937 (!) 22     Temp 09/17/20 1937 97.9 F (36.6 C)     Temp src --      SpO2 09/17/20 1937 92 %     Weight --  Height --      Head Circumference --      Peak Flow --      Pain Score 09/17/20 1942 7     Pain Loc --      Pain Edu? --      Excl. in [place]? --    No data found.  Updated Vital Signs BP 106/69   Pulse 82   Temp 97.9 F (36.6 C)   Resp (!) 22   LMP  (LMP Unknown)   SpO2 92%   Visual Acuity Right Eye Distance:   Left Eye Distance:   Bilateral Distance:    Right Eye Near:   Left Eye Near:    Bilateral Near:     Physical Exam  General appearance: Alert, well developed, well nourished, cooperative  Head: Normocephalic, without obvious abnormality, atraumatic Respiratory: Respirations even and unlabored, normal respiratory rate Heart: Rate and Rhythm normal. No gallop or murmurs noted on exam  Extremities: left ankle swelling no gross deformity  Skin: Skin color, texture, turgor normal. No rashes seen  Psych: Appropriate mood and affect. Neurologic: GCS 15, normal coordination, normal gait  (all labs ordered are listed, but only abnormal results are displayed) Labs Reviewed - No data to display  EKG   Radiology Narrative & Impression  CLINICAL DATA:  Sprain 3 weeks ago, persisting pain   EXAM: LEFT ANKLE COMPLETE - 3+ VIEW   COMPARISON:  None.   FINDINGS: There is no evidence of fracture, dislocation, or joint effusion. There is no evidence of arthropathy or other focal bone abnormality. Soft tissue edema overlying the lateral malleolus.   IMPRESSION: No fracture or dislocation of the left ankle. Soft tissue edema overlying the lateral malleolus.     Electronically Signed   By: Eddie Candle M.D.    On: 09/17/2020 19:43    Procedures Procedures (including critical care time)  Medications Ordered in UC Medications - No data to display  Initial Impression / Assessment and Plan / UC Course  I have reviewed the triage vital signs and the nursing notes.  Pertinent labs & imaging results that were available during my care of the patient were reviewed by me and considered in my medical decision making (see chart for details).    Acute left ankle pain without fracture. Ankle lace up applied to left ankle. Continue home pain medication. Follow-up with orthopedic as needed  Final Clinical Impressions(s) / UC Diagnoses   Final diagnoses:  Acute left ankle pain     Discharge Instructions      Follow-up with orthopedics if pain swelling has not resolved.      ED Prescriptions   None    PDMP not reviewed this encounter.   Scot Jun, [place] 01/08/21 [id number]

## 2021-01-13 ENCOUNTER — Other Ambulatory Visit: Payer: Self-pay

## 2021-01-13 ENCOUNTER — Emergency Department (HOSPITAL_COMMUNITY)
Admission: EM | Admit: 2021-01-13 | Discharge: 2021-01-13 | Disposition: A | Discharge status: Home / Self Care | Payer: Medicare Other | Attending: Emergency Medicine | Admitting: Emergency Medicine

## 2021-01-13 ENCOUNTER — Encounter (HOSPITAL_COMMUNITY): Payer: Self-pay | Admitting: Emergency Medicine

## 2021-01-13 DIAGNOSIS — Z8616 Personal history of COVID-19: Secondary | ICD-10-CM | POA: Insufficient documentation

## 2021-01-13 DIAGNOSIS — Z87891 Personal history of nicotine dependence: Secondary | ICD-10-CM | POA: Insufficient documentation

## 2021-01-13 DIAGNOSIS — J449 Chronic obstructive pulmonary disease, unspecified: Secondary | ICD-10-CM | POA: Diagnosis not present

## 2021-01-13 DIAGNOSIS — J45909 Unspecified asthma, uncomplicated: Secondary | ICD-10-CM | POA: Diagnosis not present

## 2021-01-13 DIAGNOSIS — Z7951 Long term (current) use of inhaled steroids: Secondary | ICD-10-CM | POA: Diagnosis not present

## 2021-01-13 DIAGNOSIS — Z79899 Other long term (current) drug therapy: Secondary | ICD-10-CM | POA: Diagnosis not present

## 2021-01-13 DIAGNOSIS — M7989 Other specified soft tissue disorders: Secondary | ICD-10-CM

## 2021-01-13 DIAGNOSIS — R600 Localized edema: Secondary | ICD-10-CM | POA: Diagnosis not present

## 2021-01-13 DIAGNOSIS — R2241 Localized swelling, mass and lump, right lower limb: Secondary | ICD-10-CM | POA: Diagnosis present

## 2021-01-13 LAB — CBG MONITORING, ED: Glucose-Capillary: 93 mg/dL (ref 70–99)

## 2021-01-13 MED ORDER — ENOXAPARIN SODIUM 80 MG/0.8ML IJ SOSY
1.0000 mg/kg | PREFILLED_SYRINGE | Freq: Once | INTRAMUSCULAR | Status: AC
  Administered 2021-01-13: 67.5 mg via SUBCUTANEOUS
  Filled 2021-01-13: qty 0.8

## 2021-01-13 NOTE — ED Triage Notes (Signed)
Pt to the ED with right leg swelling from thigh to foot. Pt has been on antibiotics, lasix, and had an ultrasound to r/o DVT.  Swelling persists.

## 2021-01-13 NOTE — ED Notes (Signed)
Swelling to RLE, denies pain but c/o tender/soreness when touched. Reports having a blister to right foot (at great toe) and right lower leg, while in [place] 3 weeks ago.  Treated two antibiotics and given t-Dap.

## 2021-01-13 NOTE — ED Provider Notes (Signed)
[facility] EMERGENCY DEPARTMENT Provider Note   CSN: [id number] Arrival date & time: 01/13/21  1117     History Chief Complaint  Patient presents with   Leg Swelling    Holly Phillips is a 65 y.o. female.  HPI  65 year old female with a history of anxiety, arthritis, asthma, chronic back pain, colon polyps, COPD, depression, emphysema, presents the emergency department today for evaluation of right lower extremity swelling.  States swelling has been present for the last 3 weeks.  She recently had a long trip to [place] where she drove there.  While she was in [place] she had a blister to the right foot and an abrasion to the anterior shin.  She was seen at an urgent care at that time was placed on 2 antibiotics, Tdap updated.  Swelling has persisted since then and she recently had an appointment with her cardiologist who ordered right lower extremity ultrasound which was negative for DVT.  She presents for persistent symptoms.  She further complains of calf pain.  There is no numbness.  Denies any chest pain or shortness of breath.  She is also requesting that we test her blood sugar.   Past Medical History:  Diagnosis Date   Anxiety    Arthritis    Asthma    Chronic back pain    Colon polyps    COPD (chronic obstructive pulmonary disease) ([place])    Depression    Emphysema of lung ([place])     Patient Active Problem List   Diagnosis Date Noted   COVID-19 virus infection 06/28/2020   Acute respiratory failure with hypoxia ([place]) 06/28/2020   Hypokalemia 06/28/2020   Lactic acidosis 06/28/2020   Mixed conductive and sensorineural hearing loss of both ears 08/25/2019   Dysphagia 08/25/2019   Tobacco abuse 08/01/2019   COPD (chronic obstructive pulmonary disease) ([place]) 08/01/2019   Arthritis 08/01/2019   History of lumbar fusion 08/01/2019   Recurrent depression ([place]) 08/01/2019   Anxiety 08/01/2019   Hyperlipidemia 08/01/2019   Constipation 08/01/2019    Gastroesophageal reflux disease 08/01/2019   Family history of heart disease 08/01/2019    Past Surgical History:  Procedure Laterality Date   ABDOMINAL HYSTERECTOMY     CERVICAL SPINE SURGERY     INNER EAR SURGERY     LUMBAR FUSION  2012   L3-L7     OB History   No obstetric history on file.     Family History  Problem Relation Age of Onset   Depression Mother    Diabetes Mother    Hypertension Mother    Hyperlipidemia Mother    Heart attack Mother    Osteoarthritis Father    Asthma Father    COPD Father    Breast cancer Sister    Heart attack Maternal Grandmother    Lupus Sister    Osteoarthritis Sister    Asthma Sister    COPD Sister    Diabetes Sister    Drug abuse Sister    Heart attack Sister    Colon cancer Neg Hx    Esophageal cancer Neg Hx     Social History   Tobacco Use   Smoking status: Former    Packs/day: 0.50    Types: Cigarettes    Quit date: 07/17/2020    Years since quitting: 0.4   Smokeless tobacco: Never   Tobacco comments:    Not smoking cigarettes but is vaping  08/13/2020  Vaping Use   Vaping Use: Every day   Substances:  Nicotine, Flavoring  Substance Use Topics   Alcohol use: Not Currently   Drug use: Not Currently    Types: Other-see comments    Comment: CBD and medical marijuana- denies 01/13/21    Home Medications Prior to Admission medications   Medication Sig Start Date End Date Taking? Authorizing Provider  acetaminophen (TYLENOL) 325 MG tablet Take 2 tablets (650 mg total) by mouth every 6 (six) hours as needed for mild pain or headache (fever >/= 101). 07/01/20   Roxan Hockey, MD  albuterol (VENTOLIN HFA) 108 (90 Base) MCG/ACT inhaler INHALE 1 TO 2 PUFFS INTO THE LUNGS EVERY 6 HOURS AS NEEDED FOR WHEEZING OR SHORTNESS OF BREATH 11/07/20   Vivi Barrack, MD  ARIPiprazole (ABILIFY) 5 MG tablet Take 5 mg by mouth at bedtime. 06/14/20   [provider]  ascorbic acid (VITAMIN C) 500 MG tablet Take 1 tablet (500 mg  total) by mouth daily. 07/02/20   Roxan Hockey, MD  atorvastatin (LIPITOR) 10 MG tablet TAKE 1 TABLET(10 MG) BY MOUTH DAILY 07/30/20   Inda Coke, PA  budesonide-formoterol [facility]) 160-4.5 MCG/ACT inhaler INHALE 2 PUFFS INTO THE LUNGS TWICE DAILY 08/24/20   Chesley Mires, MD  budesonide-formoterol [facility]) 160-4.5 MCG/ACT inhaler Inhale 2 puffs into the lungs 2 (two) times daily. 08/24/20   Chesley Mires, MD  celecoxib (CELEBREX) 100 MG capsule TAKE 1 CAPSULE(100 MG) BY MOUTH DAILY 12/07/20   Vivi Barrack, MD  cyclobenzaprine (FLEXERIL) 10 MG tablet Take 1 tablet (10 mg total) by mouth 3 (three) times daily. 08/01/19   Inda Coke, PA  escitalopram (LEXAPRO) 20 MG tablet Take 20 mg by mouth daily.    [provider]  furosemide (LASIX) 20 MG tablet Take 1 tablet (20 mg total) by mouth daily as needed for edema. 01/04/21 04/04/21  Josue Hector, MD  gabapentin (NEURONTIN) 800 MG tablet TAKE 1 TABLET(800 MG) BY MOUTH THREE TIMES DAILY 01/25/20   Inda Coke, PA  guaiFENesin-dextromethorphan (ROBITUSSIN DM) 100-10 MG/5ML syrup Take 10 mLs by mouth every 4 (four) hours as needed for cough. 07/01/20   Roxan Hockey, MD  hydrOXYzine (VISTARIL) 50 MG capsule Take 50 mg by mouth 4 (four) times daily. 06/14/20   [provider]  lidocaine (LIDODERM) 5 % Place 3 patches onto the skin daily as needed (pain). 01/16/20   [provider]  LINZESS 290 MCG CAPS capsule TAKE 1 CAPSULE(290 MCG) BY MOUTH DAILY BEFORE BREAKFAST 02/01/20   Inda Coke, PA  mirtazapine (REMERON) 45 MG tablet Take 45 mg by mouth at bedtime.    [provider]  montelukast (SINGULAIR) 10 MG tablet TAKE 1 TABLET(10 MG) BY MOUTH DAILY Patient taking differently: Take 10 mg by mouth at bedtime. 01/24/20   Inda Coke, PA  nicotine (NICODERM CQ - DOSED IN MG/24 HOURS) 21 mg/24hr patch Place 1 patch (21 mg total) onto the skin daily. 07/04/20   Inda Coke, PA  omeprazole  (PRILOSEC) 40 MG capsule Take 1 capsule (40 mg total) by mouth 2 (two) times daily. 08/01/19   Inda Coke, PA  oxyCODONE-acetaminophen (PERCOCET) 10-325 MG tablet Take 1 tablet by mouth 4 (four) times daily as needed. 06/08/19   [provider]  tiZANidine (ZANAFLEX) 4 MG tablet Take by mouth. 06/30/20   [provider]  traZODone (DESYREL) 150 MG tablet Take 150 mg by mouth at bedtime. 08/26/19   [provider]  zinc sulfate 220 (50 Zn) MG capsule Take 1 capsule (220 mg total) by mouth daily.  07/02/20   Roxan Hockey, MD    Allergies    Mucinex [guaifenesin er]  Review of Systems   Review of Systems  Constitutional:  Negative for fever.  Respiratory:  Negative for cough and shortness of breath.   Cardiovascular:  Positive for leg swelling. Negative for chest pain.  Genitourinary:  Negative for flank pain.  Neurological:  Negative for headaches.  All other systems reviewed and are negative.  Physical Exam Updated Vital Signs BP (!) 149/81 (BP Location: Right Arm)   Pulse 87   Temp 97.8 F (36.6 C) (Oral)   Resp 16   Ht 5' (1.524 m)   Wt 67.1 kg   LMP  (LMP Unknown)   SpO2 96%   BMI 28.90 kg/m   Physical Exam Vitals and nursing note reviewed.  Constitutional:      General: She is not in acute distress.    Appearance: She is well-developed.  HENT:     Head: Normocephalic and atraumatic.  Eyes:     Conjunctiva/sclera: Conjunctivae normal.  Cardiovascular:     Rate and Rhythm: Normal rate.  Pulmonary:     Effort: Pulmonary effort is normal.  Musculoskeletal:        General: Normal range of motion.     Cervical back: Neck supple.     Right lower leg: Edema present.     Left lower leg: No edema.     Comments: Calf TTP to the rle noted. DP pulse intact.   Skin:    General: Skin is warm and dry.  Neurological:     Mental Status: She is alert.    ED Results / Procedures / Treatments   Labs (all labs ordered are listed, but only  abnormal results are displayed) Labs Reviewed  CBG MONITORING, ED    EKG None  Radiology No results found.  Procedures Procedures   Medications Ordered in ED Medications  enoxaparin (LOVENOX) injection 67.5 mg (67.5 mg Subcutaneous Given 01/13/21 1529)    ED Course  I have reviewed the triage vital signs and the nursing notes.  Pertinent labs & imaging results that were available during my care of the patient were reviewed by me and considered in my medical decision making (see chart for details).    MDM Rules/Calculators/A&P                          65 y/o F presenting for eval of RLE swelling that started 3 weeks ago. She further requests that we test her blood sugar.   CBG was.  I did advise if she wants formal testing for diabetes she needs to  Follow-up with her PCP she understands this.  Initially a right lower extremity ultrasound was ordered however patient stated that she no longer wanted to wait for the study so this was ordered as an outpatient she was given a dose of Lovenox here in the ED.  She is advised return to the ED for any new or worsening symptoms in the meantime.  She voiced understanding of plan and reasons to return. all Questions answered.  Patient stable for discharge.  Final Clinical Impression(s) / ED Diagnoses Final diagnoses:  Leg swelling    Rx / DC Orders ED Discharge Orders          Ordered    US Venous Img Lower Unilateral Right        01/13/21 1519  Rodney Booze, PA-C [id number]    Lianne Cure, DO [phone] [phone]

## 2021-01-13 NOTE — Discharge Instructions (Addendum)
IMPORTANT PATIENT INSTRUCTIONS:  Your ED provider has recommended an Outpatient Ultrasound.  Please call [phone] to schedule an appointment.  If your appointment is scheduled for a Saturday, Sunday or holiday, please go to the [facility] Emergency Department Registration Desk at least 15 minutes prior to your appointment time and tell them you are there for an ultrasound.    If your appointment is scheduled for a weekday (Monday-Friday), please go directly to the [facility] Radiology Department at least 15 minutes prior to your appointment time and tell them you are there for an ultrasound.  Please call [phone] with questions.   --------------------------------  Please follow up with your primary care provider within 5-7 days for re-evaluation of your symptoms. If you do not have a primary care provider, information for a healthcare clinic has been provided for you to make arrangements for follow up care. Please return to the emergency department for any new or worsening symptoms.

## 2021-01-15 ENCOUNTER — Other Ambulatory Visit: Payer: Self-pay

## 2021-01-15 ENCOUNTER — Ambulatory Visit (HOSPITAL_COMMUNITY)
Admission: RE | Admit: 2021-01-15 | Discharge: 2021-01-15 | Disposition: A | Discharge status: Home / Self Care | Payer: Medicare Other | Source: Ambulatory Visit | Attending: Physician Assistant | Admitting: Physician Assistant

## 2021-01-15 DIAGNOSIS — R600 Localized edema: Secondary | ICD-10-CM | POA: Diagnosis present

## 2021-01-15 IMAGING — US US EXTREM LOW VENOUS*R*
1 series · 13 of 24 positions shown · non-contrast
Comparison: Right lower extremity venous Doppler
ultrasound-[DATE]

CLINICAL DATA: Right lower extremity pain and edema for the past 3
weeks. History of multiple falls. Evaluate for DVT.



[Series 1: us venous img lower uni right (dvt) · portal-venous · 51 acquisitions, 13 frames shown]
[im 1/51]
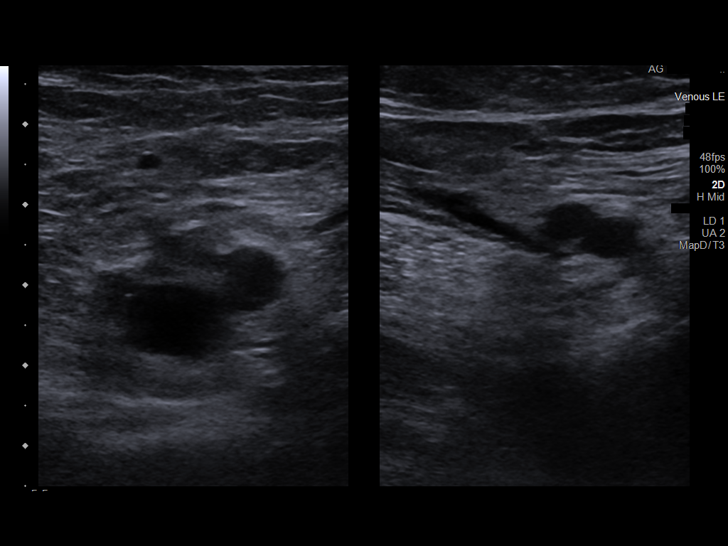
[im 5/51]
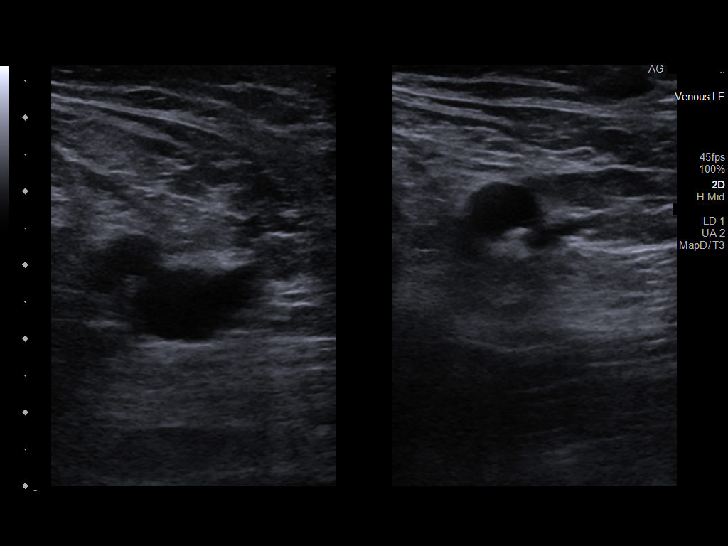
[im 9/51]
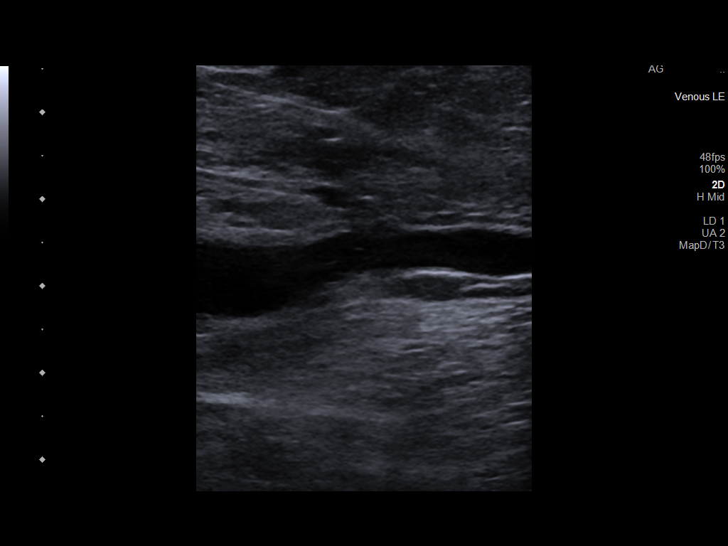
[im 14/51]
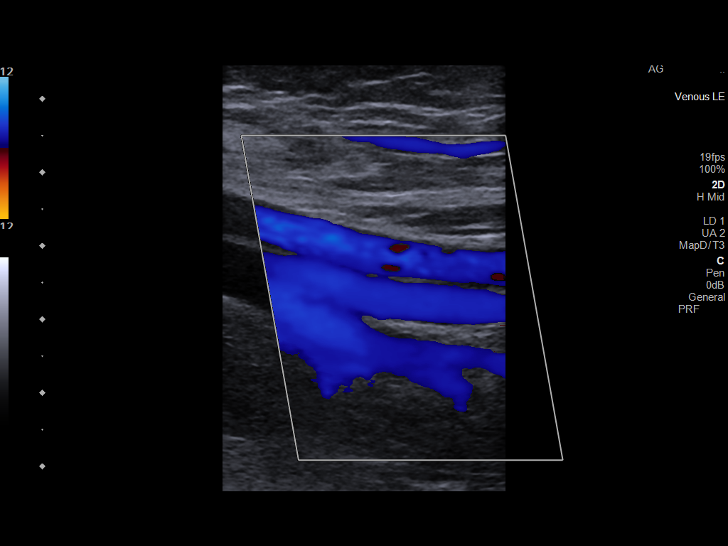
[im 18/51]
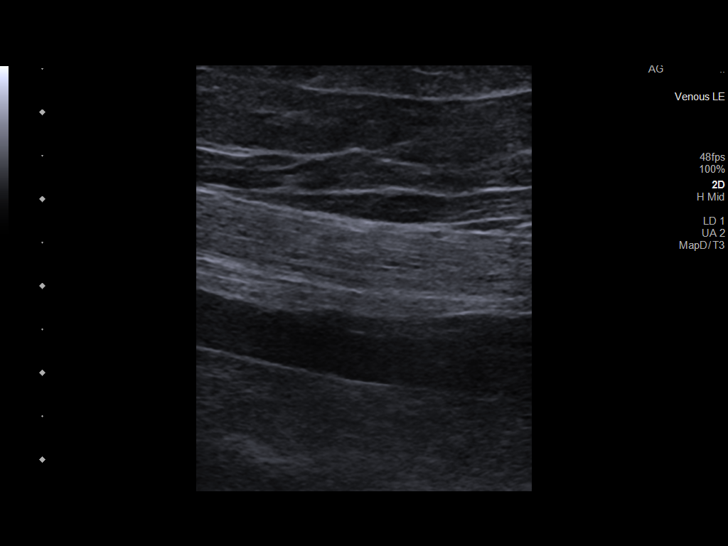
[im 22/51]
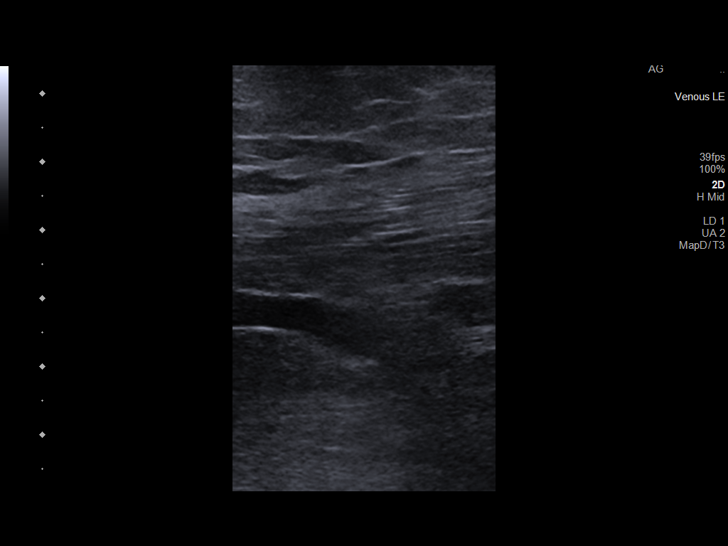
[im 29/51]
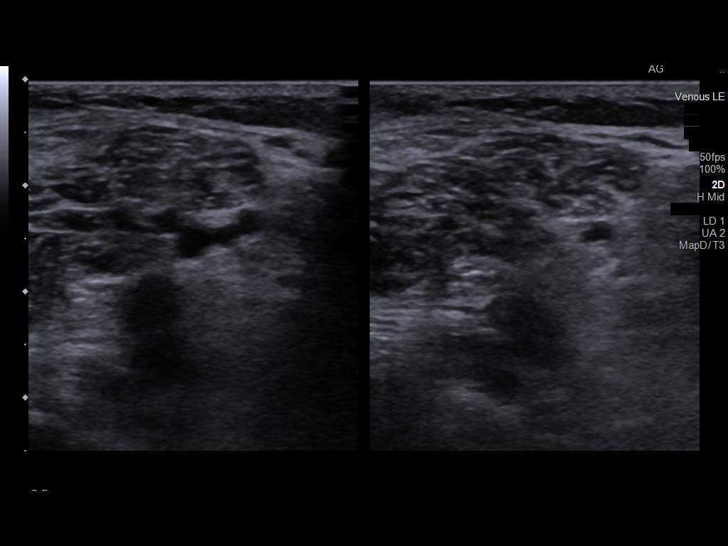
[im 31/51]
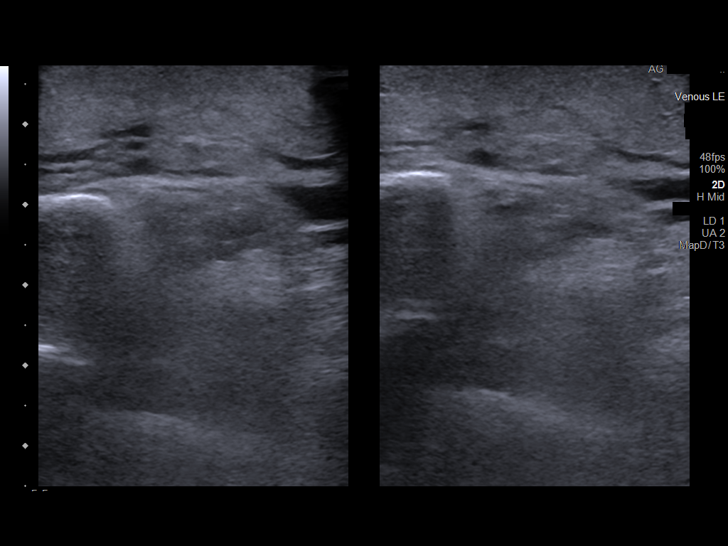
[im 35/51]
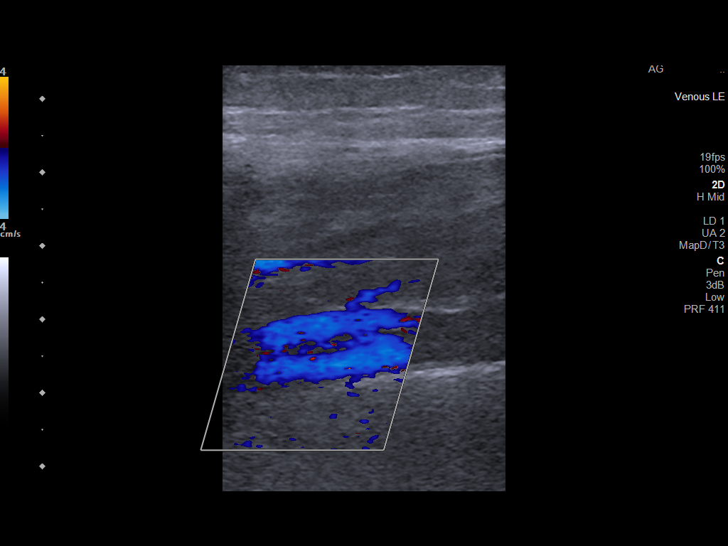
[im 40/51]
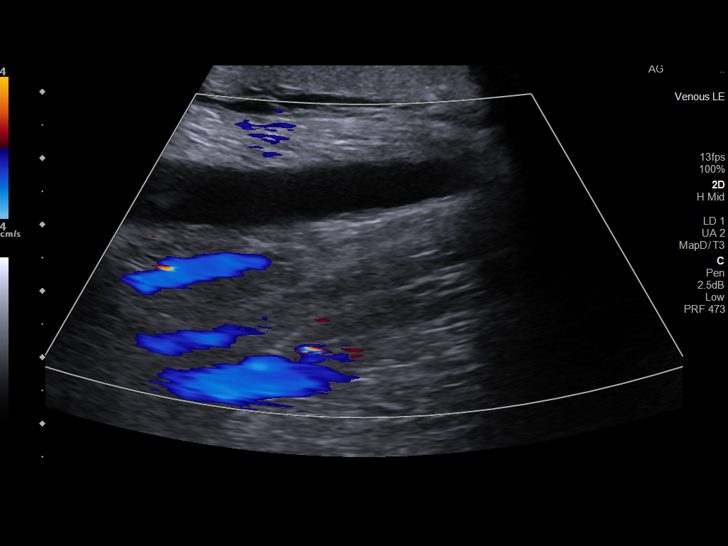
[im 44/51]
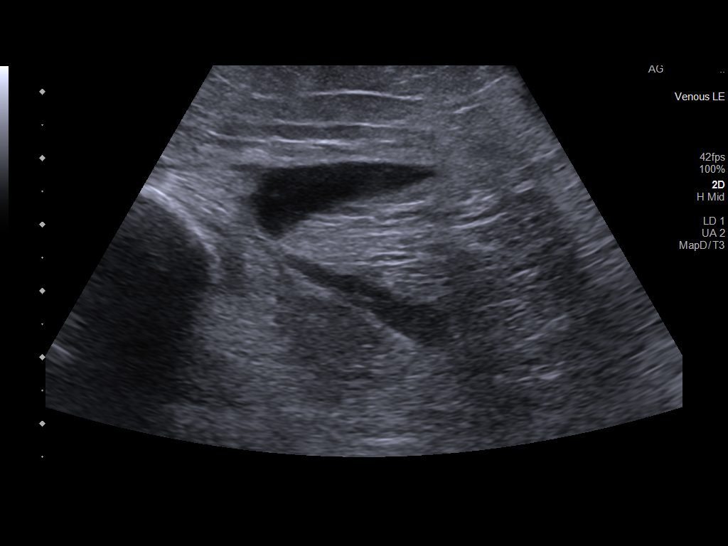
[im 46/51]
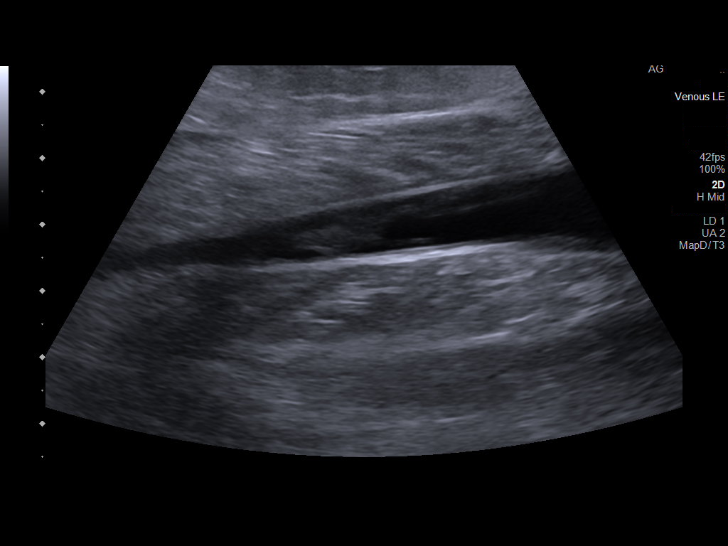
[im 51/51]
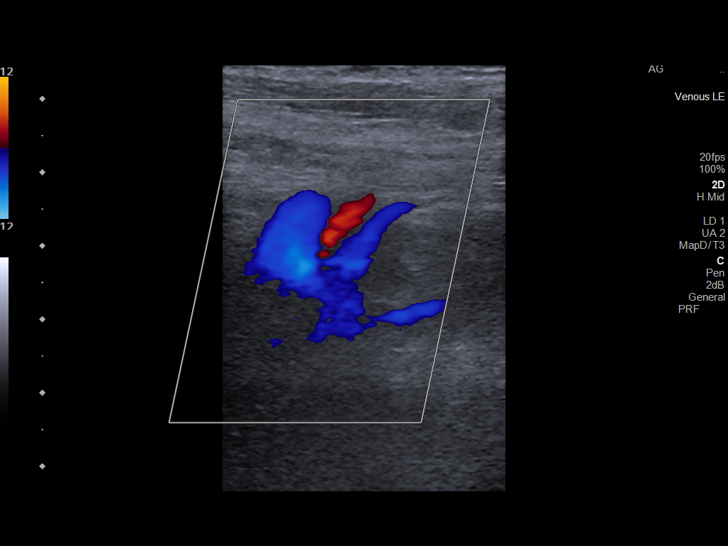

[13 of 24 positions shown; findings below may reference images not displayed]

FINDINGS: Contralateral Common Femoral Vein: Respiratory phasicity is normal
and symmetric with the symptomatic side. No evidence of thrombus.
Normal compressibility.

Common Femoral Vein: No evidence of thrombus. Normal
compressibility, respiratory phasicity and response to augmentation.

Saphenofemoral Junction: No evidence of thrombus. Normal
compressibility and flow on color Doppler imaging.

Profunda Femoral Vein: No evidence of thrombus. Normal
compressibility and flow on color Doppler imaging.

Femoral Vein: No evidence of thrombus. Normal compressibility,
respiratory phasicity and response to augmentation.

Popliteal Vein: No evidence of thrombus. Normal compressibility,
respiratory phasicity and response to augmentation.

Calf Veins: No evidence of thrombus. Normal compressibility and flow
on color Doppler imaging.

Superficial Great Saphenous Vein: No evidence of thrombus. Normal
compressibility.

Venous Reflux:  None.

Other Findings: Note is made an approximately 24.4 x 1.1 x 2.3 cm
minimally complex serpiginous fluid collection with the subcutaneous
tissues of the medial aspect of the right calf.
IMPRESSION: 1. No evidence of DVT within the right lower extremity.
2. Note made of a serpiginous thin minimally complex proximally
cm collection within the subcutaneous tissues of the medial aspect
of the right calf, not seen recent right lower extremity venous
Doppler ultrasound performed [DATE], and while nonspecific,
given history recent falls, may represent a hematoma. Clinical
correlation is advised.

## 2021-01-15 NOTE — ED Provider Notes (Signed)
Patient's DVT u/s shows no DVT.  There is a possible hematoma in the subcutaneous tissues.  There is no obvious cellulitis when I examined her briefly in the waiting room.  Overall discussed supportive care measures such as compression and ice.  Follow-up with her doctor.   Sherwood Gambler, MD 01/15/21 [phone]

## 2021-02-06 ENCOUNTER — Other Ambulatory Visit: Payer: Self-pay

## 2021-02-06 ENCOUNTER — Encounter: Payer: Self-pay | Admitting: Physician Assistant

## 2021-02-06 ENCOUNTER — Ambulatory Visit (HOSPITAL_COMMUNITY)
Admission: RE | Admit: 2021-02-06 | Discharge: 2021-02-06 | Disposition: A | Discharge status: Home / Self Care | Payer: Medicare Other | Source: Ambulatory Visit | Attending: Physician Assistant | Admitting: Physician Assistant

## 2021-02-06 ENCOUNTER — Other Ambulatory Visit: Payer: Self-pay | Admitting: Physician Assistant

## 2021-02-06 ENCOUNTER — Ambulatory Visit (INDEPENDENT_AMBULATORY_CARE_PROVIDER_SITE_OTHER): Payer: Medicare Other | Admitting: Physician Assistant

## 2021-02-06 ENCOUNTER — Ambulatory Visit: Payer: Self-pay

## 2021-02-06 ENCOUNTER — Ambulatory Visit (INDEPENDENT_AMBULATORY_CARE_PROVIDER_SITE_OTHER): Payer: Medicare Other | Admitting: Family Medicine

## 2021-02-06 VITALS — BP 134/80 | HR 93 | Temp 97.7°F | Ht 59.0 in | Wt 147.4 lb

## 2021-02-06 VITALS — BP 134/80 | HR 93 | Ht 59.0 in | Wt 147.0 lb

## 2021-02-06 DIAGNOSIS — M1990 Unspecified osteoarthritis, unspecified site: Secondary | ICD-10-CM

## 2021-02-06 DIAGNOSIS — S86111A Strain of other muscle(s) and tendon(s) of posterior muscle group at lower leg level, right leg, initial encounter: Secondary | ICD-10-CM | POA: Insufficient documentation

## 2021-02-06 DIAGNOSIS — G459 Transient cerebral ischemic attack, unspecified: Secondary | ICD-10-CM | POA: Diagnosis present

## 2021-02-06 DIAGNOSIS — M79604 Pain in right leg: Secondary | ICD-10-CM

## 2021-02-06 DIAGNOSIS — T792XXA Traumatic secondary and recurrent hemorrhage and seroma, initial encounter: Secondary | ICD-10-CM | POA: Insufficient documentation

## 2021-02-06 DIAGNOSIS — R251 Tremor, unspecified: Secondary | ICD-10-CM | POA: Diagnosis not present

## 2021-02-06 DIAGNOSIS — J449 Chronic obstructive pulmonary disease, unspecified: Secondary | ICD-10-CM

## 2021-02-06 DIAGNOSIS — M7989 Other specified soft tissue disorders: Secondary | ICD-10-CM

## 2021-02-06 DIAGNOSIS — R413 Other amnesia: Secondary | ICD-10-CM

## 2021-02-06 DIAGNOSIS — Z0001 Encounter for general adult medical examination with abnormal findings: Secondary | ICD-10-CM | POA: Diagnosis not present

## 2021-02-06 DIAGNOSIS — Z23 Encounter for immunization: Secondary | ICD-10-CM | POA: Diagnosis not present

## 2021-02-06 DIAGNOSIS — Z1231 Encounter for screening mammogram for malignant neoplasm of breast: Secondary | ICD-10-CM

## 2021-02-06 LAB — CBC WITH DIFFERENTIAL/PLATELET
Basophils Absolute: 0 10*3/uL (ref 0.0–0.1)
Basophils Relative: 0.4 % (ref 0.0–3.0)
Eosinophils Absolute: 0.1 10*3/uL (ref 0.0–0.7)
Eosinophils Relative: 1 % (ref 0.0–5.0)
HCT: 39.2 % (ref 36.0–46.0)
Hemoglobin: 12.7 g/dL (ref 12.0–15.0)
Lymphocytes Relative: 24.4 % (ref 12.0–46.0)
Lymphs Abs: 2.2 10*3/uL (ref 0.7–4.0)
MCHC: 32.4 g/dL (ref 30.0–36.0)
MCV: 90.1 fl (ref 78.0–100.0)
Monocytes Absolute: 0.7 10*3/uL (ref 0.1–1.0)
Monocytes Relative: 7.9 % (ref 3.0–12.0)
Neutro Abs: 5.9 10*3/uL (ref 1.4–7.7)
Neutrophils Relative %: 66.3 % (ref 43.0–77.0)
Platelets: 452 10*3/uL — ABNORMAL HIGH (ref 150.0–400.0)
RBC: 4.35 Mil/uL (ref 3.87–5.11)
RDW: 15.3 % (ref 11.5–15.5)
WBC: 8.9 10*3/uL (ref 4.0–10.5)

## 2021-02-06 LAB — COMPREHENSIVE METABOLIC PANEL
ALT: 9 U/L (ref 0–35)
AST: 18 U/L (ref 0–37)
Albumin: 3.9 g/dL (ref 3.5–5.2)
Alkaline Phosphatase: 108 U/L (ref 39–117)
BUN: 6 mg/dL (ref 6–23)
CO2: 31 mEq/L (ref 19–32)
Calcium: 9.4 mg/dL (ref 8.4–10.5)
Chloride: 102 mEq/L (ref 96–112)
Creatinine, Ser: 0.65 mg/dL (ref 0.40–1.20)
GFR: 91.85 mL/min (ref >60.00)
Glucose, Bld: 96 mg/dL (ref 70–99)
Potassium: 4.2 mEq/L (ref 3.5–5.1)
Sodium: 141 mEq/L (ref 135–145)
Total Bilirubin: 0.5 mg/dL (ref 0.2–1.2)
Total Protein: 7 g/dL (ref 6.0–8.3)

## 2021-02-06 LAB — VITAMIN B12: Vitamin B-12: 473 pg/mL (ref 211–911)

## 2021-02-06 LAB — LIPID PANEL
Cholesterol: 179 mg/dL (ref 0–200)
HDL: 60.4 mg/dL (ref >39.00)
LDL Cholesterol: 102 mg/dL — ABNORMAL HIGH (ref 0–99)
NonHDL: 118.74
Total CHOL/HDL Ratio: 3
Triglycerides: 86 mg/dL (ref 0.0–149.0)
VLDL: 17.2 mg/dL (ref 0.0–40.0)

## 2021-02-06 LAB — HEMOGLOBIN A1C: Hgb A1c MFr Bld: 6.2 % (ref 4.6–6.5)

## 2021-02-06 IMAGING — MR MR HEAD W/O CM
6 of 11 series · 24 of 48 positions shown · non-contrast
Comparison: Head CT [DATE]

CLINICAL DATA: Neurological deficit, acute, stroke suspected.  TIA.

EXAM:
MRI HEAD WITHOUT CONTRAST
TECHNIQUE: Multiplanar, multiecho pulse sequences of the brain and surrounding
structures were obtained without intravenous contrast.

[Series 2: DWI · axial · 3.0mm · 0.94mm/px · z∈[-62,+87]mm · 7 of 102 slices shown (1 of 2)]
[im 1/102]
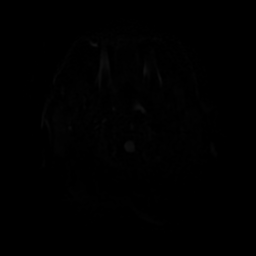
[im 17/102]
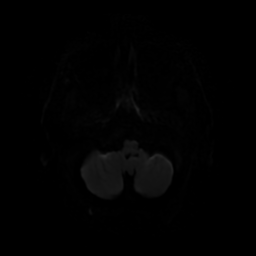
[im 34/102]
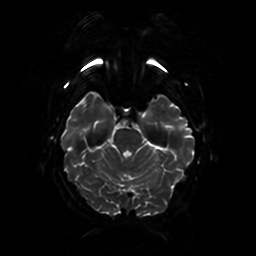
[im 51/102]
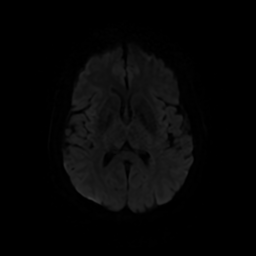
[im 68/102]
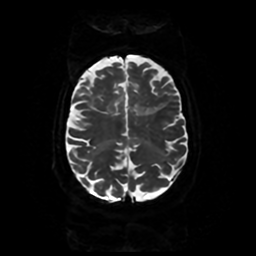
[im 85/102]
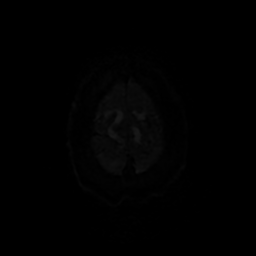
[im 102/102]
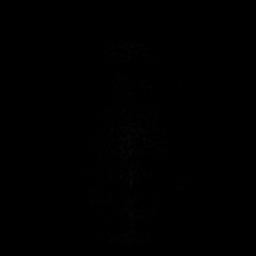

[Series 3: DWI · coronal · 4.0mm · 0.94mm/px · 5 of 74 slices shown (2 of 2)]
[im 1/74]
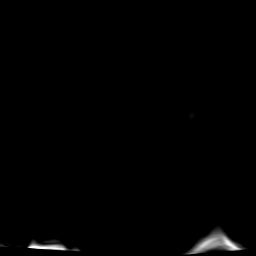
[im 19/74]
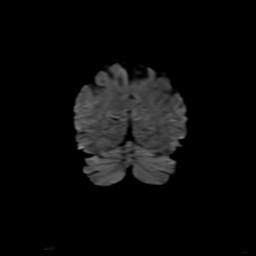
[im 37/74]
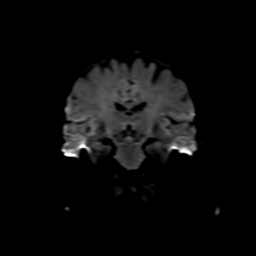
[im 55/74]
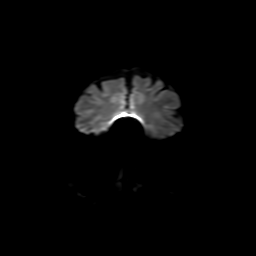
[im 74/74]
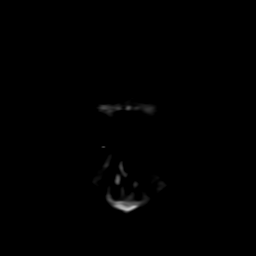

[Series 6: FLAIR · sagittal · 5.0mm · 0.23mm/px · 2 of 26 slices shown (1 of 2)]
[im 1/26]
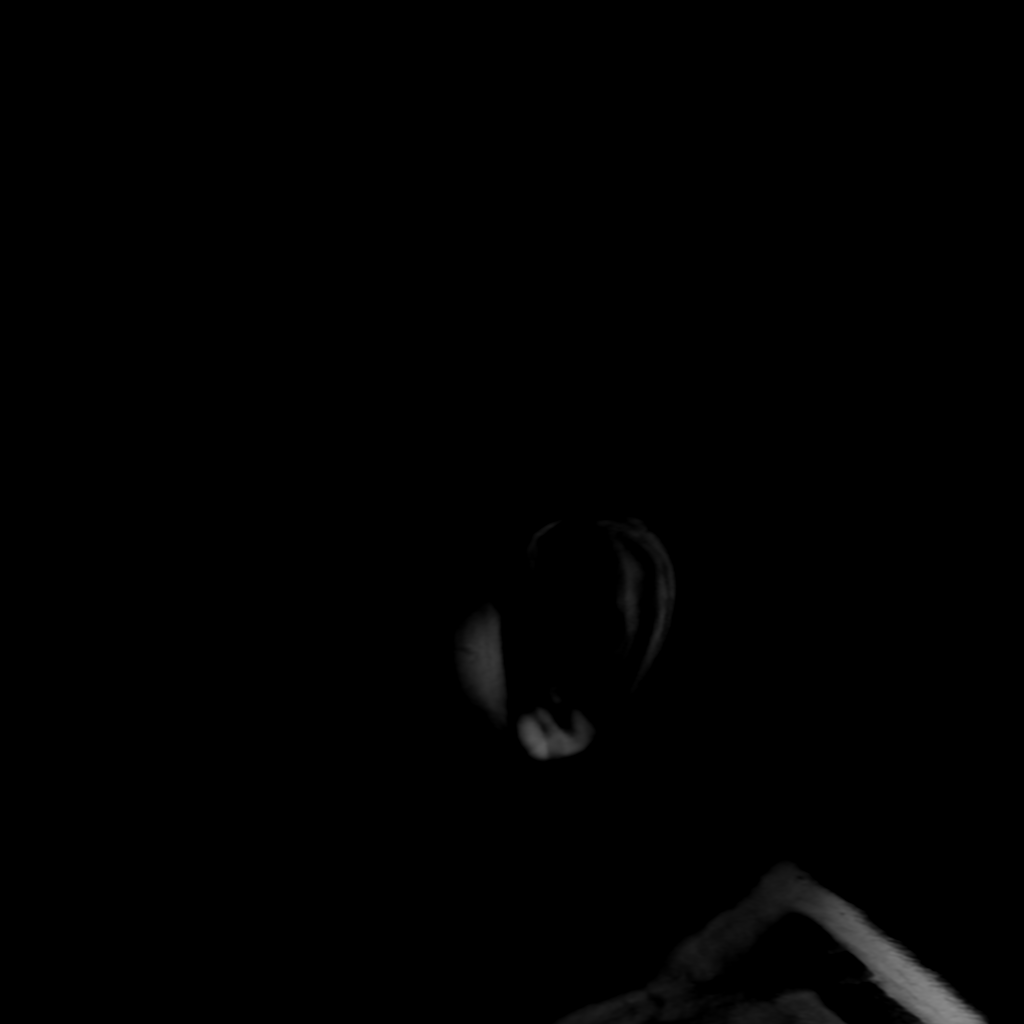
[im 26/26]
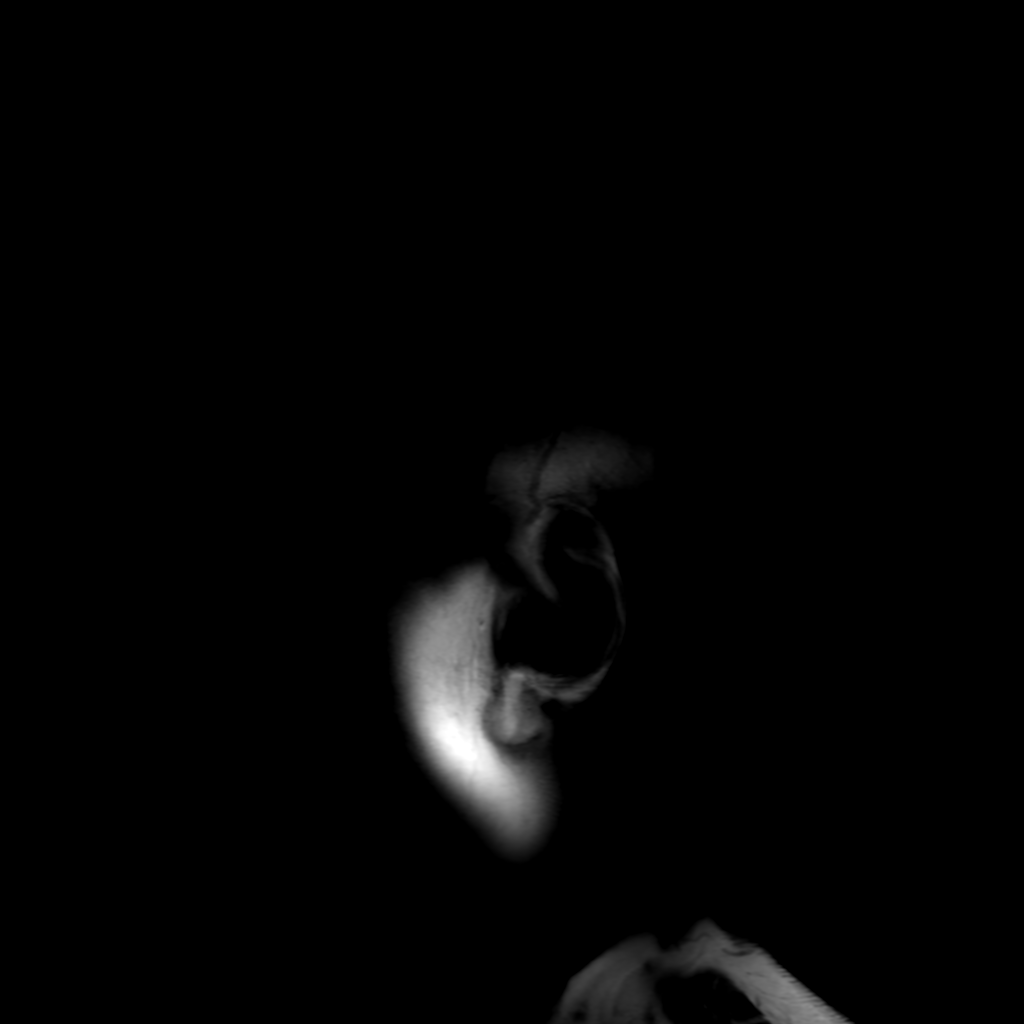

[Series 8: FLAIR · axial · 4.0mm · 0.45mm/px · z∈[-65,+88]mm · 3 of 36 slices shown (2 of 2)]
[im 1/36]
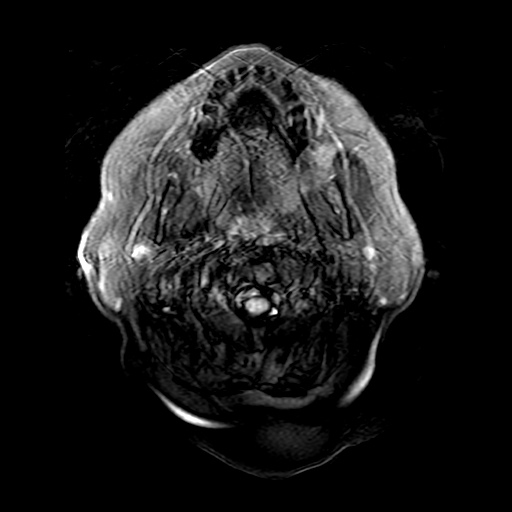
[im 18/36]
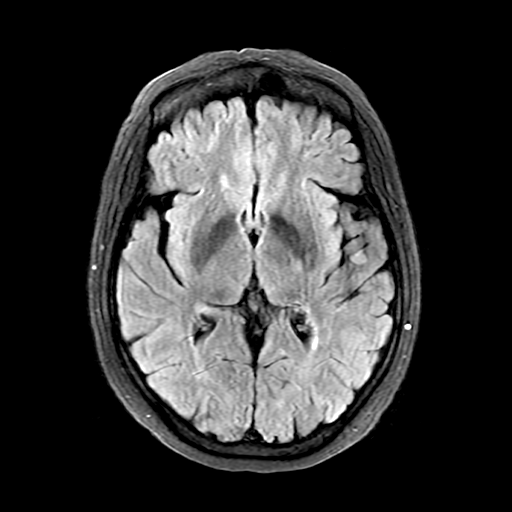
[im 36/36]
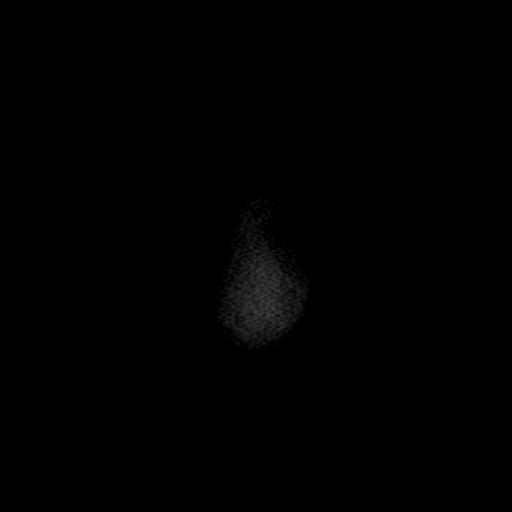

[Series 250: ADC · axial · 3.0mm · 0.94mm/px · z∈[-62,+87]mm · 4 of 51 slices shown (1 of 2)]
[im 1/51]
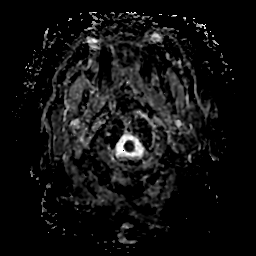
[im 17/51]
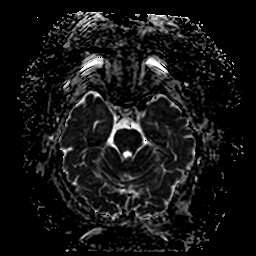
[im 34/51]
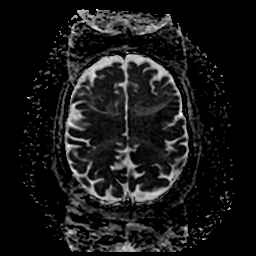
[im 51/51]
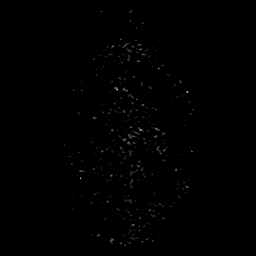

[Series 350: ADC · coronal · 4.0mm · 0.94mm/px · 3 of 36 slices shown (2 of 2)]
[im 1/36]
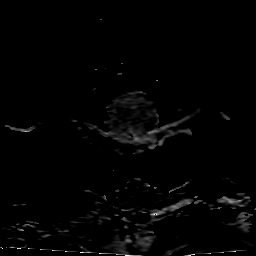
[im 18/36]
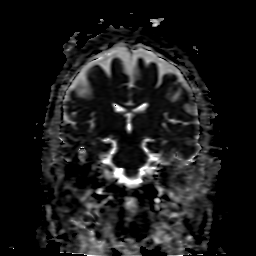
[im 36/36]
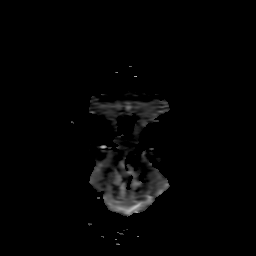

[24 of 48 positions shown; findings below may reference images not displayed]

FINDINGS: Brain: The study suffers from motion degradation. Diffusion imaging
does not show any acute or subacute infarction. No abnormality is
seen affecting the brainstem or cerebellum. Cerebral hemispheres
show minimal small vessel change of the white matter. No cortical or
large vessel territory infarction. No mass lesion, hemorrhage,
hydrocephalus or extra-axial collection.

Vascular: Major vessels at the base of the brain show flow.

Skull and upper cervical spine: Negative

Sinuses/Orbits: Clear/normal

Other: None
IMPRESSION: Motion degraded study. No acute finding. Mild chronic small-vessel
ischemic change of the cerebral hemispheric white matter.

## 2021-02-06 MED ORDER — PREDNISONE 20 MG PO TABS: 40.0000 mg | ORAL_TABLET | Freq: Every day | ORAL | 0 refills | Status: DC

## 2021-02-06 MED ORDER — ALBUTEROL SULFATE (2.5 MG/3ML) 0.083% IN NEBU: 2.5000 mg | INHALATION_SOLUTION | Freq: Four times a day (QID) | RESPIRATORY_TRACT | 1 refills | Status: DC | PRN

## 2021-02-06 NOTE — Progress Notes (Signed)
   I, Holly Phillips, LAT, ATC, am serving as scribe for Dr. Lynne Phillips.  Subjective:    CC: R leg pain/swelling:  HPI: Pt is a 65 y/o female c/o R leg pain/swelling  after suffering a fall on 12/24/20 when she fell after getting off the toilet.  She was seen at the [facility] ED on 01/13/21 w/ c/o con't R lower leg pain and swelling.  She had a R LE venous doppler US on 01/04/21 and 01/15/21.  The Doppler showed hematoma or seroma posterior calf.  Pt locates pain to her R post-medial calf.  R LE numbness/tingling: intermittently yes in her R foot and lower leg Aggravating factors: at rest Treatments tried: compression; ice; antibiotics  Pertinent review of Systems: No fevers or chills  Relevant historical information: COPD   Objective:    Vitals:   02/06/21 0944  BP: 134/80  Pulse: 93  SpO2: 96%   General: Well Developed, well nourished, and in no acute distress.   MSK: Right calf slightly swollen.  Tender palpation medial and lateral gastrocnemius and posterior knee. Normal foot and ankle motion. Pain with resisted ankle plantarflexion  Lab and Radiology Results  Diagnostic Limited MSK Ultrasound of: Right calf Hypoechoic structure extending from the proximal medial gastrocnemius head to the distal muscle tendinous junction deep to the gastrocnemius linear approximately 1 cm in depth.  This is consistent with a seroma.  Overall structure is smaller in size than described on the vascular ultrasound report from August 30. Gastrocnemius muscle is intact appearing otherwise. Impression: Evidence of prior gastrocnemius muscle strain tear with now resulting seroma.     Impression and Recommendations:    Assessment and Plan: 65 y.o. female with right gastrocnemius muscle strain or tear with resulting persistent seroma.  However the seroma appears to be improving with compression.  Plan to maximize conservative management strategies including body helix compression sleeve, and  eccentric exercises home exercise program taught in clinic today by ATC.  Reassess in 1 month. Holly Phillips has excellent prognosis for full recovery without needing intervention such as aspiration.  PDMP not reviewed this encounter. Orders Placed This Encounter  Procedures   [place] LIMITED JOINT SPACE STRUCTURES LOW RIGHT(NO LINKED CHARGES)    Order Specific Question:   Reason for Exam (SYMPTOM  OR DIAGNOSIS REQUIRED)    Answer:   R lower leg pain    Order Specific Question:   Preferred imaging location?    Answer:   [place]   No orders of the defined types were placed in this encounter.   Discussed warning signs or symptoms. Please see discharge instructions. Patient expresses understanding.   The above documentation has been reviewed and is accurate and complete Holly Phillips, M.D.

## 2021-02-06 NOTE — Patient Instructions (Signed)
Good to see you today.  I recommend you obtained a compression sleeve to help with your joint problems. There are many options on the market however I recommend obtaining a calf Body Helix compression sleeve.  You can find information (including how to appropriate measure yourself for sizing) can be found at www.Body http://www.lambert.com/.  Many of these products are health savings account (HSA) eligible.   You can use the compression sleeve at any time throughout the day but is most important to use while being active as well as for 2 hours post-activity.   It is appropriate to ice following activity with the compression sleeve in place.   Please perform the exercise program that we have prepared for you and gone over in detail on a daily basis.  In addition to the handout you were provided you can access your program through: [email or web address]-exercise-code.com   Your unique program code is:  [email or web address]  Follow-up in 4 weeks.

## 2021-02-06 NOTE — Patient Instructions (Addendum)
It was great to see you!  For your brain: -MRI asap -Carotid ultrasound -Heart monitor -Update blood work -Neurology referral  For your breathing: -Start oral prednisone -If you get covid again, call me  -Use the nebulizer solution as needed for your breathing -Call pulmonary if you get worse  For your leg: -See Dr. Georgina Snell as soon as you leave here His location: [place] at [facility] [address] on the 1st floor.   Phone number [phone], Fax [phone].  This location is across the street from the entrance to [employer] and in the same complex as the [facility] and [facility]  Please go to the lab for blood work.   Our office will call you with your results unless you have chosen to receive results via MyChart.  If your blood work is normal we will follow-up each year for physicals and as scheduled for chronic medical problems.  If anything is abnormal we will treat accordingly and get you in for a follow-up.  Take care,  Aldona Bar

## 2021-02-06 NOTE — Progress Notes (Signed)
I acted as a Education administrator for [employer], PA-C [employer], LPN    Subjective:    Holly Phillips is a 65 y.o. female and is here for a comprehensive physical exam.   HPI  Health Maintenance Due  Topic Date Due   MAMMOGRAM  Never done   Hepatitis C Screening  Never done   COLONOSCOPY (Pts 45-49yrs Insurance coverage will need to be confirmed)  Never done   DEXA SCAN  Never done   INFLUENZA VACCINE  Never done    Acute Concerns: Tremors; memory loss; recent TIA-- Pt c/o tremors upper body x 4 months, worse the past month.  Patient reports that her tremors usually occur in the morning, about 3 times per week.  Tremors are sometimes so bad that if she is holding a cup of coffee will completely spell.  She denies any balance issues.  She did recently have an episode of slurred speech that was evident to her cardiologist on her 01/04/2021 visit, that prompted him to order a stat head CT.  This head CT was negative.  She is having significant memory loss as well.  Her last echo was done with cardiology in May 2021.  Right leg swelling --this started at the beginning of August, while she was in [place].  She had a blister on the right foot and an abrasion to her shin and was seen at an urgent care there.  She was on antibiotics.  She has had persistent swelling since that time.  She had a negative Doppler performed by cardiology.  She was recommended to use compression.  She states that she has used compression but her leg still remains quite swollen.  Questionable hematoma per ultrasound findings.  Patient reports that she continues to have swelling and pain in the right leg.  Chronic Issues: COPD -- currently taking symbicort 2 puffs BID; uses albuterol at least twice daily.  She does see pulmonology however she has not been recently.  She states that they were questioning putting her on home oxygen.  She states that she had COVID 3 weeks ago.  Arthritis --she is seeing pain  management.  She is on multiple medications under their guidance.  Health Maintenance: Immunizations -- will give Flu shot today Colonoscopy -- overdue Mammogram -- due PAP -- N/A Bone Density -- due Diet -- drinks water during the day; goes all day without  Sleep habits -- not sleeping well Exercise -- very little Weight -- Weight: 147 lb 6.1 oz (66.9 kg)  Mood -- overall stable per patient Weight history: Wt Readings from Last 10 Encounters:  02/06/21 147 lb 6.1 oz (66.9 kg)  01/13/21 148 lb (67.1 kg)  01/04/21 148 lb (67.1 kg)  08/13/20 144 lb (65.3 kg)  07/13/20 140 lb (63.5 kg)  07/04/20 148 lb (67.1 kg)  06/29/20 154 lb 12.2 oz (70.2 kg)  12/27/19 131 lb (59.4 kg)  12/16/19 128 lb (58.1 kg)  09/29/19 129 lb (58.5 kg)   Body mass index is 29.77 kg/m. No LMP recorded (lmp unknown). Patient has had a hysterectomy. Alcohol use:  reports that she does not currently use alcohol. Tobacco use:  Tobacco Use: Medium Risk   Smoking Tobacco Use: Former   Smokeless Tobacco Use: Never     Depression screen PHQ 2/9 02/06/2021  Decreased Interest 1  Down, Depressed, Hopeless 1  PHQ - 2 Score 2  Altered sleeping 3  Tired, decreased energy 3  Change in appetite 1  Feeling bad  or failure about yourself  0  Trouble concentrating 3  Moving slowly or fidgety/restless 0  Suicidal thoughts 0  PHQ-9 Score 12  Difficult doing work/chores -     Other providers/specialists: Patient Care Team: Inda Coke, [place] as PCP - General (Physician Assistant)    PMHx, SurgHx, SocialHx, Medications, and Allergies were reviewed in the Visit Navigator and updated as appropriate.   Past Medical History:  Diagnosis Date   Anxiety    Arthritis    Asthma    Chronic back pain    Colon polyps    COPD (chronic obstructive pulmonary disease) ([place])    Depression    Emphysema of lung ([place])      Past Surgical History:  Procedure Laterality Date   ABDOMINAL HYSTERECTOMY     CERVICAL  SPINE SURGERY     INNER EAR SURGERY     LUMBAR FUSION  2012   L3-L7     Family History  Problem Relation Age of Onset   Depression Mother    Diabetes Mother    Hypertension Mother    Hyperlipidemia Mother    Heart attack Mother    Osteoarthritis Father    Asthma Father    COPD Father    Breast cancer Sister    Heart attack Maternal Grandmother    Lupus Sister    Osteoarthritis Sister    Asthma Sister    COPD Sister    Diabetes Sister    Drug abuse Sister    Heart attack Sister    Colon cancer Neg Hx    Esophageal cancer Neg Hx     Social History   Tobacco Use   Smoking status: Former    Packs/day: 0.50    Types: Cigarettes    Quit date: 07/17/2020    Years since quitting: 0.5   Smokeless tobacco: Never   Tobacco comments:    Not smoking cigarettes but is vaping  08/13/2020  Vaping Use   Vaping Use: Every day   Substances: Nicotine, Flavoring  Substance Use Topics   Alcohol use: Not Currently   Drug use: Not Currently    Types: Other-see comments    Comment: CBD and medical marijuana- denies 01/13/21    Review of Systems:   Review of Systems  Constitutional:  Negative for chills, fever, malaise/fatigue and weight loss.  HENT:  Negative for hearing loss, sinus pain and sore throat.   Respiratory:  Negative for cough and hemoptysis.   Cardiovascular:  Negative for chest pain, palpitations, leg swelling and PND.  Gastrointestinal:  Negative for abdominal pain, constipation, diarrhea, heartburn, nausea and vomiting.  Genitourinary:  Negative for dysuria, frequency and urgency.  Musculoskeletal:  Negative for back pain, myalgias and neck pain.  Skin:  Negative for itching and rash.  Neurological:  Negative for dizziness, tingling, seizures and headaches.  Endo/Heme/Allergies:  Negative for polydipsia.  Psychiatric/Behavioral:  Negative for depression. The patient is not nervous/anxious.    Objective:   BP 134/80 (BP Location: Left Arm, Patient Position:  Sitting, Cuff Size: Normal)   Pulse 93   Temp 97.7 F (36.5 C) (Temporal)   Ht 4\' 11"  (7.408 m)   Wt 147 lb 6.1 oz (66.9 kg)   LMP  (LMP Unknown)   SpO2 96%   BMI 29.77 kg/m  Body mass index is 29.77 kg/m.   General Appearance:    Alert, cooperative, no distress, appears stated age  Head:    Normocephalic, without obvious abnormality, atraumatic  Eyes:  PERRL, conjunctiva/corneas clear, EOM's intact, fundi    benign, both eyes  Ears:    Normal TM's and external ear canals, both ears  Nose:   Nares normal, septum midline, mucosa normal, no drainage    or sinus tenderness  Throat:   Lips, mucosa, and tongue normal; teeth and gums normal  Neck:   Supple, symmetrical, trachea midline, no adenopathy;    thyroid:  no enlargement/tenderness/nodules; no carotid   bruit or JVD  Back:     Symmetric, no curvature, ROM normal, no CVA tenderness  Lungs:     Clear to auscultation bilaterally, respirations unlabored  Chest Wall:    No tenderness or deformity   Heart:    Regular rate and rhythm, S1 and S2 normal, no murmur, rub or gallop  Breast Exam:    Deferred  Abdomen:     Soft, non-tender, bowel sounds active all four quadrants,    no masses, no organomegaly  Genitalia:    Deferred  Extremities:   Left extremities normal, atraumatic, no cyanosis Right lower extremity with diffuse swelling, no erythema  Pulses:   2+ and symmetric all extremities  Skin:   Skin color, texture, turgor normal, no rashes or lesions  Lymph nodes:   Cervical, supraclavicular, and axillary nodes normal  Neurologic:   CNII-XII intact, normal strength, sensation and reflexes    throughout    Assessment/Plan:   Encounter for general adult medical examination with abnormal findings Today patient counseled on age appropriate routine health concerns for screening and prevention, each reviewed and up to date or declined. Immunizations reviewed and up to date or declined. Labs ordered and reviewed. Risk factors for  depression reviewed and negative. Hearing function and visual acuity are intact. ADLs screened and addressed as needed. Functional ability and level of safety reviewed and appropriate. Education, counseling and referrals performed based on assessed risks today. Patient provided with a copy of personalized plan for preventive services.  TIA (transient ischemic attack); Memory loss; Tremors of nervous system Concern for TIA and or underlying worsening neurological issues Question if polypharmacy is contributing to her symptoms We are going to order an MRI, carotid ultrasound, Holter monitor Update blood work today including B12, lipid panel, A1c, CBC and CMP  Right leg swelling Luckily we were able to secure appointment with Dr. Lynne Leader today for further evaluation Will defer to his team for further management  Chronic obstructive pulmonary disease, unspecified COPD type ([place]) Overall uncontrolled I am giving her nebulizer solution to use in her nebulizer I recommend close follow-up with pulmonology   Arthritis Management per pain specialists  Patient Counseling: [x]    Nutrition: Stressed importance of moderation in sodium/caffeine intake, saturated fat and cholesterol, caloric balance, sufficient intake of fresh fruits, vegetables, fiber, calcium, iron, and 1 mg of folate supplement per day (for females capable of pregnancy).  [x]    Stressed the importance of regular exercise.   [x]    Substance Abuse: Discussed cessation/primary prevention of tobacco, alcohol, or other drug use; driving or other dangerous activities under the influence; availability of treatment for abuse.   [x]    Injury prevention: Discussed safety belts, safety helmets, smoke detector, smoking near bedding or upholstery.   [x]    Sexuality: Discussed sexually transmitted diseases, partner selection, use of condoms, avoidance of unintended pregnancy  and contraceptive alternatives.  [x]    Dental health: Discussed  importance of regular tooth brushing, flossing, and dental visits.  [x]    Health maintenance and immunizations reviewed. Please refer to  Health maintenance section.   CMA or LPN served as scribe during this visit. History, Physical, and Plan performed by medical provider. The above documentation has been reviewed and is accurate and complete.  In addition to time spent today for physical, time spent with patient today regarding acute issues such as right lower leg swelling and neurological issues was 25 minutes which consisted of chart review, discussing diagnosis, work up, treatment answering questions and documentation.   Inda Coke, PA-C [place]

## 2021-02-07 ENCOUNTER — Telehealth: Payer: Self-pay

## 2021-02-07 NOTE — Telephone Encounter (Signed)
See below, this is a Counselling psychologist pt.

## 2021-02-07 NOTE — Telephone Encounter (Signed)
Received a call from [facility] and they stated that with the patients insurance, Medicare B they will need a dx code for the albuterol. Wondering if someone is able to provide this for them.

## 2021-02-08 ENCOUNTER — Other Ambulatory Visit: Payer: Self-pay | Admitting: Physician Assistant

## 2021-02-08 ENCOUNTER — Telehealth: Payer: Self-pay

## 2021-02-08 ENCOUNTER — Ambulatory Visit
Admission: RE | Admit: 2021-02-08 | Discharge: 2021-02-08 | Disposition: A | Discharge status: Home / Self Care | Payer: Medicare Other | Source: Ambulatory Visit | Attending: Physician Assistant | Admitting: Physician Assistant

## 2021-02-08 DIAGNOSIS — G459 Transient cerebral ischemic attack, unspecified: Secondary | ICD-10-CM

## 2021-02-08 IMAGING — US US CAROTID DUPLEX BILAT
1 series · 13 of 24 positions shown · non-contrast
Comparison: None.

CLINICAL DATA: 65-year-old female with a history TIA

EXAM:
BILATERAL CAROTID DUPLEX ULTRASOUND
TECHNIQUE: Gray scale imaging, color Doppler and duplex ultrasound were
performed of bilateral carotid and vertebral arteries in the neck.

[Series 1: us carotid duplex bilat · 0.07mm/px · 13 of 62 slices shown]
[im 1/62]
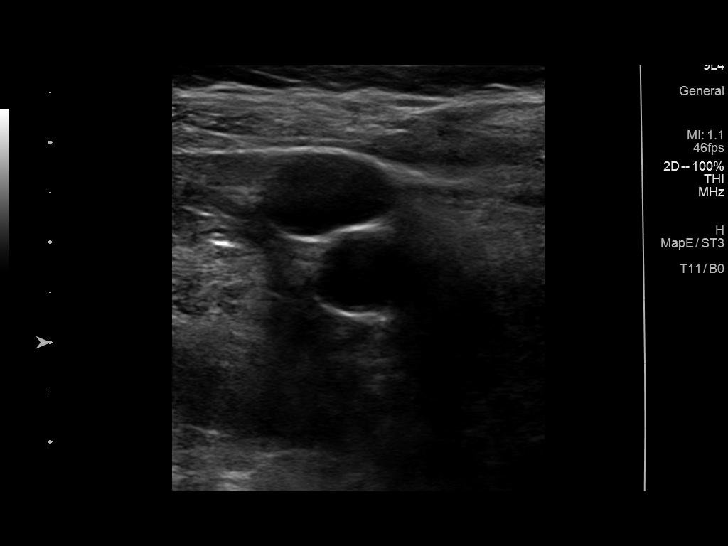
[im 6/62]
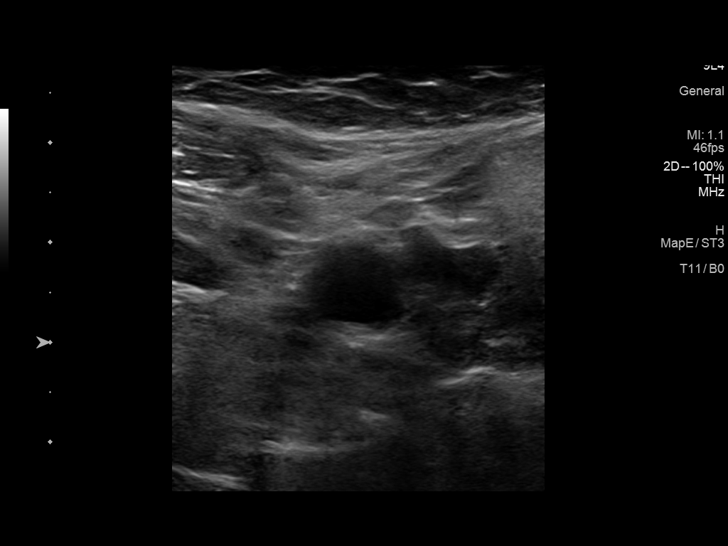
[im 11/62]
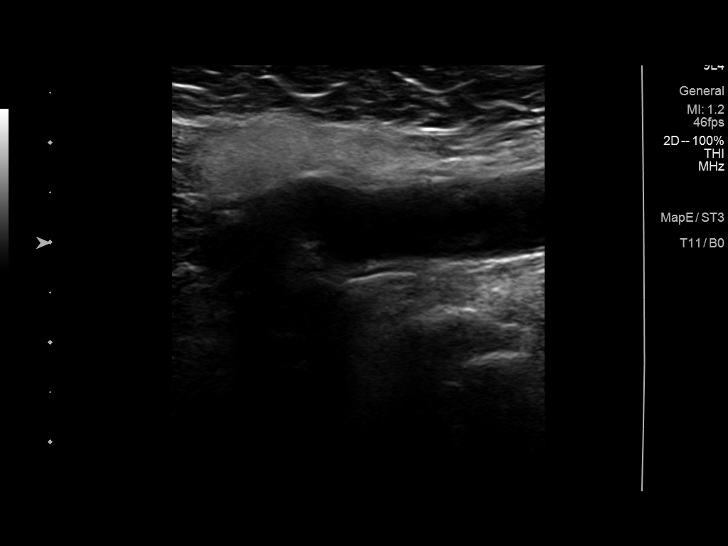
[im 16/62]
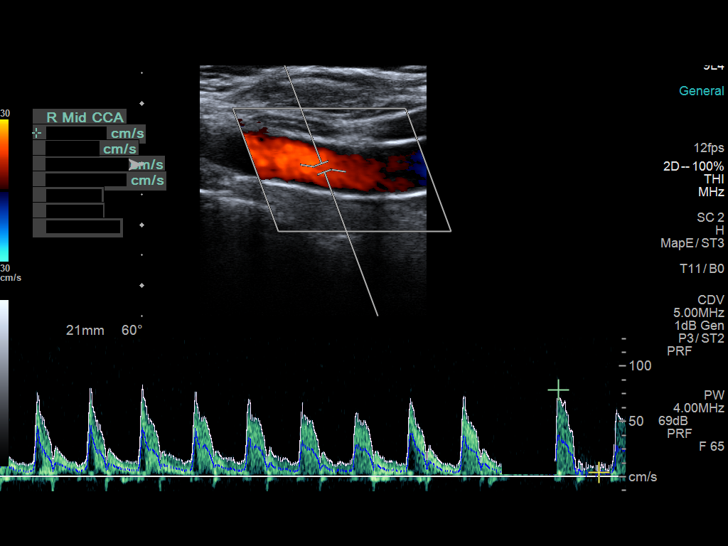
[im 22/62]
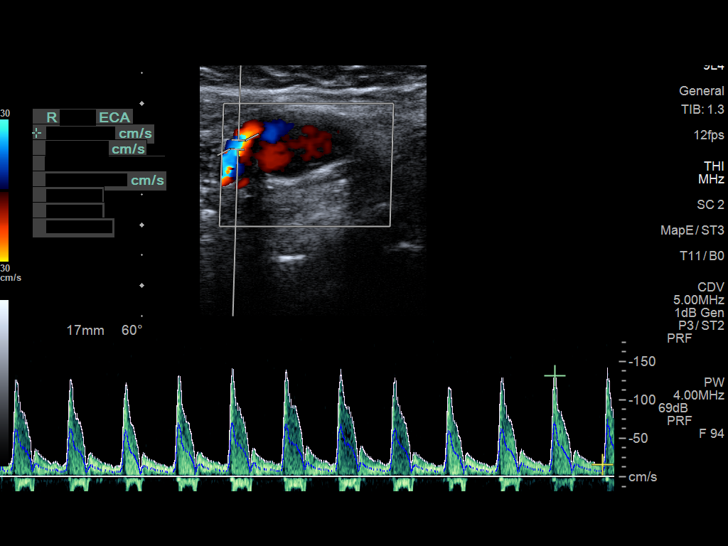
[im 27/62]
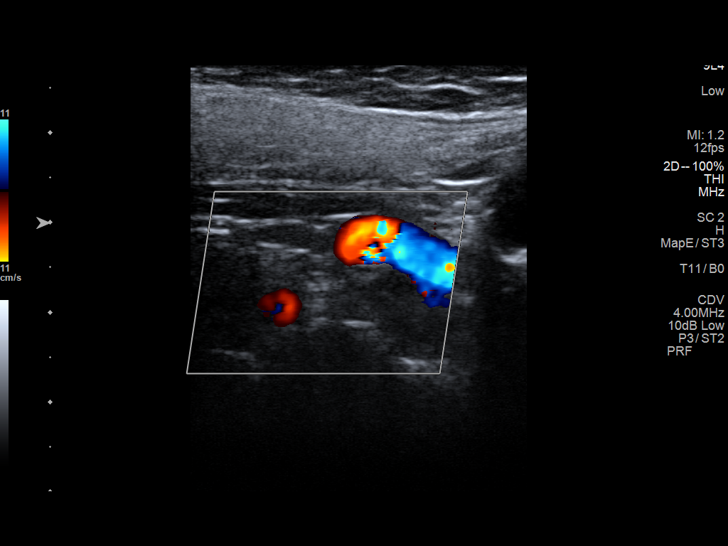
[im 32/62]
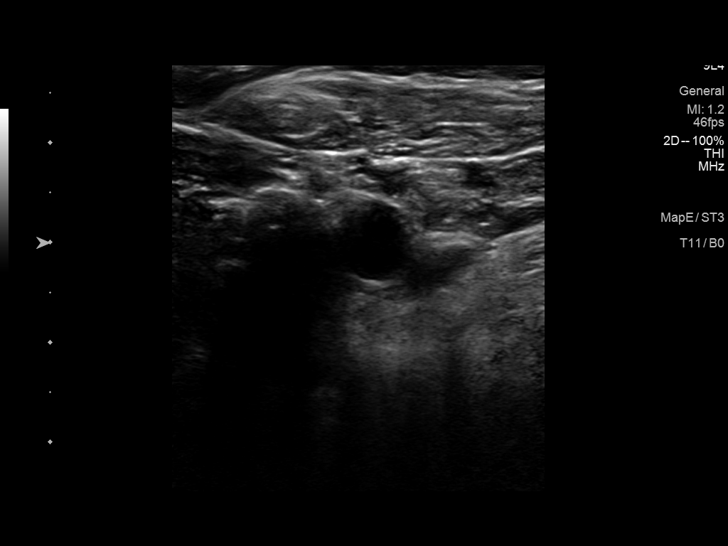
[im 35/62]
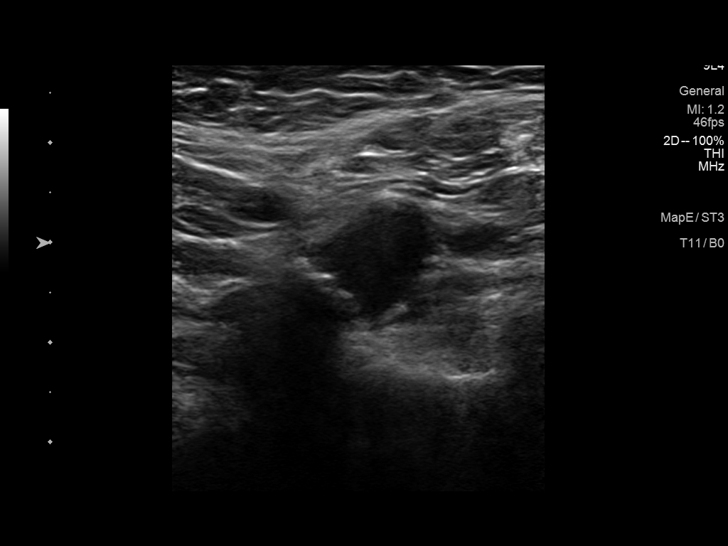
[im 40/62]
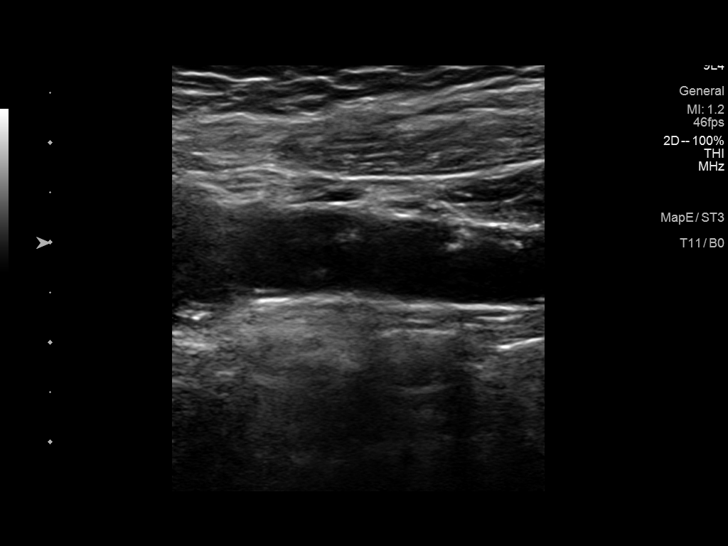
[im 46/62]
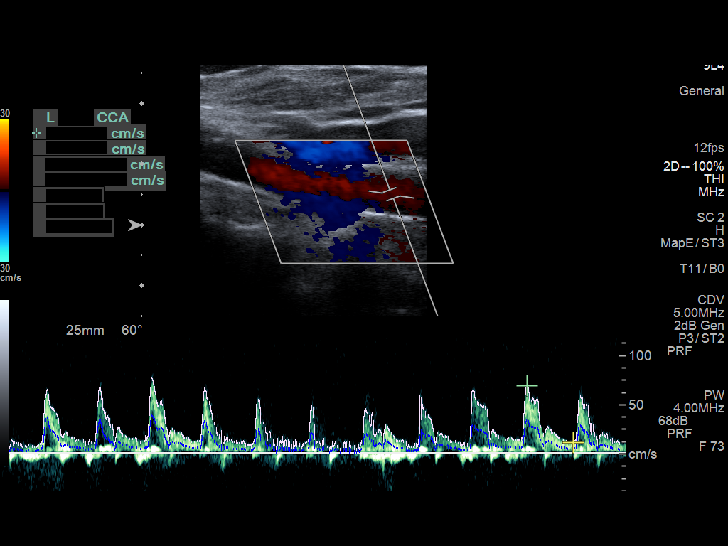
[im 51/62]
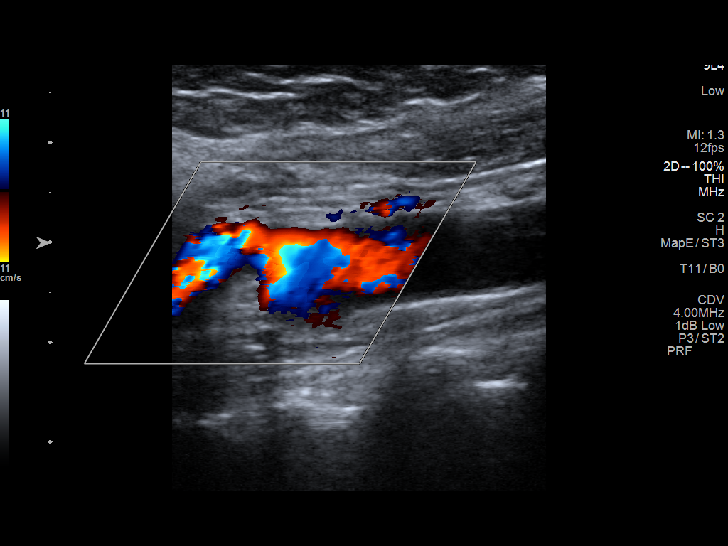
[im 56/62]
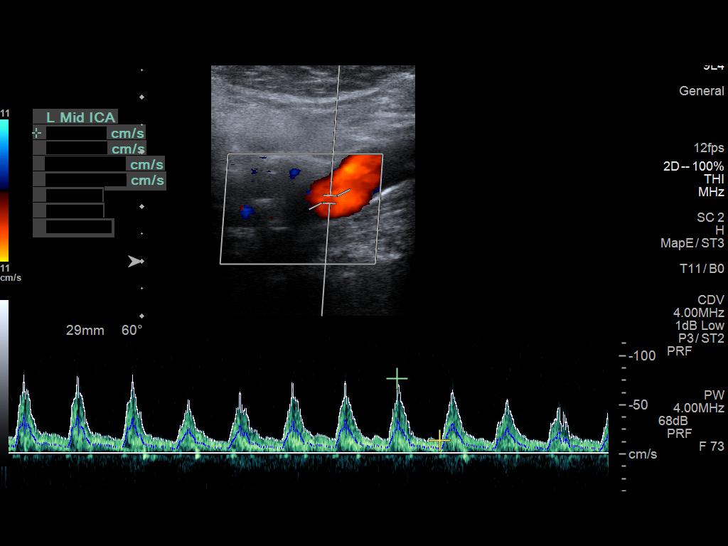
[im 62/62]
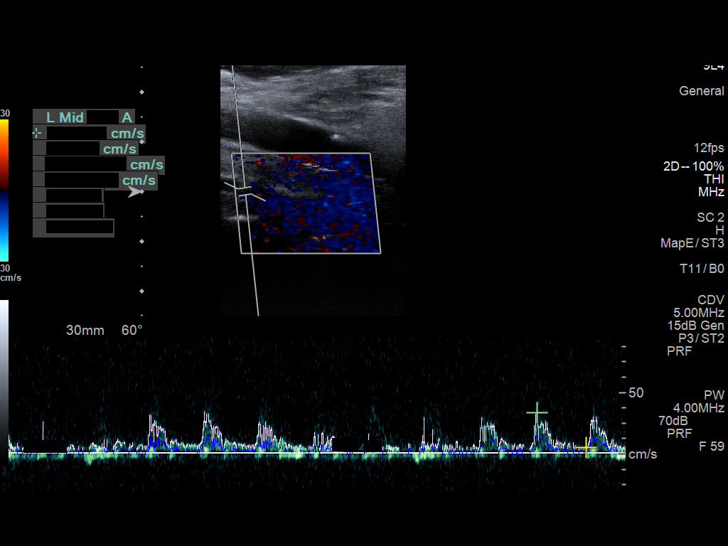

[13 of 24 positions shown; findings below may reference images not displayed]

FINDINGS: Criteria: Quantification of carotid stenosis is based on velocity
parameters that correlate the residual internal carotid diameter
with NASCET-based stenosis levels, using the diameter of the distal
internal carotid lumen as the denominator for stenosis measurement.

The following velocity measurements were obtained:

RIGHT

ICA:  Systolic 117 cm/sec, Diastolic 18 cm/sec

CCA:  78 cm/sec

SYSTOLIC ICA/CCA RATIO:

ECA:  131 cm/sec

LEFT

ICA:  Systolic 75 cm/sec, Diastolic 10 cm/sec

CCA:  87 cm/sec

SYSTOLIC ICA/CCA RATIO:

ECA:  82 cm/sec

Right Brachial SBP: 150

Left Brachial SBP: 137

RIGHT CAROTID ARTERY: No significant calcified disease of the right
common carotid artery. Intermediate waveform maintained.
Heterogeneous plaque without significant calcifications at the right
carotid bifurcation. Low resistance waveform of the right ICA. No
significant tortuosity.

RIGHT VERTEBRAL ARTERY: Antegrade flow with low resistance waveform.

LEFT CAROTID ARTERY: No significant calcified disease of the left
common carotid artery. Intermediate waveform maintained.
Heterogeneous plaque at the left carotid bifurcation without
significant calcifications. Low resistance waveform of the left ICA.

LEFT VERTEBRAL ARTERY:  Antegrade flow with low resistance waveform.
IMPRESSION: Color duplex indicates minimal heterogeneous plaque, with no
hemodynamically significant stenosis by duplex criteria in the
extracranial cerebrovascular circulation.

## 2021-02-08 MED ORDER — ATORVASTATIN CALCIUM 20 MG PO TABS: 20.0000 mg | ORAL_TABLET | Freq: Every day | ORAL | 3 refills | Status: DC

## 2021-02-08 NOTE — Telephone Encounter (Signed)
Pt called about labs. Please Advise

## 2021-02-08 NOTE — Telephone Encounter (Signed)
See result notes. 

## 2021-02-08 NOTE — Telephone Encounter (Signed)
Called [facility] and spoke to pharmacist Josh, told him someone called for Dx code for Albuterol, it is COPD J44.9. Josh verbalized understanding.

## 2021-02-28 ENCOUNTER — Other Ambulatory Visit: Payer: Self-pay | Admitting: *Deleted

## 2021-03-05 NOTE — Progress Notes (Deleted)
   I, Wendy Poet, LAT, ATC, am serving as scribe for Dr. Lynne Leader.  Holly Phillips is a 65 y.o. female who presents to [place] [facility] today for f/u of a R gastroc strain that occurred on 12/24/20 when she fell after getting off the toilet.  She was seen at the [facility] ED on 01/13/21 w/ c/o con't R lower leg pain and swelling.  She had a R LE venous doppler US on 01/04/21 and 01/15/21.  The Doppler showed hematoma or seroma posterior calf.  She was last seen by Dr Georgina Snell on 02/06/21 and was advised to con't w/ calf compression and was shown a HEP focusing on gastroc eccentric strengthening.  Today, pt reports   Diagnostic testing: R LE venous doppler US- 01/15/21, 01/04/21  Pertinent review of systems: ***  Relevant historical information: ***   Exam:  LMP  (LMP Unknown)  General: Well Developed, well nourished, and in no acute distress.   MSK: ***    Lab and Radiology Results No results found for this or any previous visit (from the past 72 hour(s)). No results found.     Assessment and Plan: 65 y.o. female with ***   PDMP not reviewed this encounter. No orders of the defined types were placed in this encounter.  No orders of the defined types were placed in this encounter.    Discussed warning signs or symptoms. Please see discharge instructions. Patient expresses understanding.   ***

## 2021-03-06 ENCOUNTER — Ambulatory Visit: Payer: Medicare Other | Admitting: Family Medicine

## 2021-03-12 ENCOUNTER — Ambulatory Visit
Admission: RE | Admit: 2021-03-12 | Discharge: 2021-03-12 | Disposition: A | Discharge status: Home / Self Care | Payer: Medicare Other | Source: Ambulatory Visit | Attending: Physician Assistant | Admitting: Physician Assistant

## 2021-03-12 ENCOUNTER — Ambulatory Visit: Payer: Medicare Other

## 2021-03-12 ENCOUNTER — Other Ambulatory Visit: Payer: Self-pay

## 2021-03-12 DIAGNOSIS — Z1231 Encounter for screening mammogram for malignant neoplasm of breast: Secondary | ICD-10-CM

## 2021-03-12 IMAGING — MG MM DIGITAL SCREENING BILAT W/ TOMO AND CAD
8 series · 8 of 24 positions shown · non-contrast
Comparison: None.

CLINICAL DATA: Screening.

EXAM:
DIGITAL SCREENING BILATERAL MAMMOGRAM WITH TOMOSYNTHESIS AND CAD
TECHNIQUE: Bilateral screening digital craniocaudal and mediolateral oblique
mammograms were obtained. Bilateral screening digital breast
tomosynthesis was performed. The images were evaluated with
computer-aided detection.

[R MLO synth-2D]
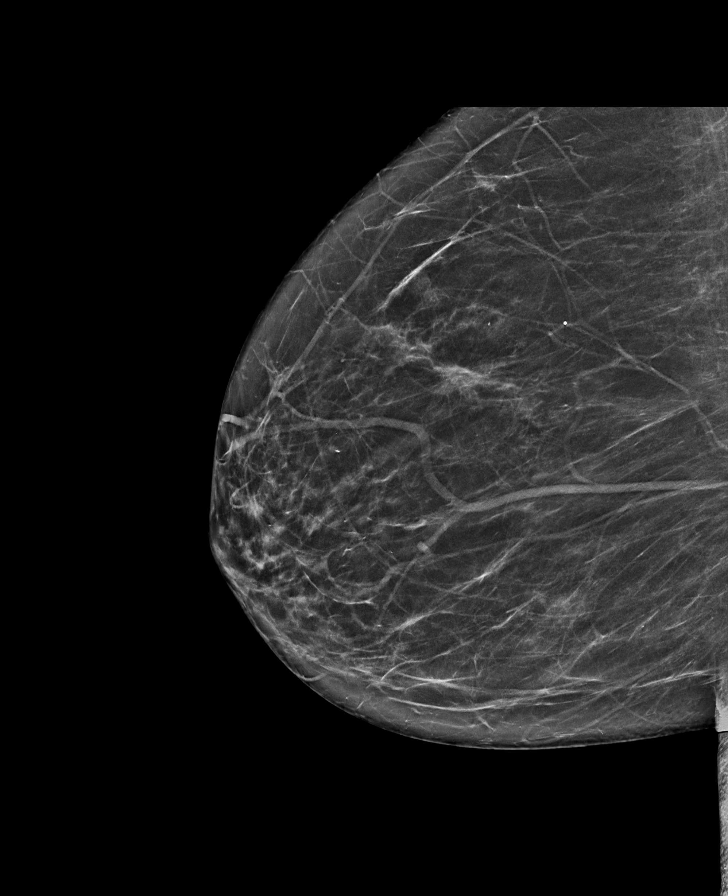

[L CC synth-2D]
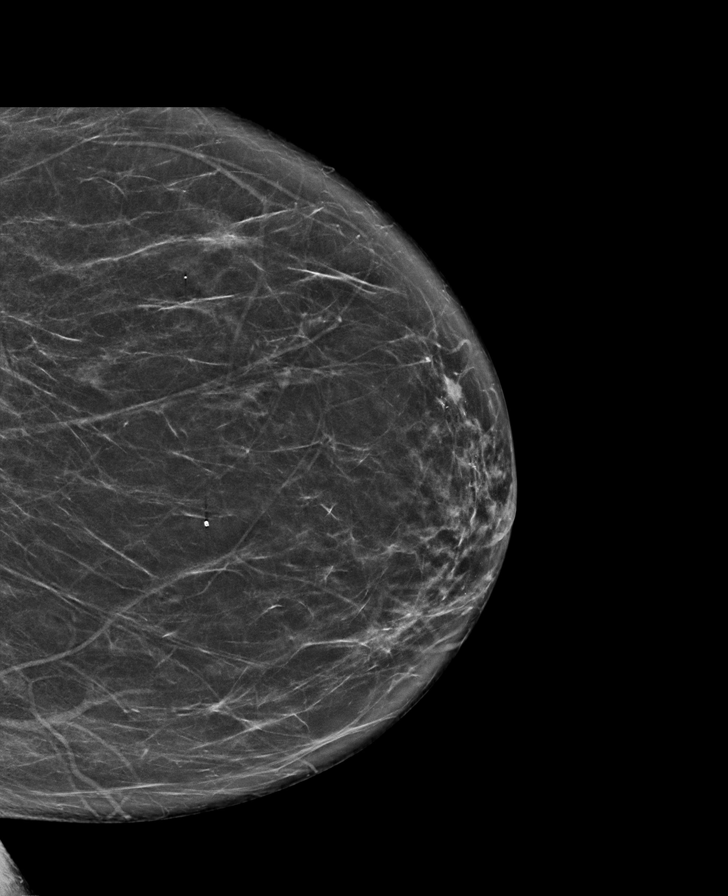

[L MLO synth-2D]
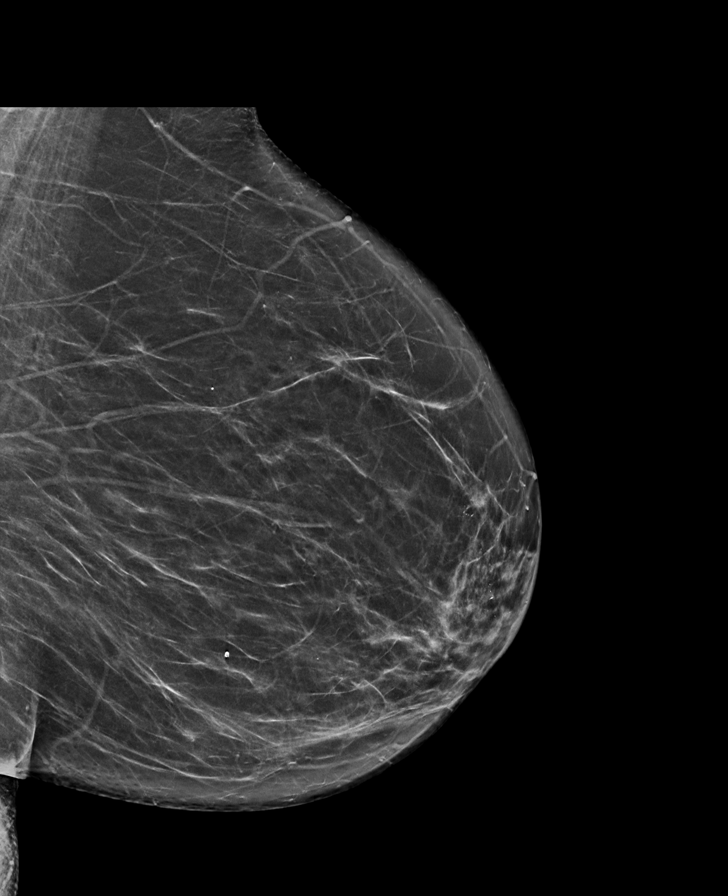

[R CC synth-2D]
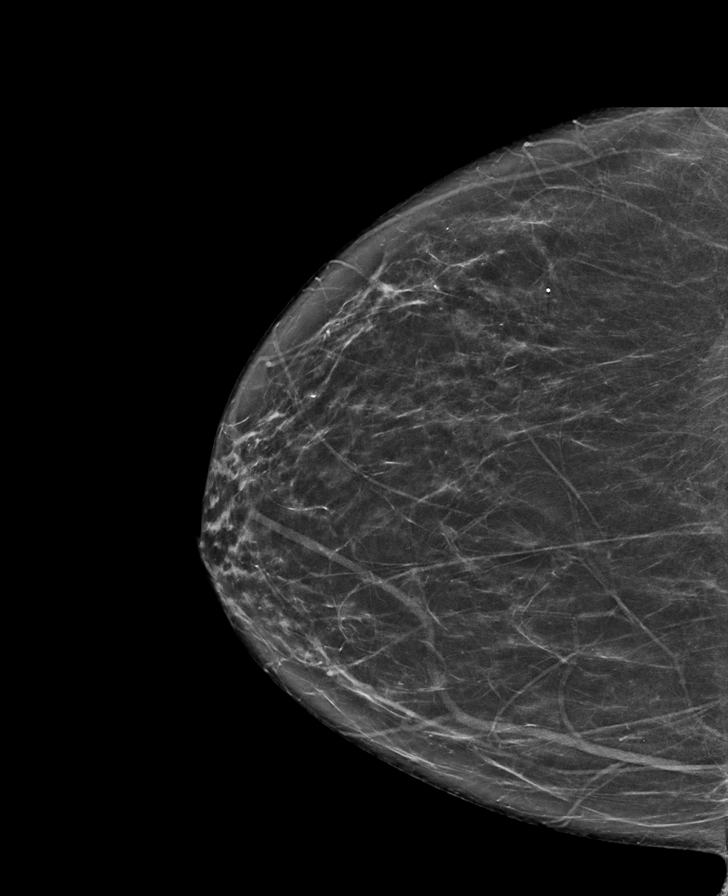

[L CC tomo · tomo slice 36/71.0]
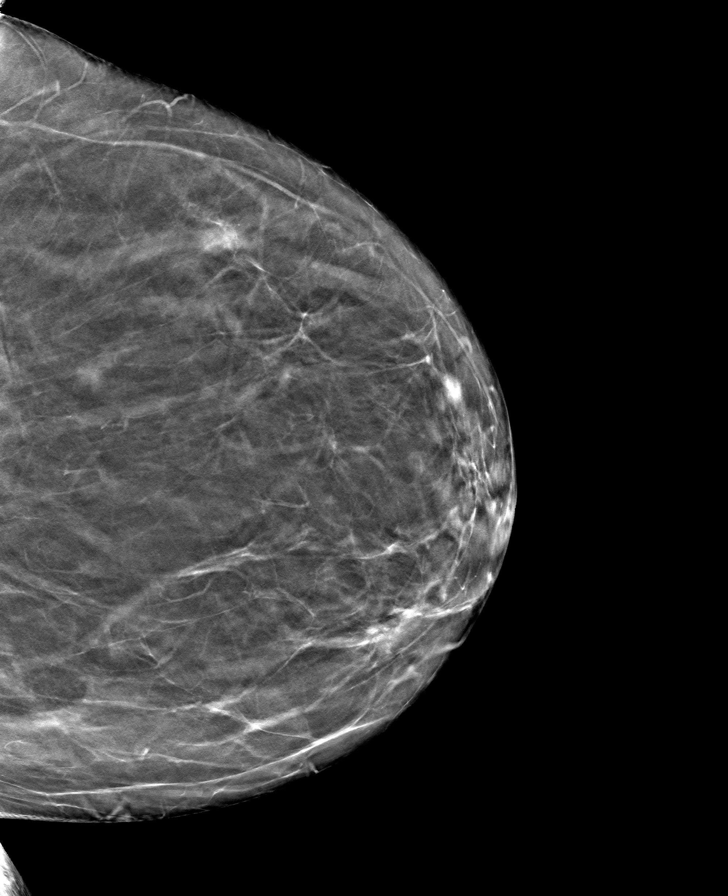

[L MLO tomo · tomo slice 39/76.0]
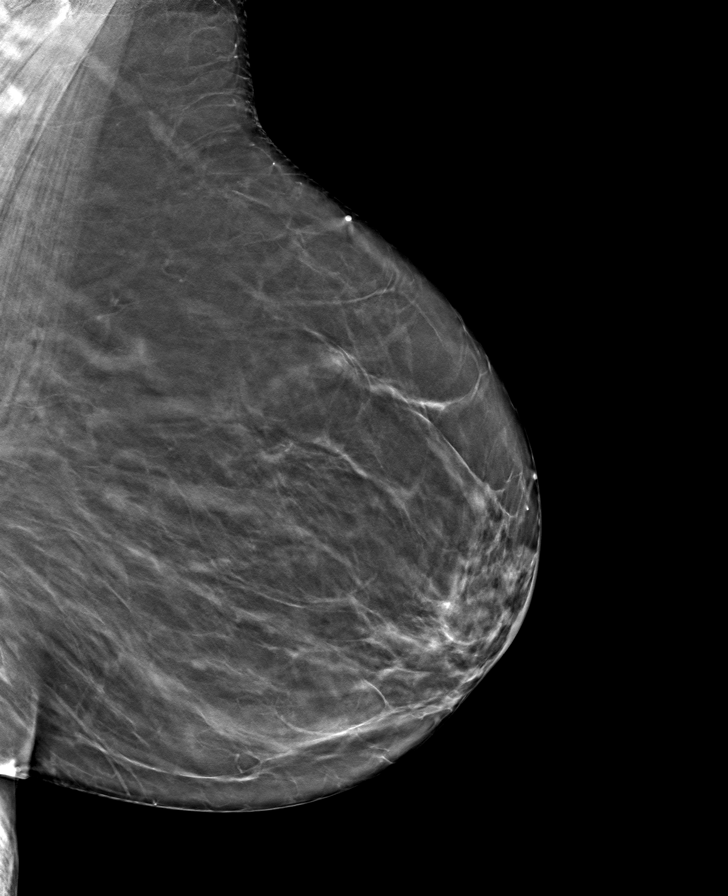

[R CC tomo · tomo slice 37/72.0]
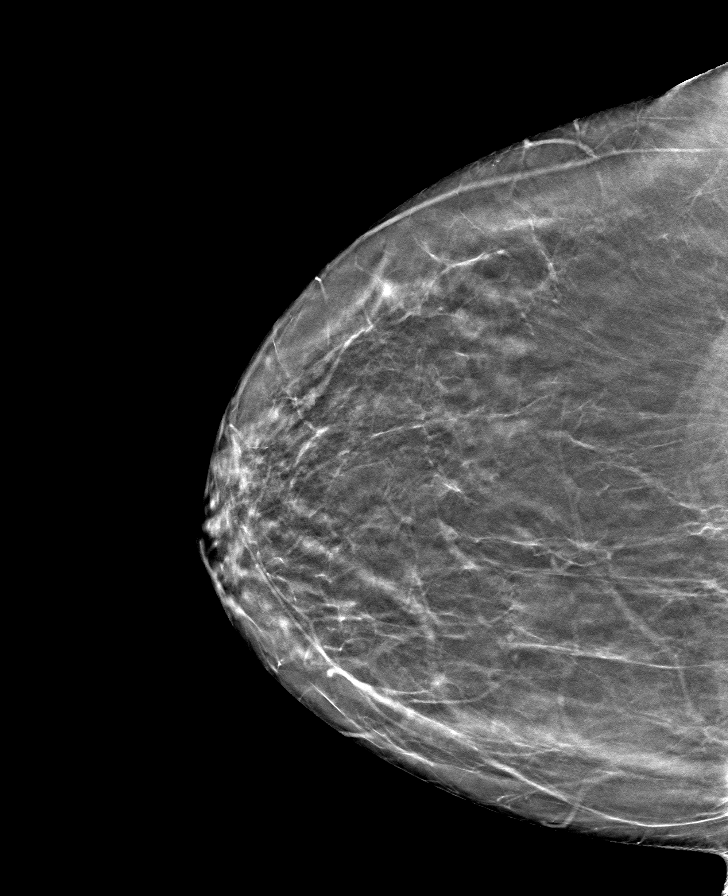

[R MLO tomo · tomo slice 35/70.0]
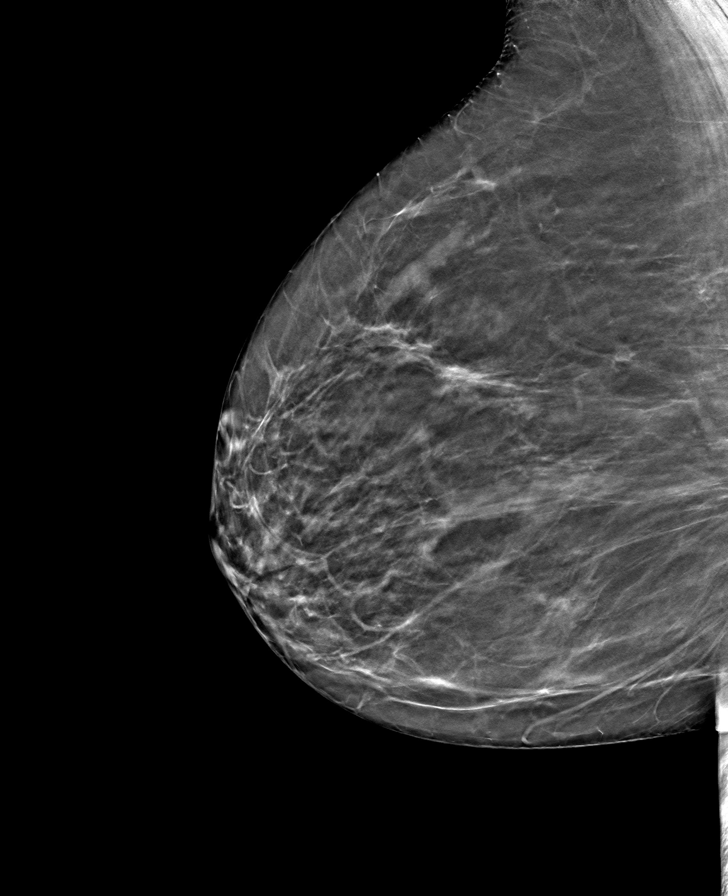

[8 of 24 positions shown; findings below may reference images not displayed]

ACR Breast Density Category b: There are scattered areas of
fibroglandular density.
FINDINGS: In the right breast an asymmetry requires further evaluation.

In the left breast a possible mass requires further evaluation.
IMPRESSION: Further evaluation is suggested for an asymmetry in the right
breast.

Further evaluation is suggested for a possible mass in the left
breast.

RECOMMENDATION:
Diagnostic mammogram and possibly ultrasound of both breasts.
(Code:[90])

The patient will be contacted regarding the findings, and additional
imaging will be scheduled.

BI-RADS CATEGORY  0: Incomplete. Need additional imaging evaluation
and/or prior mammograms for comparison.

## 2021-03-15 ENCOUNTER — Other Ambulatory Visit: Payer: Self-pay | Admitting: Physician Assistant

## 2021-03-15 DIAGNOSIS — R928 Other abnormal and inconclusive findings on diagnostic imaging of breast: Secondary | ICD-10-CM

## 2021-03-27 ENCOUNTER — Inpatient Hospital Stay: Admission: RE | Admit: 2021-03-27 | Payer: Medicare Other | Source: Ambulatory Visit

## 2021-03-27 ENCOUNTER — Other Ambulatory Visit: Payer: Medicare Other

## 2021-04-22 ENCOUNTER — Encounter: Payer: Self-pay | Admitting: Diagnostic Neuroimaging

## 2021-04-22 ENCOUNTER — Ambulatory Visit
Admission: RE | Admit: 2021-04-22 | Discharge: 2021-04-22 | Disposition: A | Discharge status: Home / Self Care | Payer: Medicare Other | Source: Ambulatory Visit | Attending: Physician Assistant | Admitting: Physician Assistant

## 2021-04-22 ENCOUNTER — Other Ambulatory Visit: Payer: Self-pay

## 2021-04-22 ENCOUNTER — Ambulatory Visit: Payer: Medicare Other

## 2021-04-22 ENCOUNTER — Other Ambulatory Visit: Payer: Self-pay | Admitting: Physician Assistant

## 2021-04-22 ENCOUNTER — Ambulatory Visit (INDEPENDENT_AMBULATORY_CARE_PROVIDER_SITE_OTHER): Payer: Medicare Other | Admitting: Diagnostic Neuroimaging

## 2021-04-22 VITALS — BP 139/79 | HR 89 | Ht 59.0 in | Wt 146.0 lb

## 2021-04-22 DIAGNOSIS — G251 Drug-induced tremor: Secondary | ICD-10-CM | POA: Diagnosis not present

## 2021-04-22 DIAGNOSIS — R928 Other abnormal and inconclusive findings on diagnostic imaging of breast: Secondary | ICD-10-CM

## 2021-04-22 DIAGNOSIS — R413 Other amnesia: Secondary | ICD-10-CM

## 2021-04-22 IMAGING — MG DIGITAL DIAGNOSTIC BILAT W/ TOMO W/ CAD
8 series · 8 of 24 positions shown · non-contrast
Comparison: Previous exam(s).

CLINICAL DATA: 65-year-old female presenting as a recall from
baseline exam for possible right breast asymmetry and possible left
breast mass.

EXAM:
DIGITAL DIAGNOSTIC BILATERAL MAMMOGRAM WITH TOMOSYNTHESIS AND CAD;
ULTRASOUND LEFT BREAST LIMITED
TECHNIQUE: Bilateral digital diagnostic mammography and breast tomosynthesis
was performed. The images were evaluated with computer-aided
detection.; Targeted ultrasound examination of the left breast was
performed.

[L CC synth-2D]
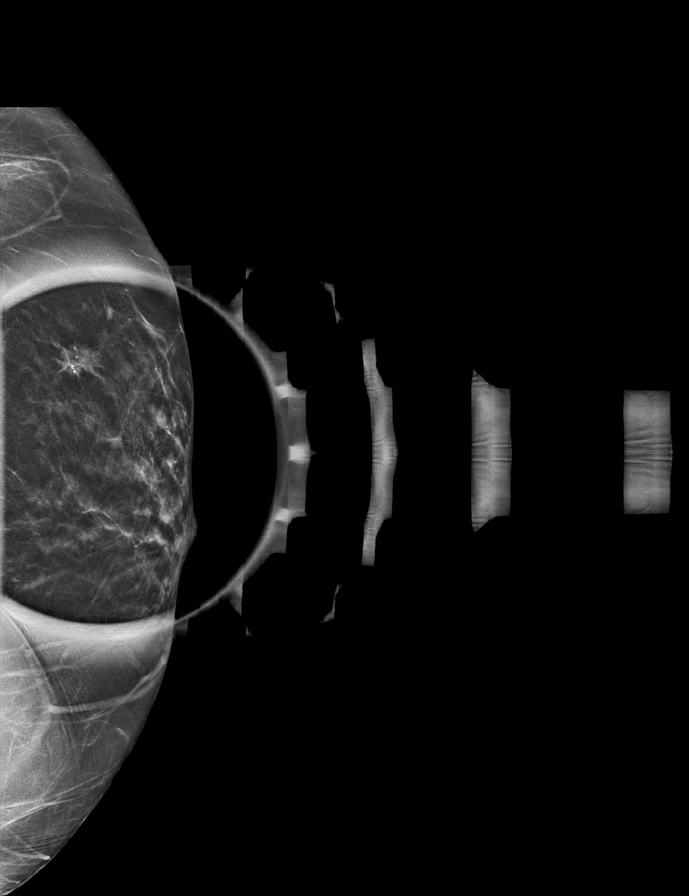

[R MLO synth-2D]
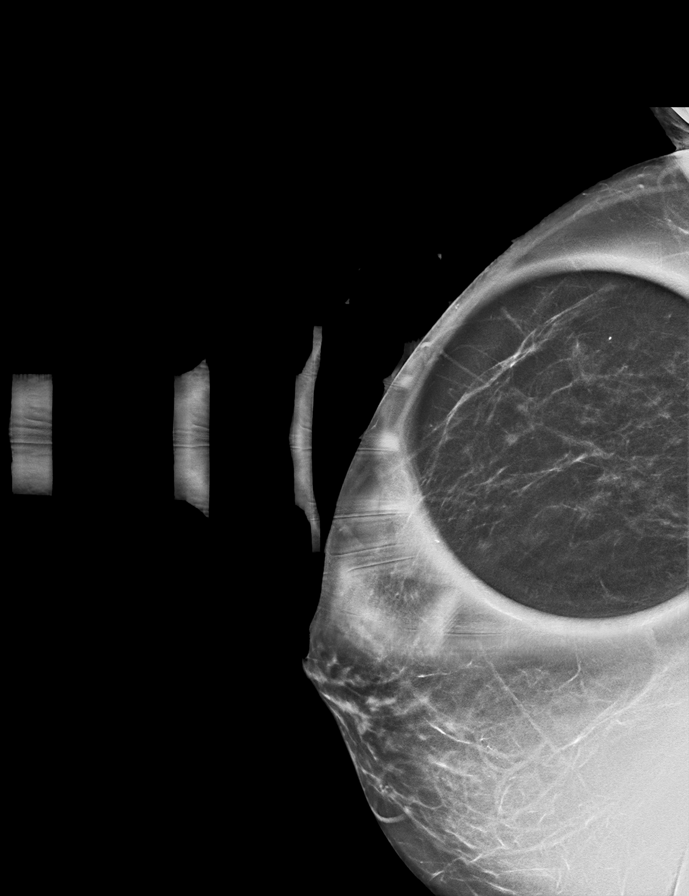

[L MLO synth-2D]
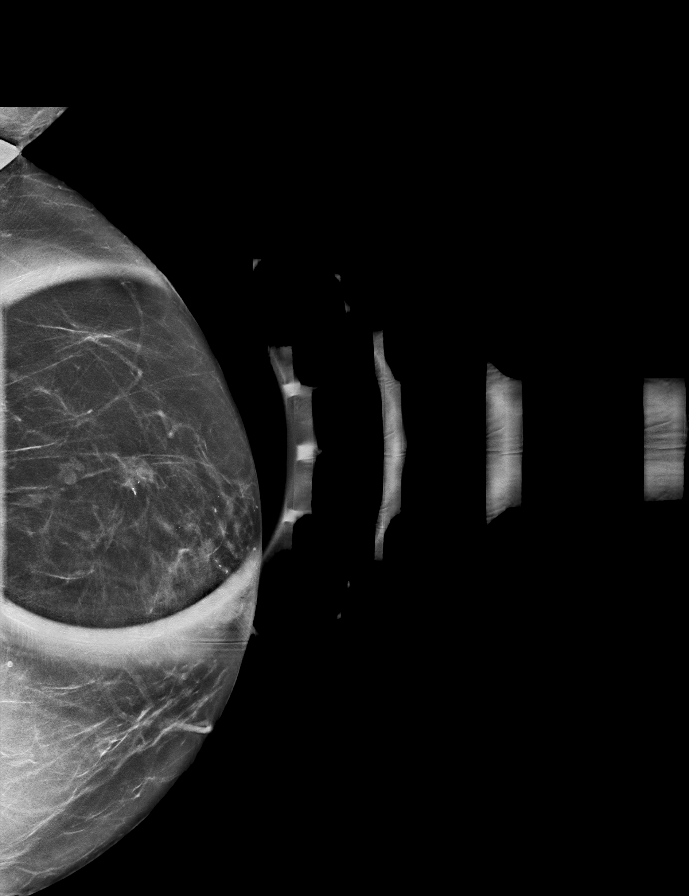

[R ML synth-2D]
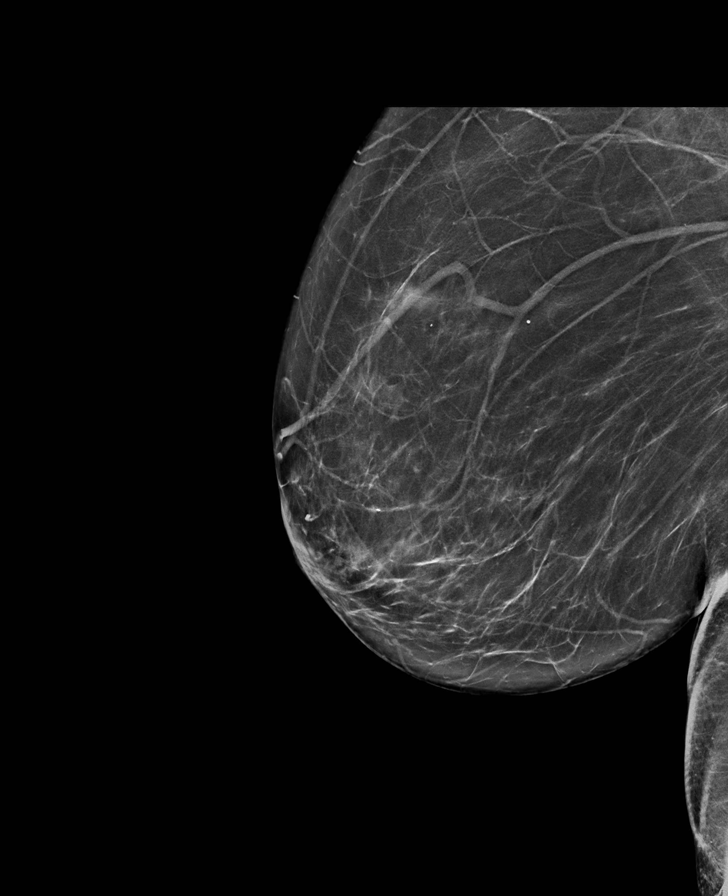

[R ML tomo · tomo slice 38/75.0]
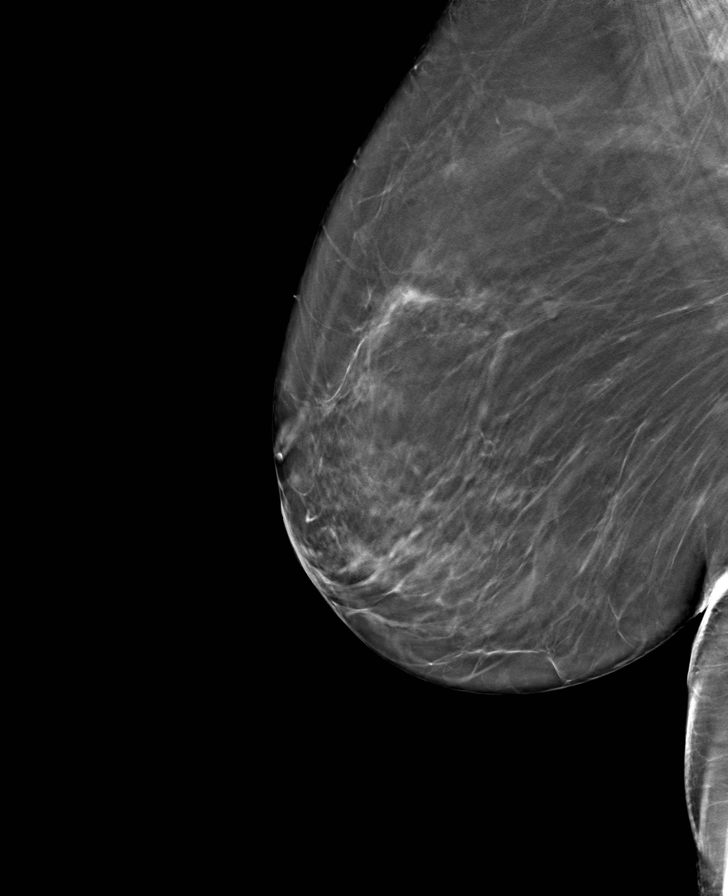

[L MLO tomo · tomo slice 29/56.0]
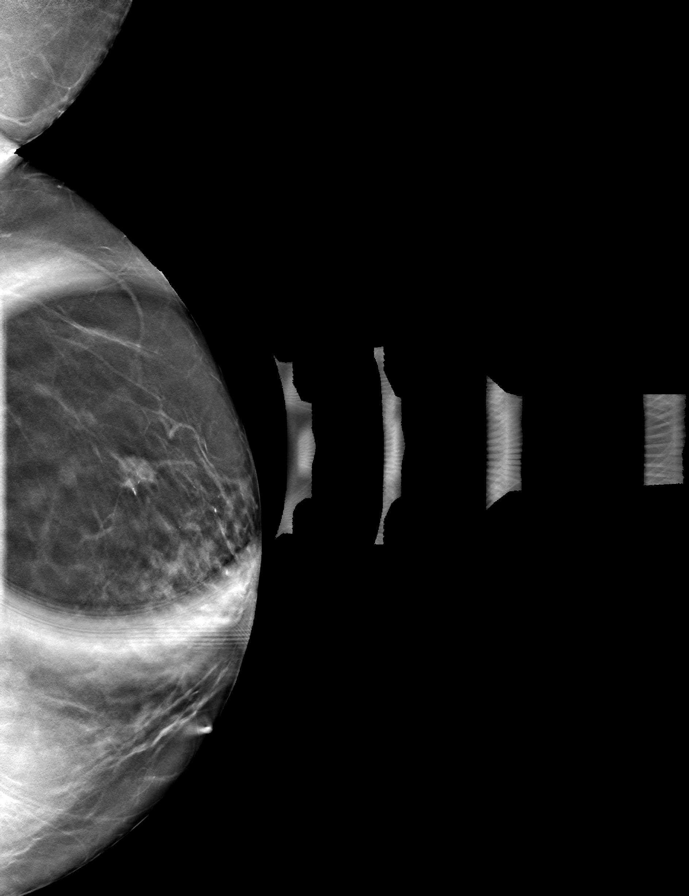

[L CC tomo · tomo slice 21/41.0]
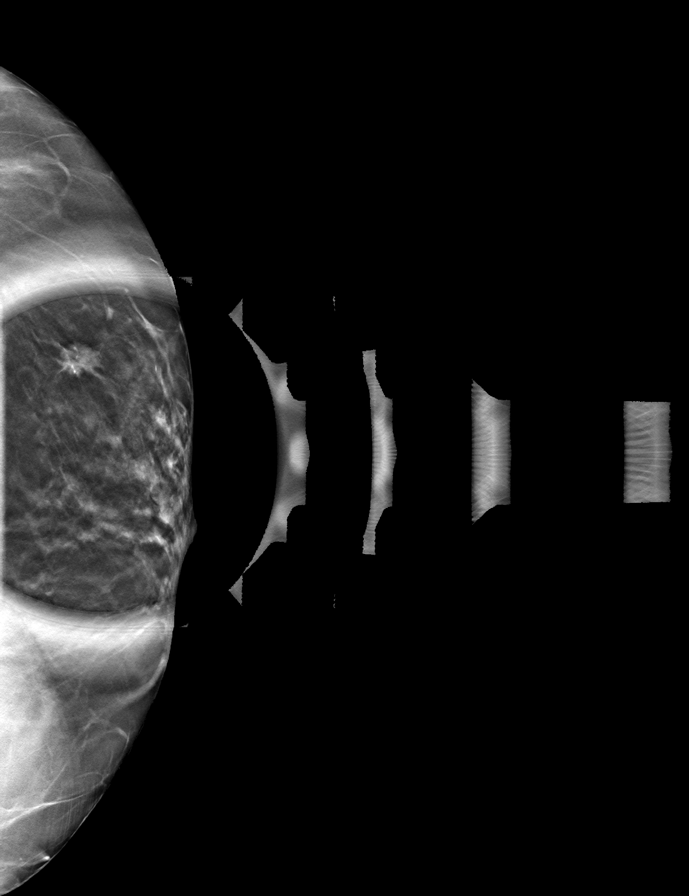

[R MLO tomo · tomo slice 27/52.0]
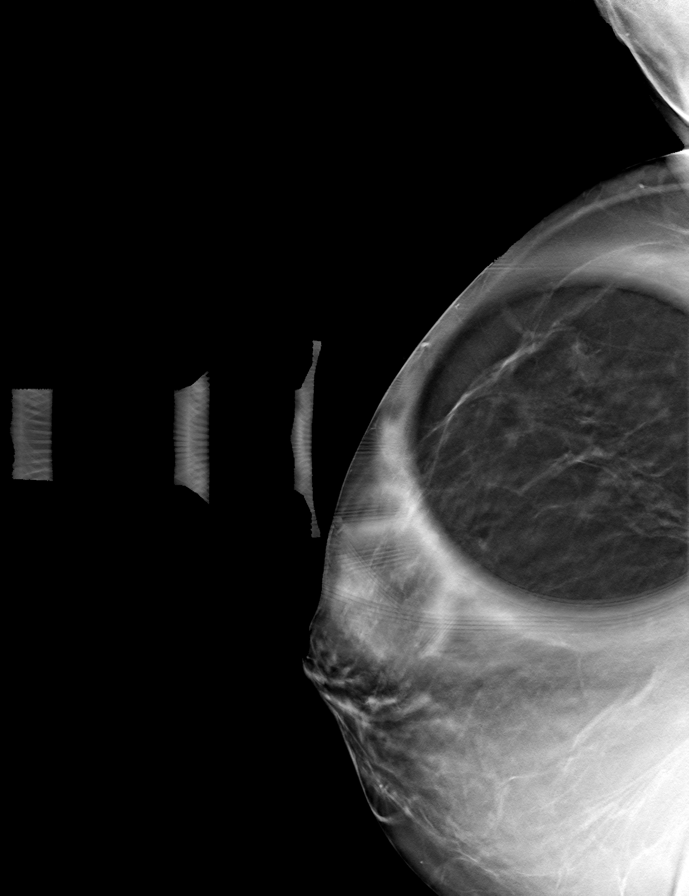

[8 of 24 positions shown; findings below may reference images not displayed]

ACR Breast Density Category b: There are scattered areas of
fibroglandular density.
FINDINGS: Mammogram:

Right breast: Spot compression tomosynthesis MLO and full field mL
tomosynthesis views of the right breast performed for an asymmetry
seen only on MLO view in the superior right breast. On the
additional imaging the tissue in this area disperses without
persistent asymmetry, mass, or distortion.

Left breast: Spot compression tomosynthesis views of the left breast
were performed demonstrating persistence of a 0.9 cm irregular mass
in the upper outer left breast anterior depth with a few associated
calcifications.

Ultrasound:

Targeted ultrasound performed in the left breast at 1 o'clock 2 cm
from the nipple demonstrating an irregular hypoechoic mass measuring
0.8 x 0.7 x 0.8 cm. This corresponds to the mammographic finding.

Targeted ultrasound of the left axilla demonstrates normal lymph
nodes.
IMPRESSION: 1. Suspicious mass in the left breast at 1 o'clock measuring 0.8 cm.

2.  Resolution of the questioned asymmetry in the right breast.

RECOMMENDATION:
Ultrasound-guided core needle biopsy of the left breast mass at 1
o'clock.

I have discussed the findings and recommendations with the patient
who agrees to proceed with biopsy. The patient will be scheduled for
the biopsy appointment prior to leaving the office today.

BI-RADS CATEGORY  4: Suspicious.

## 2021-04-22 IMAGING — US US BREAST*L* LIMITED INC AXILLA
1 series · 12 of 12 positions shown · non-contrast
Comparison: Previous exam(s).

CLINICAL DATA: 65-year-old female presenting as a recall from
baseline exam for possible right breast asymmetry and possible left
breast mass.

EXAM:
DIGITAL DIAGNOSTIC BILATERAL MAMMOGRAM WITH TOMOSYNTHESIS AND CAD;
ULTRASOUND LEFT BREAST LIMITED
TECHNIQUE: Bilateral digital diagnostic mammography and breast tomosynthesis
was performed. The images were evaluated with computer-aided
detection.; Targeted ultrasound examination of the left breast was
performed.

[Series 1: us breast*left* limited inc axilla · 0.06mm/px · 12 of 12 slices shown]
[im 1/12]
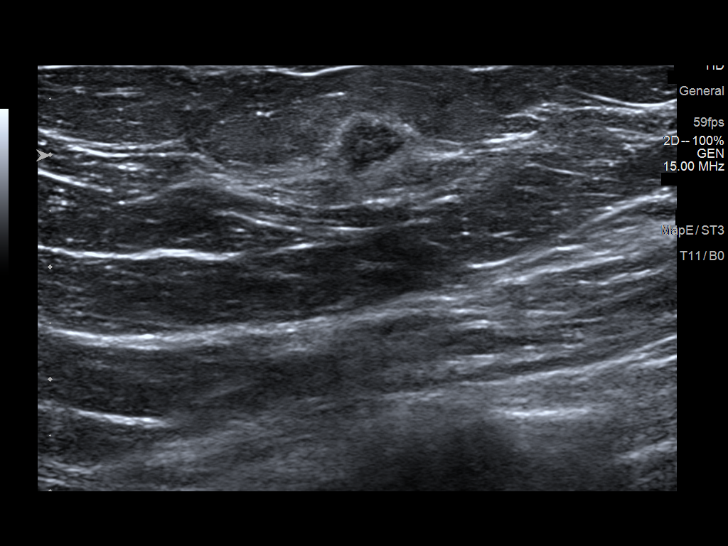
[im 2/12]
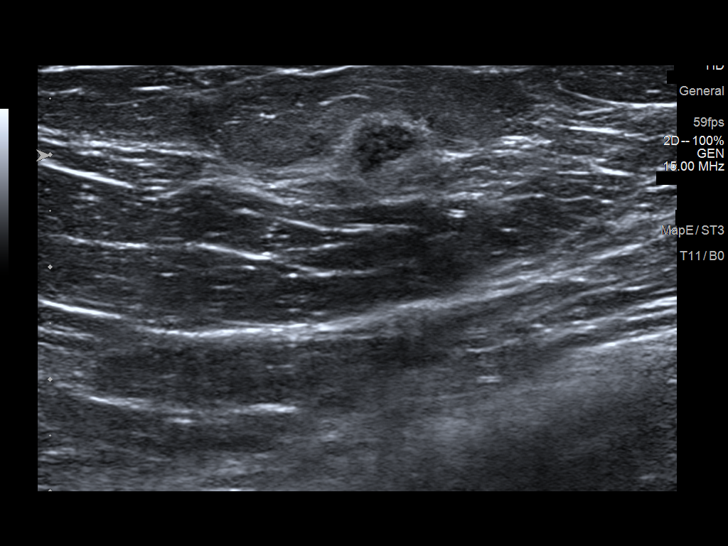
[im 3/12]
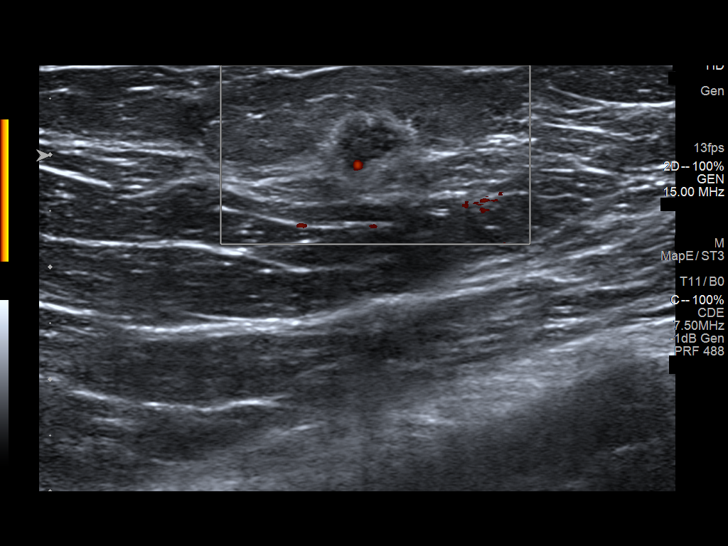
[im 4/12]
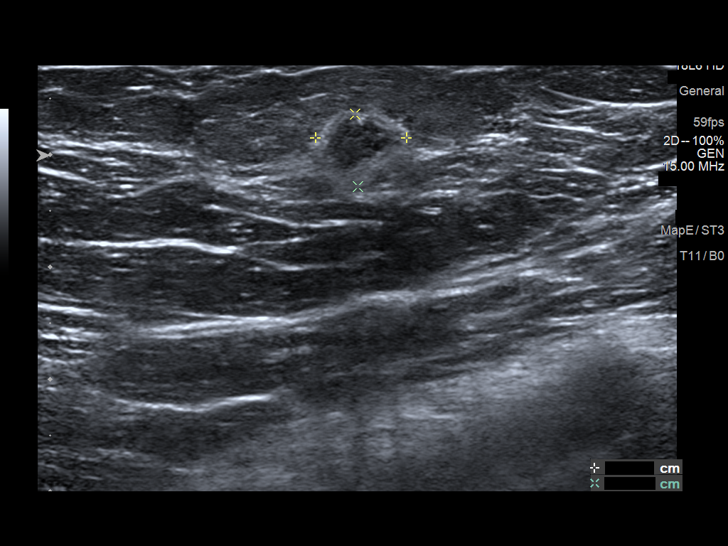
[im 5/12]
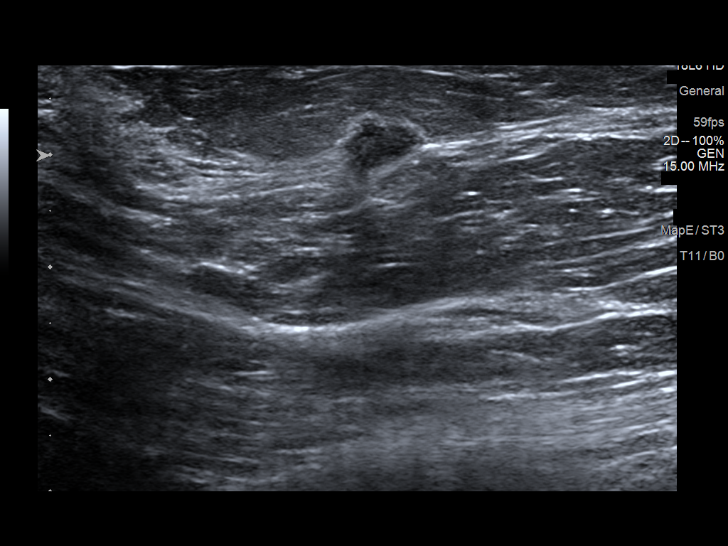
[im 6/12]
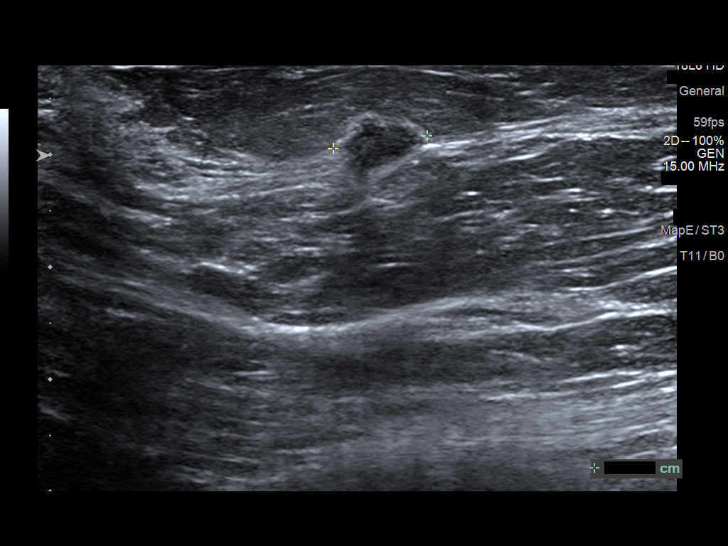
[im 7/12]
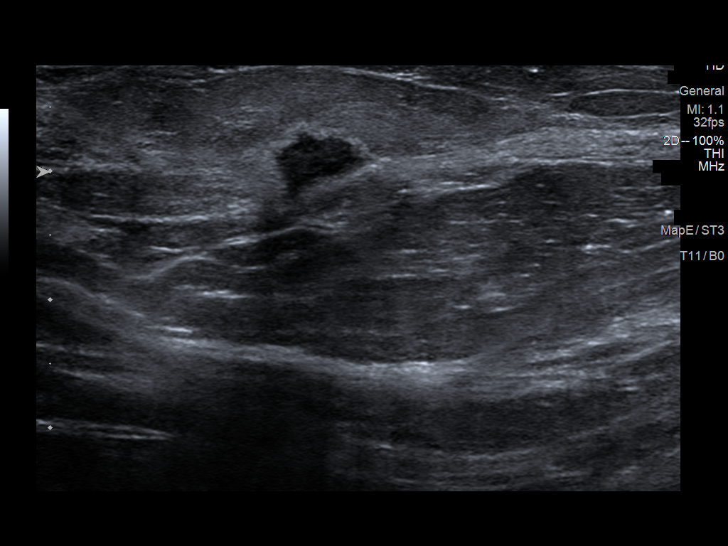
[im 8/12]
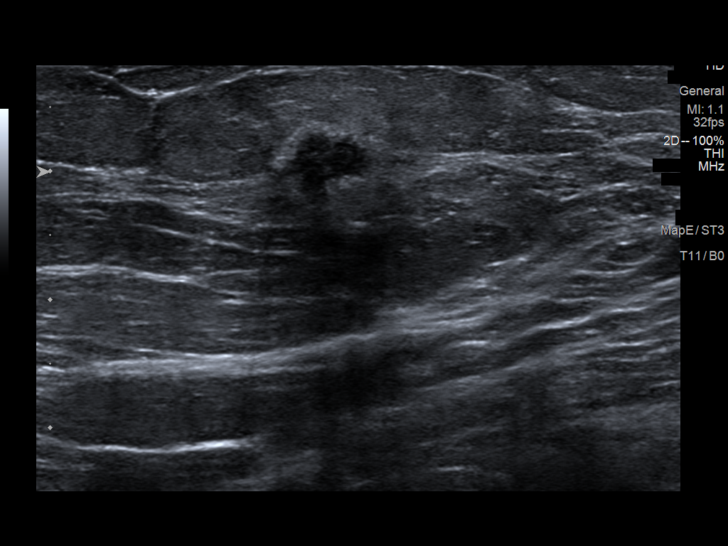
[im 9/12]
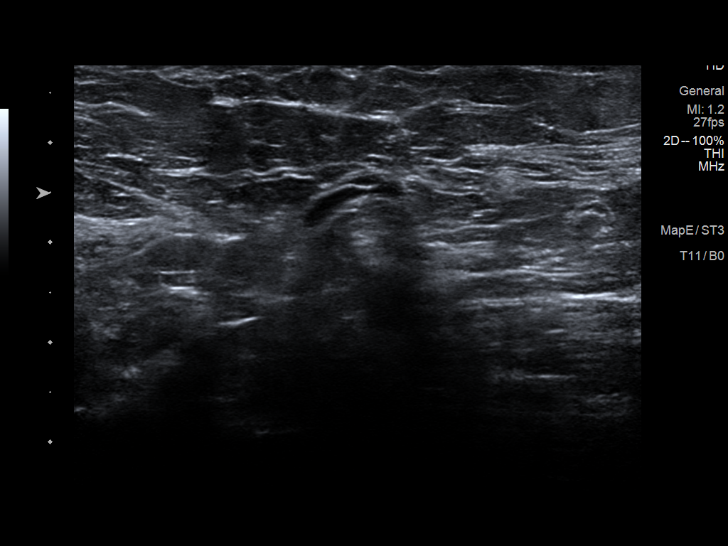
[im 10/12]
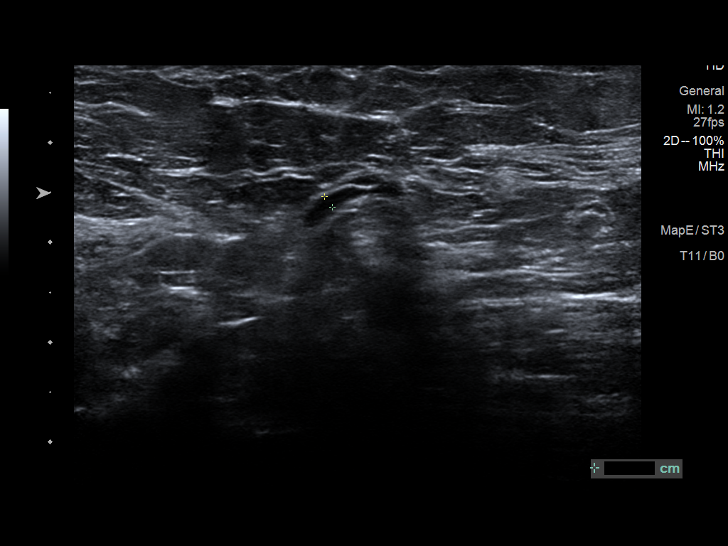
[im 11/12]
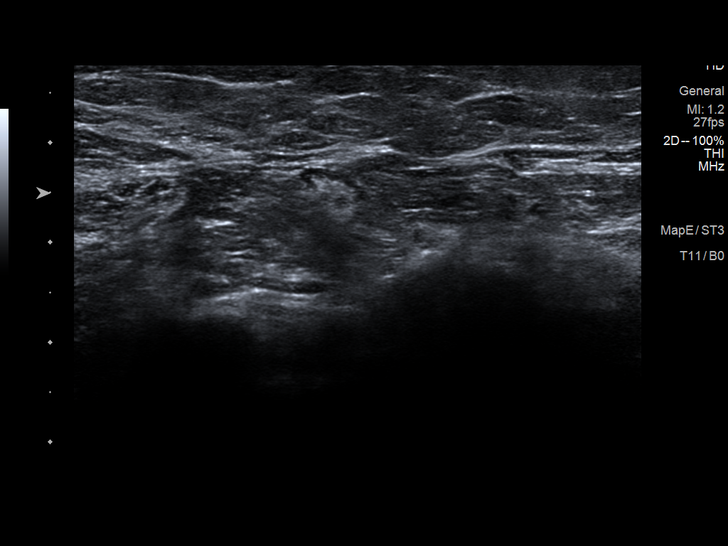
[im 12/12]
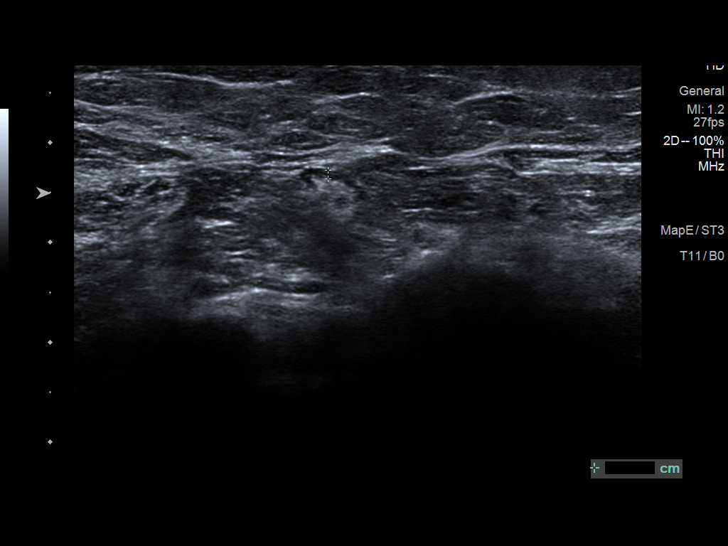

[12 of 12 positions shown; findings below may reference images not displayed]

ACR Breast Density Category b: There are scattered areas of
fibroglandular density.
FINDINGS: Mammogram:

Right breast: Spot compression tomosynthesis MLO and full field mL
tomosynthesis views of the right breast performed for an asymmetry
seen only on MLO view in the superior right breast. On the
additional imaging the tissue in this area disperses without
persistent asymmetry, mass, or distortion.

Left breast: Spot compression tomosynthesis views of the left breast
were performed demonstrating persistence of a 0.9 cm irregular mass
in the upper outer left breast anterior depth with a few associated
calcifications.

Ultrasound:

Targeted ultrasound performed in the left breast at 1 o'clock 2 cm
from the nipple demonstrating an irregular hypoechoic mass measuring
0.8 x 0.7 x 0.8 cm. This corresponds to the mammographic finding.

Targeted ultrasound of the left axilla demonstrates normal lymph
nodes.
IMPRESSION: 1. Suspicious mass in the left breast at 1 o'clock measuring 0.8 cm.

2.  Resolution of the questioned asymmetry in the right breast.

RECOMMENDATION:
Ultrasound-guided core needle biopsy of the left breast mass at 1
o'clock.

I have discussed the findings and recommendations with the patient
who agrees to proceed with biopsy. The patient will be scheduled for
the biopsy appointment prior to leaving the office today.

BI-RADS CATEGORY  4: Suspicious.

## 2021-04-22 NOTE — Patient Instructions (Signed)
  MILD MEMORY LOSS (since 2-3 years; due to insomnia, chronic pain, depression and polypharmacy; under treatment currently) - mild changes; MMSE 25/30; no changes in ADLs - safety / supervision issues reviewed - daily physical activity / exercise (at least 15-30 minutes) - eat more plants / vegetables - increase social activities, brain stimulation, games, puzzles, hobbies, crafts, arts, music - aim for at least 7-8 hours sleep per night (or more) - avoid smoking and alcohol - caution with medications, finances, driving  TREMORS - resolved after stopping abilify, remeron

## 2021-04-22 NOTE — Progress Notes (Signed)
[facility]  PATIENT: Holly Phillips DOB: 12/31/1955  REFERRING CLINICIAN: Inda Coke, PA HISTORY FROM: patient  REASON FOR VISIT: new consult    HISTORICAL  CHIEF COMPLAINT:  Chief Complaint  Patient presents with   TIA, memory, tremor    Rm 6 New Pt "after I stopped Abilify and Remeron my tremors stopped"  MMSE 25    HISTORY OF PRESENT ILLNESS:   65 year old female with tremors and memory loss.  Patient has been on Abilify and Remeron, after which patient developed some tremor.  Since stopping those medicines tremors have resolved.  Patient has some mild memory problems, but no major changes in ADLs.  Symptoms have been going on for last 6 to 12 months.  Has some chronic insomnia, depression issues, under reasonable control with medications.   REVIEW OF SYSTEMS: Full 14 system review of systems performed and negative with exception of: as per HPI.  ALLERGIES: Allergies  Allergen Reactions   Mucinex [Guaifenesin Er] Other (See Comments)    Jerky movements    HOME MEDICATIONS: Outpatient Medications Prior to Visit  Medication Sig Dispense Refill   albuterol (PROVENTIL) (2.5 MG/3ML) 0.083% nebulizer solution Take 3 mLs (2.5 mg total) by nebulization every 6 (six) hours as needed for wheezing or shortness of breath. 150 mL 1   atorvastatin (LIPITOR) 20 MG tablet Take 1 tablet (20 mg total) by mouth daily. 90 tablet 3   celecoxib (CELEBREX) 100 MG capsule TAKE 1 CAPSULE(100 MG) BY MOUTH DAILY 90 capsule 0   cyclobenzaprine (FLEXERIL) 10 MG tablet Take 1 tablet (10 mg total) by mouth 3 (three) times daily. 90 tablet 1   escitalopram (LEXAPRO) 20 MG tablet Take 20 mg by mouth daily.     furosemide (LASIX) 20 MG tablet Take 1 tablet (20 mg total) by mouth daily as needed for edema. 30 tablet 11   gabapentin (NEURONTIN) 800 MG tablet TAKE 1 TABLET(800 MG) BY MOUTH THREE TIMES DAILY 270 tablet 1   hydrOXYzine (VISTARIL) 50 MG capsule Take 50 mg  by mouth 4 (four) times daily.     lidocaine (LIDODERM) 5 % Place 3 patches onto the skin daily as needed (pain).     montelukast (SINGULAIR) 10 MG tablet TAKE 1 TABLET(10 MG) BY MOUTH DAILY 90 tablet 1   omeprazole (PRILOSEC) 40 MG capsule Take 1 capsule (40 mg total) by mouth 2 (two) times daily. 90 capsule 1   oxyCODONE-acetaminophen (PERCOCET) 10-325 MG tablet Take 1 tablet by mouth 4 (four) times daily as needed.     tiZANidine (ZANAFLEX) 4 MG tablet Take 4 mg by mouth 3 (three) times daily.     traZODone (DESYREL) 100 MG tablet Take 200 mg by mouth at bedtime.     traZODone (DESYREL) 150 MG tablet Take 150 mg by mouth at bedtime.     albuterol (VENTOLIN HFA) 108 (90 Base) MCG/ACT inhaler INHALE 1 TO 2 PUFFS INTO THE LUNGS EVERY 6 HOURS AS NEEDED FOR WHEEZING OR SHORTNESS OF BREATH (Patient not taking: Reported on 04/22/2021) 18 g 5   budesonide-formoterol (SYMBICORT) 160-4.5 MCG/ACT inhaler Inhale 2 puffs into the lungs 2 (two) times daily. (Patient not taking: Reported on 04/22/2021) 10.2 g 11   LINZESS 290 MCG CAPS capsule TAKE 1 CAPSULE(290 MCG) BY MOUTH DAILY BEFORE BREAKFAST (Patient not taking: Reported on 04/22/2021) 30 capsule 1   predniSONE (DELTASONE) 20 MG tablet Take 2 tablets (40 mg total) by mouth daily. (Patient not taking: Reported on 04/22/2021) 10 tablet  0   acetaminophen (TYLENOL) 325 MG tablet Take 2 tablets (650 mg total) by mouth every 6 (six) hours as needed for mild pain or headache (fever >/= 101). 12 tablet 0   ARIPiprazole (ABILIFY) 5 MG tablet Take 5 mg by mouth at bedtime. (Patient not taking: Reported on 04/22/2021)     ascorbic acid (VITAMIN C) 500 MG tablet Take 1 tablet (500 mg total) by mouth daily. 30 tablet 2   mirtazapine (REMERON) 45 MG tablet Take 45 mg by mouth at bedtime. (Patient not taking: Reported on 04/22/2021)     mupirocin ointment (BACTROBAN) 2 % Apply topically 3 (three) times daily. (Patient not taking: Reported on 04/22/2021)     zinc sulfate 220  (50 Zn) MG capsule Take 1 capsule (220 mg total) by mouth daily. 30 capsule 1   No facility-administered medications prior to visit.    PAST MEDICAL HISTORY: Past Medical History:  Diagnosis Date   Anxiety    Arthritis    Asthma    Chronic back pain    Colon polyps    COPD (chronic obstructive pulmonary disease) (HCC)    Depression    Emphysema of lung (HCC)    Memory change    TIA (transient ischemic attack)    Tremor     PAST SURGICAL HISTORY: Past Surgical History:  Procedure Laterality Date   ABDOMINAL HYSTERECTOMY     CERVICAL SPINE SURGERY     INNER EAR SURGERY     LUMBAR FUSION  08/03/10   L3-L7    FAMILY HISTORY: Family History  Problem Relation Age of Onset   Depression Mother    Diabetes Mother    Hypertension Mother    Hyperlipidemia Mother    Heart attack Mother    Osteoarthritis Father    Asthma Father    COPD Father    Breast cancer Sister    Lupus Sister    Osteoarthritis Sister    Asthma Sister    COPD Sister    Diabetes Sister    Drug abuse Sister    Heart attack Sister    Heart attack Maternal Grandmother    Colon cancer Neg Hx    Esophageal cancer Neg Hx     SOCIAL HISTORY: Social History   Socioeconomic History   Marital status: Widowed    Spouse name: Not on file   Number of children: 1   Years of education: 70   Highest education level: 11th grade  Occupational History   Not on file  Tobacco Use   Smoking status: Former    Packs/day: 0.50    Types: Cigarettes    Quit date: 07/17/2020    Years since quitting: 0.7   Smokeless tobacco: Never   Tobacco comments:    Not smoking cigarettes but is vaping  04/22/21  Vaping Use   Vaping Use: Every day   Substances: Nicotine, Flavoring  Substance and Sexual Activity   Alcohol use: Not Currently   Drug use: Not Currently    Types: Other-see comments    Comment: CBD and medical marijuana- denies 8/28/2,04/22/21   Sexual activity: Not Currently  Other Topics Concern   Not on file   Social History Narrative   04/22/21 Lives alone   From [place], moved to [place] in 2019/07/06   Husband passed away in 2016-08-02   Social Determinants of Health   Financial Resource Strain: Not on file  Food Insecurity: Not on file  Transportation Needs: Not on file  Physical Activity:  Not on file  Stress: Not on file  Social Connections: Not on file  Intimate Partner Violence: Not on file     PHYSICAL EXAM  GENERAL EXAM/CONSTITUTIONAL: Vitals:  Vitals:   04/22/21 1029  BP: 139/79  Pulse: 89  Weight: 146 lb (66.2 kg)  Height: 4\' 11"  (1.499 m)   Body mass index is 29.49 kg/m. Wt Readings from Last 3 Encounters:  04/22/21 146 lb (66.2 kg)  02/06/21 147 lb (66.7 kg)  02/06/21 147 lb 6.1 oz (66.9 kg)   Patient is in no distress; well developed, nourished and groomed; neck is supple  CARDIOVASCULAR: Examination of carotid arteries is normal; no carotid bruits Regular rate and rhythm, no murmurs Examination of peripheral vascular system by observation and palpation is normal  EYES: Ophthalmoscopic exam of optic discs and posterior segments is normal; no papilledema or hemorrhages No results found.  MUSCULOSKELETAL: Gait, strength, tone, movements noted in Neurologic exam below  NEUROLOGIC: MENTAL STATUS:  MMSE - Mini Mental State Exam 04/22/2021  Orientation to time 5  Orientation to Place 4  Registration 3  Attention/ Calculation 3  Recall 2  Language- name 2 objects 2  Language- repeat 0  Language- follow 3 step command 3  Language- read & follow direction 1  Write a sentence 1  Copy design 1  Total score 25   awake, alert, oriented to person, place and time recent and remote memory intact normal attention and concentration language fluent, comprehension intact, naming intact fund of knowledge appropriate  CRANIAL NERVE:  2nd - no papilledema on fundoscopic exam 2nd, 3rd, 4th, 6th - pupils equal and reactive to light, visual fields full to  confrontation, extraocular muscles intact, no nystagmus 5th - facial sensation symmetric 7th - facial strength symmetric 8th - hearing intact 9th - palate elevates symmetrically, uvula midline 11th - shoulder shrug symmetric 12th - tongue protrusion midline  MOTOR:  normal bulk and tone, full strength in the BUE, BLE  SENSORY:  normal and symmetric to light touch, temperature, vibration  COORDINATION:  finger-nose-finger, fine finger movements normal  REFLEXES:  deep tendon reflexes TRACE and symmetric  GAIT/STATION:  narrow based gait     DIAGNOSTIC DATA (LABS, IMAGING, TESTING) - I reviewed patient records, labs, notes, testing and imaging myself where available.  Lab Results  Component Value Date   WBC 8.9 02/06/2021   HGB 12.7 02/06/2021   HCT 39.2 02/06/2021   MCV 90.1 02/06/2021   PLT 452.0 (H) 02/06/2021      Component Value Date/Time   NA 141 02/06/2021 0908   K 4.2 02/06/2021 0908   CL 102 02/06/2021 0908   CO2 31 02/06/2021 0908   GLUCOSE 96 02/06/2021 0908   BUN 6 02/06/2021 0908   CREATININE 0.65 02/06/2021 0908   CALCIUM 9.4 02/06/2021 0908   PROT 7.0 02/06/2021 0908   ALBUMIN 3.9 02/06/2021 0908   AST 18 02/06/2021 0908   ALT 9 02/06/2021 0908   ALKPHOS 108 02/06/2021 0908   BILITOT 0.5 02/06/2021 0908   GFRNONAA >60 07/13/2020 1400   GFRAA >60 01/18/2020 0934   Lab Results  Component Value Date   CHOL 179 02/06/2021   HDL 60.40 02/06/2021   LDLCALC 102 (H) 02/06/2021   TRIG 86.0 02/06/2021   CHOLHDL 3 02/06/2021   Lab Results  Component Value Date   HGBA1C 6.2 02/06/2021   Lab Results  Component Value Date   VITAMINB12 473 02/06/2021   Lab Results  Component Value Date  TSH 0.59 08/01/2019    02/06/21 MRI brain [I reviewed images myself and agree with interpretation. -VRP]  - Motion degraded study. No acute finding. Mild chronic small-vessel ischemic change of the cerebral  hemispheric white matter.  02/08/21 carotid u/s   - Color duplex indicates minimal heterogeneous plaque, with no hemodynamically significant stenosis by duplex criteria in the extracranial cerebrovascular circulation.    ASSESSMENT AND PLAN  65 y.o. year old female here with:   Dx:  1. Memory loss   2. Medication-induced postural tremor      PLAN:  MILD MEMORY LOSS (since 2-3 years; due to insomnia, chronic pain, depression and polypharmacy; under treatment currently) - mild changes; MMSE 25/30; no changes in ADLs - safety / supervision issues reviewed - daily physical activity / exercise (at least 15-30 minutes) - eat more plants / vegetables - increase social activities, brain stimulation, games, puzzles, hobbies, crafts, arts, music - aim for at least 7-8 hours sleep per night (or more) - avoid smoking and alcohol - caution with medications, finances, driving  TREMORS - resolved after stopping abilify, remeron   Return for return to PCP.    Penni Bombard, MD 80/06/2334, 12:24 AM Certified in Neurology, Neurophysiology and Neuroimaging  [facility] [address], [place], [place] [address] [phone]

## 2021-04-23 ENCOUNTER — Other Ambulatory Visit: Payer: Self-pay | Admitting: *Deleted

## 2021-04-23 ENCOUNTER — Other Ambulatory Visit: Payer: Self-pay | Admitting: Physician Assistant

## 2021-04-23 ENCOUNTER — Telehealth: Payer: Self-pay

## 2021-04-23 DIAGNOSIS — I739 Peripheral vascular disease, unspecified: Secondary | ICD-10-CM

## 2021-04-23 DIAGNOSIS — F17210 Nicotine dependence, cigarettes, uncomplicated: Secondary | ICD-10-CM

## 2021-04-23 DIAGNOSIS — Z87891 Personal history of nicotine dependence: Secondary | ICD-10-CM

## 2021-04-23 NOTE — Telephone Encounter (Signed)
FYI Returned call to [employer] and she stated that the results from the PAD testing are: left side: 0.71, right side: 1.10. Holly Phillips stated that [facility] would send over images/results by paper at a later date.

## 2021-04-23 NOTE — Telephone Encounter (Signed)
Spoke to patient and she agrees to see vein and vascular for evaluation.

## 2021-04-23 NOTE — Telephone Encounter (Signed)
Mavis from [facility] called stating that she would like to speak to a nurse about a PAD test that she did on [place]. She can be reached at  [phone]. Please Advise.

## 2021-04-29 ENCOUNTER — Ambulatory Visit
Admission: RE | Admit: 2021-04-29 | Discharge: 2021-04-29 | Disposition: A | Discharge status: Home / Self Care | Payer: Medicare Other | Source: Ambulatory Visit | Attending: Physician Assistant | Admitting: Physician Assistant

## 2021-04-29 DIAGNOSIS — R928 Other abnormal and inconclusive findings on diagnostic imaging of breast: Secondary | ICD-10-CM

## 2021-04-29 IMAGING — MG MM BREAST LOCALIZATION CLIP
4 series · 4 of 12 positions shown · non-contrast
Comparison: Previous exam(s).

CLINICAL DATA: Status post biopsy of LEFT breast mass.

EXAM:
3D DIAGNOSTIC LEFT MAMMOGRAM POST ULTRASOUND BIOPSY

[L CC synth-2D]
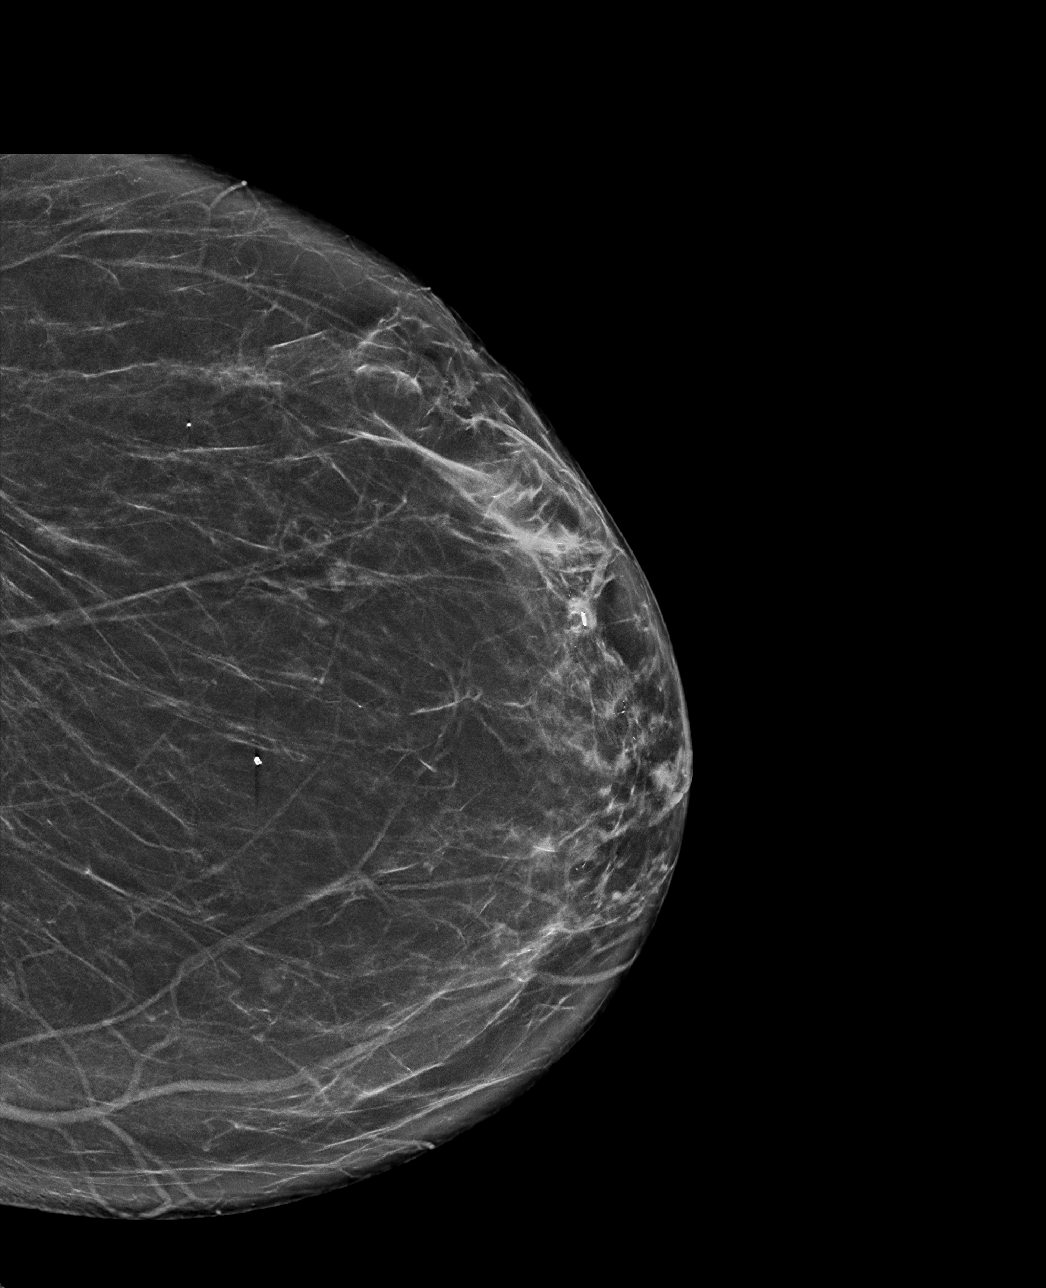

[L ML synth-2D]
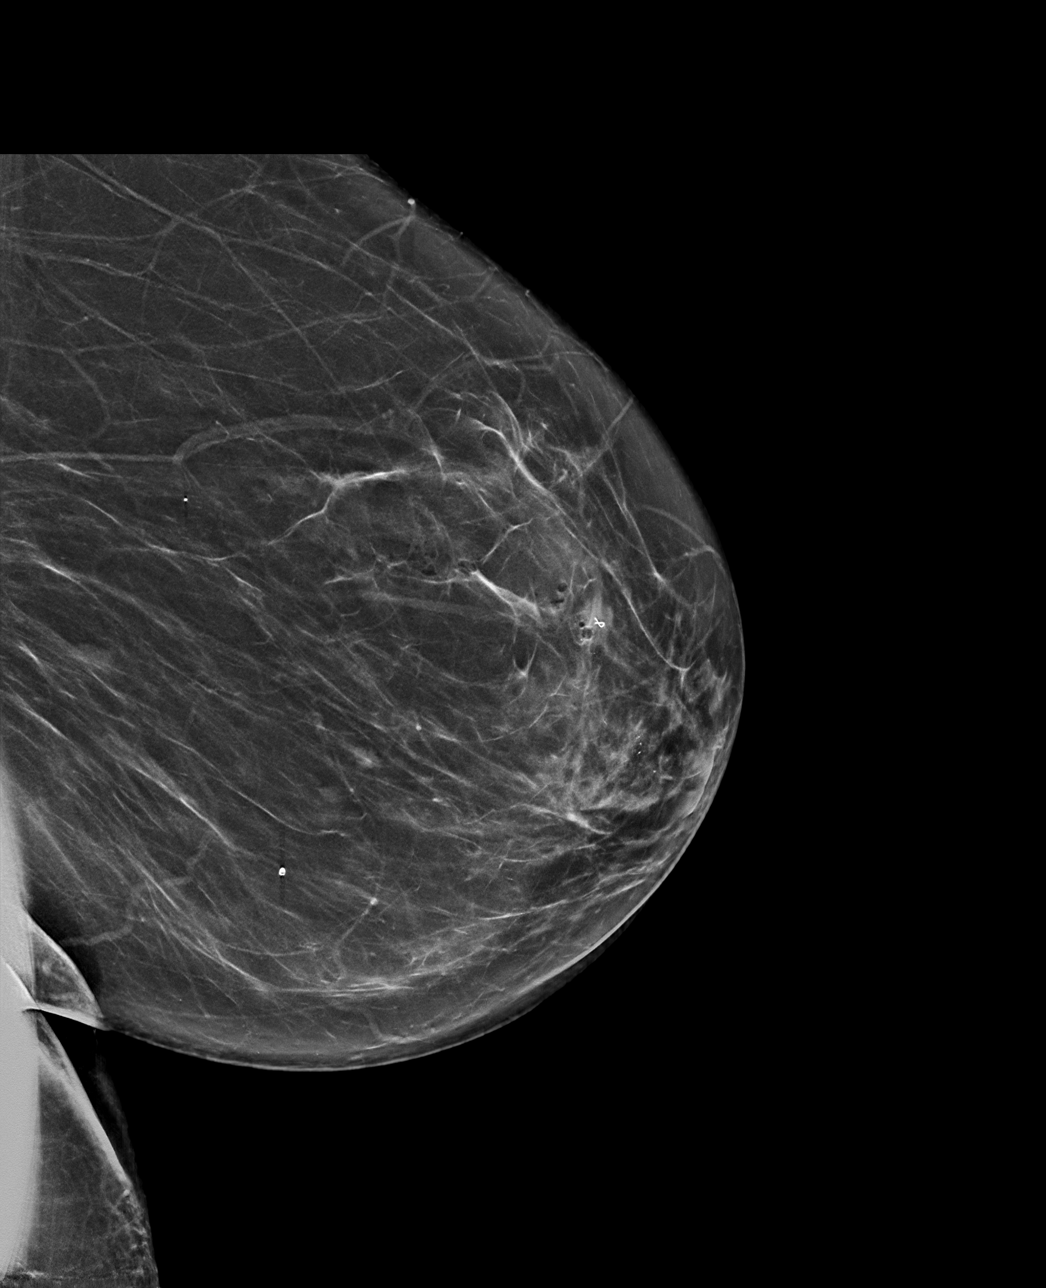

[L ML tomo · tomo slice 37/72.0]
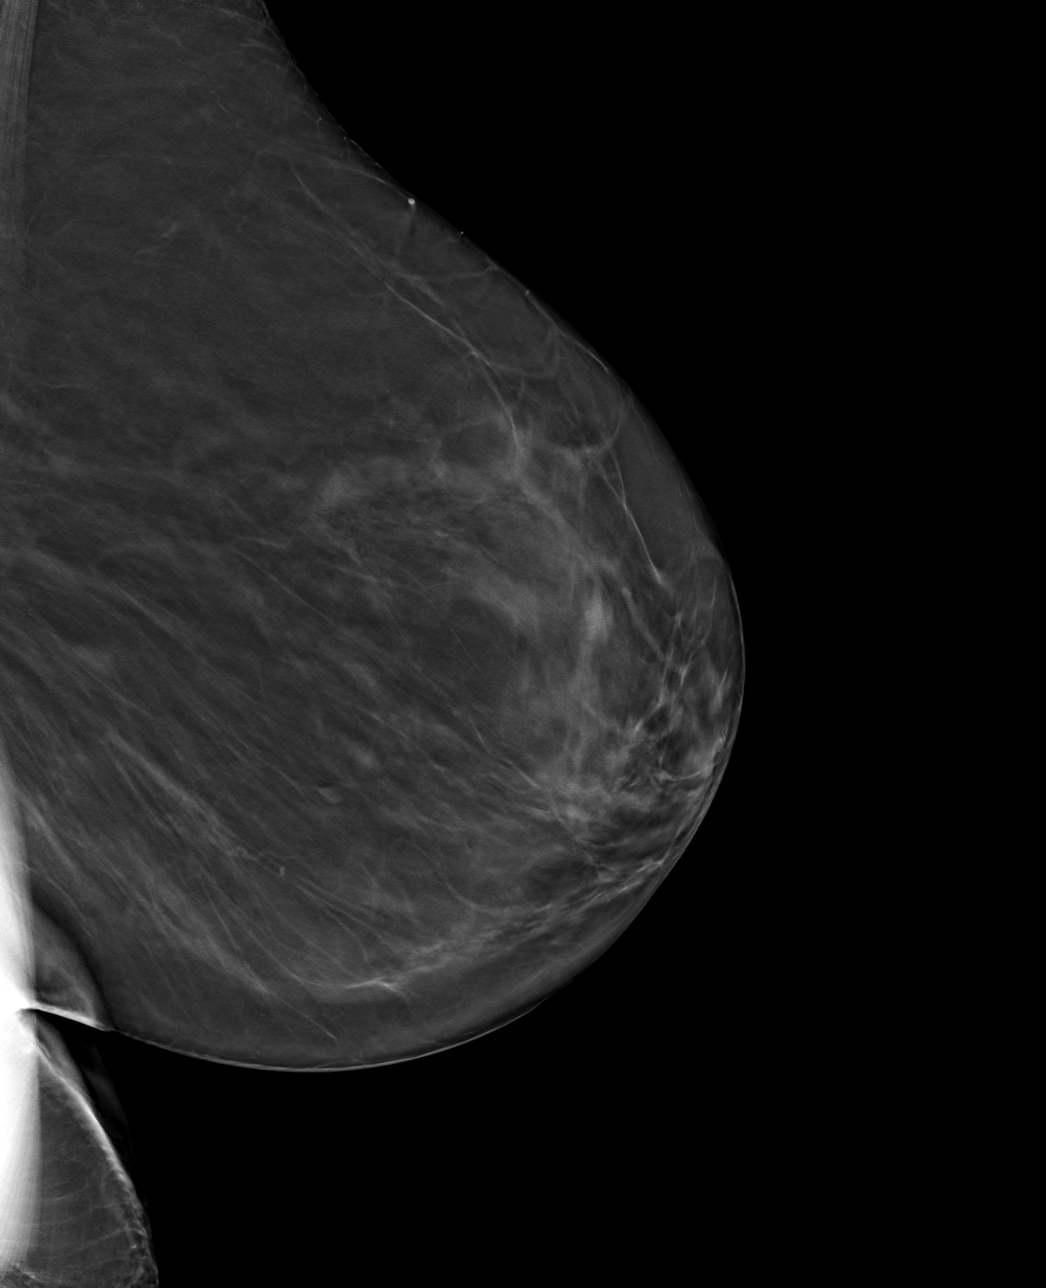

[L CC tomo · tomo slice 37/72.0]
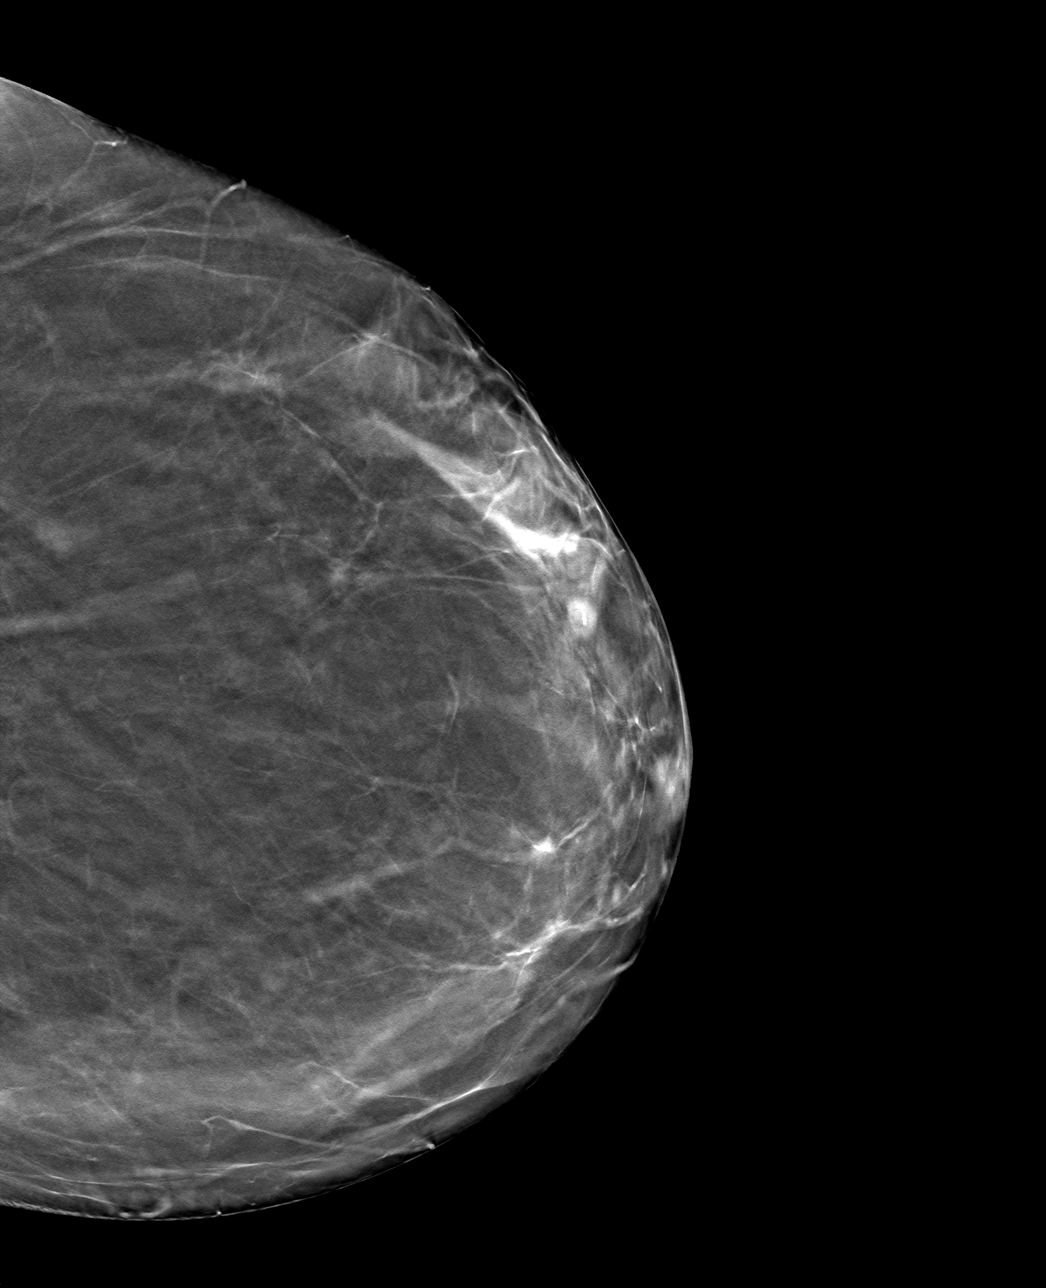

[4 of 12 positions shown; findings below may reference images not displayed]

FINDINGS: 3D Mammographic images were obtained following ultrasound guided
biopsy of mass in the 1 o'clock location of the LEFT breast and
placement of a ribbon shaped clip. The biopsy marking clip is in
expected position at the site of biopsy.
IMPRESSION: Appropriate positioning of the ribbon shaped biopsy marking clip at
the site of biopsy in the UPPER-OUTER QUADRANT LEFT breast mass.

Final Assessment: Post Procedure Mammograms for Marker Placement

## 2021-04-29 IMAGING — US US BREAST BX W LOC DEV 1ST LESION IMG BX SPEC US GUIDE*L*
1 series · 12 of 12 positions shown · non-contrast
Comparison: Previous exam(s).
COMPARISON: Previous exam(s).

Addendum:
CLINICAL DATA: Patient presents for ultrasound-guided core biopsy
of LEFT breast mass.

EXAM:
ULTRASOUND GUIDED LEFT BREAST CORE NEEDLE BIOPSY

[Series 1: us breast bx w loc dev 1st lesion img bx spec us g · 0.06mm/px · 12 of 12 slices shown]
[im 1/12]
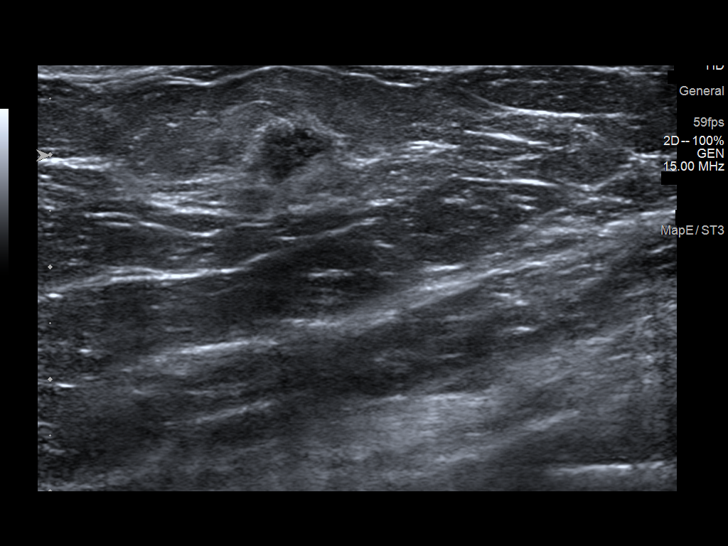
[im 2/12]
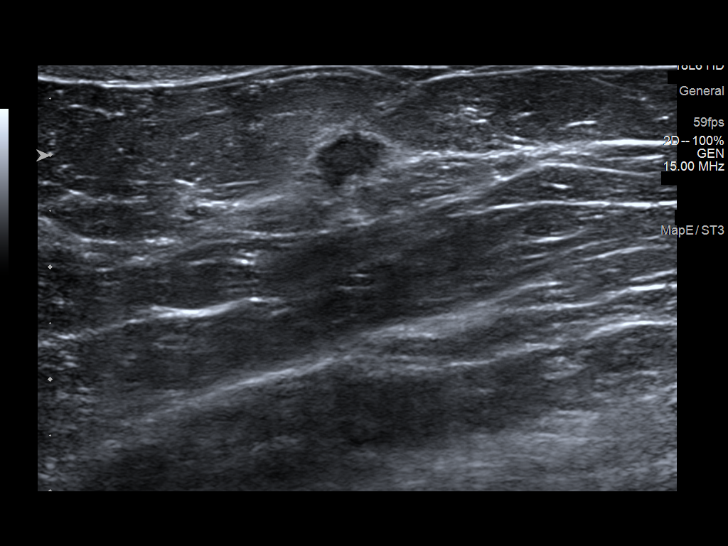
[im 3/12]
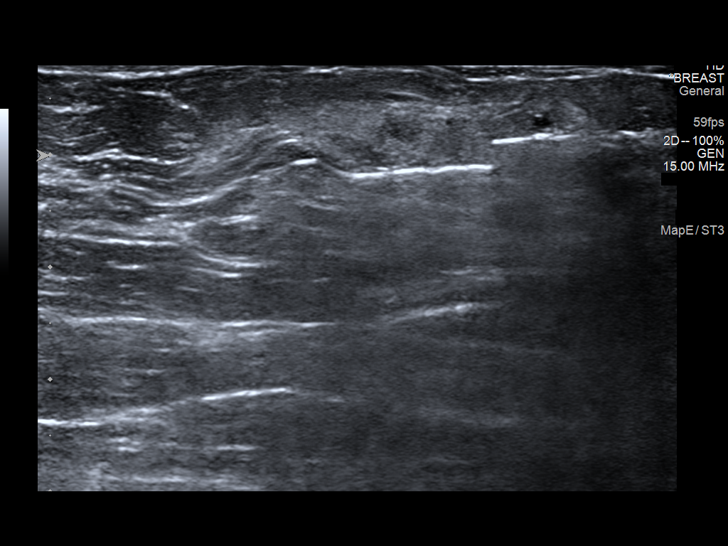
[im 4/12]
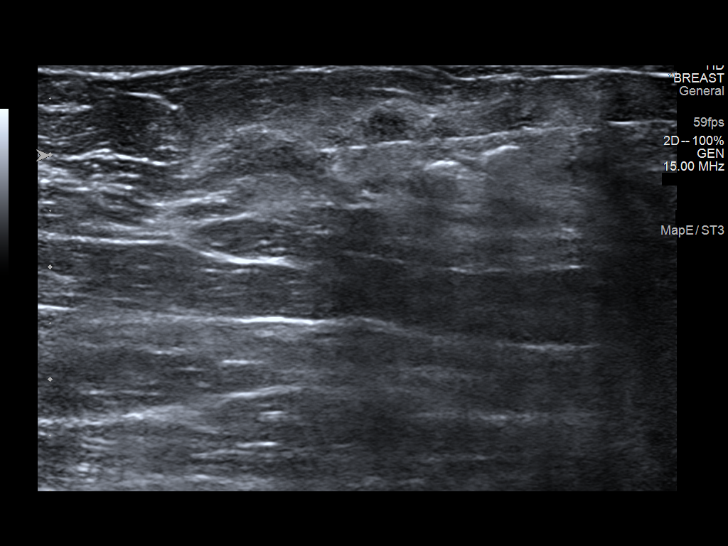
[im 5/12]
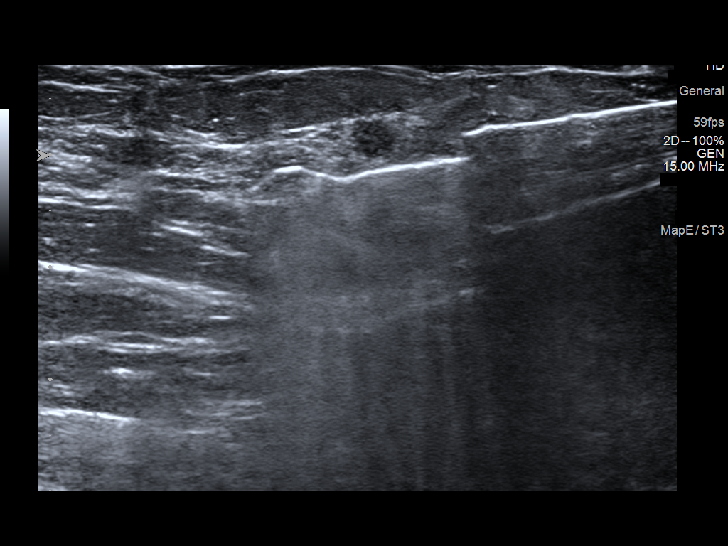
[im 6/12]
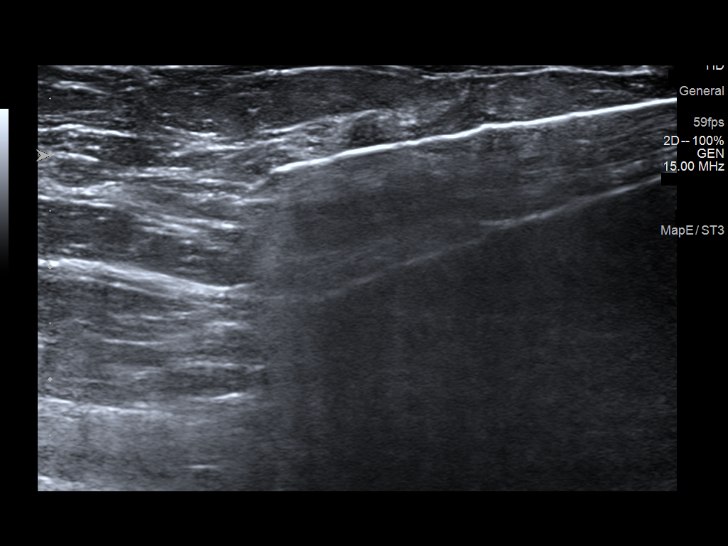
[im 7/12]
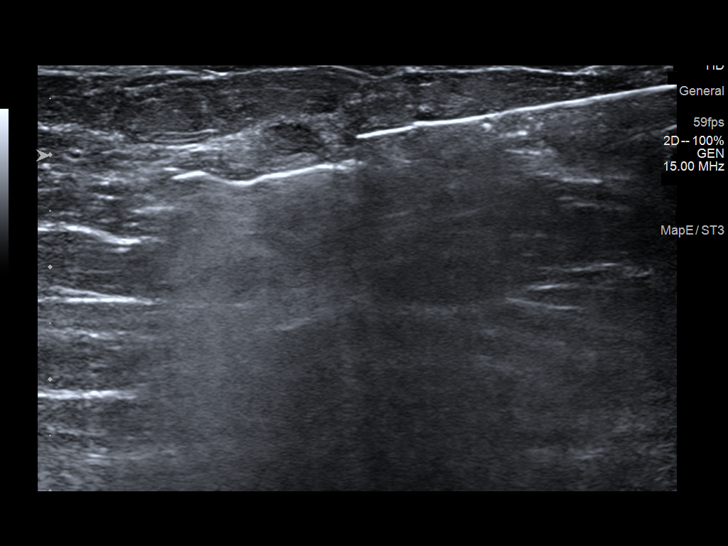
[im 8/12]
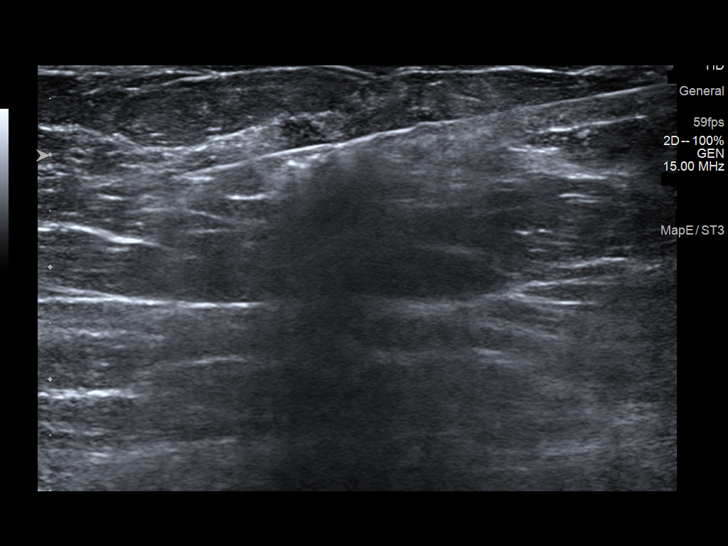
[im 9/12]
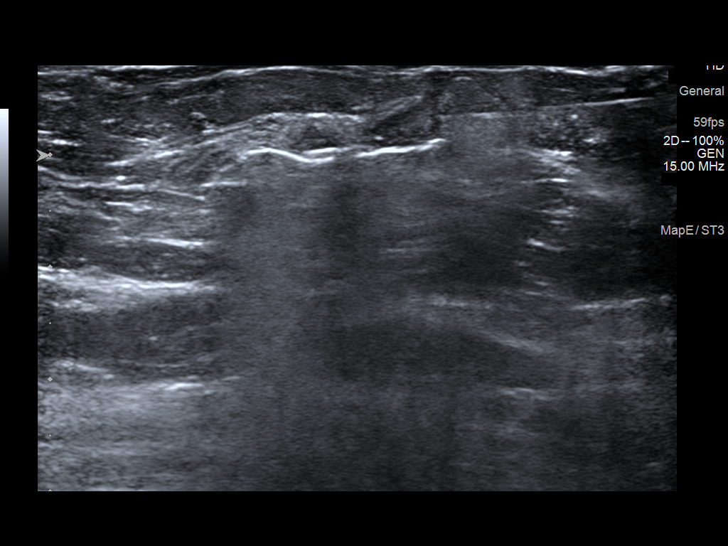
[im 10/12]
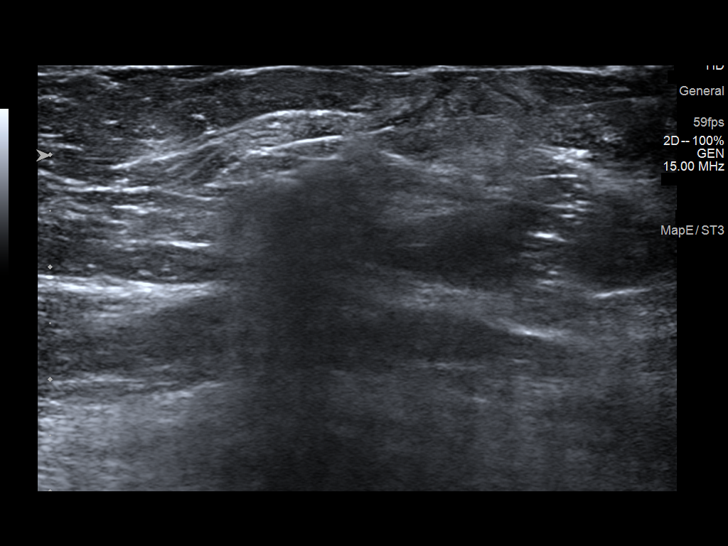
[im 11/12]
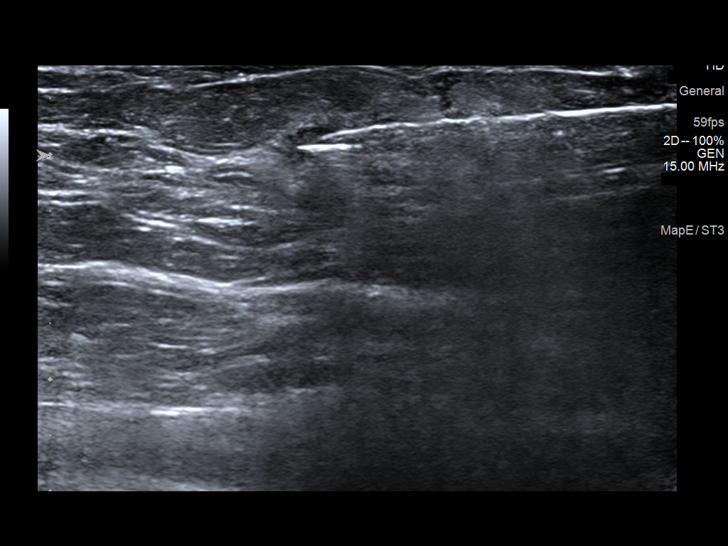
[im 12/12]
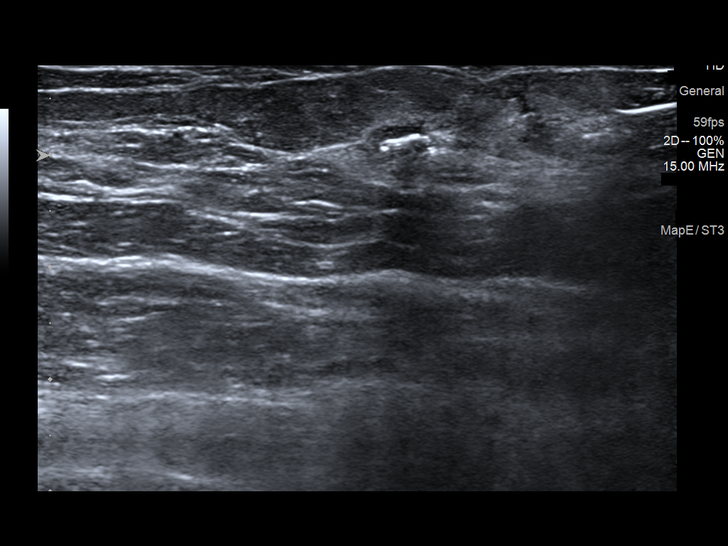

[12 of 12 positions shown; findings below may reference images not displayed]



Lesion quadrant: UPPER-OUTER QUADRANT LEFT breast

Using sterile technique and 1% Lidocaine as local anesthetic, under
direct ultrasound visualization, a 12 gauge KATE device was
used to perform biopsy of mass in the 1 o'clock location of the LEFT
breast using a LATERAL to approach. At the conclusion of the
procedure ribbon tissue marker clip was deployed into the biopsy
cavity. Follow up 2 view mammogram was performed and dictated
separately.
IMPRESSION: Ultrasound guided biopsy of LEFT breast mass. No apparent
complications.

ADDENDUM:
Pathology revealed GRADE III INVASIVE DUCTAL CARCINOMA of the LEFT
breast, 1 o'clock, upper outer quadrant, (ribbon clip). This was
found to be concordant by Dr. KATE.

Pathology results were discussed with the patient by telephone. The
patient reported doing well after the biopsy with tenderness at the
site. Post biopsy instructions and care were reviewed and questions
were answered. The patient was encouraged to call The [REDACTED] for any additional concerns. My direct phone
number was provided.

Surgical consultation has been arranged with Dr. KATE at
[HOSPITAL] on [DATE].

Pathology results reported by KATE, RN on [DATE].



Lesion quadrant: UPPER-OUTER QUADRANT LEFT breast

Using sterile technique and 1% Lidocaine as local anesthetic, under
direct ultrasound visualization, a 12 gauge KATE device was
used to perform biopsy of mass in the 1 o'clock location of the LEFT
breast using a LATERAL to approach. At the conclusion of the
procedure ribbon tissue marker clip was deployed into the biopsy
cavity. Follow up 2 view mammogram was performed and dictated
separately.
IMPRESSION: Ultrasound guided biopsy of LEFT breast mass. No apparent
complications.

## 2021-04-30 ENCOUNTER — Other Ambulatory Visit (HOSPITAL_COMMUNITY): Payer: Self-pay | Admitting: Vascular Surgery

## 2021-04-30 ENCOUNTER — Other Ambulatory Visit: Payer: Self-pay | Admitting: Family Medicine

## 2021-04-30 DIAGNOSIS — R6889 Other general symptoms and signs: Secondary | ICD-10-CM

## 2021-05-01 ENCOUNTER — Encounter: Payer: Medicare Other | Admitting: Vascular Surgery

## 2021-05-14 ENCOUNTER — Other Ambulatory Visit (HOSPITAL_COMMUNITY): Payer: Self-pay | Admitting: General Surgery

## 2021-05-14 ENCOUNTER — Encounter: Payer: Self-pay | Admitting: General Surgery

## 2021-05-14 ENCOUNTER — Other Ambulatory Visit: Payer: Self-pay

## 2021-05-14 ENCOUNTER — Ambulatory Visit (INDEPENDENT_AMBULATORY_CARE_PROVIDER_SITE_OTHER): Payer: Medicare Other | Admitting: General Surgery

## 2021-05-14 VITALS — BP 153/78 | HR 83 | Temp 98.5°F | Resp 18 | Ht 59.0 in | Wt 151.0 lb

## 2021-05-14 DIAGNOSIS — C50412 Malignant neoplasm of upper-outer quadrant of left female breast: Secondary | ICD-10-CM

## 2021-05-14 DIAGNOSIS — Z170 Estrogen receptor positive status [ER+]: Secondary | ICD-10-CM

## 2021-05-14 DIAGNOSIS — R928 Other abnormal and inconclusive findings on diagnostic imaging of breast: Secondary | ICD-10-CM

## 2021-05-14 NOTE — Progress Notes (Signed)
[facility] History and Physical ° °Reason for Referral: Left breast cancer °Referring Physician: Breast center  ° °Chief Complaint   °New Patient °  ° ° °Holly Phillips is a 65 y.o. female.  °HPI: Holly Phillips is a 65 yo with COPD who comes in with newly diagnosed breast cancer. She gets her regular mammograms, and says she has never had an abnormal mammogram. She says in her 20s she has a biopsy of the right breast that came back as a fibrocystic area.  ° °The patient has no history of any masses, lumps, bumps, nipple changes or discharge. She had menarche at age 12, and her first pregnancy at age 17.  She has no history of any family breast cancer.  She had a hysterectomy at 19. She has never had any previous biopsies or concerning areas on mammogram.  She has not had any chest radiation. ° °She is here today with her daughter.  ° °Past Medical History:  °Diagnosis Date  ° Anxiety   ° Arthritis   ° Asthma   ° Chronic back pain   ° Colon polyps   ° COPD (chronic obstructive pulmonary disease) (HCC)   ° Depression   ° Emphysema of lung (HCC)   ° Memory change   ° TIA (transient ischemic attack)   ° Tremor   ° ° °Past Surgical History:  °Procedure Laterality Date  ° ABDOMINAL HYSTERECTOMY    ° CERVICAL SPINE SURGERY    ° INNER EAR SURGERY    ° LUMBAR FUSION  2012  ° L3-L7  ° ° °Family History  °Problem Relation Age of Onset  ° Depression Mother   ° Diabetes Mother   ° Hypertension Mother   ° Hyperlipidemia Mother   ° Heart attack Mother   ° Osteoarthritis Father   ° Asthma Father   ° COPD Father   ° Breast cancer Sister   ° Lupus Sister   ° Osteoarthritis Sister   ° Asthma Sister   ° COPD Sister   ° Diabetes Sister   ° Drug abuse Sister   ° Heart attack Sister   ° Heart attack Maternal Grandmother   ° Colon cancer Neg Hx   ° Esophageal cancer Neg Hx   ° ° °Social History  ° °Tobacco Use  ° Smoking status: Former  °  Packs/day: 0.50  °  Types: Cigarettes  °  Quit date: 07/17/2020  °  Years  since quitting: 0.8  ° Smokeless tobacco: Never  ° Tobacco comments:  °  Not smoking cigarettes but is vaping  04/22/21  °Vaping Use  ° Vaping Use: Every day  ° Substances: Nicotine, Flavoring  °Substance Use Topics  ° Alcohol use: Not Currently  ° Drug use: Not Currently  °  Types: Other-see comments  °  Comment: CBD and medical marijuana- denies 8/28/2,04/22/21  ° ° °Medications: I have reviewed the patient's current medications. °Allergies as of 05/14/2021   ° °   Reactions  ° Mucinex [guaifenesin Er] Other (See Comments)  ° Jerky movements  ° °  ° °  °Medication List  °  ° °  ° Accurate as of May 14, 2021  9:24 AM. If you have any questions, ask your nurse or doctor.  °  °  ° °  ° °STOP taking these medications   ° °Linzess 290 MCG Caps capsule °Generic drug: linaclotide °Stopped by: [place] Strieter C Brina Umeda, MD °  °montelukast 10 MG tablet °Commonly known as: SINGULAIR °Stopped by: Mattia Osterman   C Jayelyn Barno, MD °  ° °  ° °TAKE these medications   ° °albuterol (2.5 MG/3ML) 0.083% nebulizer solution °Commonly known as: PROVENTIL °Take 3 mLs (2.5 mg total) by nebulization every 6 (six) hours as needed for wheezing or shortness of breath. °  °albuterol 108 (90 Base) MCG/ACT inhaler °Commonly known as: VENTOLIN HFA °INHALE 1 TO 2 PUFFS INTO THE LUNGS EVERY 6 HOURS AS NEEDED FOR WHEEZING OR SHORTNESS OF BREATH °  °atorvastatin 20 MG tablet °Commonly known as: LIPITOR °Take 1 tablet (20 mg total) by mouth daily. °  °budesonide-formoterol 160-4.5 MCG/ACT inhaler °Commonly known as: SYMBICORT °Inhale 2 puffs into the lungs 2 (two) times daily. °  °celecoxib 100 MG capsule °Commonly known as: CELEBREX °TAKE 1 CAPSULE(100 MG) BY MOUTH DAILY °  °cyclobenzaprine 10 MG tablet °Commonly known as: FLEXERIL °Take 1 tablet (10 mg total) by mouth 3 (three) times daily. °  °escitalopram 20 MG tablet °Commonly known as: LEXAPRO °Take 20 mg by mouth daily. °  °furosemide 20 MG tablet °Commonly known as: LASIX °Take 1 tablet (20 mg total) by  mouth daily as needed for edema. °  °gabapentin 800 MG tablet °Commonly known as: NEURONTIN °TAKE 1 TABLET(800 MG) BY MOUTH THREE TIMES DAILY °  °hydrOXYzine 50 MG capsule °Commonly known as: VISTARIL °Take 50 mg by mouth 4 (four) times daily. °  °lidocaine 5 % °Commonly known as: LIDODERM °Place 3 patches onto the skin daily as needed (pain). °  °omeprazole 40 MG capsule °Commonly known as: PRILOSEC °Take 1 capsule (40 mg total) by mouth 2 (two) times daily. °  °oxyCODONE-acetaminophen 10-325 MG tablet °Commonly known as: PERCOCET °Take 1 tablet by mouth 4 (four) times daily as needed. °  °predniSONE 20 MG tablet °Commonly known as: DELTASONE °Take 2 tablets (40 mg total) by mouth daily. °  °tiZANidine 4 MG tablet °Commonly known as: ZANAFLEX °Take 4 mg by mouth 3 (three) times daily. °  °traZODone 100 MG tablet °Commonly known as: DESYREL °Take 200 mg by mouth at bedtime. °  ° °  ° ° ° °ROS:  °A comprehensive review of systems was negative except for: Respiratory: positive for wheezing and SOB °Musculoskeletal: positive for back pain, neck pain, and joint pain ° °Blood pressure (!) 153/78, pulse 83, temperature 98.5 °F (36.9 °C), temperature source Oral, resp. rate 18, height 4' 11" (1.499 m), weight 151 lb (68.5 kg), SpO2 98 %. °Physical Exam °Vitals reviewed.  °Constitutional:   °   Appearance: Normal appearance.  °HENT:  °   Head: Atraumatic.  °   Mouth/Throat:  °   Mouth: Mucous membranes are moist.  °Eyes:  °   Extraocular Movements: Extraocular movements intact.  °Cardiovascular:  °   Rate and Rhythm: Normal rate and regular rhythm.  °Pulmonary:  °   Effort: Pulmonary effort is normal.  °   Breath sounds: Normal breath sounds.  °Chest:  °Breasts: °   Right: No inverted nipple, mass, nipple discharge or skin change.  °   Left: No inverted nipple, mass, nipple discharge or skin change.  °   Comments: Left breast with evolving bruise from biopsy °Abdominal:  °   Palpations: Abdomen is soft.  °   Tenderness:  There is no abdominal tenderness.  °Musculoskeletal:     °   General: Normal range of motion.  °   Cervical back: Normal range of motion.  °Lymphadenopathy:  °   Cervical:  °   Right cervical: No superficial cervical   adenopathy. °   Left cervical: No superficial cervical adenopathy.  °   Upper Body:  °   Right upper body: No supraclavicular or axillary adenopathy.  °   Left upper body: No supraclavicular or axillary adenopathy.  °Skin: °   General: Skin is warm.  °Neurological:  °   General: No focal deficit present.  °   Mental Status: She is alert and oriented to person, place, and time.  °Psychiatric:     °   Mood and Affect: Mood normal.     °   Behavior: Behavior normal.     °   Thought Content: Thought content normal.     °   Judgment: Judgment normal.  ° ° °Results: ° °CLINICAL DATA:  Screening. °  °EXAM: °DIGITAL SCREENING BILATERAL MAMMOGRAM WITH TOMOSYNTHESIS AND CAD °  °TECHNIQUE: °Bilateral screening digital craniocaudal and mediolateral oblique °mammograms were obtained. Bilateral screening digital breast °tomosynthesis was performed. The images were evaluated with °computer-aided detection. °  °COMPARISON:  None. °  °ACR Breast Density Category b: There are scattered areas of °fibroglandular density. °  °FINDINGS: °In the right breast an asymmetry requires further evaluation. °  °In the left breast a possible mass requires further evaluation. °  °IMPRESSION: °Further evaluation is suggested for an asymmetry in the right °breast. °  °Further evaluation is suggested for a possible mass in the left °breast. °  °RECOMMENDATION: °Diagnostic mammogram and possibly ultrasound of both breasts. °(Code:[id number]-00M) °  °The patient will be contacted regarding the findings, and additional °imaging will be scheduled. °  °BI-RADS CATEGORY  0: Incomplete. Need additional imaging evaluation °and/or prior mammograms for comparison. °  °  °Electronically Signed °  By: Michelle  Collins M.D. °  On: 03/14/2021 14:47 °   °CLINICAL DATA:  65-year-old female presenting as a recall from °baseline exam for possible right breast asymmetry and possible left °breast mass. °  °EXAM: °DIGITAL DIAGNOSTIC BILATERAL MAMMOGRAM WITH TOMOSYNTHESIS AND CAD; °ULTRASOUND LEFT BREAST LIMITED °  °TECHNIQUE: °Bilateral digital diagnostic mammography and breast tomosynthesis °was performed. The images were evaluated with computer-aided °detection.; Targeted ultrasound examination of the left breast was °performed. °  °COMPARISON:  Previous exam(s). °  °ACR Breast Density Category b: There are scattered areas of °fibroglandular density. °  °FINDINGS: °Mammogram: °  °Right breast: Spot compression tomosynthesis MLO and full field mL °tomosynthesis views of the right breast performed for an asymmetry °seen only on MLO view in the superior right breast. On the °additional imaging the tissue in this area disperses without °persistent asymmetry, mass, or distortion. °  °Left breast: Spot compression tomosynthesis views of the left breast °were performed demonstrating persistence of a 0.9 cm irregular mass °in the upper outer left breast anterior depth with a few associated °calcifications. °  °Ultrasound: °  °Targeted ultrasound performed in the left breast at 1 o'clock 2 cm °from the nipple demonstrating an irregular hypoechoic mass measuring °0.8 x 0.7 x 0.8 cm. This corresponds to the mammographic finding. °  °Targeted ultrasound of the left axilla demonstrates normal lymph °nodes. °  °IMPRESSION: °1. Suspicious mass in the left breast at 1 o'clock measuring 0.8 cm. °  °2.  Resolution of the questioned asymmetry in the right breast. °  °RECOMMENDATION: °Ultrasound-guided core needle biopsy of the left breast mass at 1 °o'clock. °  °I have discussed the findings and recommendations with the patient °who agrees to proceed with biopsy. The patient will be scheduled for °the biopsy appointment prior to   leaving the office today. °  °BI-RADS CATEGORY  4:  Suspicious. °  °  °Electronically Signed °  By: Nancy  Ballantyne M.D. 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 This was °found to be concordant by Dr. Elizabeth Brown. °  °Pathology results were discussed with the patient by telephone. The °patient reported doing well after the biopsy with tenderness at the °site. Post biopsy instructions and care were reviewed and questions °were answered. The patient was encouraged to call [facility] for any additional concerns. My direct phone °number was provided. °  °Surgical consultation has been arranged with Dr. Tarquin Welcher at °[facility] on May 14, 2021. °  °Pathology results reported by Lynne Bailey, RN on 05/02/2021. °CLINICAL DATA:  Patient presents for ultrasound-guided core biopsy °of LEFT breast mass. °  °EXAM: °ULTRASOUND GUIDED LEFT BREAST CORE NEEDLE BIOPSY °  °COMPARISON:  Previous exam(s). °  °PROCEDURE: °I met with the patient and we discussed the procedure of °ultrasound-guided biopsy, including benefits and alternatives. We °discussed the high likelihood of a successful procedure. We °discussed the risks of the procedure, including infection, bleeding, °tissue injury, clip migration, and inadequate sampling. Informed °written consent was given. The usual time-out protocol was performed °immediately prior to the procedure. °  °Lesion quadrant: UPPER-OUTER QUADRANT LEFT breast °  °Using sterile technique and 1% Lidocaine as local anesthetic, under °direct ultrasound visualization, a 12 gauge spring-loaded device was °used to perform biopsy of mass in the 1 o'clock location of the LEFT °breast using a LATERAL to approach. At the conclusion of the °procedure ribbon tissue marker clip was deployed into the biopsy °cavity. Follow up 2 view  mammogram was performed and dictated °separately. °  °IMPRESSION: °Ultrasound guided biopsy of LEFT breast mass. No apparent °complications. °  °Electronically Signed: °By: Elizabeth  Brown M.D. °On: 04/29/2021 08:13 °  °Assessment & Plan:  °Demecia Smiddy is a 65 y.o. female with a left breast cancer. ° °-We have discussed the options for surgery including the option of mastectomy with sentinel node biopsy versus partial mastectomy (lumpectomy) with sentinel node biopsy. We have discussed that there is no difference in the prognosis or chance or recurrence or differences in survival between the two options. We have discussed the need for radiation with the lumpectomy, and we have discussed that she will be referred to oncology after our procedure to further discuss her options for chemotherapy and hormonal therapy if she qualifies.  ° °We have discussed that if she decides to have a lumpectomy that we will need to get a needle placed into the area where the biopsy was performed, since we cannot palpate a mass. We have also discussed the need for injection of radiotracer and blue dye to perform the sentinel node biopsy. ° °We have discussed that the sentinel node biopsy tells us if the cancer has spread to the lymph nodes, and can help with plans for chemotherapy treatment and overall prognosis.   ° °We have discussed that if the lumpectomy does not remove the entire cancer that she may have to have an additional procedure, and we have discussed that a positive sentinel node can require further removal of lymph nodes from the axilla but that recent research does not show any improvement in disease free survival and carries greater risk for lymphedema.   ° °  We have discussed that these are big discussions, and that the risk from the operations are similar including risk of bleeding, risk of infection, and risk of needing additional surgeries. We have discussed the likely need for an overnight stay with a  mastectomy and a drain that will remain in place for about 1 week.   ° °Will proceed with tag placement and lumpectomy and sentinel node biopsy.  ° °All questions were answered to the satisfaction of the patient and family. ° ° ° ° °Angelica Frandsen C Hilarie Sinha °05/14/2021, 9:24 AM  ° °

## 2021-05-14 NOTE — Patient Instructions (Signed)
Surgical Options for Early-Stage Breast Cancer Surgery is usually the first treatment for early-stage breast cancer. Most women have two surgery options. One is called partial mastectomy, or breast-sparing or breast-conserving surgery, and the other is called mastectomy. Both surgeries have good survival rates. Breast cancer is different for everyone, even in its early stage. The best treatment for one person might not be the best treatment for another. Learn as much as you can about your cancer and work closely with your health care providers to make the choice that produces the best results for you. What is partial mastectomy? Partial mastectomy, also called breast-sparing surgery or breast-conserving surgery, is surgery to remove the cancer along with some normal breast tissue that surrounds it. Lymph nodes from under the arm may also be removed and tested to find out if the cancer has spread. If cancer is located near the chest wall, part of the chest wall lining may also be removed. What is a mastectomy? A mastectomy is surgery to remove the cancer along with the entire breast tissue. There are several types of mastectomy: Simple or total mastectomy. In this surgery the entire breast is removed, including breast tissue, nipple, areola and skin around the breast. Some lymph nodes may also be removed from under the arm. If cancer is located near the chest wall, part of the chest wall lining may also be removed. Skin-sparing mastectomy. In this surgery the breast tissue, nipple, and areola are removed and most of the skin over the breast is left in place. This surgery results in less scar tissue than other mastectomy surgeries, which allows for a more natural breast reconstruction. Nipple-sparing mastectomy. In this surgery, breast tissue is removed but the skin and nipple is left in place. The tissue under the nipple and areola may be removed if cancer is found in the area. This may be an option for  women who choose to have breast reconstruction after mastectomy. Modified radical mastectomy. This surgery is the same as a simple mastectomy but also includes removing lymph nodes from under the arm (axillary lymph node dissection). Radical mastectomy. In this surgery the entire breast, the lymph nodes under the arm, and the chest wall muscles under the breast are removed. This surgery is rarely done now. A modified radical mastectomy is preferred because it is just as effective, but with the added advantage of fewer side effects. What are some advantages and disadvantages of these surgeries? Partial mastectomy Advantages of partial mastectomy include: Keeping most of your breast tissue intact, allowing for a more natural look to the breast. Easier recovery when compared to a mastectomy. Ability to go home on the day of the procedure. Disadvantages of partial mastectomy include: Slightly higher risk that your cancer will come back. Needing more surgery at a later time. Requiring radiation therapy after surgery, which has side effects and possible complications. This is done to reduce the chances of breast cancer returning. Mastectomy Advantages of a mastectomy include: Not needing to have radiation therapy or other treatments after surgery. Lower chances of your cancer coming back. Disadvantages of a mastectomy include: Longer recovery time compared to partial mastectomy. Possibility of more complications. Requiring additional surgeries to reconstruct your breast. Questions to ask Here are some questions to ask about each surgery: What will my recovery be like? How will my breast look and feel? What are the possible risks and complications of the surgery? What additional treatment might I need after surgery? What are the risks and complications of  radiation therapy? What are the risks and complications of chemotherapy? Will I be able to have breast reconstruction? Where to find more  information [place]: https://www.cancer.gov American Cancer Society: http://www.cancer.org Summary Surgery is usually the first treatment for early-stage breast cancer. Most women have two surgery options. One option is called partial mastectomy, or breast-sparing or breast-conserving surgery, and the other is called mastectomy. Both surgeries have good survival rates. Each option has advantages and disadvantages to consider. The best treatment for one person might not be the best treatment for you. Learn as much as you can about your cancer and work closely with your health care providers to make the choice that produces the best results for you. This information is not intended to replace advice given to you by your health care provider. Make sure you discuss any questions you have with your health care provider.  Lumpectomy A lumpectomy, sometimes called a partial mastectomy, is surgery to remove a cancerous tumor or mass (the lump) from a breast. It is a form of breast-conserving or breast-preservation surgery. This means that the cancerous tissue is removed but the breast remains intact. During a lumpectomy, the portion of the breast that contains the tumor is removed. Some normal tissue around the lump may be taken out to make sure that all of the tumor has been removed. Lymph nodes under your arm may also be removed and tested to find out if the cancer has spread. Lymph nodes are part of the body's disease-fighting system (immune system) and are usually the first place where breast cancer spreads. Tell a health care provider about: Any allergies you have. All medicines you are taking, including vitamins, herbs, eye drops, creams, and over-the-counter medicines. Any problems you or family members have had with anesthetic medicines. Any blood disorders you have. Any surgeries you have had. Any medical conditions you have. Whether you are pregnant or may be pregnant. What  are the risks? Generally, this is a safe procedure. However, problems may occur, including: Bleeding. Infection. Allergic reaction to medicines. Pain, swelling, weakness, or numbness in the arm on the side of your surgery. Temporary swelling. Change in the shape of the breast, particularly if a large portion is removed. Scar tissue that forms at the surgical site and feels hard to the touch. Blood clots. Medicines Ask your health care provider about: Changing or stopping your regular medicines. This is especially important if you are taking diabetes medicines or blood thinners. Taking medicines such as aspirin and ibuprofen. These medicines can thin your blood. Do not take these medicines unless your health care provider tells you to take them. Taking over-the-counter medicines, vitamins, herbs, and supplements. General instructions Prior to surgery, your health care provider may do a procedure to locate and mark the tumor area in your breast (localization). This will help guide your surgeon to where the incision will be made. This may be done with: Imaging, such as a mammogram, ultrasound, or MRI. Insertion of a small wire, clip, or seed, or an implant that will reflect a radar signal. You may have screening tests or exams to get baseline measurements of your arm. These can be compared to measurements done after surgery to monitor for swelling (lymphedema) that can develop after having lymph nodes removed. Ask your health care provider: How your surgery site will be marked. What steps will be taken to help prevent infection. These may include: Washing skin with a germ-killing soap. Taking antibiotic medicine. Plan to have someone take you  home from the hospital or clinic. Plan to have a responsible adult care for you for at least 24 hours after you leave the hospital or clinic. This is important. What happens during the procedure?  An IV will be inserted into one of your veins. You  will be given one or more of the following: A medicine to help you relax (sedative). A medicine to numb the area (local anesthetic). A medicine to make you fall asleep (general anesthetic). Your health care provider will use a kind of electric scalpel that uses heat to reduce bleeding (electrocautery knife). A curved incision that follows the natural curve of your breast will be made. This type of incision will allow for minimal scarring and better healing. The tumor will be removed along with some of the tissue around it. This will be sent to the lab for testing. Your health care provider may also remove lymph nodes at this time if needed. If the tumor is close to the muscles over your chest, some muscle tissue may also be removed. A small drain tube may be inserted into your breast area or armpit to collect fluid that may build up after surgery. This tube will be connected to a suction bulb on the outside of your body to remove the fluid. The incision will be closed with stitches (sutures). A bandage (dressing) may be placed over the incision. The procedure may vary among health care providers and hospitals. What happens after the procedure? Your blood pressure, heart rate, breathing rate, and blood oxygen level will be monitored until you leave the hospital or clinic. You will be given medicine for pain as needed. Your IV will be removed when you are able to eat and drink by mouth. You will be encouraged to get up and walk as soon as you can. This is important to improve blood flow and breathing. Ask for help if you feel weak or unsteady. You may have: A drain tube in place for 2-3 days to prevent a collection of blood (hematoma) from developing in the breast. You will be given instructions about caring for the drain before you go home. A pressure bandage applied for 1-2 days to prevent bleeding or swelling. Your pressure bandage may look like a thick piece of fabric or an elastic wrap. Ask your  health care provider how to care for your bandage at home. You may be given a tight sleeve to wear over your arm on the side of your surgery. You should wear this sleeve as told by your health care provider. Do not drive for 24 hours if you were given a sedative during your procedure. Summary A lumpectomy, sometimes called a partial mastectomy, is surgery to remove a cancerous tumor or mass (the lump) from a breast. During a lumpectomy, the portion of the breast that contains the tumor is removed. Lymph nodes under your arm may also be removed and tested to find out if the cancer has spread. Plan to have someone take you home from the hospital or clinic. You may have a drain tube in place for 2-3 days to prevent a collection of blood (hematoma) from developing in the breast. You will be given instructions about caring for the drain before you go home. This information is not intended to replace advice given to you by your health care provider. Make sure you discuss any questions you have with your health care provider. Document Revised: 11/08/2018 Document Reviewed: 11/08/2018 Elsevier Patient Education  [place]  Lymph Node Biopsy A sentinel lymph node biopsy is a procedure to identify, remove, and check a lymph node for cancer. Lymph is excess fluid from the tissues in your body. It is removed through the lymphatic system. This system is also a part of your body's defense system (immune system). The system includes lymph nodes and lymph vessels. Certain types of cancer can spread to nearby lymph nodes through the lymphatic system. The cancer usually spreads to one lymph node first, and then to others. The first lymph node that the cancer could spread to is called the sentinel lymph node. In some cases, there may be more than one sentinel lymph node. You may have this procedure to determine whether your cancer has spread and to help your health care provider plan your treatment. If  no cancer is found in the sentinel lymph node, it is very unlikely that the cancer has spread to any of your other lymph nodes. If cancer is found in the sentinel lymph node, additional lymph nodes may be removed for testing. Tell a health care provider about: Any allergies you have, including any history of problems with contrast dye. All medicines you are taking, including vitamins, herbs, eye drops, creams, and over-the-counter medicines. Any problems you or family members have had with anesthetic medicines. Any blood disorders you have. Any surgeries you have had. Any medical conditions you have. Whether you are pregnant or may be pregnant. What are the risks? Generally, this is a safe procedure. However, problems may occur, including: Infection. Bleeding. Allergic reaction to medicines or dyes. Staining of the skin where dye is injected. Damaged lymph vessels, causing a buildup of fluid (lymphedema). Pain or bruising at the biopsy site. What happens before the procedure? Medicines Ask your health care provider about: Changing or stopping your regular medicines. This is especially important if you are taking diabetes medicines or blood thinners. Taking medicines such as aspirin and ibuprofen. These medicines can thin your blood. Do not take these medicines before your procedure unless your health care provider instructs you to do so. Taking over-the-counter medicines, vitamins, herbs, and supplements. Surgery safety Ask your health care provider: How your surgery site will be marked. What steps will be taken to help prevent infection. These steps may include: Removing hair at the surgery site. Washing skin with a germ-killing soap. Receiving antibiotic medicine. General instructions Do not use any products that contain nicotine or tobacco for at least 2 weeks before the procedure. These products include cigarettes, chewing tobacco, and vaping devices, such as e-cigarettes. If you  need help quitting, ask your health care provider. You may have blood tests to make sure your blood clots normally. Plan to have a responsible adult take you home from the hospital or clinic. What happens during the procedure?  An IV will be inserted into one of your veins. You will be given one or more of the following: A medicine to help you relax (sedative). A medicine to numb the area (local anesthetic). A medicine to make you fall asleep (general anesthetic). Blue dye, a radioactive substance, or both will be injected around the tumor. Both the dye and the radioactive substance will follow the same path that a spreading cancer would likely follow. The blue dye will reach your lymph nodes quickly. It may be given just before surgery. The radioactive substance will take longer to reach your lymph nodes. It may be given 2-24 hours before surgery, depending on your hospital. If a radioactive substance was injected,  a scanner will show where the substance has spread. This will help identify the sentinel lymph node. The surgeon will make a small incision. If blue dye was injected, your surgeon will look for any lymph nodes that have picked up the dye. Sentinel lymph nodes will be removed and sent to a lab for testing. If no cancer is found, no other lymph nodes will be removed. It is unlikely the cancer has spread. If cancer is found, the surgeon will remove other lymph nodes for testing. This may happen during the same procedure or at a later time. The incision will be closed with stitches (sutures) or skin glue. A small dressing may be taped over the incision area. The procedure may vary among health care providers and hospitals. What happens after the procedure? Your blood pressure, heart rate, breathing rate, and blood oxygen level will be monitored until you leave the hospital or clinic. Your urine or stool may be blue for the next 24-48 hours. This is normal. It is caused by the dye that  is used during the procedure. If you were given a sedative during the procedure, it can affect you for several hours. Do not drive or operate machinery until your health care provider says that it is safe. It is up to you to get the results of your procedure. Ask your health care provider, or the department that is doing the procedure, when your results will be ready. Summary A sentinel lymph node biopsy is a procedure to identify, remove, and examine one or more lymph nodes for cancer. If you have cancer, you may have this procedure to determine whether your cancer has spread. If no cancer is found in the sentinel lymph node, it is very unlikely that the cancer has spread to any other lymph nodes. If cancer is found in the sentinel lymph node, your surgeon may remove additional lymph nodes for testing. This information is not intended to replace advice given to you by your health care provider. Make sure you discuss any questions you have with your health care provider. Document Revised: 02/16/2020 Document Reviewed: 02/16/2020 Elsevier Patient Education  [place].

## 2021-05-15 ENCOUNTER — Ambulatory Visit (INDEPENDENT_AMBULATORY_CARE_PROVIDER_SITE_OTHER): Payer: Medicare Other

## 2021-05-15 ENCOUNTER — Encounter: Payer: Self-pay | Admitting: Vascular Surgery

## 2021-05-15 ENCOUNTER — Ambulatory Visit (INDEPENDENT_AMBULATORY_CARE_PROVIDER_SITE_OTHER): Payer: Medicare Other | Admitting: Vascular Surgery

## 2021-05-15 VITALS — BP 150/80 | HR 89 | Temp 98.2°F | Wt 148.1 lb

## 2021-05-15 DIAGNOSIS — I70212 Atherosclerosis of native arteries of extremities with intermittent claudication, left leg: Secondary | ICD-10-CM | POA: Diagnosis not present

## 2021-05-15 DIAGNOSIS — R6889 Other general symptoms and signs: Secondary | ICD-10-CM

## 2021-05-15 NOTE — Progress Notes (Signed)
[facility] [place] [facility]  Patient name: Holly Phillips MRN: [id number] DOB: 14-Oct-1955 Sex: female  REASON FOR CONSULT: Evaluation abnormal lower extremity screening ankle-brachial index and discussion of lower extremity symptoms  HPI: [name] is a 65 y.o. female, who is here today for evaluation.  She underwent a outpatient insurance screening through her insurance company.  She was found to have some abnormality of her lower extremity study and is here today for further discussion of this and also for formal noninvasive lower extremity studies.  She does not have any claudication symptoms.  She has no history of lower extremity tissue loss.  She does have a long history of restless leg syndrome and also has lower extremity neuropathy.  This is worse on her left leg than on her right leg.  She has been taking gabapentin for years and feels that this is giving her some benefit.  Past Medical History:  Diagnosis Date   Anxiety    Arthritis    Asthma    Chronic back pain    Colon polyps    COPD (chronic obstructive pulmonary disease) (HCC)    Depression    Emphysema of lung ([place])    Memory change    TIA (transient ischemic attack)    Tremor     Family History  Problem Relation Age of Onset   Depression Mother    Diabetes Mother    Hypertension Mother    Hyperlipidemia Mother    Heart attack Mother    Osteoarthritis Father    Asthma Father    COPD Father    Breast cancer Sister    Lupus Sister    Osteoarthritis Sister    Asthma Sister    COPD Sister    Diabetes Sister    Drug abuse Sister    Heart attack Sister    Heart attack Maternal Grandmother    Colon cancer Neg Hx    Esophageal cancer Neg Hx     SOCIAL HISTORY: Social History   Socioeconomic History   Marital status: Widowed    Spouse name: Not on file   Number of children: 1   Years of education: 46   Highest education level: 11th grade   Occupational History   Not on file  Tobacco Use   Smoking status: Former    Packs/day: 0.50    Types: Cigarettes    Quit date: 07/17/2020    Years since quitting: 0.8   Smokeless tobacco: Never   Tobacco comments:    Not smoking cigarettes but is vaping  04/22/21  Vaping Use   Vaping Use: Every day   Substances: Nicotine, Flavoring  Substance and Sexual Activity   Alcohol use: Not Currently   Drug use: Not Currently    Types: Other-see comments    Comment: CBD and medical marijuana- denies 8/28/2,04/22/21   Sexual activity: Not Currently  Other Topics Concern   Not on file  Social History Narrative   04/22/21 Lives alone   From [place], moved to [place] in 07-26-2019   Husband passed away in 08-22-2016   Social Determinants of Health   Financial Resource Strain: Not on file  Food Insecurity: Not on file  Transportation Needs: Not on file  Physical Activity: Not on file  Stress: Not on file  Social Connections: Not on file  Intimate Partner Violence: Not on file    Allergies  Allergen Reactions   Mucinex [Guaifenesin Er] Other (See Comments)    Jerky movements  Current Outpatient Medications  Medication Sig Dispense Refill   albuterol (PROVENTIL) (2.5 MG/3ML) 0.083% nebulizer solution Take 3 mLs (2.5 mg total) by nebulization every 6 (six) hours as needed for wheezing or shortness of breath. 150 mL 1   albuterol (VENTOLIN HFA) 108 (90 Base) MCG/ACT inhaler INHALE 1 TO 2 PUFFS INTO THE LUNGS EVERY 6 HOURS AS NEEDED FOR WHEEZING OR SHORTNESS OF BREATH 18 g 5   atorvastatin (LIPITOR) 20 MG tablet Take 1 tablet (20 mg total) by mouth daily. 90 tablet 3   budesonide-formoterol (SYMBICORT) 160-4.5 MCG/ACT inhaler Inhale 2 puffs into the lungs 2 (two) times daily. 10.2 g 11   celecoxib (CELEBREX) 100 MG capsule TAKE 1 CAPSULE(100 MG) BY MOUTH DAILY 90 capsule 0   cyclobenzaprine (FLEXERIL) 10 MG tablet Take 1 tablet (10 mg total) by mouth 3 (three) times daily. 90 tablet 1    escitalopram (LEXAPRO) 20 MG tablet Take 20 mg by mouth daily.     furosemide (LASIX) 20 MG tablet Take 1 tablet (20 mg total) by mouth daily as needed for edema. 30 tablet 11   gabapentin (NEURONTIN) 800 MG tablet TAKE 1 TABLET(800 MG) BY MOUTH THREE TIMES DAILY 270 tablet 1   hydrOXYzine (VISTARIL) 50 MG capsule Take 50 mg by mouth 4 (four) times daily.     lidocaine (LIDODERM) 5 % Place 3 patches onto the skin daily as needed (pain).     omeprazole (PRILOSEC) 40 MG capsule Take 1 capsule (40 mg total) by mouth 2 (two) times daily. 90 capsule 1   oxyCODONE-acetaminophen (PERCOCET) 10-325 MG tablet Take 1 tablet by mouth 4 (four) times daily as needed.     predniSONE (DELTASONE) 20 MG tablet Take 2 tablets (40 mg total) by mouth daily. (Patient not taking: Reported on 05/14/2021) 10 tablet 0   tiZANidine (ZANAFLEX) 4 MG tablet Take 4 mg by mouth 3 (three) times daily.     traZODone (DESYREL) 100 MG tablet Take 200 mg by mouth at bedtime.     No current facility-administered medications for this visit.    REVIEW OF SYSTEMS:  [X]  denotes positive finding, [ ]  denotes negative finding Cardiac  Comments:  Chest pain or chest pressure:    Shortness of breath upon exertion: x   Short of breath when lying flat:    Irregular heart rhythm:        Vascular    Pain in calf, thigh, or hip brought on by ambulation:    Pain in feet at night that wakes you up from your sleep:  x   Blood clot in your veins:    Leg swelling:         Pulmonary    Oxygen at home:    Productive cough:     Wheezing:  x       Neurologic    Sudden weakness in arms or legs:     Sudden numbness in arms or legs:  x   Sudden onset of difficulty speaking or slurred speech:    Temporary loss of vision in one eye:     Problems with dizziness:         Gastrointestinal    Blood in stool:     Vomited blood:         Genitourinary    Burning when urinating:     Blood in urine:        Psychiatric    Major depression:          Hematologic  Bleeding problems:    Problems with blood clotting too easily:        Skin    Rashes or ulcers:        Constitutional    Fever or chills:      PHYSICAL EXAM: Vitals:   05/15/21 0955  BP: (!) 150/80  Pulse: 89  Temp: 98.2 F (36.8 C)  TempSrc: Temporal  SpO2: 95%  Weight: 148 lb 2 oz (67.2 kg)    GENERAL: The patient is a well-nourished female, in no acute distress. The vital signs are documented above. CARDIOVASCULAR: Carotid arteries without bruits bilaterally.  2+ radial pulses bilaterally.  2+ femoral, 2+ popliteal, 2+ dorsalis pedis and 2+ posterior tibial pulses bilaterally. PULMONARY: There is good air exchange  MUSCULOSKELETAL: There are no major deformities or cyanosis. NEUROLOGIC: No focal weakness or paresthesias are detected. SKIN: There are no ulcers or rashes noted. PSYCHIATRIC: The patient has a normal affect.  DATA:  Noninvasive studies reveal normal ankle arm index bilaterally with triphasic waveforms at the dorsalis pedis and posterior tibial bilaterally  MEDICAL ISSUES: No evidence of lower extremity arterial insufficiency.  I discussed this at length with the patient.  She will continue her usual treatment regarding her neuropathy and restless leg syndrome.  She will see [place] again on an as-needed basis   Rosetta Posner, MD [facility] Tel [phone] Pager [phone]  Note: Portions of this report may have been transcribed using voice recognition software.  Every effort has been made to ensure accuracy; however, inadvertent computerized transcription errors may still be present.

## 2021-05-15 NOTE — H&P (Signed)
[facility] History and Physical  Reason for Referral: Left breast cancer Referring Physician: Breast center   Chief Complaint   New Patient     Holly Phillips is a 66 y.o. female.  HPI: Holly Phillips is a 65 yo with COPD who comes in with newly diagnosed breast cancer. She gets her regular mammograms, and says she has never had an abnormal mammogram. She says in her 31s she has a biopsy of the right breast that came back as a fibrocystic area.   The patient has no history of any masses, lumps, bumps, nipple changes or discharge. She had menarche at age 41, and her first pregnancy at age 89.  She has no history of any family breast cancer.  She had a hysterectomy at 19. She has never had any previous biopsies or concerning areas on mammogram.  She has not had any chest radiation.  She is here today with her daughter.   Past Medical History:  Diagnosis Date   Anxiety    Arthritis    Asthma    Chronic back pain    Colon polyps    COPD (chronic obstructive pulmonary disease) (HCC)    Depression    Emphysema of lung (HCC)    Memory change    TIA (transient ischemic attack)    Tremor     Past Surgical History:  Procedure Laterality Date   ABDOMINAL HYSTERECTOMY     CERVICAL SPINE SURGERY     INNER EAR SURGERY     LUMBAR FUSION  2012   L3-L7    Family History  Problem Relation Age of Onset   Depression Mother    Diabetes Mother    Hypertension Mother    Hyperlipidemia Mother    Heart attack Mother    Osteoarthritis Father    Asthma Father    COPD Father    Breast cancer Sister    Lupus Sister    Osteoarthritis Sister    Asthma Sister    COPD Sister    Diabetes Sister    Drug abuse Sister    Heart attack Sister    Heart attack Maternal Grandmother    Colon cancer Neg Hx    Esophageal cancer Neg Hx     Social History   Tobacco Use   Smoking status: Former    Packs/day: 0.50    Types: Cigarettes    Quit date: 07/17/2020    Years  since quitting: 0.8   Smokeless tobacco: Never   Tobacco comments:    Not smoking cigarettes but is vaping  04/22/21  Vaping Use   Vaping Use: Every day   Substances: Nicotine, Flavoring  Substance Use Topics   Alcohol use: Not Currently   Drug use: Not Currently    Types: Other-see comments    Comment: CBD and medical marijuana- denies 8/28/2,04/22/21    Medications: I have reviewed the patient's current medications. Allergies as of 05/14/2021       Reactions   Mucinex [guaifenesin Er] Other (See Comments)   Jerky movements        Medication List        Accurate as of May 14, 2021  9:24 AM. If you have any questions, ask your nurse or doctor.          STOP taking these medications    Linzess 290 MCG Caps capsule Generic drug: linaclotide Stopped by: Virl Cagey, MD   montelukast 10 MG tablet Commonly known as: SINGULAIR Stopped by: Ria Comment  Jeanette Caprice, MD       TAKE these medications    albuterol (2.5 MG/3ML) 0.083% nebulizer solution Commonly known as: PROVENTIL Take 3 mLs (2.5 mg total) by nebulization every 6 (six) hours as needed for wheezing or shortness of breath.   albuterol 108 (90 Base) MCG/ACT inhaler Commonly known as: VENTOLIN HFA INHALE 1 TO 2 PUFFS INTO THE LUNGS EVERY 6 HOURS AS NEEDED FOR WHEEZING OR SHORTNESS OF BREATH   atorvastatin 20 MG tablet Commonly known as: LIPITOR Take 1 tablet (20 mg total) by mouth daily.   budesonide-formoterol 160-4.5 MCG/ACT inhaler Commonly known as: SYMBICORT Inhale 2 puffs into the lungs 2 (two) times daily.   celecoxib 100 MG capsule Commonly known as: CELEBREX TAKE 1 CAPSULE(100 MG) BY MOUTH DAILY   cyclobenzaprine 10 MG tablet Commonly known as: FLEXERIL Take 1 tablet (10 mg total) by mouth 3 (three) times daily.   escitalopram 20 MG tablet Commonly known as: LEXAPRO Take 20 mg by mouth daily.   furosemide 20 MG tablet Commonly known as: LASIX Take 1 tablet (20 mg total) by  mouth daily as needed for edema.   gabapentin 800 MG tablet Commonly known as: NEURONTIN TAKE 1 TABLET(800 MG) BY MOUTH THREE TIMES DAILY   hydrOXYzine 50 MG capsule Commonly known as: VISTARIL Take 50 mg by mouth 4 (four) times daily.   lidocaine 5 % Commonly known as: LIDODERM Place 3 patches onto the skin daily as needed (pain).   omeprazole 40 MG capsule Commonly known as: PRILOSEC Take 1 capsule (40 mg total) by mouth 2 (two) times daily.   oxyCODONE-acetaminophen 10-325 MG tablet Commonly known as: PERCOCET Take 1 tablet by mouth 4 (four) times daily as needed.   predniSONE 20 MG tablet Commonly known as: DELTASONE Take 2 tablets (40 mg total) by mouth daily.   tiZANidine 4 MG tablet Commonly known as: ZANAFLEX Take 4 mg by mouth 3 (three) times daily.   traZODone 100 MG tablet Commonly known as: DESYREL Take 200 mg by mouth at bedtime.         ROS:  A comprehensive review of systems was negative except for: Respiratory: positive for wheezing and SOB Musculoskeletal: positive for back pain, neck pain, and joint pain  Blood pressure (!) 153/78, pulse 83, temperature 98.5 F (36.9 C), temperature source Oral, resp. rate 18, height _0  (1.499 m), weight 151 lb (68.5 kg), SpO2 98 %. Physical Exam Vitals reviewed.  Constitutional:      Appearance: Normal appearance.  HENT:     Head: Atraumatic.     Mouth/Throat:     Mouth: Mucous membranes are moist.  Eyes:     Extraocular Movements: Extraocular movements intact.  Cardiovascular:     Rate and Rhythm: Normal rate and regular rhythm.  Pulmonary:     Effort: Pulmonary effort is normal.     Breath sounds: Normal breath sounds.  Chest:  Breasts:    Right: No inverted nipple, mass, nipple discharge or skin change.     Left: No inverted nipple, mass, nipple discharge or skin change.     Comments: Left breast with evolving bruise from biopsy Abdominal:     Palpations: Abdomen is soft.     Tenderness:  There is no abdominal tenderness.  Musculoskeletal:        General: Normal range of motion.     Cervical back: Normal range of motion.  Lymphadenopathy:     Cervical:     Right cervical: No superficial cervical  adenopathy.    Left cervical: No superficial cervical adenopathy.     Upper Body:     Right upper body: No supraclavicular or axillary adenopathy.     Left upper body: No supraclavicular or axillary adenopathy.  Skin:    General: Skin is warm.  Neurological:     General: No focal deficit present.     Mental Status: She is alert and oriented to person, place, and time.  Psychiatric:        Mood and Affect: Mood normal.        Behavior: Behavior normal.        Thought Content: Thought content normal.        Judgment: Judgment normal.    Results:  CLINICAL DATA:  Screening.   EXAM: DIGITAL SCREENING BILATERAL MAMMOGRAM WITH TOMOSYNTHESIS AND CAD   TECHNIQUE: Bilateral screening digital craniocaudal and mediolateral oblique mammograms were obtained. Bilateral screening digital breast tomosynthesis was performed. The images were evaluated with computer-aided detection.   COMPARISON:  None.   ACR Breast Density Category b: There are scattered areas of fibroglandular density.   FINDINGS: In the right breast an asymmetry requires further evaluation.   In the left breast a possible mass requires further evaluation.   IMPRESSION: Further evaluation is suggested for an asymmetry in the right breast.   Further evaluation is suggested for a possible mass in the left breast.   RECOMMENDATION: Diagnostic mammogram and possibly ultrasound of both breasts. (Code:FI-B-50M)   The patient will be contacted regarding the findings, and additional imaging will be scheduled.   BI-RADS CATEGORY  0: Incomplete. Need additional imaging evaluation and/or prior mammograms for comparison.     Electronically Signed   By: Ammie Ferrier M.D.   On: 03/14/2021 14:47    CLINICAL DATA:  65 year old female presenting as a recall from baseline exam for possible right breast asymmetry and possible left breast mass.   EXAM: DIGITAL DIAGNOSTIC BILATERAL MAMMOGRAM WITH TOMOSYNTHESIS AND CAD; ULTRASOUND LEFT BREAST LIMITED   TECHNIQUE: Bilateral digital diagnostic mammography and breast tomosynthesis was performed. The images were evaluated with computer-aided detection.; Targeted ultrasound examination of the left breast was performed.   COMPARISON:  Previous exam(s).   ACR Breast Density Category b: There are scattered areas of fibroglandular density.   FINDINGS: Mammogram:   Right breast: Spot compression tomosynthesis MLO and full field mL tomosynthesis views of the right breast performed for an asymmetry seen only on MLO view in the superior right breast. On the additional imaging the tissue in this area disperses without persistent asymmetry, mass, or distortion.   Left breast: Spot compression tomosynthesis views of the left breast were performed demonstrating persistence of a 0.9 cm irregular mass in the upper outer left breast anterior depth with a few associated calcifications.   Ultrasound:   Targeted ultrasound performed in the left breast at 1 o'clock 2 cm from the nipple demonstrating an irregular hypoechoic mass measuring 0.8 x 0.7 x 0.8 cm. This corresponds to the mammographic finding.   Targeted ultrasound of the left axilla demonstrates normal lymph nodes.   IMPRESSION: 1. Suspicious mass in the left breast at 1 o'clock measuring 0.8 cm.   2.  Resolution of the questioned asymmetry in the right breast.   RECOMMENDATION: Ultrasound-guided core needle biopsy of the left breast mass at 1 o'clock.   I have discussed the findings and recommendations with the patient who agrees to proceed with biopsy. The patient will be scheduled for the biopsy appointment prior to  leaving the office today.   BI-RADS CATEGORY  4:  Suspicious.     Electronically Signed   By: Audie Pinto M.D.   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 This was found to be concordant by Dr. Nolon Nations.   Pathology results were discussed with the patient by telephone. The patient reported doing well after the biopsy with tenderness at the site. Post biopsy instructions and care were reviewed and questions were answered. The patient was encouraged to call [facility] for any additional concerns. My direct phone number was provided.   Surgical consultation has been arranged with Dr. [name] at [facility] on May 14, 2021.   Pathology results reported by Terie Purser, RN on 05/02/2021. CLINICAL DATA:  Patient presents for ultrasound-guided core biopsy of LEFT breast mass.   EXAM: ULTRASOUND GUIDED LEFT BREAST CORE NEEDLE BIOPSY   COMPARISON:  Previous exam(s).   PROCEDURE: I met with the patient and we discussed the procedure of ultrasound-guided biopsy, including benefits and alternatives. We discussed the high likelihood of a successful procedure. We discussed the risks of the procedure, including infection, bleeding, tissue injury, clip migration, and inadequate sampling. Informed written consent was given. The usual time-out protocol was performed immediately prior to the procedure.   Lesion quadrant: UPPER-OUTER QUADRANT LEFT breast   Using sterile technique and 1% Lidocaine as local anesthetic, under direct ultrasound visualization, a 12 gauge spring-loaded device was used to perform biopsy of mass in the 1 o'clock location of the LEFT breast using a LATERAL to approach. At the conclusion of the procedure ribbon tissue marker clip was deployed into the biopsy cavity. Follow up 2 view  mammogram was performed and dictated separately.   IMPRESSION: Ultrasound guided biopsy of LEFT breast mass. No apparent complications.   Electronically Signed: By: Nolon Nations M.D. On: 04/29/2021 08:13   Assessment & Plan:  Holly Phillips is a 65 y.o. female with a left breast cancer.  -We have discussed the options for surgery including the option of mastectomy with sentinel node biopsy versus partial mastectomy (lumpectomy) with sentinel node biopsy. We have discussed that there is no difference in the prognosis or chance or recurrence or differences in survival between the two options. We have discussed the need for radiation with the lumpectomy, and we have discussed that she will be referred to oncology after our procedure to further discuss her options for chemotherapy and hormonal therapy if she qualifies.   We have discussed that if she decides to have a lumpectomy that we will need to get a needle placed into the area where the biopsy was performed, since we cannot palpate a mass. We have also discussed the need for injection of radiotracer and blue dye to perform the sentinel node biopsy.  We have discussed that the sentinel node biopsy tells [place] if the cancer has spread to the lymph nodes, and can help with plans for chemotherapy treatment and overall prognosis.    We have discussed that if the lumpectomy does not remove the entire cancer that she may have to have an additional procedure, and we have discussed that a positive sentinel node can require further removal of lymph nodes from the axilla but that recent research does not show any improvement in disease free survival and carries greater risk for lymphedema.  We have discussed that these are big discussions, and that the risk from the operations are similar including risk of bleeding, risk of infection, and risk of needing additional surgeries. We have discussed the likely need for an overnight stay with a  mastectomy and a drain that will remain in place for about 1 week.    Will proceed with tag placement and lumpectomy and sentinel node biopsy.   All questions were answered to the satisfaction of the patient and family.     Virl Cagey 05/14/2021, 9:24 AM

## 2021-05-21 NOTE — Patient Instructions (Addendum)
Holly Phillips  05/21/2021     @PREFPERIOPPHARMACY @   Your procedure is scheduled on  05/29/2021.   Report to [facility] at  0830 A.M.   Call this number if you have problems the morning of surgery:  [phone]   Remember:  Do not eat or drink after midnight.      Take these medicines the morning of surgery with A SIP OF WATER              flexeril(if needed), lexapro, gabapentin, prilosec, oxycodone(if needed), zanaflex(if needed).     Do not wear jewelry, make-up or nail polish.  Do not wear lotions, powders, or perfumes, or deodorant.  Do not shave 48 hours prior to surgery.  Men may shave face and neck.  Do not bring valuables to the hospital.  [facility] is not responsible for any belongings or valuables.  Contacts, dentures or bridgework may not be worn into surgery.  Leave your suitcase in the car.  After surgery it may be brought to your room.  For patients admitted to the hospital, discharge time will be determined by your treatment team.  Patients discharged the day of surgery will not be allowed to drive home and must have someone with them for 24 hours.    Special instructions:   DO NOT smoke tobacco or vape for 24 hours before your procedure.  Please read over the following fact sheets that you were given. Coughing and Deep Breathing, Surgical Site Infection Prevention, Anesthesia Post-op Instructions, and Care and Recovery After Surgery       Sentinel Lymph Node Biopsy in Breast Cancer Treatment, Care After This sheet gives you information about how to care for yourself after your procedure. Your health care provider may also give you more specific instructions. If you have problems or questions, contact your health care provider. What can I expect after the procedure? After the procedure, it is common to have: Blue urine and darker stool for the next 24 hours. This is normal. It is caused by the dye that was used during the  procedure. Blue skin at the injection site. This may last for up to 8 weeks. Numbness, tingling, or pain near your incision. Swelling or bruising near your incision. Follow these instructions at home: Activity Avoid activities that take a lot of effort. Return to your normal activities as told by your health care provider. Ask your health care provider what activities are safe for you. Incision care  Follow instructions from your health care provider about how to take care of your incision. Make sure you: Wash your hands with soap and water for at least 20 seconds before and after you change your bandage (dressing). If soap and water are not available, use hand sanitizer. Change your dressing as told by your health care provider. Leave stitches (sutures), skin glue, or adhesive strips in place. These skin closures may need to stay in place for 2 weeks or longer. If adhesive strip edges start to loosen and curl up, you may trim the loose edges. Do not remove adhesive strips completely unless your health care provider tells you to do that. Do not take baths, swim, or use a hot tub until your health care provider approves. Ask your health care provider if you can take showers. You may be able to shower 24 hours after your procedure. Check your biopsy site every day for signs of infection. Check for: More redness, swelling, or  pain. Fluid or blood. Warmth. Pus or a bad smell. General instructions If you were given a surgical bra, wear it for the next 48 hours. You may remove the bra to shower. Take over-the-counter and prescription medicines only as told by your health care provider. You may resume your regular diet. Do not have your blood pressure taken or have blood drawn from the arm on the side of the biopsy until your health care provider says it is okay. You may need to be screened for extra fluid around the lymph nodes and swelling in the breast and arm (lymphedema). Follow instructions  from your health care provider about how often you should be checked. Keep all follow-up visits as told by your health care provider. This is important. Contact a health care provider if you have: Nausea and vomiting. Any of these signs of infection: More redness, swelling, or pain around your biopsy site. Fluid or blood coming from your incision. Warmth coming from your incision area. Pus or a bad smell coming from your incision. Any new bruising. Chills or a fever. Get help right away if you have: Pain that is getting worse and your medicine is not helping. Vomiting that will not stop. Chest pain or trouble breathing. Summary After the procedure, it is common to have blue urine and darker stool for the next 24 hours. This is normal. It is caused by the dye that was used during the procedure. Follow instructions from your health care provider about how to take care of your incision. Do not have your blood pressure taken or have blood drawn from the arm on the side of the biopsy until your health care provider says it is okay. You may need to be screened for extra fluid around the lymph nodes and swelling in the breast and arm (lymphedema). Follow instructions from your health care provider about how often you should be checked. This information is not intended to replace advice given to you by your health care provider. Make sure you discuss any questions you have with your health care provider. Document Revised: 12/20/2018 Document Reviewed: 12/20/2018 Elsevier Patient Education  [place] [facility], Adult, Care After This sheet gives you information about how to care for yourself after your procedure. Your health care provider may also give you more specific instructions. If you have problems or questions, contact your health care provider. What can I expect after the procedure? After the procedure, the following side effects are common: Pain or discomfort at the IV  site. Nausea. Vomiting. Sore throat. Trouble concentrating. Feeling cold or chills. Feeling weak or tired. Sleepiness and fatigue. Soreness and body aches. These side effects can affect parts of the body that were not involved in surgery. Follow these instructions at home: For the time period you were told by your health care provider:  Rest. Do not participate in activities where you could fall or become injured. Do not drive or use machinery. Do not drink alcohol. Do not take sleeping pills or medicines that cause drowsiness. Do not make important decisions or sign legal documents. Do not take care of children on your own. Eating and drinking Follow any instructions from your health care provider about eating or drinking restrictions. When you feel hungry, start by eating small amounts of foods that are soft and easy to digest (bland), such as toast. Gradually return to your regular diet. Drink enough fluid to keep your urine pale yellow. If you vomit, rehydrate by drinking water, juice,  or clear broth. General instructions If you have sleep apnea, surgery and certain medicines can increase your risk for breathing problems. Follow instructions from your health care provider about wearing your sleep device: Anytime you are sleeping, including during daytime naps. While taking prescription pain medicines, sleeping medicines, or medicines that make you drowsy. Have a responsible adult stay with you for the time you are told. It is important to have someone help care for you until you are awake and alert. Return to your normal activities as told by your health care provider. Ask your health care provider what activities are safe for you. Take over-the-counter and prescription medicines only as told by your health care provider. If you smoke, do not smoke without supervision. Keep all follow-up visits as told by your health care provider. This is important. Contact a health care  provider if: You have nausea or vomiting that does not get better with medicine. You cannot eat or drink without vomiting. You have pain that does not get better with medicine. You are unable to pass urine. You develop a skin rash. You have a fever. You have redness around your IV site that gets worse. Get help right away if: You have difficulty breathing. You have chest pain. You have blood in your urine or stool, or you vomit blood. Summary After the procedure, it is common to have a sore throat or nausea. It is also common to feel tired. Have a responsible adult stay with you for the time you are told. It is important to have someone help care for you until you are awake and alert. When you feel hungry, start by eating small amounts of foods that are soft and easy to digest (bland), such as toast. Gradually return to your regular diet. Drink enough fluid to keep your urine pale yellow. Return to your normal activities as told by your health care provider. Ask your health care provider what activities are safe for you. This information is not intended to replace advice given to you by your health care provider. Make sure you discuss any questions you have with your health care provider. Document Revised: 01/19/2020 Document Reviewed: 08/18/2019 Elsevier Patient Education  2022 [place]. How to Use Chlorhexidine for Bathing Chlorhexidine gluconate (CHG) is a germ-killing (antiseptic) solution that is used to clean the skin. It can get rid of the bacteria that normally live on the skin and can keep them away for about 24 hours. To clean your skin with CHG, you may be given: A CHG solution to use in the shower or as part of a sponge bath. A prepackaged cloth that contains CHG. Cleaning your skin with CHG may help lower the risk for infection: While you are staying in the intensive care unit of the hospital. If you have a vascular access, such as a central line, to provide short-term or  long-term access to your veins. If you have a catheter to drain urine from your bladder. If you are on a ventilator. A ventilator is a machine that helps you breathe by moving air in and out of your lungs. After surgery. What are the risks? Risks of using CHG include: A skin reaction. Hearing loss, if CHG gets in your ears and you have a perforated eardrum. Eye injury, if CHG gets in your eyes and is not rinsed out. The CHG product catching fire. Make sure that you avoid smoking and flames after applying CHG to your skin. Do not use CHG: If you have  a chlorhexidine allergy or have previously reacted to chlorhexidine. On babies younger than 70 months of age. How to use CHG solution Use CHG only as told by your health care provider, and follow the instructions on the label. Use the full amount of CHG as directed. Usually, this is one bottle. During a shower Follow these steps when using CHG solution during a shower (unless your health care provider gives you different instructions): Start the shower. Use your normal soap and shampoo to wash your face and hair. Turn off the shower or move out of the shower stream. Pour the CHG onto a clean washcloth. Do not use any type of brush or rough-edged sponge. Starting at your neck, lather your body down to your toes. Make sure you follow these instructions: If you will be having surgery, pay special attention to the part of your body where you will be having surgery. Scrub this area for at least 1 minute. Do not use CHG on your head or face. If the solution gets into your ears or eyes, rinse them well with water. Avoid your genital area. Avoid any areas of skin that have broken skin, cuts, or scrapes. Scrub your back and under your arms. Make sure to wash skin folds. Let the lather sit on your skin for 1-2 minutes or as long as told by your health care provider. Thoroughly rinse your entire body in the shower. Make sure that all body creases and  crevices are rinsed well. Dry off with a clean towel. Do not put any substances on your body afterward--such as powder, lotion, or perfume--unless you are told to do so by your health care provider. Only use lotions that are recommended by the manufacturer. Put on clean clothes or pajamas. If it is the night before your surgery, sleep in clean sheets.  During a sponge bath Follow these steps when using CHG solution during a sponge bath (unless your health care provider gives you different instructions): Use your normal soap and shampoo to wash your face and hair. Pour the CHG onto a clean washcloth. Starting at your neck, lather your body down to your toes. Make sure you follow these instructions: If you will be having surgery, pay special attention to the part of your body where you will be having surgery. Scrub this area for at least 1 minute. Do not use CHG on your head or face. If the solution gets into your ears or eyes, rinse them well with water. Avoid your genital area. Avoid any areas of skin that have broken skin, cuts, or scrapes. Scrub your back and under your arms. Make sure to wash skin folds. Let the lather sit on your skin for 1-2 minutes or as long as told by your health care provider. Using a different clean, wet washcloth, thoroughly rinse your entire body. Make sure that all body creases and crevices are rinsed well. Dry off with a clean towel. Do not put any substances on your body afterward--such as powder, lotion, or perfume--unless you are told to do so by your health care provider. Only use lotions that are recommended by the manufacturer. Put on clean clothes or pajamas. If it is the night before your surgery, sleep in clean sheets. How to use CHG prepackaged cloths Only use CHG cloths as told by your health care provider, and follow the instructions on the label. Use the CHG cloth on clean, dry skin. Do not use the CHG cloth on your head or face unless your  health  care provider tells you to. When washing with the CHG cloth: Avoid your genital area. Avoid any areas of skin that have broken skin, cuts, or scrapes. Before surgery Follow these steps when using a CHG cloth to clean before surgery (unless your health care provider gives you different instructions): Using the CHG cloth, vigorously scrub the part of your body where you will be having surgery. Scrub using a back-and-forth motion for 3 minutes. The area on your body should be completely wet with CHG when you are done scrubbing. Do not rinse. Discard the cloth and let the area air-dry. Do not put any substances on the area afterward, such as powder, lotion, or perfume. Put on clean clothes or pajamas. If it is the night before your surgery, sleep in clean sheets.  For general bathing Follow these steps when using CHG cloths for general bathing (unless your health care provider gives you different instructions). Use a separate CHG cloth for each area of your body. Make sure you wash between any folds of skin and between your fingers and toes. Wash your body in the following order, switching to a new cloth after each step: The front of your neck, shoulders, and chest. Both of your arms, under your arms, and your hands. Your stomach and groin area, avoiding the genitals. Your right leg and foot. Your left leg and foot. The back of your neck, your back, and your buttocks. Do not rinse. Discard the cloth and let the area air-dry. Do not put any substances on your body afterward--such as powder, lotion, or perfume--unless you are told to do so by your health care provider. Only use lotions that are recommended by the manufacturer. Put on clean clothes or pajamas. Contact a health care provider if: Your skin gets irritated after scrubbing. You have questions about using your solution or cloth. You swallow any chlorhexidine. Call your local poison control center (1-(908)226-7915 in the [place] Get help  right away if: Your eyes itch badly, or they become very red or swollen. Your skin itches badly and is red or swollen. Your hearing changes. You have trouble seeing. You have swelling or tingling in your mouth or throat. You have trouble breathing. These symptoms may represent a serious problem that is an emergency. Do not wait to see if the symptoms will go away. Get medical help right away. Call your local emergency services (911 in the [place] Do not drive yourself to the hospital. Summary Chlorhexidine gluconate (CHG) is a germ-killing (antiseptic) solution that is used to clean the skin. Cleaning your skin with CHG may help to lower your risk for infection. You may be given CHG to use for bathing. It may be in a bottle or in a prepackaged cloth to use on your skin. Carefully follow your health care provider's instructions and the instructions on the product label. Do not use CHG if you have a chlorhexidine allergy. Contact your health care provider if your skin gets irritated after scrubbing. This information is not intended to replace advice given to you by your health care provider. Make sure you discuss any questions you have with your health care provider. Document Revised: 07/16/2020 Document Reviewed: 07/16/2020 Elsevier Patient Education  2022 [employer].

## 2021-05-23 ENCOUNTER — Encounter (HOSPITAL_COMMUNITY)
Admission: RE | Admit: 2021-05-23 | Discharge: 2021-05-23 | Disposition: A | Discharge status: Home / Self Care | Payer: Medicare Other | Source: Ambulatory Visit | Attending: General Surgery | Admitting: General Surgery

## 2021-05-23 DIAGNOSIS — Z79899 Other long term (current) drug therapy: Secondary | ICD-10-CM

## 2021-05-27 ENCOUNTER — Ambulatory Visit (INDEPENDENT_AMBULATORY_CARE_PROVIDER_SITE_OTHER): Payer: Medicare Other | Admitting: Physician Assistant

## 2021-05-27 ENCOUNTER — Encounter: Payer: Self-pay | Admitting: Physician Assistant

## 2021-05-27 ENCOUNTER — Other Ambulatory Visit: Payer: Self-pay

## 2021-05-27 VITALS — BP 120/78 | HR 82 | Temp 97.9°F | Ht 59.0 in | Wt 155.0 lb

## 2021-05-27 DIAGNOSIS — J449 Chronic obstructive pulmonary disease, unspecified: Secondary | ICD-10-CM

## 2021-05-27 DIAGNOSIS — F5101 Primary insomnia: Secondary | ICD-10-CM | POA: Diagnosis not present

## 2021-05-27 DIAGNOSIS — M1990 Unspecified osteoarthritis, unspecified site: Secondary | ICD-10-CM

## 2021-05-27 DIAGNOSIS — G2581 Restless legs syndrome: Secondary | ICD-10-CM | POA: Diagnosis not present

## 2021-05-27 DIAGNOSIS — F339 Major depressive disorder, recurrent, unspecified: Secondary | ICD-10-CM | POA: Diagnosis not present

## 2021-05-27 DIAGNOSIS — F419 Anxiety disorder, unspecified: Secondary | ICD-10-CM

## 2021-05-27 LAB — CBC WITH DIFFERENTIAL/PLATELET
Basophils Absolute: 0 10*3/uL (ref 0.0–0.1)
Basophils Relative: 0.4 % (ref 0.0–3.0)
Eosinophils Absolute: 0.1 10*3/uL (ref 0.0–0.7)
Eosinophils Relative: 1 % (ref 0.0–5.0)
HCT: 38.3 % (ref 36.0–46.0)
Hemoglobin: 12.5 g/dL (ref 12.0–15.0)
Lymphocytes Relative: 29.3 % (ref 12.0–46.0)
Lymphs Abs: 3 10*3/uL (ref 0.7–4.0)
MCHC: 32.5 g/dL (ref 30.0–36.0)
MCV: 93.8 fl (ref 78.0–100.0)
Monocytes Absolute: 0.9 10*3/uL (ref 0.1–1.0)
Monocytes Relative: 8.7 % (ref 3.0–12.0)
Neutro Abs: 6.2 10*3/uL (ref 1.4–7.7)
Neutrophils Relative %: 60.6 % (ref 43.0–77.0)
Platelets: 382 10*3/uL (ref 150.0–400.0)
RBC: 4.09 Mil/uL (ref 3.87–5.11)
RDW: 15.5 % (ref 11.5–15.5)
WBC: 10.2 10*3/uL (ref 4.0–10.5)

## 2021-05-27 LAB — IBC + FERRITIN
Ferritin: 20.7 ng/mL (ref 10.0–291.0)
Iron: 45 ug/dL (ref 42–145)
Saturation Ratios: 12.6 % — ABNORMAL LOW (ref 20.0–50.0)
TIBC: 358.4 ug/dL (ref 250.0–450.0)
Transferrin: 256 mg/dL (ref 212.0–360.0)

## 2021-05-27 MED ORDER — CELECOXIB 100 MG PO CAPS: 100.0000 mg | ORAL_CAPSULE | Freq: Two times a day (BID) | ORAL | 1 refills | Status: DC

## 2021-05-27 MED ORDER — TRAZODONE HCL 100 MG PO TABS: 200.0000 mg | ORAL_TABLET | Freq: Every day | ORAL | 1 refills | Status: DC

## 2021-05-27 MED ORDER — SPIRIVA RESPIMAT 2.5 MCG/ACT IN AERS: 2.0000 | INHALATION_SPRAY | Freq: Every day | RESPIRATORY_TRACT | 3 refills | Status: DC

## 2021-05-27 MED ORDER — MONTELUKAST SODIUM 10 MG PO TABS: 10.0000 mg | ORAL_TABLET | Freq: Every day | ORAL | 1 refills | Status: DC

## 2021-05-27 MED ORDER — ESCITALOPRAM OXALATE 20 MG PO TABS: 20.0000 mg | ORAL_TABLET | Freq: Every day | ORAL | 1 refills | Status: DC

## 2021-05-27 NOTE — Progress Notes (Signed)
Holly Phillips is a 66 y.o. female here to discuss medication.  History of Present Illness:   Chief Complaint  Patient presents with   Discuss medications    Pt says she has a new pain management provider since her old provider left and he will not fill some of her medications. She says he will not fill Lexapro, Trazodone and Singulair.    HPI  Anxiety Due to not being able to have the medication refilled pt has not been compliant with lexapro 20 mg daily. Despite this she does find the medication to be helpful when she is taking it. She will need a refill of this medication.   Arthritis At this time, [name] has recently changed to a new pain management provider since her old provider left the practice. Following their first visit, he informed her that she would need to have certain medications refilled by her PCP, including Celebrex 100 mg. This is a somewhat helpful medication for her -- she would like this refilled and is interested in increasing the dosage.  Insomnia/Restless Leg Syndrome [name] has been dealing with staying asleep at night for a while. She is compliant with taking trazodone 200 mg daily, but states she is still having trouble staying asleep. Following further discussion, it was found that pt is experiencing multiple episodes of restless leg syndrome which contributes to her lack of sleep.   COPD Currently compliant with using symbicort 160-4.5 mcg ACT inhaler- 2 puffs in the AM and PM and albuterol 108 mcg base inhaler as needed with no complications. She is also compliant with taking singulair 10 mg daily, but is still experiencing wheezing.     Past Medical History:  Diagnosis Date   Anxiety    Arthritis    Asthma    Chronic back pain    Colon polyps    COPD (chronic obstructive pulmonary disease) (HCC)    Depression    Emphysema of lung (HCC)    Memory change    TIA (transient ischemic attack)    Tremor      Social History   Tobacco Use    Smoking status: Former    Packs/day: 0.50    Types: Cigarettes    Quit date: 07/17/2020    Years since quitting: 0.8   Smokeless tobacco: Never   Tobacco comments:    Not smoking cigarettes but is vaping  04/22/21  Vaping Use   Vaping Use: Every day   Substances: Nicotine, Flavoring  Substance Use Topics   Alcohol use: Not Currently   Drug use: Not Currently    Types: Other-see comments    Comment: CBD and medical marijuana- denies 8/28/2,04/22/21    Past Surgical History:  Procedure Laterality Date   ABDOMINAL HYSTERECTOMY     CERVICAL SPINE SURGERY     INNER EAR SURGERY     LUMBAR FUSION  2012   L3-L7    Family History  Problem Relation Age of Onset   Depression Mother    Diabetes Mother    Hypertension Mother    Hyperlipidemia Mother    Heart attack Mother    Osteoarthritis Father    Asthma Father    COPD Father    Breast cancer Sister    Lupus Sister    Osteoarthritis Sister    Asthma Sister    COPD Sister    Diabetes Sister    Drug abuse Sister    Heart attack Sister    Heart attack Maternal Grandmother    Colon  cancer Neg Hx    Esophageal cancer Neg Hx     Allergies  Allergen Reactions   Mucinex [Guaifenesin Er] Other (See Comments)    Jerky movements    Current Medications:   Current Outpatient Medications:    albuterol (PROVENTIL) (2.5 MG/3ML) 0.083% nebulizer solution, Take 3 mLs (2.5 mg total) by nebulization every 6 (six) hours as needed for wheezing or shortness of breath., Disp: 150 mL, Rfl: 1   albuterol (VENTOLIN HFA) 108 (90 Base) MCG/ACT inhaler, INHALE 1 TO 2 PUFFS INTO THE LUNGS EVERY 6 HOURS AS NEEDED FOR WHEEZING OR SHORTNESS OF BREATH, Disp: 18 g, Rfl: 5   atorvastatin (LIPITOR) 20 MG tablet, Take 1 tablet (20 mg total) by mouth daily., Disp: 90 tablet, Rfl: 3   budesonide-formoterol (SYMBICORT) 160-4.5 MCG/ACT inhaler, Inhale 2 puffs into the lungs 2 (two) times daily., Disp: 10.2 g, Rfl: 11   celecoxib (CELEBREX) 100 MG capsule, TAKE  1 CAPSULE(100 MG) BY MOUTH DAILY, Disp: 90 capsule, Rfl: 0   cyclobenzaprine (FLEXERIL) 10 MG tablet, Take 1 tablet (10 mg total) by mouth 3 (three) times daily., Disp: 90 tablet, Rfl: 1   escitalopram (LEXAPRO) 20 MG tablet, Take 20 mg by mouth daily., Disp: , Rfl:    furosemide (LASIX) 20 MG tablet, Take 20 mg by mouth daily as needed for fluid., Disp: , Rfl:    gabapentin (NEURONTIN) 300 MG capsule, Take 600-1,200 mg by mouth See admin instructions. 600 mg 3 times daily, 1200 mg at bedtime, Disp: , Rfl:    lidocaine (LIDODERM) 5 %, Place 3 patches onto the skin daily as needed (pain)., Disp: , Rfl:    montelukast (SINGULAIR) 10 MG tablet, Take 10 mg by mouth at bedtime., Disp: , Rfl:    omeprazole (PRILOSEC) 40 MG capsule, Take 1 capsule (40 mg total) by mouth 2 (two) times daily., Disp: 90 capsule, Rfl: 1   oxyCODONE-acetaminophen (PERCOCET) 10-325 MG tablet, Take 1 tablet by mouth 6 (six) times daily., Disp: , Rfl:    predniSONE (DELTASONE) 20 MG tablet, Take 2 tablets (40 mg total) by mouth daily., Disp: 10 tablet, Rfl: 0   tiZANidine (ZANAFLEX) 4 MG tablet, Take 4 mg by mouth in the morning, at noon, in the evening, and at bedtime., Disp: , Rfl:    traZODone (DESYREL) 100 MG tablet, Take 200 mg by mouth at bedtime., Disp: , Rfl:    Review of Systems:   ROS Negative unless otherwise specified per HPI.  Vitals:   Vitals:   05/27/21 0934  BP: 120/78  Pulse: 82  Temp: 97.9 F (36.6 C)  TempSrc: Temporal  Weight: 155 lb (70.3 kg)  Height: 4\' 11"  (1.499 m)     Body mass index is 31.31 kg/m.  Physical Exam:   Physical Exam Vitals and nursing note reviewed.  Constitutional:      General: She is not in acute distress.    Appearance: She is well-developed. She is not ill-appearing or toxic-appearing.  Cardiovascular:     Rate and Rhythm: Normal rate and regular rhythm.     Pulses: Normal pulses.     Heart sounds: Normal heart sounds, S1 normal and S2 normal.  Pulmonary:      Effort: Pulmonary effort is normal.     Breath sounds: Examination of the right-upper field reveals wheezing. Examination of the right-middle field reveals wheezing. Examination of the right-lower field reveals wheezing. Wheezing present.  Skin:    General: Skin is warm and dry.  Neurological:  Mental Status: She is alert.     GCS: GCS eye subscore is 4. GCS verbal subscore is 5. GCS motor subscore is 6.  Psychiatric:        Speech: Speech normal.        Behavior: Behavior normal. Behavior is cooperative.    Assessment and Plan:   Anxiety; Recurrent depression ([place]) Uncontrolled due to medication not being available Restart lexapro 20 mg daily  I advised patient that if they develop any SI, to tell someone immediately and seek medical attention Follow-up in 6 months, sooner if concerns  Arthritis Increase celebrex to 100 mg twice daily  Follow up if new/worsening symptoms or concerns occur  Primary insomnia Continue trazodone 200 mg daily  Informed patient that they may also take OTC melatonin  if symptoms do not improve Follow up as needed  Chronic obstructive pulmonary disease, unspecified COPD type (HCC) Uncontrolled  Continue Symbicort 2 4.5 mcg puffs in AM and PM, Singulair 10 mg daily, and Albuterol 108 mcg inhaler as needed Start Spiriva 2.5 mcg 2 puffs in AM Informed patient to reach out if inhaler is too expensive Follow up with me or pulmonology in 4-6 weeks, sooner if concerns occur   Restless leg syndrome Update labs today, will make recommendations accordingly  Likely add in oral iron to get ferritin to goal of 75 Consider alternative medication if ferritin is WNL  I,Havlyn C Ratchford,acting as a scribe for [employer], PA.,have documented all relevant documentation on the behalf of Inda Coke, PA,as directed by  Inda Coke, PA while in the presence of Inda Coke, [place].  I, Inda Coke, [place], have reviewed all documentation for this  visit. The documentation on 05/27/21 for the exam, diagnosis, procedures, and orders are all accurate and complete.   Inda Coke, PA-C

## 2021-05-27 NOTE — Patient Instructions (Signed)
It was great to see you!  I have refilled your singulair, lexapro and trazodone  For your restless legs -- we will update your iron levels and see if you could use supplementation to help with this. If we can improve your restless legs, we can likely improve your sleeping!  For your COPD,  Continue SYMBICORT TWO PUFFS in AM and TWO PUFFS in PM Add SPIRIVA TWO PUFFS in AM Continue Albuterol as needed  Follow-up with me or pulmonary in 4-6 weeks, If this inhaler is too expensive, please call me and let me know.  Take care,  Inda Coke PA-C

## 2021-05-27 NOTE — Patient Instructions (Signed)
Holly Phillips  05/27/2021     @PREFPERIOPPHARMACY @   Your procedure is scheduled on  05/31/2021.   Report to [facility] at  0830 A.M.   Call this number if you have problems the morning of surgery:  [phone]   Remember:  Do not eat or drink after midnight.     Use your nebulizer and your inhaler before you come and bring your rescue inhaler with you.      Take these medicines the morning of surgery with A SIP OF WATER               flexeril or zanaflex (if needed), lexapro, gabapentin, omeprazole, oxycodone (if needed), prednisone.    Do not wear jewelry, make-up or nail polish.  Do not wear lotions, powders, or perfumes, or deodorant.  Do not shave 48 hours prior to surgery.  Men may shave face and neck.  Do not bring valuables to the hospital.  [facility] is not responsible for any belongings or valuables.  Contacts, dentures or bridgework may not be worn into surgery.  Leave your suitcase in the car.  After surgery it may be brought to your room.  For patients admitted to the hospital, discharge time will be determined by your treatment team.  Patients discharged the day of surgery will not be allowed to drive home and must  have someone with them for 24 hours.    Special instructions:   DO NOT smoke tobacco or vape for 24 hours before your procedure.  Please read over the following fact sheets that you were given. Coughing and Deep Breathing, Surgical Site Infection Prevention, Anesthesia Post-op Instructions, and Care and Recovery After Surgery      Sentinel Lymph Node Biopsy in Breast Cancer Treatment, Care After This sheet gives you information about how to care for yourself after your procedure. Your health care provider may also give you more specific instructions. If you have problems or questions, contact your health care provider. What can I expect after the procedure? After the procedure, it is common to have: Blue urine and darker  stool for the next 24 hours. This is normal. It is caused by the dye that was used during the procedure. Blue skin at the injection site. This may last for up to 8 weeks. Numbness, tingling, or pain near your incision. Swelling or bruising near your incision. Follow these instructions at home: Activity Avoid activities that take a lot of effort. Return to your normal activities as told by your health care provider. Ask your health care provider what activities are safe for you. Incision care  Follow instructions from your health care provider about how to take care of your incision. Make sure you: Wash your hands with soap and water for at least 20 seconds before and after you change your bandage (dressing). If soap and water are not available, use hand sanitizer. Change your dressing as told by your health care provider. Leave stitches (sutures), skin glue, or adhesive strips in place. These skin closures may need to stay in place for 2 weeks or longer. If adhesive strip edges start to loosen and curl up, you may trim the loose edges. Do not remove adhesive strips completely unless your health care provider tells you to do that. Do not take baths, swim, or use a hot tub until your health care provider approves. Ask your health care provider if you can take showers. You may be able  to shower 24 hours after your procedure. Check your biopsy site every day for signs of infection. Check for: More redness, swelling, or pain. Fluid or blood. Warmth. Pus or a bad smell. General instructions If you were given a surgical bra, wear it for the next 48 hours. You may remove the bra to shower. Take over-the-counter and prescription medicines only as told by your health care provider. You may resume your regular diet. Do not have your blood pressure taken or have blood drawn from the arm on the side of the biopsy until your health care provider says it is okay. You may need to be screened for extra fluid  around the lymph nodes and swelling in the breast and arm (lymphedema). Follow instructions from your health care provider about how often you should be checked. Keep all follow-up visits as told by your health care provider. This is important. Contact a health care provider if you have: Nausea and vomiting. Any of these signs of infection: More redness, swelling, or pain around your biopsy site. Fluid or blood coming from your incision. Warmth coming from your incision area. Pus or a bad smell coming from your incision. Any new bruising. Chills or a fever. Get help right away if you have: Pain that is getting worse and your medicine is not helping. Vomiting that will not stop. Chest pain or trouble breathing. Summary After the procedure, it is common to have blue urine and darker stool for the next 24 hours. This is normal. It is caused by the dye that was used during the procedure. Follow instructions from your health care provider about how to take care of your incision. Do not have your blood pressure taken or have blood drawn from the arm on the side of the biopsy until your health care provider says it is okay. You may need to be screened for extra fluid around the lymph nodes and swelling in the breast and arm (lymphedema). Follow instructions from your health care provider about how often you should be checked. This information is not intended to replace advice given to you by your health care provider. Make sure you discuss any questions you have with your health care provider. Document Revised: 12/20/2018 Document Reviewed: 12/20/2018 Elsevier Patient Education  [place] Anesthesia, Adult, Care After This sheet gives you information about how to care for yourself after your procedure. Your health care provider may also give you more specific instructions. If you have problems or questions, contact your health care provider. What can I expect after the  procedure? After the procedure, the following side effects are common: Pain or discomfort at the IV site. Nausea. Vomiting. Sore throat. Trouble concentrating. Feeling cold or chills. Feeling weak or tired. Sleepiness and fatigue. Soreness and body aches. These side effects can affect parts of the body that were not involved in surgery. Follow these instructions at home: For the time period you were told by your health care provider:  Rest. Do not participate in activities where you could fall or become injured. Do not drive or use machinery. Do not drink alcohol. Do not take sleeping pills or medicines that cause drowsiness. Do not make important decisions or sign legal documents. Do not take care of children on your own. Eating and drinking Follow any instructions from your health care provider about eating or drinking restrictions. When you feel hungry, start by eating small amounts of foods that are soft and easy to digest (bland), such as toast.  Gradually return to your regular diet. Drink enough fluid to keep your urine pale yellow. If you vomit, rehydrate by drinking water, juice, or clear broth. General instructions If you have sleep apnea, surgery and certain medicines can increase your risk for breathing problems. Follow instructions from your health care provider about wearing your sleep device: Anytime you are sleeping, including during daytime naps. While taking prescription pain medicines, sleeping medicines, or medicines that make you drowsy. Have a responsible adult stay with you for the time you are told. It is important to have someone help care for you until you are awake and alert. Return to your normal activities as told by your health care provider. Ask your health care provider what activities are safe for you. Take over-the-counter and prescription medicines only as told by your health care provider. If you smoke, do not smoke without supervision. Keep all  follow-up visits as told by your health care provider. This is important. Contact a health care provider if: You have nausea or vomiting that does not get better with medicine. You cannot eat or drink without vomiting. You have pain that does not get better with medicine. You are unable to pass urine. You develop a skin rash. You have a fever. You have redness around your IV site that gets worse. Get help right away if: You have difficulty breathing. You have chest pain. You have blood in your urine or stool, or you vomit blood. Summary After the procedure, it is common to have a sore throat or nausea. It is also common to feel tired. Have a responsible adult stay with you for the time you are told. It is important to have someone help care for you until you are awake and alert. When you feel hungry, start by eating small amounts of foods that are soft and easy to digest (bland), such as toast. Gradually return to your regular diet. Drink enough fluid to keep your urine pale yellow. Return to your normal activities as told by your health care provider. Ask your health care provider what activities are safe for you. This information is not intended to replace advice given to you by your health care provider. Make sure you discuss any questions you have with your health care provider. Document Revised: 01/19/2020 Document Reviewed: 08/18/2019 Elsevier Patient Education  2022 [place]. How to Use Chlorhexidine for Bathing Chlorhexidine gluconate (CHG) is a germ-killing (antiseptic) solution that is used to clean the skin. It can get rid of the bacteria that normally live on the skin and can keep them away for about 24 hours. To clean your skin with CHG, you may be given: A CHG solution to use in the shower or as part of a sponge bath. A prepackaged cloth that contains CHG. Cleaning your skin with CHG may help lower the risk for infection: While you are staying in the intensive care unit  of the hospital. If you have a vascular access, such as a central line, to provide short-term or long-term access to your veins. If you have a catheter to drain urine from your bladder. If you are on a ventilator. A ventilator is a machine that helps you breathe by moving air in and out of your lungs. After surgery. What are the risks? Risks of using CHG include: A skin reaction. Hearing loss, if CHG gets in your ears and you have a perforated eardrum. Eye injury, if CHG gets in your eyes and is not rinsed out. The CHG product  catching fire. Make sure that you avoid smoking and flames after applying CHG to your skin. Do not use CHG: If you have a chlorhexidine allergy or have previously reacted to chlorhexidine. On babies younger than 25 months of age. How to use CHG solution Use CHG only as told by your health care provider, and follow the instructions on the label. Use the full amount of CHG as directed. Usually, this is one bottle. During a shower Follow these steps when using CHG solution during a shower (unless your health care provider gives you different instructions): Start the shower. Use your normal soap and shampoo to wash your face and hair. Turn off the shower or move out of the shower stream. Pour the CHG onto a clean washcloth. Do not use any type of brush or rough-edged sponge. Starting at your neck, lather your body down to your toes. Make sure you follow these instructions: If you will be having surgery, pay special attention to the part of your body where you will be having surgery. Scrub this area for at least 1 minute. Do not use CHG on your head or face. If the solution gets into your ears or eyes, rinse them well with water. Avoid your genital area. Avoid any areas of skin that have broken skin, cuts, or scrapes. Scrub your back and under your arms. Make sure to wash skin folds. Let the lather sit on your skin for 1-2 minutes or as long as told by your health care  provider. Thoroughly rinse your entire body in the shower. Make sure that all body creases and crevices are rinsed well. Dry off with a clean towel. Do not put any substances on your body afterward--such as powder, lotion, or perfume--unless you are told to do so by your health care provider. Only use lotions that are recommended by the manufacturer. Put on clean clothes or pajamas. If it is the night before your surgery, sleep in clean sheets.  During a sponge bath Follow these steps when using CHG solution during a sponge bath (unless your health care provider gives you different instructions): Use your normal soap and shampoo to wash your face and hair. Pour the CHG onto a clean washcloth. Starting at your neck, lather your body down to your toes. Make sure you follow these instructions: If you will be having surgery, pay special attention to the part of your body where you will be having surgery. Scrub this area for at least 1 minute. Do not use CHG on your head or face. If the solution gets into your ears or eyes, rinse them well with water. Avoid your genital area. Avoid any areas of skin that have broken skin, cuts, or scrapes. Scrub your back and under your arms. Make sure to wash skin folds. Let the lather sit on your skin for 1-2 minutes or as long as told by your health care provider. Using a different clean, wet washcloth, thoroughly rinse your entire body. Make sure that all body creases and crevices are rinsed well. Dry off with a clean towel. Do not put any substances on your body afterward--such as powder, lotion, or perfume--unless you are told to do so by your health care provider. Only use lotions that are recommended by the manufacturer. Put on clean clothes or pajamas. If it is the night before your surgery, sleep in clean sheets. How to use CHG prepackaged cloths Only use CHG cloths as told by your health care provider, and follow the instructions on  the label. Use the  CHG cloth on clean, dry skin. Do not use the CHG cloth on your head or face unless your health care provider tells you to. When washing with the CHG cloth: Avoid your genital area. Avoid any areas of skin that have broken skin, cuts, or scrapes. Before surgery Follow these steps when using a CHG cloth to clean before surgery (unless your health care provider gives you different instructions): Using the CHG cloth, vigorously scrub the part of your body where you will be having surgery. Scrub using a back-and-forth motion for 3 minutes. The area on your body should be completely wet with CHG when you are done scrubbing. Do not rinse. Discard the cloth and let the area air-dry. Do not put any substances on the area afterward, such as powder, lotion, or perfume. Put on clean clothes or pajamas. If it is the night before your surgery, sleep in clean sheets.  For general bathing Follow these steps when using CHG cloths for general bathing (unless your health care provider gives you different instructions). Use a separate CHG cloth for each area of your body. Make sure you wash between any folds of skin and between your fingers and toes. Wash your body in the following order, switching to a new cloth after each step: The front of your neck, shoulders, and chest. Both of your arms, under your arms, and your hands. Your stomach and groin area, avoiding the genitals. Your right leg and foot. Your left leg and foot. The back of your neck, your back, and your buttocks. Do not rinse. Discard the cloth and let the area air-dry. Do not put any substances on your body afterward--such as powder, lotion, or perfume--unless you are told to do so by your health care provider. Only use lotions that are recommended by the manufacturer. Put on clean clothes or pajamas. Contact a health care provider if: Your skin gets irritated after scrubbing. You have questions about using your solution or cloth. You swallow  any chlorhexidine. Call your local poison control center (1-725 864 1169 in the [place] Get help right away if: Your eyes itch badly, or they become very red or swollen. Your skin itches badly and is red or swollen. Your hearing changes. You have trouble seeing. You have swelling or tingling in your mouth or throat. You have trouble breathing. These symptoms may represent a serious problem that is an emergency. Do not wait to see if the symptoms will go away. Get medical help right away. Call your local emergency services (911 in the [place] Do not drive yourself to the hospital. Summary Chlorhexidine gluconate (CHG) is a germ-killing (antiseptic) solution that is used to clean the skin. Cleaning your skin with CHG may help to lower your risk for infection. You may be given CHG to use for bathing. It may be in a bottle or in a prepackaged cloth to use on your skin. Carefully follow your health care provider's instructions and the instructions on the product label. Do not use CHG if you have a chlorhexidine allergy. Contact your health care provider if your skin gets irritated after scrubbing. This information is not intended to replace advice given to you by your health care provider. Make sure you discuss any questions you have with your health care provider. Document Revised: 07/16/2020 Document Reviewed: 07/16/2020 Elsevier Patient Education  2022 [employer].

## 2021-05-28 ENCOUNTER — Other Ambulatory Visit (HOSPITAL_COMMUNITY): Payer: Self-pay | Admitting: General Surgery

## 2021-05-28 ENCOUNTER — Ambulatory Visit (HOSPITAL_COMMUNITY)
Admission: RE | Admit: 2021-05-28 | Discharge: 2021-05-28 | Disposition: A | Discharge status: Home / Self Care | Payer: Medicare Other | Source: Ambulatory Visit | Attending: General Surgery | Admitting: General Surgery

## 2021-05-28 DIAGNOSIS — R928 Other abnormal and inconclusive findings on diagnostic imaging of breast: Secondary | ICD-10-CM

## 2021-05-28 IMAGING — US US PLC BREAST LOC DEV 1ST LESION INC US GUIDE*L*
1 series · 5 of 5 positions shown · non-contrast
Comparison: Previous exams.

CLINICAL DATA: 65-year-old female with recently diagnosed invasive
mammary carcinoma of the left breast presents for preoperative
radiofrequency tag localization.

EXAM:
RADIOFREQUENCY TAG LOCALIZATION OF THE LEFT BREAST WITH ULTRASOUND
GUIDANCE

[Series 1: us plc breast loc dev 1st lesion inc us guide*left · 0.06mm/px · 5 of 5 slices shown]
[im 1/5]
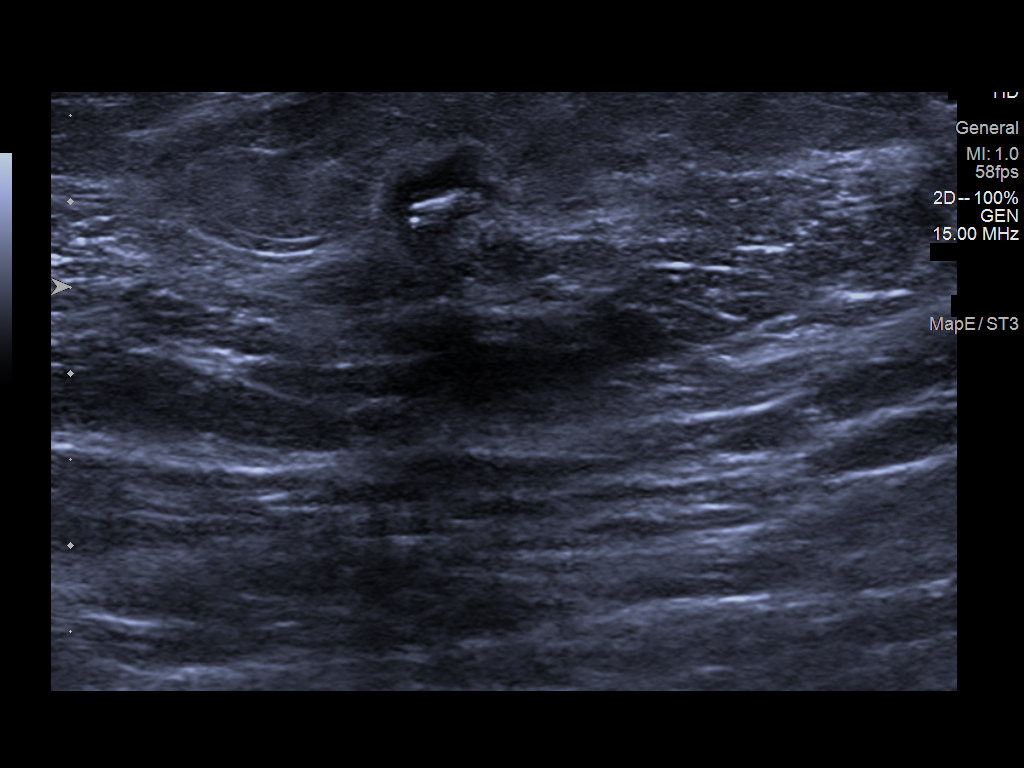
[im 2/5]
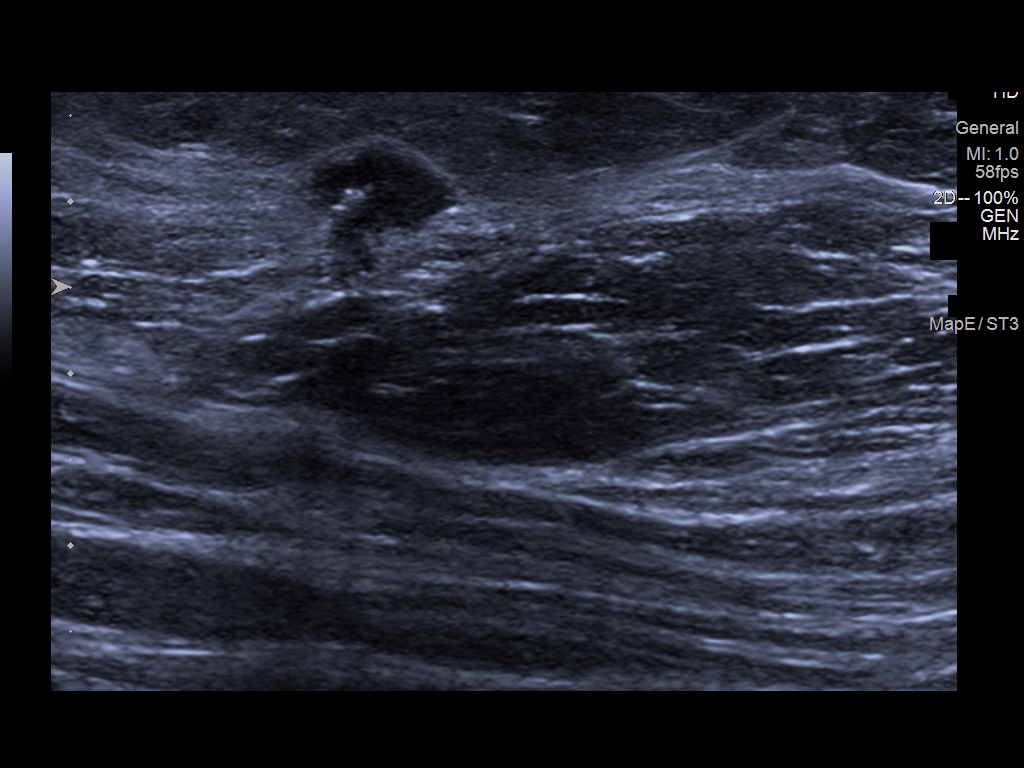
[im 3/5]
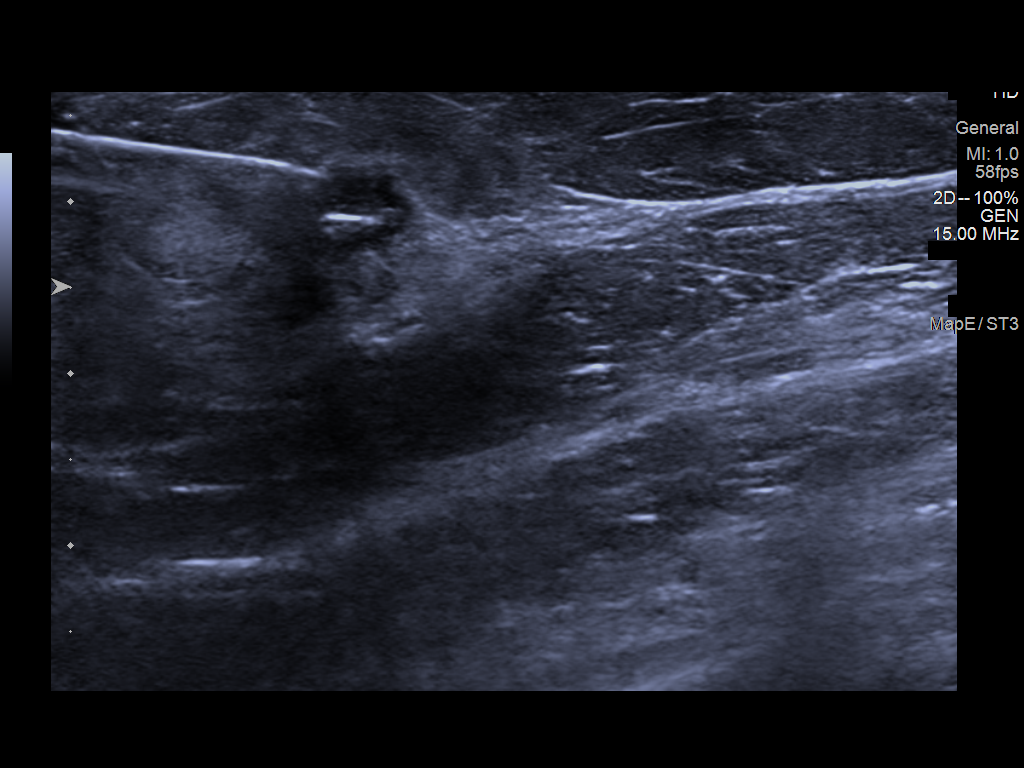
[im 4/5]
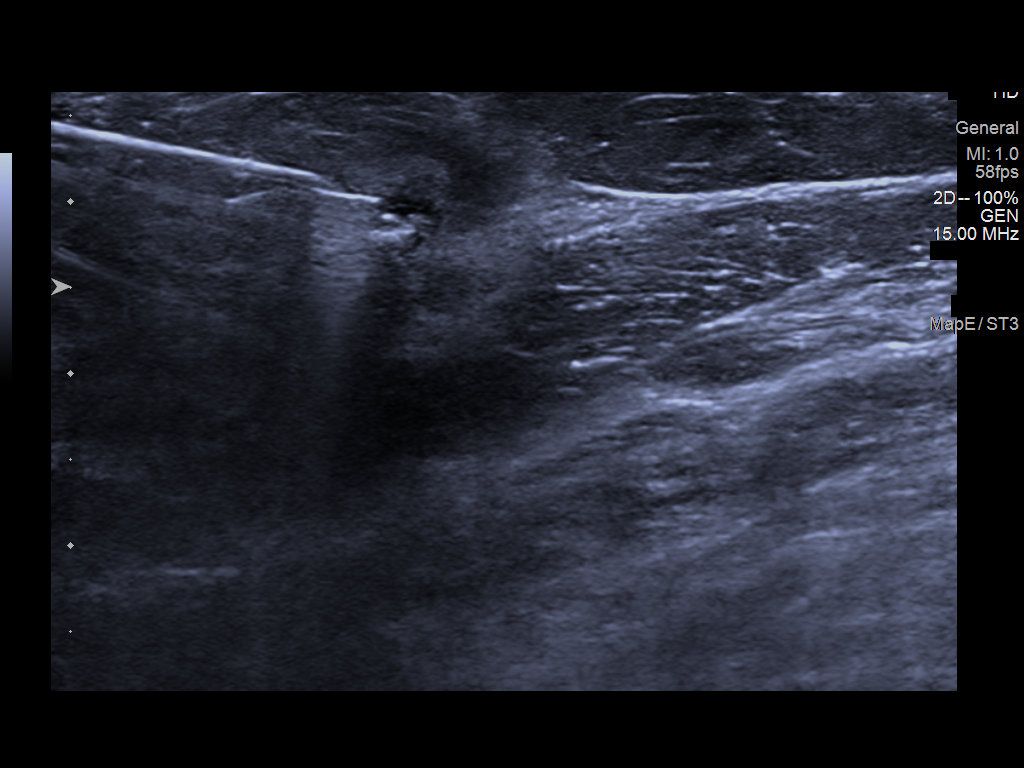
[im 5/5]
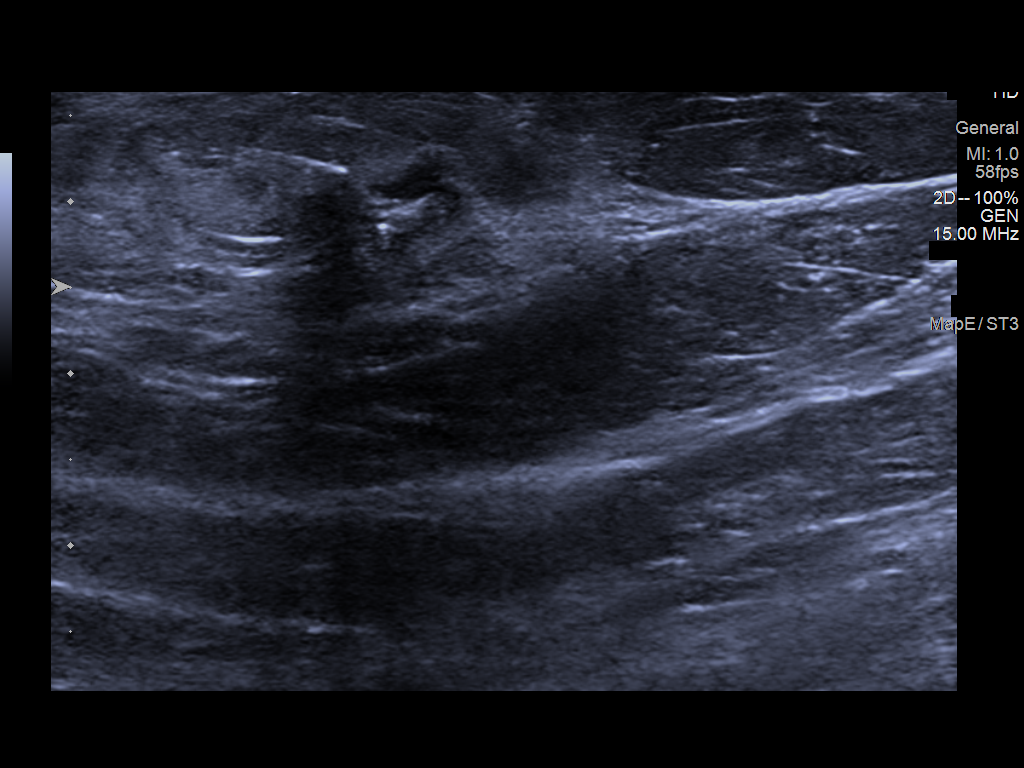

[5 of 5 positions shown; findings below may reference images not displayed]

FINDINGS: Patient presents for RF tag localization prior to lumpectomy. I met
with the patient and we discussed the procedure of RF tag
localization including benefits and alternatives. We discussed the
high likelihood of a successful procedure. We discussed the risks of
the procedure, including infection, bleeding, tissue injury, and
further surgery. Informed, written consent was given. The usual
time-out protocol was performed immediately prior to the procedure.

Using ultrasound guidance, sterile technique, 1% lidocaine and a 5
cm RF tag needle, the the mass in the left breast at the 1 o'clock
position containing the ribbon shaped biopsy marking clip was
localized using a medial to lateral approach. RF tag function was
confirmed. The images were marked for Dr. LOCKLEAR.
IMPRESSION: RF tag localization of the left breast. No apparent complications.

## 2021-05-28 IMAGING — MG MM DIGITAL DIAGNOSTIC UNILAT*L* W/ TOMO W/ CAD
4 series · 4 of 12 positions shown · non-contrast
Comparison: Previous exams.

CLINICAL DATA: Post ultrasound-guided RF tag placement site of
biopsy proven malignancy in the left breast at the 1 o'clock
position.

EXAM:
DIAGNOSTIC LEFT MAMMOGRAM POST ULTRASOUND-GUIDED RADIOFREQUENCY TAG
PLACEMENT

[L ML synth-2D]
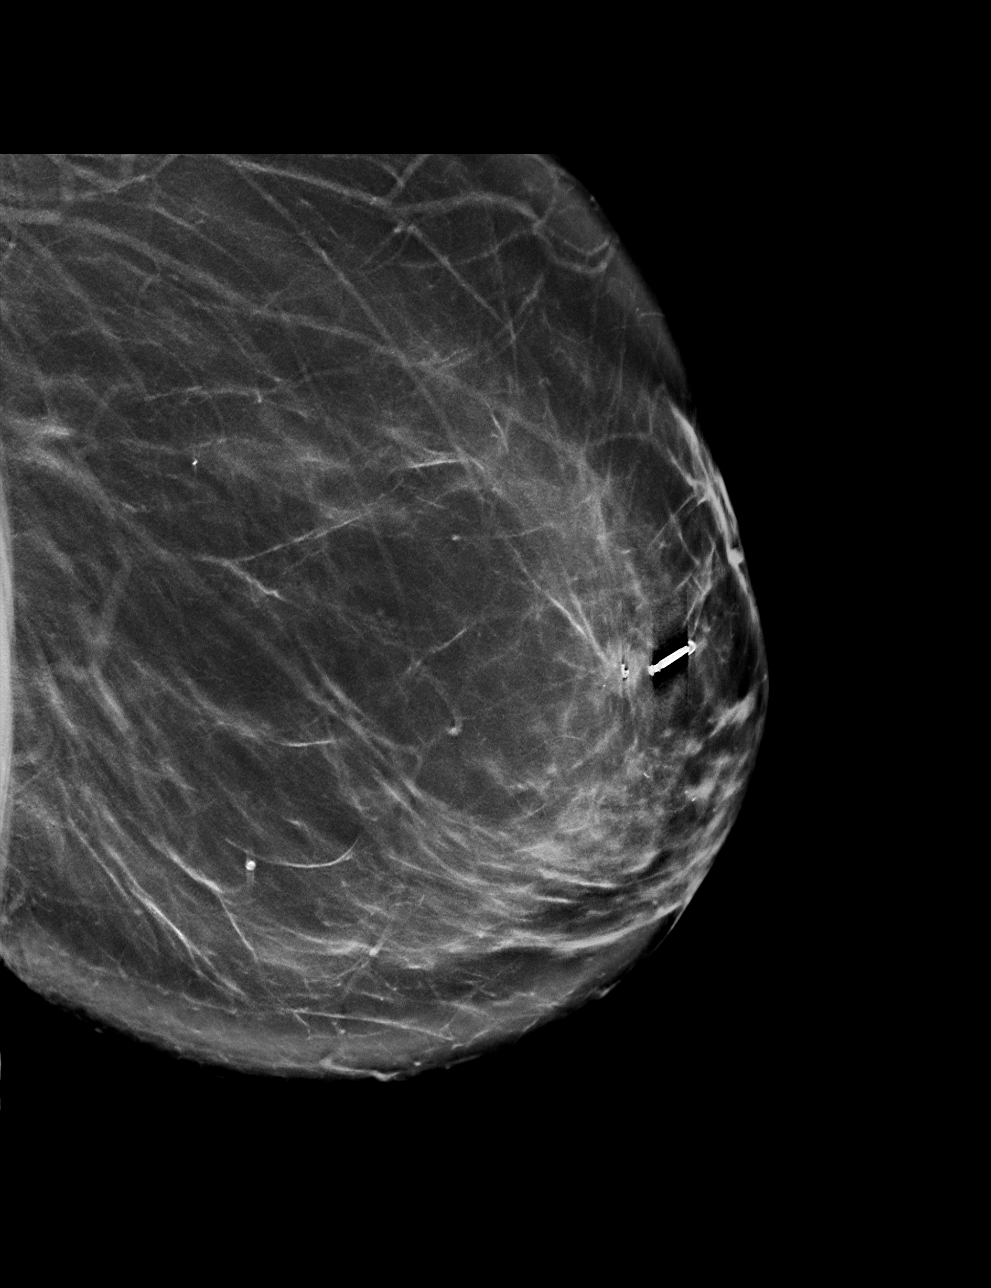

[L CC synth-2D]
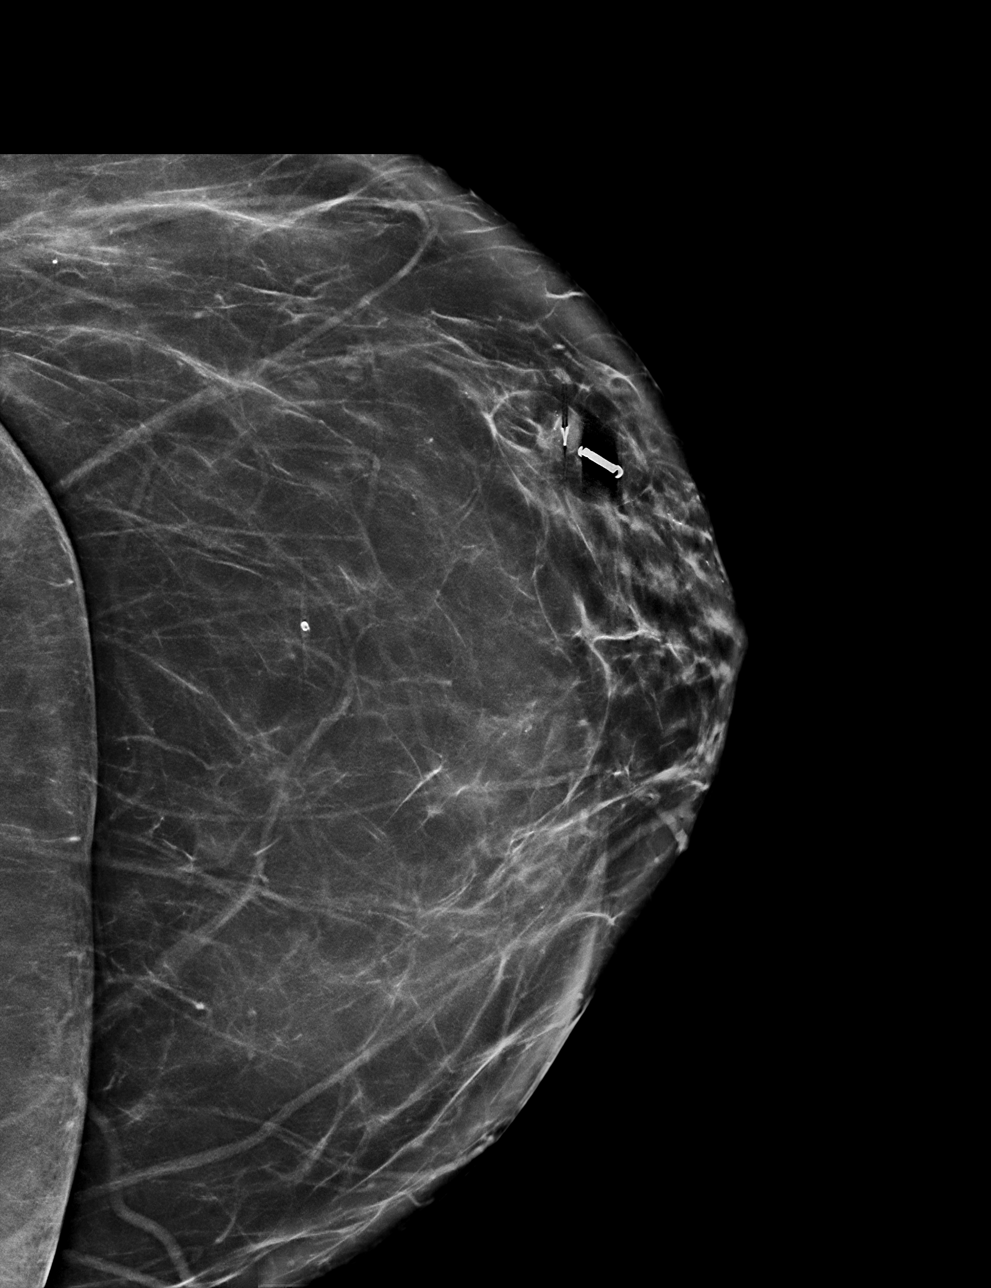

[L CC tomo · tomo slice 40/79.0]
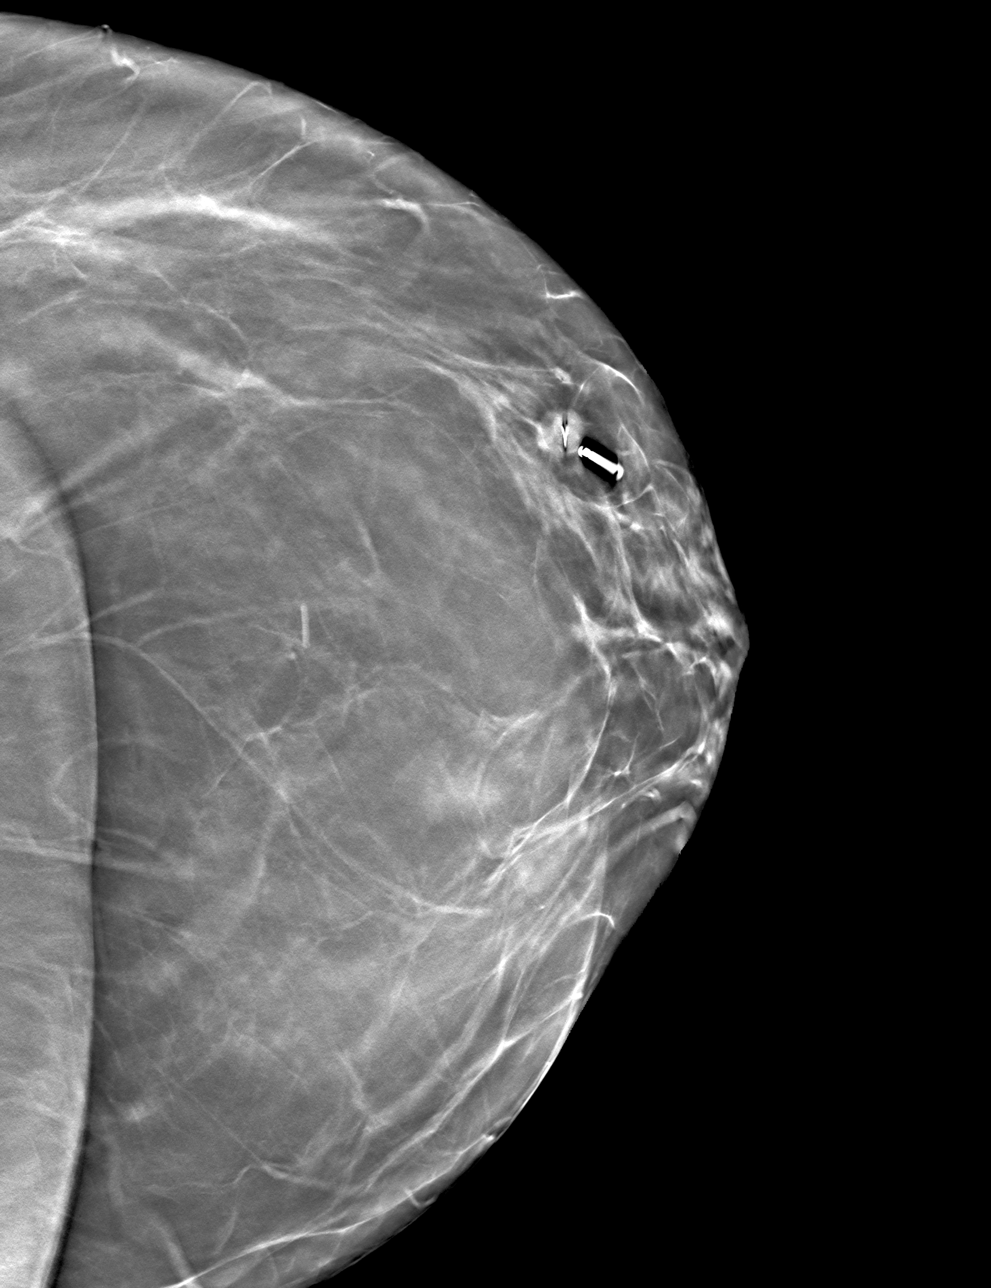

[L ML tomo · tomo slice 46/91.0]
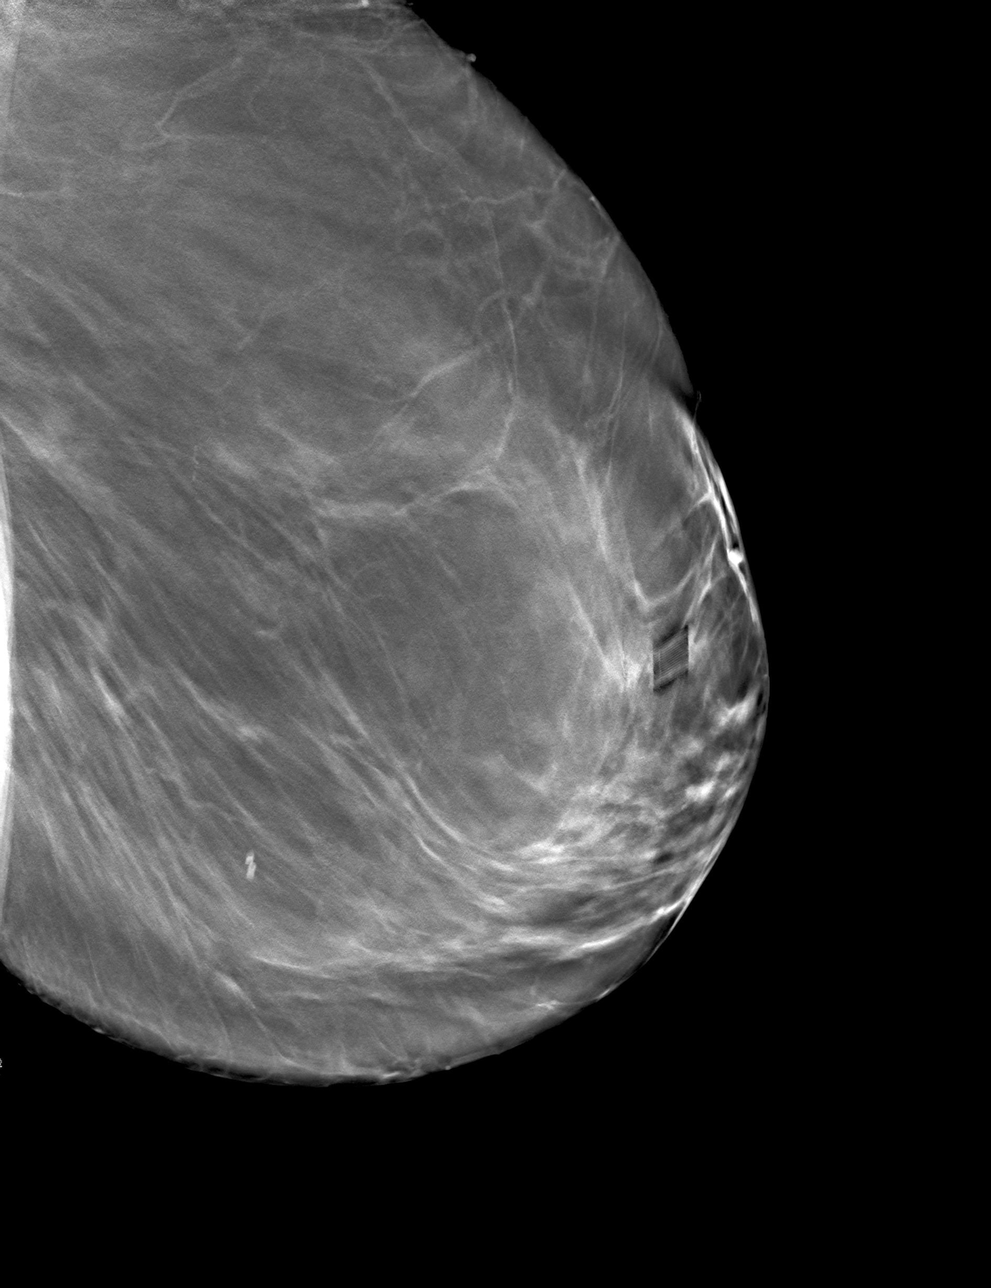

[4 of 12 positions shown; findings below may reference images not displayed]

FINDINGS: Mammographic images were obtained following ultrasound-guided
radiofrequency tag placement. The RF tag is positioned at the
anterior superior margin of the mass and adjacent to the ribbon
shaped biopsy marking clip in the left breast at the 1 o'clock
position.
IMPRESSION: Radiofrequency tag at anterior superior margin of biopsied mass and
adjacent to ribbon shaped biopsy marking clip in the left breast at
the 1 o'clock position.

Final Assessment: Post Procedure Mammograms for Seed Placement

## 2021-05-28 MED ORDER — LIDOCAINE-EPINEPHRINE (PF) 1 %-1:200000 IJ SOLN
INTRAMUSCULAR | Status: AC
  Filled 2021-05-28: qty 30

## 2021-05-28 MED ORDER — LIDOCAINE HCL (PF) 2 % IJ SOLN
INTRAMUSCULAR | Status: AC
  Filled 2021-05-28: qty 10

## 2021-05-29 ENCOUNTER — Other Ambulatory Visit (HOSPITAL_COMMUNITY): Payer: Medicare Other

## 2021-05-29 ENCOUNTER — Other Ambulatory Visit: Payer: Self-pay

## 2021-05-29 ENCOUNTER — Encounter (HOSPITAL_COMMUNITY)
Admission: RE | Admit: 2021-05-29 | Discharge: 2021-05-29 | Disposition: A | Discharge status: Home / Self Care | Payer: Medicare Other | Source: Ambulatory Visit | Attending: General Surgery | Admitting: General Surgery

## 2021-05-29 ENCOUNTER — Encounter (HOSPITAL_COMMUNITY): Payer: Self-pay

## 2021-05-29 DIAGNOSIS — Z01812 Encounter for preprocedural laboratory examination: Secondary | ICD-10-CM | POA: Insufficient documentation

## 2021-05-29 DIAGNOSIS — Z79899 Other long term (current) drug therapy: Secondary | ICD-10-CM | POA: Diagnosis not present

## 2021-05-29 HISTORY — DX: Gastro-esophageal reflux disease without esophagitis: K21.9

## 2021-05-29 HISTORY — DX: Sleep apnea, unspecified: G47.30

## 2021-05-29 LAB — BASIC METABOLIC PANEL
Anion gap: 6 (ref 5–15)
BUN: 10 mg/dL (ref 8–23)
CO2: 32 mmol/L (ref 22–32)
Calcium: 9.5 mg/dL (ref 8.9–10.3)
Chloride: 102 mmol/L (ref 98–111)
Creatinine, Ser: 0.68 mg/dL (ref 0.44–1.00)
GFR, Estimated: >60 mL/min (ref >60)
Glucose, Bld: 133 mg/dL — ABNORMAL HIGH (ref 70–99)
Potassium: 4.4 mmol/L (ref 3.5–5.1)
Sodium: 140 mmol/L (ref 135–145)

## 2021-05-31 ENCOUNTER — Ambulatory Visit (HOSPITAL_COMMUNITY): Payer: Medicare Other

## 2021-05-31 ENCOUNTER — Ambulatory Visit (HOSPITAL_COMMUNITY): Payer: Medicare Other | Admitting: Anesthesiology

## 2021-05-31 ENCOUNTER — Encounter (HOSPITAL_COMMUNITY): Payer: Self-pay | Admitting: General Surgery

## 2021-05-31 ENCOUNTER — Ambulatory Visit (HOSPITAL_COMMUNITY)
Admission: RE | Admit: 2021-05-31 | Discharge: 2021-05-31 | Disposition: A | Discharge status: Home / Self Care | Payer: Medicare Other | Attending: General Surgery | Admitting: General Surgery

## 2021-05-31 ENCOUNTER — Encounter (HOSPITAL_COMMUNITY)
Admission: RE | Admit: 2021-05-31 | Discharge: 2021-05-31 | Disposition: A | Discharge status: Home / Self Care | Payer: Medicare Other | Source: Ambulatory Visit | Attending: General Surgery | Admitting: General Surgery

## 2021-05-31 ENCOUNTER — Encounter (HOSPITAL_COMMUNITY)
Admission: RE | Disposition: A | Discharge status: Home / Self Care | Payer: Self-pay | Source: Home / Self Care | Attending: General Surgery

## 2021-05-31 DIAGNOSIS — K219 Gastro-esophageal reflux disease without esophagitis: Secondary | ICD-10-CM | POA: Insufficient documentation

## 2021-05-31 DIAGNOSIS — Z170 Estrogen receptor positive status [ER+]: Secondary | ICD-10-CM | POA: Diagnosis not present

## 2021-05-31 DIAGNOSIS — G4730 Sleep apnea, unspecified: Secondary | ICD-10-CM | POA: Diagnosis not present

## 2021-05-31 DIAGNOSIS — F418 Other specified anxiety disorders: Secondary | ICD-10-CM | POA: Diagnosis not present

## 2021-05-31 DIAGNOSIS — Z90710 Acquired absence of both cervix and uterus: Secondary | ICD-10-CM | POA: Diagnosis not present

## 2021-05-31 DIAGNOSIS — C50412 Malignant neoplasm of upper-outer quadrant of left female breast: Secondary | ICD-10-CM

## 2021-05-31 DIAGNOSIS — C50912 Malignant neoplasm of unspecified site of left female breast: Secondary | ICD-10-CM | POA: Insufficient documentation

## 2021-05-31 DIAGNOSIS — Z87891 Personal history of nicotine dependence: Secondary | ICD-10-CM | POA: Insufficient documentation

## 2021-05-31 DIAGNOSIS — Z8673 Personal history of transient ischemic attack (TIA), and cerebral infarction without residual deficits: Secondary | ICD-10-CM | POA: Diagnosis not present

## 2021-05-31 DIAGNOSIS — J449 Chronic obstructive pulmonary disease, unspecified: Secondary | ICD-10-CM | POA: Insufficient documentation

## 2021-05-31 DIAGNOSIS — N63 Unspecified lump in unspecified breast: Secondary | ICD-10-CM

## 2021-05-31 HISTORY — PX: PARTIAL MASTECTOMY WITH AXILLARY SENTINEL LYMPH NODE BIOPSY: SHX6004

## 2021-05-31 HISTORY — PX: BREAST LUMPECTOMY WITH RADIOFREQUENCY TAG IDENTIFICATION: SHX6884

## 2021-05-31 LAB — GLUCOSE, CAPILLARY
Glucose-Capillary: 96 mg/dL (ref 70–99)
Glucose-Capillary: 123 mg/dL — ABNORMAL HIGH (ref 70–99)

## 2021-05-31 IMAGING — MG MM BREAST SURGICAL SPECIMEN
1 series · 1 of 1 positions shown · non-contrast
Comparison: Previous exam(s).

CLINICAL DATA: Status post RF tag localized LEFT breast lumpectomy.

EXAM:
SPECIMEN RADIOGRAPH OF THE LEFT BREAST

[L SPECIMEN]
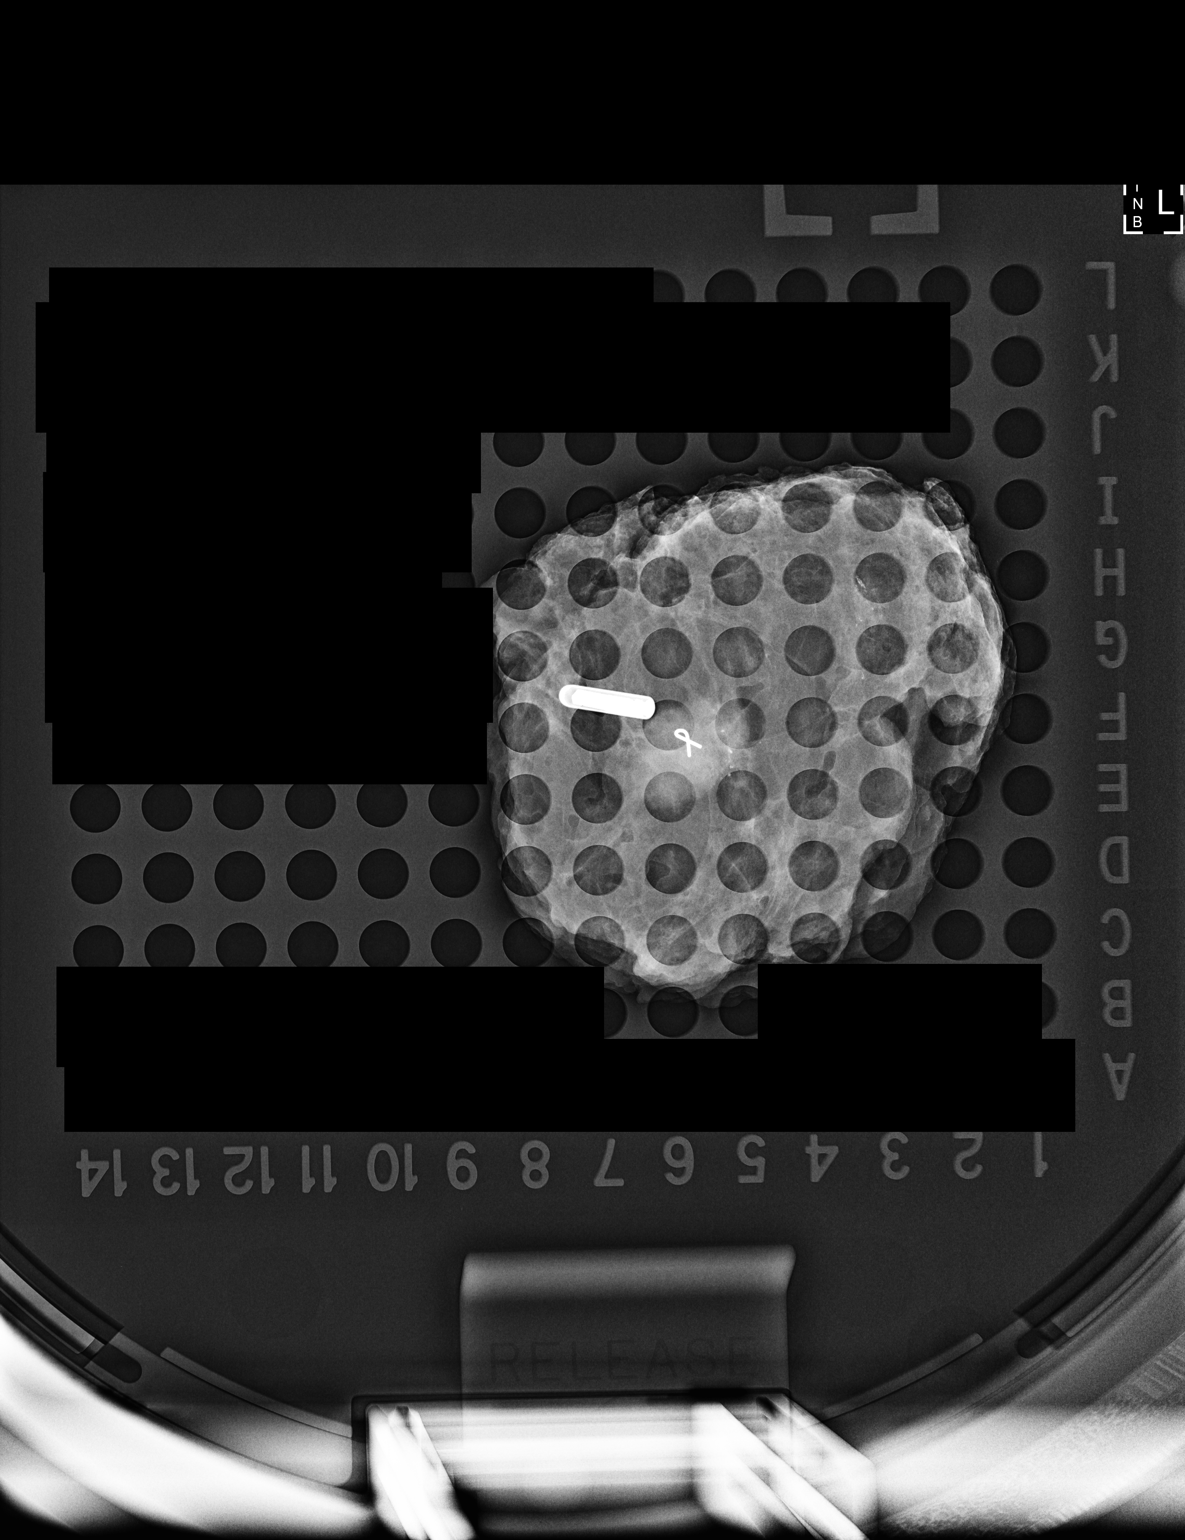

[1 of 1 positions shown; findings below may reference images not displayed]

FINDINGS: Status post excision of the LEFT breast. The RF tag and RIBBON
shaped clip are present within the specimen.
IMPRESSION: Specimen radiograph of the LEFT breast.

## 2021-05-31 SURGERY — PARTIAL MASTECTOMY WITH AXILLARY SENTINEL LYMPH NODE BIOPSY
Anesthesia: General | Site: Breast | Laterality: Left

## 2021-05-31 MED ORDER — EPHEDRINE 5 MG/ML INJ
INTRAVENOUS | Status: AC
  Filled 2021-05-31: qty 10

## 2021-05-31 MED ORDER — ONDANSETRON HCL 4 MG/2ML IJ SOLN: 4.0000 mg | Freq: Once | INTRAMUSCULAR | Status: DC | PRN

## 2021-05-31 MED ORDER — ONDANSETRON HCL 4 MG/2ML IJ SOLN
INTRAMUSCULAR | Status: DC | PRN
  Administered 2021-05-31: 4 mg via INTRAVENOUS

## 2021-05-31 MED ORDER — FENTANYL CITRATE (PF) 250 MCG/5ML IJ SOLN
INTRAMUSCULAR | Status: AC
  Filled 2021-05-31: qty 5

## 2021-05-31 MED ORDER — SODIUM CHLORIDE (PF) 0.9 % IJ SOLN
INTRAVENOUS | Status: DC | PRN
  Administered 2021-05-31: 3 mL via INTRAMUSCULAR

## 2021-05-31 MED ORDER — CHLORHEXIDINE GLUCONATE CLOTH 2 % EX PADS: 6.0000 | MEDICATED_PAD | Freq: Once | CUTANEOUS | Status: DC

## 2021-05-31 MED ORDER — TECHNETIUM TC 99M TILMANOCEPT KIT
1.0000 | PACK | Freq: Once | INTRAVENOUS | Status: AC | PRN
  Administered 2021-05-31: 1.046 via INTRADERMAL

## 2021-05-31 MED ORDER — LACTATED RINGERS IV SOLN
INTRAVENOUS | Status: DC
  Administered 2021-05-31: 1000 mL via INTRAVENOUS

## 2021-05-31 MED ORDER — PENTAFLUOROPROP-TETRAFLUOROETH EX AERO
INHALATION_SPRAY | CUTANEOUS | Status: AC
  Filled 2021-05-31: qty 116

## 2021-05-31 MED ORDER — EPHEDRINE SULFATE 50 MG/ML IJ SOLN
INTRAMUSCULAR | Status: DC | PRN
Start: 2021-05-31 — End: 2021-05-31
  Administered 2021-05-31 (×2): 10 mg via INTRAVENOUS

## 2021-05-31 MED ORDER — DEXAMETHASONE SODIUM PHOSPHATE 10 MG/ML IJ SOLN
INTRAMUSCULAR | Status: AC
  Filled 2021-05-31: qty 2

## 2021-05-31 MED ORDER — ACETAMINOPHEN 325 MG PO TABS
650.0000 mg | ORAL_TABLET | Freq: Once | ORAL | Status: AC
  Administered 2021-05-31: 650 mg via ORAL

## 2021-05-31 MED ORDER — FENTANYL CITRATE (PF) 100 MCG/2ML IJ SOLN
INTRAMUSCULAR | Status: DC | PRN
  Administered 2021-05-31: 50 ug via INTRAVENOUS
  Administered 2021-05-31: 100 ug via INTRAVENOUS
  Administered 2021-05-31 (×2): 50 ug via INTRAVENOUS

## 2021-05-31 MED ORDER — SODIUM CHLORIDE (PF) 0.9 % IJ SOLN
INTRAMUSCULAR | Status: AC
  Filled 2021-05-31: qty 10

## 2021-05-31 MED ORDER — CHLORHEXIDINE GLUCONATE 0.12 % MT SOLN
15.0000 mL | Freq: Once | OROMUCOSAL | Status: AC
  Administered 2021-05-31: 15 mL via OROMUCOSAL

## 2021-05-31 MED ORDER — ONDANSETRON HCL 4 MG/2ML IJ SOLN
INTRAMUSCULAR | Status: AC
  Filled 2021-05-31: qty 4

## 2021-05-31 MED ORDER — PROPOFOL 10 MG/ML IV BOLUS
INTRAVENOUS | Status: AC
  Filled 2021-05-31: qty 20

## 2021-05-31 MED ORDER — ACETAMINOPHEN 325 MG PO TABS
ORAL_TABLET | ORAL | Status: AC
  Filled 2021-05-31: qty 2

## 2021-05-31 MED ORDER — PROPOFOL 10 MG/ML IV BOLUS
INTRAVENOUS | Status: DC | PRN
Start: 2021-05-31 — End: 2021-05-31
  Administered 2021-05-31: 170 mg via INTRAVENOUS
  Administered 2021-05-31: 60 mg via INTRAVENOUS

## 2021-05-31 MED ORDER — LIDOCAINE HCL (PF) 2 % IJ SOLN
INTRAMUSCULAR | Status: AC
  Filled 2021-05-31: qty 10

## 2021-05-31 MED ORDER — IPRATROPIUM-ALBUTEROL 0.5-2.5 (3) MG/3ML IN SOLN
3.0000 mL | RESPIRATORY_TRACT | Status: DC
  Administered 2021-05-31: 3 mL via RESPIRATORY_TRACT
  Filled 2021-05-31: qty 3

## 2021-05-31 MED ORDER — ONDANSETRON HCL 4 MG PO TABS: 4.0000 mg | ORAL_TABLET | Freq: Three times a day (TID) | ORAL | 1 refills | Status: AC | PRN

## 2021-05-31 MED ORDER — PHENYLEPHRINE 40 MCG/ML (10ML) SYRINGE FOR IV PUSH (FOR BLOOD PRESSURE SUPPORT)
PREFILLED_SYRINGE | INTRAVENOUS | Status: AC
  Filled 2021-05-31: qty 20

## 2021-05-31 MED ORDER — ROCURONIUM 10MG/ML (10ML) SYRINGE FOR MEDFUSION PUMP - OPTIME
INTRAVENOUS | Status: DC | PRN
  Administered 2021-05-31: 50 mg via INTRAVENOUS

## 2021-05-31 MED ORDER — MIDAZOLAM HCL 2 MG/2ML IJ SOLN
INTRAMUSCULAR | Status: AC
  Filled 2021-05-31: qty 2

## 2021-05-31 MED ORDER — ROCURONIUM BROMIDE 10 MG/ML (PF) SYRINGE
PREFILLED_SYRINGE | INTRAVENOUS | Status: AC
  Filled 2021-05-31: qty 20

## 2021-05-31 MED ORDER — FENTANYL CITRATE PF 50 MCG/ML IJ SOSY: 25.0000 ug | PREFILLED_SYRINGE | INTRAMUSCULAR | Status: DC | PRN

## 2021-05-31 MED ORDER — CEFAZOLIN SODIUM-DEXTROSE 2-4 GM/100ML-% IV SOLN
2.0000 g | INTRAVENOUS | Status: AC
  Administered 2021-05-31: 2 g via INTRAVENOUS
  Filled 2021-05-31: qty 100

## 2021-05-31 MED ORDER — IPRATROPIUM-ALBUTEROL 0.5-2.5 (3) MG/3ML IN SOLN
RESPIRATORY_TRACT | Status: AC
  Filled 2021-05-31: qty 3

## 2021-05-31 MED ORDER — METHYLENE BLUE 0.5 % INJ SOLN
INTRAVENOUS | Status: AC
  Filled 2021-05-31: qty 10

## 2021-05-31 MED ORDER — BUPIVACAINE HCL (PF) 0.5 % IJ SOLN
INTRAMUSCULAR | Status: DC | PRN
  Administered 2021-05-31: 20 mL

## 2021-05-31 MED ORDER — LIDOCAINE HCL (CARDIAC) PF 50 MG/5ML IV SOSY
PREFILLED_SYRINGE | INTRAVENOUS | Status: DC | PRN
  Administered 2021-05-31: 80 mg via INTRAVENOUS

## 2021-05-31 MED ORDER — 0.9 % SODIUM CHLORIDE (POUR BTL) OPTIME
TOPICAL | Status: DC | PRN
  Administered 2021-05-31: 1000 mL

## 2021-05-31 MED ORDER — BUPIVACAINE HCL (PF) 0.5 % IJ SOLN
INTRAMUSCULAR | Status: AC
  Filled 2021-05-31: qty 30

## 2021-05-31 MED ORDER — ORAL CARE MOUTH RINSE: 15.0000 mL | Freq: Once | OROMUCOSAL | Status: AC

## 2021-05-31 MED ORDER — EPHEDRINE 5 MG/ML INJ
INTRAVENOUS | Status: AC
  Filled 2021-05-31: qty 5

## 2021-05-31 SURGICAL SUPPLY — 41 items
ADH SKN CLS APL DERMABOND .7 (GAUZE/BANDAGES/DRESSINGS) ×1
APL PRP STRL LF DISP 70% ISPRP (MISCELLANEOUS) ×2
APPLIER CLIP 9.375 SM OPEN (CLIP) ×2
APR CLP SM 9.3 20 MLT OPN (CLIP) ×1
BLADE SURG 15 STRL LF DISP TIS (BLADE) ×1 IMPLANT
BLADE SURG 15 STRL SS (BLADE) ×2
CHLORAPREP W/TINT 26 (MISCELLANEOUS) ×3 IMPLANT
CLIP APPLIE 9.375 SM OPEN (CLIP) ×1 IMPLANT
CLOTH BEACON ORANGE TIMEOUT ST (SAFETY) ×2 IMPLANT
COVER LIGHT HANDLE STERIS (MISCELLANEOUS) ×4 IMPLANT
COVER PROBE W GEL 5X96 (DRAPES) ×2 IMPLANT
COVER SURGICAL LIGHT HANDLE (MISCELLANEOUS) ×2 IMPLANT
DECANTER SPIKE VIAL GLASS SM (MISCELLANEOUS) ×2 IMPLANT
DERMABOND ADVANCED (GAUZE/BANDAGES/DRESSINGS) ×1
DERMABOND ADVANCED .7 DNX12 (GAUZE/BANDAGES/DRESSINGS) ×1 IMPLANT
ELECT REM PT RETURN 9FT ADLT (ELECTROSURGICAL) ×2
ELECTRODE REM PT RTRN 9FT ADLT (ELECTROSURGICAL) ×1 IMPLANT
GLOVE SURG ENC MOIS LTX SZ6.5 (GLOVE) ×2 IMPLANT
GLOVE SURG POLYISO LF SZ6.5 (GLOVE) ×1 IMPLANT
GLOVE SURG UNDER POLY LF SZ7 (GLOVE) ×6 IMPLANT
GOWN STRL REUS W/TWL LRG LVL3 (GOWN DISPOSABLE) ×4 IMPLANT
INST SET MINOR GENERAL (KITS) ×2 IMPLANT
KIT MARKER MARGIN INK (KITS) ×2 IMPLANT
KIT TURNOVER KIT A (KITS) ×2 IMPLANT
MANIFOLD NEPTUNE II (INSTRUMENTS) ×2 IMPLANT
MARKER SKIN DUAL TIP RULER LAB (MISCELLANEOUS) ×2 IMPLANT
NDL HYPO 18GX1.5 BLUNT FILL (NEEDLE) ×1 IMPLANT
NDL HYPO 25X1 1.5 SAFETY (NEEDLE) ×2 IMPLANT
NEEDLE HYPO 18GX1.5 BLUNT FILL (NEEDLE) ×2 IMPLANT
NEEDLE HYPO 25X1 1.5 SAFETY (NEEDLE) ×4 IMPLANT
NS IRRIG 1000ML POUR BTL (IV SOLUTION) ×2 IMPLANT
PACK MINOR (CUSTOM PROCEDURE TRAY) ×2 IMPLANT
PAD ARMBOARD 7.5X6 YLW CONV (MISCELLANEOUS) ×4 IMPLANT
SET BASIN LINEN APH (SET/KITS/TRAYS/PACK) ×2 IMPLANT
SET LOCALIZER 20 PROBE US (MISCELLANEOUS) ×2 IMPLANT
SUT MNCRL AB 4-0 PS2 18 (SUTURE) ×4 IMPLANT
SUT SILK 2 0 SH (SUTURE) ×2 IMPLANT
SUT VIC AB 3-0 SH 27 (SUTURE) ×4
SUT VIC AB 3-0 SH 27X BRD (SUTURE) ×2 IMPLANT
SYR BULB IRRIG 60ML STRL (SYRINGE) ×2 IMPLANT
SYR CONTROL 10ML LL (SYRINGE) ×4 IMPLANT

## 2021-05-31 NOTE — Anesthesia Preprocedure Evaluation (Signed)
Anesthesia Evaluation  Patient identified by MRN, date of birth, ID band Patient awake    Reviewed: Allergy & Precautions, H&P , NPO status , Patient's Chart, lab work & pertinent test results, reviewed documented beta blocker date and time   Airway Mallampati: II  TM Distance: >3 FB Neck ROM: full    Dental no notable dental hx.    Pulmonary asthma , sleep apnea , COPD, Patient abstained from smoking., former smoker,    Pulmonary exam normal breath sounds clear to auscultation       Cardiovascular Exercise Tolerance: Good negative cardio ROS   Rhythm:regular Rate:Normal     Neuro/Psych Anxiety Depression TIAnegative psych ROS   GI/Hepatic Neg liver ROS, GERD  Medicated,  Endo/Other  negative endocrine ROS  Renal/GU negative Renal ROS  negative genitourinary   Musculoskeletal   Abdominal   Peds  Hematology negative hematology ROS (+)   Anesthesia Other Findings   Reproductive/Obstetrics negative OB ROS                             Anesthesia Physical Anesthesia Plan  ASA: 3  Anesthesia Plan: General and General ETT   Post-op Pain Management:    Induction:   PONV Risk Score and Plan: Ondansetron  Airway Management Planned:   Additional Equipment:   Intra-op Plan:   Post-operative Plan:   Informed Consent: I have reviewed the patients History and Physical, chart, labs and discussed the procedure including the risks, benefits and alternatives for the proposed anesthesia with the patient or authorized representative who has indicated his/her understanding and acceptance.     Dental Advisory Given  Plan Discussed with: CRNA  Anesthesia Plan Comments:         Anesthesia Quick Evaluation

## 2021-05-31 NOTE — Transfer of Care (Signed)
Immediate Anesthesia Transfer of Care Note  Patient: Educational psychologist  Procedure(s) Performed: PARTIAL MASTECTOMY WITH AXILLARY SENTINEL LYMPH NODE BIOPSY (Left: Breast) BREAST LUMPECTOMY WITH RADIOFREQUENCY TAG IDENTIFICATION (Left: Breast)  Patient Location: PACU  Anesthesia Type:General  Level of Consciousness: awake  Airway & Oxygen Therapy: Patient Spontanous Breathing  Post-op Assessment: Report given to RN  Post vital signs: Reviewed and stable  Last Vitals:  Vitals Value Taken Time  BP 155/136 05/31/21 1315  Temp 36.5 C 05/31/21 1315  Pulse 110 05/31/21 1321  Resp 21 05/31/21 1322  SpO2 97 % 05/31/21 1321  Vitals shown include unvalidated device data.  Last Pain:  Vitals:   05/31/21 1315  TempSrc:   PainSc: 0-No pain      Patients Stated Pain Goal: 6 (35/43/01 4840)  Complications: No notable events documented.

## 2021-05-31 NOTE — Anesthesia Postprocedure Evaluation (Signed)
Anesthesia Post Note  Patient: Educational psychologist  Procedure(s) Performed: PARTIAL MASTECTOMY WITH AXILLARY SENTINEL LYMPH NODE BIOPSY (Left: Breast) BREAST LUMPECTOMY WITH RADIOFREQUENCY TAG IDENTIFICATION (Left: Breast)  Patient location during evaluation: Phase II Anesthesia Type: General Level of consciousness: awake Pain management: pain level controlled Vital Signs Assessment: post-procedure vital signs reviewed and stable Respiratory status: spontaneous breathing and respiratory function stable Cardiovascular status: blood pressure returned to baseline and stable Postop Assessment: no headache and no apparent nausea or vomiting Anesthetic complications: no Comments: Late entry   No notable events documented.   Last Vitals:  Vitals:   05/31/21 0911 05/31/21 1315  BP: (!) 150/74 (!) 155/136  Pulse: 79 (!) 115  Resp: (!) 22 18  Temp: 36.7 C 36.5 C  SpO2: 93% 100%    Last Pain:  Vitals:   05/31/21 1315  TempSrc:   PainSc: 0-No pain                 Louann Sjogren

## 2021-05-31 NOTE — Interval H&P Note (Signed)
History and Physical Interval Note:  05/31/2021 10:22 AM  Holly Phillips  has presented today for surgery, with the diagnosis of left breast cancer.  The various methods of treatment have been discussed with the patient and family. After consideration of risks, benefits and other options for treatment, the patient has consented to  Procedure(s): PARTIAL MASTECTOMY WITH AXILLARY SENTINEL LYMPH NODE BIOPSY (Left) BREAST LUMPECTOMY WITH RADIOFREQUENCY TAG IDENTIFICATION (Left) as a surgical intervention.  The patient's history has been reviewed, patient examined, no change in status, stable for surgery.  I have reviewed the patient's chart and labs.  Questions were answered to the patient's satisfaction.     Virl Cagey

## 2021-05-31 NOTE — Discharge Instructions (Addendum)
Discharge instructions after breast surgery:   Common Complaints: Pain and bruising at the incision sites.  Swelling at the incision sites. Stiffness of the arm.   Diet/ Activity: Diet as tolerated.  You may shower but do not take hot showers as this can disrupt the glue. Rest and listen to your body, but do not remain in bed all day.  Walk everyday for at least 15-20 minutes. Deep cough and move around every 1-2 hours in the first few days after surgery.  Do not lift > 10 lbs for the first 2 weeks after surgery. Do not do anything that makes you feel like you are putting unnecessary pull or stretch on the incision sites.  Do move your arm and shoulder (see exercises options below). If you do not move then you can get stiff and hurt more.  Do not pick at the dermabond glue on your incision sites.  This glue film will remain in place for 1-2 weeks and will start to peel off.  Do not place lotions or balms on your incision unless instructed to specifically by Dr. Constance Haw.   Pain Expectations and Narcotics: -After surgery you will have pain associated with your incisions and this is normal. The pain is muscular and nerve pain, and will get better with time. -You are encouraged and expected to take non narcotic medications like tylenol and ibuprofen (when able) to treat pain as multiple modalities can aid with pain treatment. -Narcotics are only used when pain is severe or there is breakthrough pain. -You are not expected to have a pain score of 0 after surgery, as we cannot prevent pain. A pain score of 3-4 that allows you to be functional, move, walk, and tolerate some activity is the goal. The pain will continue to improve over the days after surgery and is dependent on your surgery. -Due to [place] law, we are only able to give a certain amount of pain medication to treat post operative pain, and we only give additional narcotics on a patient by patient basis.  -For most laparoscopic surgery,  studies have shown that the majority of patients only need 10-15 narcotic pills, and for open surgeries most patients only need 15-20.   -Having appropriate expectations of pain and knowledge of pain management with non narcotics is important as we do not want anyone to become addicted to narcotic pain medication.  -Using ice packs in the first 48 hours and heating pads after 48 hours, wearing an abdominal binder (when recommended), and using over the counter medications are all ways to help with pain management.   -Simple acts like meditation and mindfulness practices after surgery can also help with pain control and research has proven the benefit of these practices.  Medication: Take tylenol and ibuprofen as needed for pain control, alternating every 4-6 hours.  Example:  Tylenol 1000mg  @ 6am, 12noon, 6pm, 36midnight (Do not exceed 4000mg  of tylenol a day). Ibuprofen 800mg  @ 9am, 3pm, 9pm, 3am (Do not exceed 3600mg  of ibuprofen a day).  Take your home percocet as needed.  Take Colace for constipation related to narcotic pain medication. If you do not have a bowel movement in 2 days, take Miralax over the counter.  Drink plenty of water to also prevent constipation.   Contact Information: If you have questions or concerns, please call our office, [phone], Monday- Thursday 8AM-5PM and Friday 8AM-12Noon.  If it is after hours or on the weekend, please call [facility], [phone], [phone], and ask  to speak to the surgeon on call for Dr. Constance Haw at [facility].   Exercises After Breast Surgery Do at least a few of the exercises below twice a day. It is ok to start the day after surgery and gradually build up the amount and type of exercises you do. Link to the exercises with pictures ([email or web address]).   Deep Breathing Exercise Deep breathing can help you relax and ease discomfort and tightness around your  incision (surgical cut). Its also a very good way to relieve stress during the day.  Sit comfortably in a chair. Take a slow, deep breath through your nose. Let your chest and belly expand. Breathe out slowly through your mouth. Repeat as many times as needed.  Arm and Shoulder Exercises Doing arm and shoulder exercises will help you get back your full range of motion on your affected side (the side where you had your surgery). With full range of motion, youll be able to: Move your arm over your head and out to the side Move your arm behind your neck Move your arm to the middle of your back Do each of the exercises below 5 times a day. Keep doing this until you have a full range of motion again and can use your arm as you did before surgery in all your normal activities. This includes activities at work, at home, and in recreation or sports. If you had limited movement in your arm before surgery, your goal will be to get back as much movement as you had before.  If you get your full range of motion back quickly, keep doing these exercises once a day instead of 5 times a day. This is especially true if you feel any tightness in your chest, shoulder, or under your affected arm. These exercises can help keep scar tissue from forming in your armpit and shoulder. Scar tissue can limit your arm movements later.  If you still have trouble moving your shoulder 4 weeks after your surgery, tell your surgeon. Theyll tell you if you need more rehabilitation, such as physical or occupational therapy.  If you had one of the following surgeries, you can do the following set of exercises on the first day after your surgery, as long as your surgeon tells you its safe.  Shoulder rolls The shoulder roll is a good exercise to start with because it gently stretches your chest and shoulder muscles.  Stand or sit comfortably with your arms relaxed at your sides. Start with backward shoulder rolls. In a circular  motion, bring your shoulders forward, up, backward, and down. Do this 10 times. Switch directions and do 10 forward shoulder rolls. Bring your shoulders backward, up, forward, and down. Do this 10 times. Try to make the circles as big as you can and move both shoulders at the same time. If you have some tightness across your incision or chest, start with smaller circles and make them bigger as the tightness decreases. The backward direction might feel a little tighter across your chest than the forward direction. This will get better with practice.  Shoulder wings The shoulder wings exercise will help you get back outward movement of your shoulder. You can do this exercise while sitting or standing.  Place your hands on your chest or collarbone. Raise your elbows out to the side, limiting your range of motion as instructed by your healthcare team. Slowly lower your elbows. Do this 10 times. Then, slowly lower your hands. If you feel discomfort while  doing this exercise, hold your position and do the deep breathing exercise. If the discomfort passes, raise your elbows a little higher. If it doesnt pass, dont raise your elbows any higher. Finish the exercise raising your elbows only high enough to feel a gentle stretch and no discomfort.  Arm circles If you had surgery on both breasts, do this exercise with both arms, 1 arm at a time. Dont do this exercise with both arms at the same time. This will put too much pressure on your chest.  Stand with your feet slightly apart for balance. Raise your affected arm out to the side as high as you can, limiting your range of movement as instructed by your healthcare team. Start making slow, backward circles in the air with your arm. Make sure youre moving your arm from your shoulder, not your elbow. Keep your elbow straight. Increase the size of the circles until theyre as big as you can comfortably make them, limiting your range of motion as instructed  by your healthcare team. If you feel any aching or if your arm is tired, take a break. Keep doing the exercise when you feel better. Do 10 full backward circles. Then, slowly lower your arm to your side. Rest your arm for a moment. Follow steps 1 to 4 again, but this time make slow, forward circles.  W exercise You can do the W exercise while sitting or standing.  Form a W with your arms out to the side and palms facing forward (see Figure 4). Try to bring your hands up so theyre even with your face. If you cant raise your arms that high, bring them to the highest comfortable position. Make sure to limit your range of motion as instructed by your healthcare team. Pinch your shoulder blades together and downward, as if youre squeezing a pencil between them. If you feel discomfort, stop at that position and do the deep breathing exercise. If the discomfort passes, try to bring your arms back a little further. If it doesnt pass, dont reach any further. Hold the furthest position that doesnt cause discomfort. Squeeze your shoulder blades together and downward for 5 seconds. Slowly bring your arms back down to the starting position. Repeat this movement 10 times.  Back Climb You can do the back climb stretch while sitting or standing. Youll need a timer or stopwatch.  Place your hands behind your back. Hold the hand on your affected side with your other hand. If you had surgery on both breasts, use the arm that moves most easily to hold the other. Slowly slide your hands up the center of your back as far as you can. If you feel tightness near your incision, stop at that position and do the deep breathing exercise. If the tightness decreases, try to slide your hands up a little further. If it doesnt decrease, dont slide your hands up any further. Hold the highest position you can for 1 minute. Use your stopwatch or timer to keep track. You should feel a gentle stretch in your shoulder  area. After 1 minute, slowly lower your hands.  Hands behind neck You can do the hands behind neck stretch while sitting or standing. Youll need a timer or stopwatch.  Clasp your hands together on your lap or in front of you. Slowly raise your hands toward your head, keeping your elbows together in front of you, not out to the sides. Keep your head level. Dont bend your neck or head forward.  Slide your hands over your head until you reach the back of your neck. When you get to this point, spread your elbows out to the sides. Hold this position for 1 minute. Use your stopwatch or timer to keep track. Breathe normally. Dont hold your breath as you stretch your body. If you have some tightness across your incision or chest, hold your position and do the deep breathing exercise. If the tightness decreases, continue with the movement. If the tightness stays the same, reach up and stretch your elbows back as best as you can without causing discomfort. Hold the position youre most comfortable in for 1 minute. Slowly come out of the stretch by bringing your elbows together and sliding your hands over your head. Then, slowly lower your arms.  Forward wall crawls Youll need 2 pieces of tape for the forward wall crawl exercise.  Stand facing a wall. Your toes should be about 6 inches (15 centimeters) from the wall. Reach as high as you can with your unaffected arm. [place]ta [place] that point with a piece of tape. This will be the goal for your affected arm. If you had surgery on both breasts, set your goal using the arm that moves most comfortably. Place both hands against the wall at a level thats comfortable. Crawl your fingers up the wall as far as you can, keeping them even with each other.. Try not to look up toward your hands or arch your back. When you get to the point where you feel a good stretch, but not pain, do the deep breathing exercise. Return to the starting position by crawling your fingers back  down the wall. Repeat the wall crawl 10 times. Each time you raise your hands, try to crawl a little bit higher. On the 10th crawl, use the other piece of tape to mark the highest point you reached with your affected arm. This will let you to see your progress each time you do this exercise. As you become more flexible, you may need to take a step closer to the wall so you can reach a little higher.   Side wall crawls Youll also need 2 pieces of tape for the side wall crawl exercise.  You shouldnt feel pain while doing this exercise. Its normal to feel some tightness or pulling across the side of your chest. Focus on your breathing until the tightness decreases. Breathe normally throughout this exercise. Dont hold your breath.  Be careful not to turn your body toward the wall while doing this exercise. Make sure only the side of your body faces the wall.  If you had surgery on both breasts, start with step 3.  Stand with your unaffected side closest to the wall, about 1 foot (30.5 centimeters) away from the wall. Reach as high as you can with your unaffected arm. [place]ta [place] that point with a piece of tape (see Figure 8). This will be the goal for your affected arm. Turn your body so your affected side is now closest to the wall. If you had surgery on both breasts, start with either side closest to the wall. Crawl your fingers up the wall as far as you can. When you get to the point where you feel a good stretch, but not pain, do the deep breathing exercise. Return to the starting position by crawling your fingers back down the wall. Repeat this exercise 10 times. On your 10th crawl, use a piece of tape to mark the highest point you reached with  your affected arm. This will let you see your progress each time you do the exercise. If you had surgery on both breasts, repeat the exercise with your other arm.  Swelling After your surgery, you may have some swelling or puffiness in your hand or arm on  your affected side. This is normal and usually goes away on its own.  If you notice swelling in your hand or arm, follow the tips below to help the swelling go away.  Raise your arm above your head and do hand pumps several times a day. To do hand pumps, slowly open and close your fist 10 times. This will help drain the fluid out of your arm. Dont hold your arm straight up over your head for more than a few minutes. This can cause your arm muscles to get tired. Raise your arm to the side a few times a day for about 20 minutes at a time. To do this, sit or lie down on your back. Rest your arm on a few pillows next to you so its raised above the level of your heart. If youre able to sleep on your unaffected side, you can place 1 or 2 pillows in front of you and rest your affected arm on them while you sleep. If the swelling doesnt go down within 4 to 6 weeks, call your surgeon or nurse.      Keep Oxygen on at all times for the next few days and while taking narcotic pain medicine

## 2021-05-31 NOTE — Op Note (Signed)
[facility] Operative Note  05/31/21  Preoperative Diagnosis:  Left breast cancer    Postoperative Diagnosis: Same   Procedure(s) Performed: Left breast partial mastectomy after radiofrequency tag placement, sentinel lymph node biopsy    Surgeon: Lanell Matar. Constance Haw, MD   Assistants: No qualified resident was available    Anesthesia: General endotracheal   Anesthesiologist: Louann Sjogren, MD    Specimens:  Left axilla sentinel nodes X5, left breast mass (painted (anterior margin blue, inferior green, lateral orange, medial yellow, posterior black, superior red; 2  sutures at the anterior/ superior edge where tag was seen poking out and tissue re-approximated to not lose it); additional medial margin (closest to original specimen yellow, furthest from original specimen orange); additional superior margin (closest to original specimen yellow, furthest from original specimen orange)   Estimated Blood Loss: Minimal   Blood Replacement: None    Complications: None   Wound Class: Clean    Operative Indications: Ms. Rasmussen is a 66 yo with left sided breast cancer. We discussed partial mastectomy and sentinel node after radiofrequency tag placement and risk of bleeding, infection, lymphedema, needing more surgery and additional therapies.   Findings:Left breast mass with tag and clip in specimen mammogram    Procedure: The patient was taken to the operating room and placed supine. General endotracheal anesthesia was induced. Intravenous antibiotics were administered per protocol.  A time out was performed. The radiofrequency tag had been placed days prior and the radiotracer was injected around the areola the morning of surgery. Methylene blue was injected at 12, 3, 6, and 9 o'clock and the breast was massaged for 5 minutes.  The left breast and axilla were prepared and draped in the usual sterile fashion.   The gamma probe was used to identify the hottest spot in  the axilla and an incision was made. This was carried down and a hot blue node was noted. In vivo counts were 5212. The node was excised using sharp dissection and cautery and vessels and lymphatics were clipped. Ex vivo was 2465.  An additional hot node was noted but was not blue and appeared to be several nodes in a chain (at least 2), in vivo the counts were 2605. The node was excised in the same fashion and ex vivo the counts were 245.  I tested the field again and had readings over 200, so I looked for another node. I found another hot, small node that had in vivo counts of 2993 and this was excised in the same fashion. Ex vivo the counts were 2100. After this was removed, the bed counts still read over 200, and I finally found a blue hot node that was more inferior and medial to the other nodes and in vivo counts were 10050. It was excised in a similar fashion and the ex vivo counts were 11171.  The bed counts following excision were under 60.  No additional palpable or blue nodes were noted. The axilla was packed for hemostasis.  Attention was turned to her breast, and she had two areas on the superior breast from her tag placement where the steri-strips caused blisters. This was far from the site where the radiofrequency localizer noted the tag to be closest to the skin. A circumareolar incision was made in the lateral part of the areola and carried down to the breast tissue. Flaps were raised. Using the localizer, I excised the breast tissue around the tag. In the superior/ anterior margin, I had readings of  the tag being 1cm area, and then noted the tip of the tag. I re-approximated the tissue in this region with 2 silk sutures to prevent the tag from falling out.  The specimen was then removed in its entirety and the tag was located in the specimen. The methylene blue from the 3 o'clock injection had bled under the dermis and stained the anterior margin. Given this the paint margins were changed from  the paper and instructions /comments were sent to pathology (see above for the paint margins specifics). This was sent to mammogram and the specimen contained the clip and the tag. Additional medial and superior margins were taken given that the tag was so close to the superior margin, and these were painted with yellow closest to the specimen and orange furthest from the specimen. They were sent separately.    The cavity was made hemostatic, clips were placed to mark the cavity. Local was injected in both incisions.  The axilla was closed with 3-0 deep Vicryl to close the space and 3-0 Vicryl in the dermis. The skin was closed with 4-0 Monocryl subcuticular.  Dermabond closed the skin. The breast incision was using 3-0 Vicryl interrupted on the flaps and the skin was closed with 4-0 Monocryl subcuticular and dermabond. The cavity was left open for seroma formation for cosmesis.   Final inspection revealed acceptable hemostasis. All counts were correct at the end of the case. The patient was awakened from anesthesia and extubated without complication.  The patient went to the PACU in stable condition.   [name], MD [facility] [address] [place], [place] [address] [phone] (office)

## 2021-05-31 NOTE — Anesthesia Procedure Notes (Signed)
Procedure Name: Intubation Date/Time: 05/31/2021 10:39 AM Performed by: Ollen Bowl, CRNA Pre-anesthesia Checklist: Patient identified, Patient being monitored, Timeout performed, Emergency Drugs available and Suction available Patient Re-evaluated:Patient Re-evaluated prior to induction Oxygen Delivery Method: Circle System Utilized Preoxygenation: Pre-oxygenation with 100% oxygen Induction Type: IV induction Ventilation: Mask ventilation without difficulty Laryngoscope Size: Mac and 3 Grade View: Grade II Tube type: Oral Tube size: 7.0 mm Number of attempts: 1 Airway Equipment and Method: stylet Placement Confirmation: ETT inserted through vocal cords under direct vision, positive ETCO2 and breath sounds checked- equal and bilateral Secured at: 21 cm Tube secured with: Tape Dental Injury: Teeth and Oropharynx as per pre-operative assessment

## 2021-05-31 NOTE — Progress Notes (Addendum)
[facility]  Updated patient's sister. Will call with results. Will have some blue urine, and will see back in clinic in 2 weeks. Patient has home narcotics.   [name], MD [facility] [address] [place], [place] [address] [phone] (office)

## 2021-05-31 NOTE — Progress Notes (Signed)
Sentinel node injection completed left breast by radiology. Noted on admission this am 2 small non draining round,  raw areas where steri strips had been applied after tag placement.

## 2021-06-03 ENCOUNTER — Encounter (HOSPITAL_COMMUNITY): Payer: Self-pay | Admitting: General Surgery

## 2021-06-03 ENCOUNTER — Encounter: Payer: Medicare Other | Admitting: Acute Care

## 2021-06-03 LAB — SURGICAL PATHOLOGY

## 2021-06-04 ENCOUNTER — Ambulatory Visit (HOSPITAL_COMMUNITY): Admission: RE | Admit: 2021-06-04 | Payer: Medicare Other | Source: Ambulatory Visit

## 2021-06-04 NOTE — Progress Notes (Signed)
Called patient to updated her on the pathology and had to leave a VM. Called her sister and got patient on the phone. Lymph nodes all negative. Cancer completely removed. DCIS in additional medial margin (0.3 mm from margin) but completely removed also.   Will be getting radiation, so I do not suspect additional resection is needed given the size of the cancer and dcis. Will get referred to Oncology.  Says she is doing ok. She says she has some minor bruising but nothing major. Pain is ok but is on chronic pain medication.   FINAL MICROSCOPIC DIAGNOSIS:   A. LYMPH NODE, SENTINEL, LEFT, BIOPSY:  - Lymph node, negative for malignancy (0/1).   B. LYMPH NODE, SENTINEL #2, LEFT, BIOPSY:  - Lymph node, negative for malignancy (0/1).   C. LYMPH NODE, SENTINEL #3, LEFT, BIOPSY:  - Lymph node, negative for malignancy (0/1).   D. LYMPH NODE, SENTINEL #4, LEFT, BIOPSY:  - Lymph node, negative for malignancy (0/1).   E. LYMPH NODE, SENTINEL #5, LEFT, BIOPSY:  - Lymph node, negative for malignancy (0/1).   F. 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   G. BREAST, LEFT, SUPERIOR MARGIN, EXCISION:  - Breast tissue, negative for malignancy.   H. BREAST, LEFT, MEDIAL MARGIN, EXCISION:  - Ductal carcinoma in situ (DCIS), nuclear grade III (high), with  central necrosis and calcifications, 8 mm in greatest dimension.  - Margin negative for DCIS.  - Distance from DCIS to closest margin: 0.3 mm.   INVASIVE CARCINOMA OF THE BREAST: Resection   Procedure: Excision (less than total mastectomy)  Specimen Laterality: Left  Histologic Type: Invasive carcinoma of no special type (ductal)  Histologic Grade:    Glandular (Acinar)/Tubular Differentiation: Score 3    Nuclear Pleomorphism: Score 3    Mitotic Rate: Score 2    Overall Grade: Grade 3  Tumor  Size: 8 mm in greatest dimension.  Ductal Carcinoma In Situ: Present  Tumor Extent: Estimated size at least 8 mm in greatest dimension  Treatment Effect in the Breast: No known presurgical therapy  Margins: All margins negative for invasive carcinoma    Distance from Closest Margin (mm): 8 mm    Specify Closest Margin (required only if <70mm): Superior margin  DCIS Margins: Uninvolved by DCIS    Distance from Closest Margin (mm): 0.3 mm    Specify Closest Margin (required only if <76mm): Medial margin  Regional Lymph Nodes: Regional lymph nodes present.    Number of Lymph Nodes Examined: 5    Number of Sentinel Nodes Examined: 5    Number of Lymph Nodes with Macrometastases (>2 mm): 0    Number of Lymph Nodes with Micrometastases: 0    Number of Lymph Nodes with Isolated Tumor Cells (=0.2 mm or =200  cells): 0  Distant Metastasis:    Distant Site(s) Involved: Not applicable  Breast Biomarker Testing Performed on Previous Biopsy:    Testing Performed on Case Number: [id number]       Estrogen Receptor: 5%, Positive, weak intensity staining       Progesterone Receptor: 0%, Negative       HER2 (IHC): Equivocal, 2+       HER2 (FISH): Group 5, HER2 Negative       Ki-67: 30%

## 2021-06-13 ENCOUNTER — Encounter: Payer: Self-pay | Admitting: General Surgery

## 2021-06-13 ENCOUNTER — Ambulatory Visit (INDEPENDENT_AMBULATORY_CARE_PROVIDER_SITE_OTHER): Payer: Medicare Other | Admitting: General Surgery

## 2021-06-13 ENCOUNTER — Other Ambulatory Visit: Payer: Self-pay

## 2021-06-13 VITALS — BP 112/65 | HR 90 | Temp 98.3°F | Resp 18 | Ht 60.0 in | Wt 152.0 lb

## 2021-06-13 DIAGNOSIS — C50412 Malignant neoplasm of upper-outer quadrant of left female breast: Secondary | ICD-10-CM

## 2021-06-13 DIAGNOSIS — Z170 Estrogen receptor positive status [ER+]: Secondary | ICD-10-CM

## 2021-06-13 NOTE — Progress Notes (Signed)
[facility]  Doing well. Incisions healing and no pain. Some minor soreness under the axilla some and swelling. Says it is getting better.  BP 112/65    Pulse 90    Temp 98.3 F (36.8 C) (Other (Comment))    Resp 18    Ht 5' (1.524 m)    Wt 152 lb (68.9 kg)    LMP  (LMP Unknown)    SpO2 93%    BMI 29.69 kg/m  Axilla with c/d/I incision on the left and some seroma formation, left breast incision c/d/I without erythema or drainage  Patient s/p partial mastectomy and sentinel node for invasive cancer, lymph nodes negative, additional medial margin with DCIS but margin clear. Will be getting radiation. Do no think additional excision needed.  Referral to Dr. Jarold Song for further recommendations  Discussed option of seroma aspiration if needed, she will let us know if she thinks she needs that    Future Appointments  Date Time Provider [place]  06/25/2021 10:30 AM Inda Coke, [place] [facility]  07/01/2021  1:30 PM Derek Jack, MD [facility] None   [name], MD [facility] [address] [place], [place] [id number] [phone] (office)

## 2021-06-13 NOTE — Patient Instructions (Addendum)
Will get you referred to Dr. Delton Coombes for Oncology and to get scheduled for radiation and any additional treatment. You had a tiny area of invasive 14mm (less than 1cm in size) and a small area of in situ cancer (earliest of cancer). This was all removed. No cancer remains.  Your lymph nodes were negative for cancer.   Call if you continue to have issues with your armpit and need your seroma aspirated (fluid removed from it) in the office.   Seroma A seroma is a collection of fluid on the body that looks like swelling or a mass. Seromas form where tissue has been injured or cut. Seromas vary in size. Some are small and painless. Others may become large and cause pain or discomfort. Many seromas go away on their own as the fluid is naturally absorbed by the body, and some seromas need to be drained. What are the causes? Seromas form as the result of damage to tissue or the removal of tissue. This tissue damage may happen during surgery or because of an injury or trauma. When tissue is disrupted or removed, empty space is created. The body's natural defense system (immune system) causes fluid to enter the empty space and form a seroma. What are the signs or symptoms? Symptoms of this condition include: Swelling at the site of a surgical incision or an injury. Drainage of clear fluid at the surgery or injury site. Discomfort or pain. How is this diagnosed? This condition is diagnosed based on: Your symptoms. Your medical history. A physical exam. During the exam, your health care provider will press on the seroma. You may also have tests, including: Blood tests. Imaging tests, such as an ultrasound or a CT scan. How is this treated? Some seromas go away on their own. Your health care provider may monitor you to make sure the seroma does not cause any problems. If your seroma does not go away on its own, treatment may include: Using a needle to drain the fluid from the seroma (needle  aspiration). Inserting a small, thin tube (catheter) to drain the fluid. Applying a bandage (dressing), such as an elastic bandage or binder. Taking antibiotic medicines, if the seroma becomes infected. In rare cases, surgery may be done to remove the seroma and repair the area. Follow these instructions at home:  If you were prescribed an antibiotic medicine, take it as told by your health care provider. Do not stop using the antibiotic even if you start to feel better. Take over-the-counter and prescription medicines only as told by your health care provider. Return to your normal activities as told by your health care provider. Ask your health care provider what activities are safe for you. Check your seroma every day for signs of infection. Check for: Redness or pain. More swelling. More fluid. Warmth. Pus or a bad smell. Keep all follow-up visits as told by your health care provider. This is important. Contact a health care provider if: You have a fever. You have redness or pain at the site of the seroma. Your seroma is more swollen or is getting bigger. You have more fluid coming from the seroma. Your seroma feels warm to the touch. You have pus or a bad smell coming from the seroma. Get help right away if: You have a fever along with severe pain, redness at the site, or chills. You feel confused or have trouble staying awake. You feel like your heart is beating very fast. You feel short of breath or  are breathing rapidly. You have cool, clammy, or sweaty skin. Summary Seromas can form as a result of injury or surgery on tissue. Seromas can cause swelling, drainage of clear fluid, and discomfort or pain at the site of the tissue injury. Some seromas go away on their own. Other seromas may need to be drained. Check your seroma every day for signs of infection, such as pain, more swelling, more fluid, warmth, or pus or a bad smell. This information is not intended to replace  advice given to you by your health care provider. Make sure you discuss any questions you have with your health care provider. Document Revised: 03/28/2019 Document Reviewed: 03/28/2019 Elsevier Patient Education  2022 [employer].

## 2021-06-25 ENCOUNTER — Other Ambulatory Visit: Payer: Self-pay

## 2021-06-25 ENCOUNTER — Encounter: Payer: Self-pay | Admitting: Physician Assistant

## 2021-06-25 ENCOUNTER — Ambulatory Visit (INDEPENDENT_AMBULATORY_CARE_PROVIDER_SITE_OTHER): Payer: Medicare Other | Admitting: Physician Assistant

## 2021-06-25 VITALS — BP 130/74 | HR 83 | Temp 97.3°F | Ht 60.0 in | Wt 153.2 lb

## 2021-06-25 DIAGNOSIS — E663 Overweight: Secondary | ICD-10-CM | POA: Diagnosis not present

## 2021-06-25 DIAGNOSIS — J449 Chronic obstructive pulmonary disease, unspecified: Secondary | ICD-10-CM

## 2021-06-25 DIAGNOSIS — M545 Low back pain, unspecified: Secondary | ICD-10-CM | POA: Diagnosis not present

## 2021-06-25 DIAGNOSIS — G8929 Other chronic pain: Secondary | ICD-10-CM

## 2021-06-25 MED ORDER — TIZANIDINE HCL 4 MG PO TABS: 4.0000 mg | ORAL_TABLET | Freq: Three times a day (TID) | ORAL | 1 refills | Status: DC

## 2021-06-25 NOTE — Progress Notes (Signed)
Holly Phillips is a 66 y.o. female here for a follow up of COPD.  History of Present Illness:   Chief Complaint  Patient presents with   COPD   Headache    Pt is c/o headache everyday for the past week. She wakes up in the morning with a headache and last till afternoon. Starts at base of head and radiates up to the middle of her head   COPD  Currently compliant with using Spiriva 2.5 mcg 2 puff in AM, Symbicort 4.5 mcg inhaler- 2 puffs in the AM and PM, singulair 10 mg daily, and albuterol 108 mcg base inhaler as needed with no complications. Holly Phillips states that since starting the Spiriva she has noticed her wheezing has improved and she is not needing to use her albuterol inhaler as often. Denies CP or SOB.   Chronic Back Pain  Pt is compliant with taking tizanidine 4 mg and flexeril 10 mg TID daily with no complications. At this time she is managing well and will need a refill of Zanaflex today. Denies concerning sx or excessive sedation.  Overweight Patient reports that she continues to gain weight. She is working on eating healthy. She is about to undergo radiation for her breast cancer -- does not know the duration of this yet. She is limited to exercise due to her breathing. She overall would like to lose weight.   Past Medical History:  Diagnosis Date   Anxiety    Arthritis    Asthma    Chronic back pain    Colon polyps    COPD (chronic obstructive pulmonary disease) (HCC)    Depression    Emphysema of lung (HCC)    GERD (gastroesophageal reflux disease)    Memory change    Sleep apnea    TIA (transient ischemic attack)    Tremor      Social History   Tobacco Use   Smoking status: Former    Packs/day: 0.50    Types: Cigarettes    Quit date: 07/17/2020    Years since quitting: 0.9   Smokeless tobacco: Never   Tobacco comments:    Not smoking cigarettes but is vaping  04/22/21  Vaping Use   Vaping Use: Every day   Substances: Nicotine,  Flavoring  Substance Use Topics   Alcohol use: Not Currently   Drug use: Not Currently    Types: Other-see comments    Comment: CBD and medical marijuana- denies 8/28/2,04/22/21    Past Surgical History:  Procedure Laterality Date   ABDOMINAL HYSTERECTOMY     BREAST LUMPECTOMY WITH RADIOFREQUENCY TAG IDENTIFICATION Left 9/37/1696   Procedure: BREAST LUMPECTOMY WITH RADIOFREQUENCY TAG IDENTIFICATION;  Surgeon: Virl Cagey, MD;  Location: [facility];  Service: General;  Laterality: Left;   CERVICAL SPINE SURGERY     INNER EAR SURGERY     LUMBAR FUSION  2012   L3-L7   PARTIAL MASTECTOMY WITH AXILLARY SENTINEL LYMPH NODE BIOPSY Left 05/31/2021   Procedure: PARTIAL MASTECTOMY WITH AXILLARY SENTINEL LYMPH NODE BIOPSY;  Surgeon: Virl Cagey, MD;  Location: [facility];  Service: General;  Laterality: Left;    Family History  Problem Relation Age of Onset   Depression Mother    Diabetes Mother    Hypertension Mother    Hyperlipidemia Mother    Heart attack Mother    Osteoarthritis Father    Asthma Father    COPD Father    Breast cancer Sister    Lupus Sister  Osteoarthritis Sister    Asthma Sister    COPD Sister    Diabetes Sister    Drug abuse Sister    Heart attack Sister    Heart attack Maternal Grandmother    Colon cancer Neg Hx    Esophageal cancer Neg Hx     Allergies  Allergen Reactions   Mucinex [Guaifenesin Er] Other (See Comments)    Jerky movements    Current Medications:   Current Outpatient Medications:    albuterol (PROVENTIL) (2.5 MG/3ML) 0.083% nebulizer solution, Take 3 mLs (2.5 mg total) by nebulization every 6 (six) hours as needed for wheezing or shortness of breath., Disp: 150 mL, Rfl: 1   albuterol (VENTOLIN HFA) 108 (90 Base) MCG/ACT inhaler, INHALE 1 TO 2 PUFFS INTO THE LUNGS EVERY 6 HOURS AS NEEDED FOR WHEEZING OR SHORTNESS OF BREATH, Disp: 18 g, Rfl: 5   atorvastatin (LIPITOR) 20 MG tablet, Take 1 tablet (20  mg total) by mouth daily., Disp: 90 tablet, Rfl: 3   budesonide-formoterol (SYMBICORT) 160-4.5 MCG/ACT inhaler, Inhale 2 puffs into the lungs 2 (two) times daily., Disp: 10.2 g, Rfl: 11   celecoxib (CELEBREX) 100 MG capsule, Take 1 capsule (100 mg total) by mouth 2 (two) times daily., Disp: 180 capsule, Rfl: 1   cyclobenzaprine (FLEXERIL) 10 MG tablet, Take 1 tablet (10 mg total) by mouth 3 (three) times daily., Disp: 90 tablet, Rfl: 1   escitalopram (LEXAPRO) 20 MG tablet, Take 1 tablet (20 mg total) by mouth daily., Disp: 90 tablet, Rfl: 1   furosemide (LASIX) 20 MG tablet, Take 20 mg by mouth daily as needed for fluid., Disp: , Rfl:    gabapentin (NEURONTIN) 300 MG capsule, Take 600-1,200 mg by mouth See admin instructions. 600 mg 3 times daily, 1200 mg at bedtime, Disp: , Rfl:    lidocaine (LIDODERM) 5 %, Place 3 patches onto the skin daily as needed (pain)., Disp: , Rfl:    montelukast (SINGULAIR) 10 MG tablet, Take 1 tablet (10 mg total) by mouth at bedtime., Disp: 90 tablet, Rfl: 1   omeprazole (PRILOSEC) 40 MG capsule, Take 1 capsule (40 mg total) by mouth 2 (two) times daily., Disp: 90 capsule, Rfl: 1   ondansetron (ZOFRAN) 4 MG tablet, Take 1 tablet (4 mg total) by mouth every 8 (eight) hours as needed., Disp: 30 tablet, Rfl: 1   oxyCODONE-acetaminophen (PERCOCET) 10-325 MG tablet, Take 1 tablet by mouth 6 (six) times daily., Disp: , Rfl:    predniSONE (DELTASONE) 20 MG tablet, Take 2 tablets (40 mg total) by mouth daily., Disp: 10 tablet, Rfl: 0   Tiotropium Bromide Monohydrate (SPIRIVA RESPIMAT) 2.5 MCG/ACT AERS, Inhale 2 puffs into the lungs daily., Disp: 4 g, Rfl: 3   traZODone (DESYREL) 100 MG tablet, Take 2 tablets (200 mg total) by mouth at bedtime., Disp: 180 tablet, Rfl: 1   tiZANidine (ZANAFLEX) 4 MG tablet, Take 1 tablet (4 mg total) by mouth 3 (three) times daily., Disp: 270 tablet, Rfl: 1   Review of Systems:   Negative unless otherwise specified per  HPI. Vitals:   Vitals:   06/25/21 1040  BP: 130/74  Pulse: 83  Temp: (!) 97.3 F (36.3 C)  TempSrc: Temporal  SpO2: 93%  Weight: 153 lb 4 oz (69.5 kg)  Height: 5' (1.524 m)     Body mass index is 29.93 kg/m.  Physical Exam:   Physical Exam Vitals and nursing note reviewed.  Constitutional:      General: She is not  in acute distress.    Appearance: She is well-developed. She is not ill-appearing or toxic-appearing.  Cardiovascular:     Rate and Rhythm: Normal rate and regular rhythm.     Pulses: Normal pulses.     Heart sounds: Normal heart sounds, S1 normal and S2 normal.  Pulmonary:     Effort: Pulmonary effort is normal.     Breath sounds: Normal breath sounds. No wheezing, rhonchi or rales.  Skin:    General: Skin is warm and dry.  Neurological:     Mental Status: She is alert.     GCS: GCS eye subscore is 4. GCS verbal subscore is 5. GCS motor subscore is 6.  Psychiatric:        Speech: Speech normal.        Behavior: Behavior normal. Behavior is cooperative.    Assessment and Plan:   Chronic obstructive pulmonary disease, unspecified COPD type (HCC) Stable; Improved  Continue Symbicort 2 4.5 mcg puffs in the AM and PM, Singulair 10 mg daily, Spiriva 2.5 mcg 2 puffs in AM, and Albuterol 108 mcg base inhaler as needed Follow-up in 3 months, sooner if concerns  Chronic bilateral low back pain without sciatica Well-controlled with medication regimen Continue zanaflex 4 mg and Flexeril 10 mg TID  Refill Zanaflex 4 mg x 3 months If any concerns for sedation or medication side effect, advised to let us know Follow up in 6 months, sooner if concerns  Overweight Discussed with her that I do not think starting any major changes to her lifestyle at this time, other than continued healthy eating, is advisable until after radiation therapy is completed Follow up with me 1 month after radiation to discuss weight loss if desired  I,Havlyn C Ratchford,acting as a  scribe for [employer], PA.,have documented all relevant documentation on the behalf of Inda Coke, PA,as directed by  Inda Coke, PA while in the presence of Inda Coke, [place].  I, Inda Coke, [place], have reviewed all documentation for this visit. The documentation on 06/25/21 for the exam, diagnosis, procedures, and orders are all accurate and complete.   Inda Coke, PA-C

## 2021-06-25 NOTE — Patient Instructions (Signed)
It was great to see you!  Keep up the good work  Follow-up with me about a month after radiation to discuss weight loss if desired  Zanaflex refill sent  Take care,  Inda Coke PA-C

## 2021-07-01 ENCOUNTER — Encounter (HOSPITAL_COMMUNITY): Payer: Self-pay | Admitting: Hematology

## 2021-07-01 ENCOUNTER — Inpatient Hospital Stay (HOSPITAL_COMMUNITY): Payer: Medicare Other | Attending: Hematology | Admitting: Hematology

## 2021-07-01 ENCOUNTER — Other Ambulatory Visit: Payer: Self-pay

## 2021-07-01 ENCOUNTER — Inpatient Hospital Stay (HOSPITAL_COMMUNITY): Payer: Medicare Other

## 2021-07-01 VITALS — BP 121/75 | HR 94 | Resp 18 | Ht 60.0 in | Wt 157.0 lb

## 2021-07-01 DIAGNOSIS — Z9079 Acquired absence of other genital organ(s): Secondary | ICD-10-CM | POA: Diagnosis not present

## 2021-07-01 DIAGNOSIS — M810 Age-related osteoporosis without current pathological fracture: Secondary | ICD-10-CM | POA: Diagnosis not present

## 2021-07-01 DIAGNOSIS — C50912 Malignant neoplasm of unspecified site of left female breast: Secondary | ICD-10-CM | POA: Insufficient documentation

## 2021-07-01 DIAGNOSIS — Z90710 Acquired absence of both cervix and uterus: Secondary | ICD-10-CM | POA: Diagnosis not present

## 2021-07-01 DIAGNOSIS — C50412 Malignant neoplasm of upper-outer quadrant of left female breast: Secondary | ICD-10-CM | POA: Diagnosis not present

## 2021-07-01 DIAGNOSIS — F17210 Nicotine dependence, cigarettes, uncomplicated: Secondary | ICD-10-CM | POA: Insufficient documentation

## 2021-07-01 DIAGNOSIS — Z90722 Acquired absence of ovaries, bilateral: Secondary | ICD-10-CM | POA: Insufficient documentation

## 2021-07-01 DIAGNOSIS — Z170 Estrogen receptor positive status [ER+]: Secondary | ICD-10-CM

## 2021-07-01 LAB — GENETIC SCREENING ORDER

## 2021-07-01 LAB — VITAMIN D 25 HYDROXY (VIT D DEFICIENCY, FRACTURES): Vit D, 25-Hydroxy: 35.07 ng/mL (ref 30–100)

## 2021-07-01 NOTE — Patient Instructions (Addendum)
[place] at [facility] Discharge Instructions  You were seen and examined today by Dr. Delton Coombes. Dr. Delton Coombes is a medical oncologist, meaning that he specializes in the treatment of cancer diagnoses. Dr. Delton Coombes discussed your past medical history, family history of cancers, and the events that led to you being here today.  You have been diagnosed with Stage I Breast Cancer known as Invasive Ductal Carcinoma. This is a common type of breast cancer. It is Estrogen Receptor Positive weakly, meaning that it is being fed by the estrogen that your body naturally produces.  Dr. Delton Coombes has recommended additional testing on your breast tissue that was removed. The testing is known as Oncotype DX, it tests to see if you would benefit from chemotherapy.   Dr. Delton Coombes also recommends a bone density test and a Vitamin D level. It is also recommended that you have genetic testing done because breast cancers can have genetic causes.  Follow-up as scheduled.   Thank you for choosing [place] at [facility] to provide your oncology and hematology care.  To afford each patient quality time with our provider, please arrive at least 15 minutes before your scheduled appointment time.   If you have a lab appointment with the [place] please come in thru the Main Entrance and check in at the main information desk.  You need to re-schedule your appointment should you arrive 10 or more minutes late.  We strive to give you quality time with our providers, and arriving late affects you and other patients whose appointments are after yours.  Also, if you no show three or more times for appointments you may be dismissed from the clinic at the providers discretion.     Again, thank you for choosing [facility].  Our hope is that these requests will decrease the amount of time that you wait before being seen by our physicians.        _____________________________________________________________  Should you have questions after your visit to [facility], please contact our office at [phone] and follow the prompts.  Our office hours are 8:00 a.m. and 4:30 p.m. Monday - Friday.  Please note that voicemails left after 4:00 p.m. may not be returned until the following business day.  We are closed weekends and major holidays.  You do have access to a nurse 24-7, just call the main number to the clinic [phone] and do not press any options, hold on the line and a nurse will answer the phone.    For prescription refill requests, have your pharmacy contact our office and allow 72 hours.    Due to Covid, you will need to wear a mask upon entering the hospital. If you do not have a mask, a mask will be given to you at the Main Entrance upon arrival. For doctor visits, patients may have 1 support person age 59 or older with them. For treatment visits, patients can not have anyone with them due to social distancing guidelines and our immunocompromised population.

## 2021-07-01 NOTE — Progress Notes (Signed)
[facility] [address], [place] [address]   Patient Care Team: Inda Coke, [place] as PCP - General (Physician Assistant) Derek Jack, MD as Medical Oncologist (Medical Oncology)  CHIEF COMPLAINTS/PURPOSE OF CONSULTATION:  Newly diagnosed left breast cancer  HISTORY OF PRESENTING ILLNESS:  Holly Phillips 66 y.o. female is here because of recent diagnosis of left breast cancer.  Today she reports feeling well. She denies prior history of biopsies. She denies hot flashes and history of hot flashes. She underwent hysterectomy due to endometriosis at age 58, which was followed by bilateral salpingo-oophorectomy 2 years later. She started estrogen pills following her bilateral salpingo-oophorectomy at age 38 which were stopped in her 43s. She denies history of CVA and MI. 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 She denies family history of cancer. She currently smokes 1 ppd and has smoked since she was 66 years old.   In terms of breast cancer risk profile:  She menarched at early age of 76 and had a total hysterectomy at age 64 due to endometriosis.  She had 1 pregnancy, her first child was born at age 72  She was exposed to fertility medications or hormone replacement therapy.  She has no family history of Breast/GYN/GI cancer  I reviewed her records extensively and collaborated the history with the patient.  SUMMARY OF ONCOLOGIC HISTORY: Oncology History   No history exists.    MEDICAL HISTORY:  Past Medical History:  Diagnosis Date   Anxiety    Arthritis    Asthma    Chronic back pain    Colon polyps    COPD (chronic obstructive pulmonary disease) (HCC)    Depression    Emphysema of lung (HCC)    GERD (gastroesophageal reflux disease)    Memory change    Sleep apnea    TIA (transient ischemic attack)    Tremor     SURGICAL HISTORY: Past Surgical History:  Procedure Laterality Date   ABDOMINAL HYSTERECTOMY     BREAST  LUMPECTOMY WITH RADIOFREQUENCY TAG IDENTIFICATION Left 9/45/8592   Procedure: BREAST LUMPECTOMY WITH RADIOFREQUENCY TAG IDENTIFICATION;  Surgeon: Virl Cagey, MD;  Location: [facility];  Service: General;  Laterality: Left;   CERVICAL SPINE SURGERY     INNER EAR SURGERY     LUMBAR FUSION  2012   L3-L7   PARTIAL MASTECTOMY WITH AXILLARY SENTINEL LYMPH NODE BIOPSY Left 05/31/2021   Procedure: PARTIAL MASTECTOMY WITH AXILLARY SENTINEL LYMPH NODE BIOPSY;  Surgeon: Virl Cagey, MD;  Location: [facility];  Service: General;  Laterality: Left;    SOCIAL HISTORY: Social History   Socioeconomic History   Marital status: Widowed    Spouse name: Not on file   Number of children: 1   Years of education: 63   Highest education level: 11th grade  Occupational History   Not on file  Tobacco Use   Smoking status: Every Day    Packs/day: 0.50    Types: Cigarettes    Last attempt to quit: 07/17/2020    Years since quitting: 0.9   Smokeless tobacco: Never   Tobacco comments:    Not smoking cigarettes but is vaping  04/22/21  Vaping Use   Vaping Use: Every day   Substances: Nicotine, Flavoring  Substance and Sexual Activity   Alcohol use: Not Currently   Drug use: Not Currently    Types: Other-see comments    Comment: CBD and medical marijuana- denies 8/28/2,04/22/21   Sexual activity: Not  Currently  Other Topics Concern   Not on file  Social History Narrative   04/22/21 Lives alone   From [place], moved to [place] in August 09, 2019   Husband passed away in 2016/07/11   Social Determinants of Health   Financial Resource Strain: Not on file  Food Insecurity: Not on file  Transportation Needs: Not on file  Physical Activity: Not on file  Stress: Not on file  Social Connections: Not on file  Intimate Partner Violence: Not on file    FAMILY HISTORY: Family History  Problem Relation Age of Onset   Depression Mother    Diabetes Mother    Hypertension Mother    Hyperlipidemia Mother     Heart attack Mother    Osteoarthritis Father    Asthma Father    COPD Father    Breast cancer Sister    Lupus Sister    Osteoarthritis Sister    Asthma Sister    COPD Sister    Diabetes Sister    Drug abuse Sister    Heart attack Sister    Heart attack Maternal Grandmother    Colon cancer Neg Hx    Esophageal cancer Neg Hx     ALLERGIES:  is allergic to mucinex [guaifenesin er].  MEDICATIONS:  Current Outpatient Medications  Medication Sig Dispense Refill   albuterol (PROVENTIL) (2.5 MG/3ML) 0.083% nebulizer solution Take 3 mLs (2.5 mg total) by nebulization every 6 (six) hours as needed for wheezing or shortness of breath. 150 mL 1   albuterol (VENTOLIN HFA) 108 (90 Base) MCG/ACT inhaler INHALE 1 TO 2 PUFFS INTO THE LUNGS EVERY 6 HOURS AS NEEDED FOR WHEEZING OR SHORTNESS OF BREATH 18 g 5   atorvastatin (LIPITOR) 20 MG tablet Take 1 tablet (20 mg total) by mouth daily. 90 tablet 3   budesonide-formoterol (SYMBICORT) 160-4.5 MCG/ACT inhaler Inhale 2 puffs into the lungs 2 (two) times daily. 10.2 g 11   celecoxib (CELEBREX) 100 MG capsule Take 1 capsule (100 mg total) by mouth 2 (two) times daily. 180 capsule 1   cyclobenzaprine (FLEXERIL) 10 MG tablet Take 1 tablet (10 mg total) by mouth 3 (three) times daily. 90 tablet 1   escitalopram (LEXAPRO) 20 MG tablet Take 1 tablet (20 mg total) by mouth daily. 90 tablet 1   furosemide (LASIX) 20 MG tablet Take 20 mg by mouth daily as needed for fluid.     gabapentin (NEURONTIN) 300 MG capsule Take 600-1,200 mg by mouth See admin instructions. 600 mg 3 times daily, 1200 mg at bedtime     lidocaine (LIDODERM) 5 % Place 3 patches onto the skin daily as needed (pain).     montelukast (SINGULAIR) 10 MG tablet Take 1 tablet (10 mg total) by mouth at bedtime. 90 tablet 1   omeprazole (PRILOSEC) 40 MG capsule Take 1 capsule (40 mg total) by mouth 2 (two) times daily. 90 capsule 1   oxyCODONE-acetaminophen (PERCOCET) 10-325 MG tablet Take 1 tablet by  mouth 6 (six) times daily.     predniSONE (DELTASONE) 20 MG tablet Take 2 tablets (40 mg total) by mouth daily. 10 tablet 0   Tiotropium Bromide Monohydrate (SPIRIVA RESPIMAT) 2.5 MCG/ACT AERS Inhale 2 puffs into the lungs daily. 4 g 3   tiZANidine (ZANAFLEX) 4 MG tablet Take 1 tablet (4 mg total) by mouth 3 (three) times daily. 270 tablet 1   traZODone (DESYREL) 100 MG tablet Take 2 tablets (200 mg total) by mouth at bedtime. 180 tablet 1  ondansetron (ZOFRAN) 4 MG tablet Take 1 tablet (4 mg total) by mouth every 8 (eight) hours as needed. (Patient not taking: Reported on 07/01/2021) 30 tablet 1   No current facility-administered medications for this visit.    REVIEW OF SYSTEMS:   Review of Systems  Constitutional:  Positive for fatigue. Negative for appetite change.  Respiratory:  Positive for cough and shortness of breath.   Endocrine: Negative for hot flashes.  Musculoskeletal:  Positive for back pain.  Neurological:  Positive for headaches.  All other systems reviewed and are negative.  PHYSICAL EXAMINATION: ECOG PERFORMANCE STATUS: 1 - Symptomatic but completely ambulatory  Vitals:   07/01/21 1335  BP: 121/75  Pulse: 94  Resp: 18  SpO2: (!) 89%   Filed Weights   07/01/21 1335  Weight: 157 lb (71.2 kg)   Physical Exam Vitals reviewed.  Constitutional:      Appearance: Normal appearance.  Cardiovascular:     Rate and Rhythm: Normal rate and regular rhythm.     Pulses: Normal pulses.     Heart sounds: Normal heart sounds.  Pulmonary:     Effort: Pulmonary effort is normal.     Breath sounds: Normal breath sounds.  Chest:  Breasts:    Right: Normal. No swelling, bleeding, inverted nipple, mass, nipple discharge, skin change or tenderness.     Left: Tenderness present. No swelling, bleeding, inverted nipple, mass, nipple discharge or skin change (lumpectomy scar around lareral areola well healed).  Neurological:     General: No focal deficit present.     Mental  Status: She is alert and oriented to person, place, and time.  Psychiatric:        Mood and Affect: Mood normal.        Behavior: Behavior normal.    Breast Exam Chaperone: Thana Ates    LABORATORY DATA:  I have reviewed the data as listed Recent Results (from the past 2160 hour(s))  CBC with Differential/Platelet     Status: None   Collection Time: 05/27/21 11:03 AM  Result Value Ref Range   WBC 10.2 4.0 - 10.5 K/uL   RBC 4.09 3.87 - 5.11 Mil/uL   Hemoglobin 12.5 12.0 - 15.0 g/dL   HCT 38.3 36.0 - 46.0 %   MCV 93.8 78.0 - 100.0 fl   MCHC 32.5 30.0 - 36.0 g/dL   RDW 15.5 11.5 - 15.5 %   Platelets 382.0 150.0 - 400.0 K/uL   Neutrophils Relative % 60.6 43.0 - 77.0 %   Lymphocytes Relative 29.3 12.0 - 46.0 %   Monocytes Relative 8.7 3.0 - 12.0 %   Eosinophils Relative 1.0 0.0 - 5.0 %   Basophils Relative 0.4 0.0 - 3.0 %   Neutro Abs 6.2 1.4 - 7.7 K/uL   Lymphs Abs 3.0 0.7 - 4.0 K/uL   Monocytes Absolute 0.9 0.1 - 1.0 K/uL   Eosinophils Absolute 0.1 0.0 - 0.7 K/uL   Basophils Absolute 0.0 0.0 - 0.1 K/uL  IBC + Ferritin     Status: Abnormal   Collection Time: 05/27/21 11:03 AM  Result Value Ref Range   Iron 45 42 - 145 ug/dL   Transferrin 256.0 212.0 - 360.0 mg/dL   Saturation Ratios 12.6 (L) 20.0 - 50.0 %   Ferritin 20.7 10.0 - 291.0 ng/mL   TIBC 358.4 250.0 - 450.0 mcg/dL  Basic metabolic panel     Status: Abnormal   Collection Time: 05/29/21 11:48 AM  Result Value Ref Range   Sodium 140  135 - 145 mmol/L   Potassium 4.4 3.5 - 5.1 mmol/L   Chloride 102 98 - 111 mmol/L   CO2 32 22 - 32 mmol/L   Glucose, Bld 133 (H) 70 - 99 mg/dL    Comment: Glucose reference range applies only to samples taken after fasting for at least 8 hours.   BUN 10 8 - 23 mg/dL   Creatinine, Ser 0.68 0.44 - 1.00 mg/dL   Calcium 9.5 8.9 - 10.3 mg/dL   GFR, Estimated >60 >60 mL/min    Comment: (NOTE) Calculated using the CKD-EPI Creatinine Equation (2021)    Anion gap 6 5 - 15    Comment:  Performed at [facility], [name], [place], [place] [address]  Glucose, capillary     Status: None   Collection Time: 05/31/21  9:28 AM  Result Value Ref Range   Glucose-Capillary 96 70 - 99 mg/dL    Comment: Glucose reference range applies only to samples taken after fasting for at least 8 hours.  Surgical pathology     Status: None   Collection Time: 05/31/21 11:44 AM  Result Value Ref Range   SURGICAL PATHOLOGY      SURGICAL PATHOLOGY CASE: [id number] PATIENT: Patric Dyment Surgical Pathology Report     Clinical History: left breast cancer     FINAL MICROSCOPIC DIAGNOSIS:  A. LYMPH NODE, SENTINEL, LEFT, BIOPSY: -  Lymph node, negative for malignancy (0/1).  B. LYMPH NODE, SENTINEL #2, LEFT, BIOPSY: -  Lymph node, negative for malignancy (0/1).  C. LYMPH NODE, SENTINEL #3, LEFT, BIOPSY: -  Lymph node, negative for malignancy (0/1).  D. LYMPH NODE, SENTINEL #4, LEFT, BIOPSY: -  Lymph node, negative for malignancy (0/1).  E. LYMPH NODE, SENTINEL #5, LEFT, BIOPSY: -  Lymph node, negative for malignancy (0/1).  F. BREAST, LEFT, MASS, LUMPECTOMY: -  Invasive carcinoma of no special type (ductal), grade 3, 8 mm in greatest dimension, all margins negative for invasive carcinoma.  G. BREAST, LEFT, SUPERIOR MARGIN, EXCISION: -  Breast tissue, negative for malignancy.  H. BREAST, LEFT, MEDIAL MARGIN, EXCISION: -  Ductal carcinoma in situ (DCIS), nuclear grade II I (high), with central necrosis and calcifications, 8 mm in greatest dimension. -  Margin negative for DCIS. -  Distance from DCIS to closest margin: 0.3 mm.  INVASIVE CARCINOMA OF THE BREAST:  Resection  Procedure:  Excision (less than total mastectomy) Specimen Laterality: Left Histologic Type: Invasive carcinoma of no special type (ductal) Histologic Grade:      Glandular (Acinar)/Tubular Differentiation: Score 3      Nuclear Pleomorphism: Score 3      Mitotic Rate: Score 2      Overall  Grade: Grade 3 Tumor Size: 8 mm in greatest dimension. Ductal Carcinoma In Situ: Present Tumor Extent: Estimated size at least 8 mm in greatest dimension Treatment Effect in the Breast: No known presurgical therapy Margins: All margins negative for invasive carcinoma      Distance from Closest Margin (mm): 8 mm      Specify Closest Margin (required only if <13m): Superior margin DCIS Margins: Uninvolved by DCIS      Distance from Closest Margin (mm): 0.3 mm      Specify Close st Margin (required only if <162m: Medial margin Regional Lymph Nodes: Regional lymph nodes present.      Number of Lymph Nodes Examined: 5      Number of Sentinel Nodes Examined: 5      Number of Lymph Nodes  with Macrometastases (>2 mm): 0      Number of Lymph Nodes with Micrometastases: 0      Number of Lymph Nodes with Isolated Tumor Cells (=0.2 mm or =200 cells): 0 Distant Metastasis:      Distant Site(s) Involved: Not applicable Breast Biomarker Testing Performed on Previous Biopsy:      Testing Performed on Case Number: [id number]            Estrogen Receptor: 5%, Positive, weak intensity staining            Progesterone Receptor: 0%, Negative            HER2 (IHC): Equivocal, 2+            HER2 (FISH):  Group 5, HER2 Negative            Ki-67: 30%  Pathologic Stage Classification (pTNM, AJCC 8th Edition): pT1b, pN0(sn) Representative Tumor Block: F3 Comment(s):  1. The prior core biopsy ([id number]) has been reviewed.  (v4.5.0.0)   GROSS DESCRIP TION:  A-E.  Received in a single container are 5 lymph node candidates encased in a minimal amount of adipose ranging from 0.5 to 3.0 cm in greatest dimensions.  The largest 3 lymph node candidates are serially sectioned revealing tan to pink cut surfaces.  The lymph node candidates are entirely submitted as follows: A1: Smallest lymph node candidate, whole B1: Second smallest lymph node candidate, whole C1: Third smallest lymph node  candidate, quadrisected D1: Second largest lymph node candidate, serially sectioned E1-E2: Largest lymph node candidate  F.  Received in formalin is a portion of left breast tissue with 2 silk sutures at the superior/anterior edge where "tag was seen" as per requisition.  The specimen is previously inked as follows: Superior = red, inferior = green, anterior = blue, posterior = black, medial = yellow, lateral = orange.  The radiograph tag is identified emerging to the external surface between the superior and anterior margins.  The spec imen measures 4.1 x 4.0 x 2.2 cm (S-I x M-L x A-P).  The specimen is sectioned from superior to inferior revealing a 0.8 x 0.8 x 0.6 cm white-tan, firm, and poorly defined stellate lesion at the site of the tag.  The lesion is 0.8 cm from the superior margin, 1.4 cm from the anterior margin, 1.4 cm from the inferior margin, 1.5 cm from the lateral margin, 1.7 cm from the medial margin, and 1.7 cm from the posterior margin.  The remaining cut surface is well lobulated with less than 1% fibrous tissue.  The lesion is blocked out and sections are submitted as follows: F1-F3: Lesion F4: Superior margin, closest to lesion F5: Inferior margin, closest to lesion F6: Anterior margin, closest to lesion F7: Posterior margin, closest lesion F8: Medial and lateral margins, closest to lesion  G.  Received in formalin is a 2.5 x 2.1 cm fragment of fibroadipose tissue excised to a depth of 0.9 cm.  The new margin is inked yellow and the opposite side is inked orange.  The specimen i s serially sectioned and entirely submitted in 3 blocks.  H.  Received in formalin is a 2.5 x 2.1 cm fragment of fibroadipose tissue excised to a depth of 0.7 cm.  The new margin is inked yellow and the opposite side is inked orange.  The specimen is serially sectioned and entirely submitted in 3 blocks. (KW, 05/31/2021)  Final Diagnosis performed by Maryan Puls, MD.    Electronically signed 06/03/2021 Technical component  performed at [facility], [place] [address], [place], [place] [address].  Professional component performed at [employer]. [facility], [place] [address] [place], [place] [address].  Immunohistochemistry Technical component (if applicable) was performed at [facility]. [address] [place], [place], [place] [address].   IMMUNOHISTOCHEMISTRY DISCLAIMER (if applicable): Some of these immunohistochemical stains may have been developed and the performance characteristics determine by [facility]. So me may not have been cleared or approved by the U.S. Food and Drug Administration. The FDA has determined that such clearance or approval is not necessary. This test is used for clinical purposes. It should not be regarded as investigational or for research. This laboratory is certified under the [place] (CLIA-88) as qualified to perform high complexity clinical laboratory testing.  The controls stained appropriately.   Glucose, capillary     Status: Abnormal   Collection Time: 05/31/21  1:22 PM  Result Value Ref Range   Glucose-Capillary 123 (H) 70 - 99 mg/dL    Comment: Glucose reference range applies only to samples taken after fasting for at least 8 hours.    RADIOGRAPHIC STUDIES: I have personally reviewed the radiological reports and agreed with the findings in the report. No results found.   ASSESSMENT:  Stage I (T1BN0) left breast IDC, ER weakly positive: - She has been on estrogen supplements started at age 42 after TAH and BSO for endometriosis.  She continued estrogen into her 73s. - Left breast biopsy on 04/29/2021, IDC, Ki-67 30%, grade 3, ER-5% positive, PR negative, HER2 2+ by IHC and negative by FISH - Left lumpectomy and SLNB on 05/31/2021, 0/5 lymph nodes involved, 8 mm invasive ductal carcinoma, grade 3, margins negative.  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   Social/family history: - She is a retired Regulatory affairs officer. - Current active smoker, 1 pack/day for the last 43 years. - No family history of malignancies.   PLAN:  Stage I (T1b N0 M0) left breast cancer: - We talked about pathology report in detail. - Recommend Oncotype DX. - We will also order bone density test and vitamin D levels. - We will recommend genetic testing. - RTC 2 to 3 weeks for follow-up.    Derek Jack, MD 07/01/21 2:02 PM  [place] [phone]   I, Thana Ates, am acting as a scribe for Dr. Derek Jack.  I, Derek Jack MD, have reviewed the above documentation for accuracy and completeness, and I agree with the above.

## 2021-07-08 ENCOUNTER — Other Ambulatory Visit: Payer: Self-pay

## 2021-07-08 ENCOUNTER — Ambulatory Visit (HOSPITAL_COMMUNITY)
Admission: RE | Admit: 2021-07-08 | Discharge: 2021-07-08 | Disposition: A | Discharge status: Home / Self Care | Payer: Medicare Other | Source: Ambulatory Visit | Attending: Hematology | Admitting: Hematology

## 2021-07-08 DIAGNOSIS — M069 Rheumatoid arthritis, unspecified: Secondary | ICD-10-CM | POA: Diagnosis not present

## 2021-07-08 DIAGNOSIS — C50412 Malignant neoplasm of upper-outer quadrant of left female breast: Secondary | ICD-10-CM | POA: Insufficient documentation

## 2021-07-08 DIAGNOSIS — Z13820 Encounter for screening for osteoporosis: Secondary | ICD-10-CM | POA: Diagnosis not present

## 2021-07-08 DIAGNOSIS — Z170 Estrogen receptor positive status [ER+]: Secondary | ICD-10-CM | POA: Diagnosis present

## 2021-07-08 DIAGNOSIS — Z780 Asymptomatic menopausal state: Secondary | ICD-10-CM | POA: Insufficient documentation

## 2021-07-08 DIAGNOSIS — M810 Age-related osteoporosis without current pathological fracture: Secondary | ICD-10-CM | POA: Insufficient documentation

## 2021-07-11 ENCOUNTER — Telehealth: Payer: Self-pay | Admitting: Physician Assistant

## 2021-07-11 NOTE — Telephone Encounter (Signed)
Pt states she went to the eye dr and she has to have surgery for her cataracts. She was referred to an office that is 1 1/2hrs away. She is wanting to know if Morene Rankins would have any recommendations for an office around the area here.

## 2021-07-12 ENCOUNTER — Other Ambulatory Visit: Payer: Self-pay

## 2021-07-12 DIAGNOSIS — H259 Unspecified age-related cataract: Secondary | ICD-10-CM

## 2021-07-12 NOTE — Telephone Encounter (Signed)
Please advise 

## 2021-07-12 NOTE — Telephone Encounter (Signed)
Called pt placing order as recommended, also c/o being constipated and asking for refill on meds to help with bowel movement please advise

## 2021-07-12 NOTE — Progress Notes (Signed)
New referral placed for pt per [facility] recommendations

## 2021-07-15 ENCOUNTER — Encounter: Payer: Self-pay | Admitting: Physician Assistant

## 2021-07-15 NOTE — Telephone Encounter (Signed)
Left message on voicemail to call office.  

## 2021-07-15 NOTE — Telephone Encounter (Signed)
Pt called back told her Aldona Bar said: -Take 100 mg of docusate sodium (also known as Colace, however generic is fine!) by mouth twice daily to soften stools  -Drink at least 64 oz of water daily  -Add in 1 capful of polyethylene glycol (also known as Miralax, however generic is fine!) to beverage of choice daily   Goal is to have a formed, soft bowel movement regularly   -After a few days if no success, may increase to a total of 2 capfuls of polyethylene glycol/ Miralax daily   If still no results, please call the office. Pt verbalized understanding.

## 2021-07-25 ENCOUNTER — Inpatient Hospital Stay (HOSPITAL_COMMUNITY): Payer: Medicare Other | Attending: Hematology | Admitting: Licensed Clinical Social Worker

## 2021-07-25 ENCOUNTER — Other Ambulatory Visit: Payer: Self-pay

## 2021-07-25 ENCOUNTER — Encounter (HOSPITAL_COMMUNITY): Payer: Self-pay | Admitting: Licensed Clinical Social Worker

## 2021-07-25 ENCOUNTER — Inpatient Hospital Stay (HOSPITAL_BASED_OUTPATIENT_CLINIC_OR_DEPARTMENT_OTHER): Payer: Medicare Other | Admitting: Hematology

## 2021-07-25 ENCOUNTER — Inpatient Hospital Stay (HOSPITAL_COMMUNITY): Payer: Medicare Other

## 2021-07-25 VITALS — BP 121/65 | HR 92 | Temp 98.1°F | Resp 18 | Wt 155.4 lb

## 2021-07-25 DIAGNOSIS — Z170 Estrogen receptor positive status [ER+]: Secondary | ICD-10-CM

## 2021-07-25 DIAGNOSIS — C50412 Malignant neoplasm of upper-outer quadrant of left female breast: Secondary | ICD-10-CM | POA: Diagnosis present

## 2021-07-25 DIAGNOSIS — Z803 Family history of malignant neoplasm of breast: Secondary | ICD-10-CM

## 2021-07-25 DIAGNOSIS — M810 Age-related osteoporosis without current pathological fracture: Secondary | ICD-10-CM

## 2021-07-25 DIAGNOSIS — Z9221 Personal history of antineoplastic chemotherapy: Secondary | ICD-10-CM | POA: Diagnosis not present

## 2021-07-25 DIAGNOSIS — Z7183 Encounter for nonprocreative genetic counseling: Secondary | ICD-10-CM

## 2021-07-25 DIAGNOSIS — Z79899 Other long term (current) drug therapy: Secondary | ICD-10-CM | POA: Diagnosis not present

## 2021-07-25 LAB — CBC WITH DIFFERENTIAL/PLATELET
Abs Immature Granulocytes: 0.03 10*3/uL (ref 0.00–0.07)
Basophils Absolute: 0.1 10*3/uL (ref 0.0–0.1)
Basophils Relative: 1 %
Eosinophils Absolute: 0.1 10*3/uL (ref 0.0–0.5)
Eosinophils Relative: 1 %
HCT: 40.6 % (ref 36.0–46.0)
Hemoglobin: 12.6 g/dL (ref 12.0–15.0)
Immature Granulocytes: 0 %
Lymphocytes Relative: 34 %
Lymphs Abs: 3.6 10*3/uL (ref 0.7–4.0)
MCH: 31.9 pg (ref 26.0–34.0)
MCHC: 31 g/dL (ref 30.0–36.0)
MCV: 102.8 fL — ABNORMAL HIGH (ref 80.0–100.0)
Monocytes Absolute: 0.9 10*3/uL (ref 0.1–1.0)
Monocytes Relative: 8 %
Neutro Abs: 5.7 10*3/uL (ref 1.7–7.7)
Neutrophils Relative %: 56 %
Platelets: 325 10*3/uL (ref 150–400)
RBC: 3.95 MIL/uL (ref 3.87–5.11)
RDW: 13.1 % (ref 11.5–15.5)
WBC: 10.4 10*3/uL (ref 4.0–10.5)
nRBC: 0 % (ref 0.0–0.2)

## 2021-07-25 LAB — COMPREHENSIVE METABOLIC PANEL
ALT: 11 U/L (ref 0–44)
AST: 17 U/L (ref 15–41)
Albumin: 3.9 g/dL (ref 3.5–5.0)
Alkaline Phosphatase: 86 U/L (ref 38–126)
Anion gap: 6 (ref 5–15)
BUN: 9 mg/dL (ref 8–23)
CO2: 34 mmol/L — ABNORMAL HIGH (ref 22–32)
Calcium: 9.2 mg/dL (ref 8.9–10.3)
Chloride: 100 mmol/L (ref 98–111)
Creatinine, Ser: 0.64 mg/dL (ref 0.44–1.00)
GFR, Estimated: >60 mL/min (ref >60)
Glucose, Bld: 85 mg/dL (ref 70–99)
Potassium: 4.1 mmol/L (ref 3.5–5.1)
Sodium: 140 mmol/L (ref 135–145)
Total Bilirubin: 0.4 mg/dL (ref 0.3–1.2)
Total Protein: 6.9 g/dL (ref 6.5–8.1)

## 2021-07-25 NOTE — Patient Instructions (Signed)
[place] will see the doctor regularly throughout treatment.  We will obtain blood work from you prior to every treatment and monitor your results to make sure it is safe to give your treatment. The doctor monitors your response to treatment by the way you are feeling, your blood work, and by obtaining scans periodically.  There will be wait times while you are here for treatment.  It will take about 30 minutes to 1 hour for your lab work to result.  Then there will be wait times while pharmacy mixes your medications.     Cyclophosphamide   About This Drug   Cyclophosphamide is used to treat cancer. It is given orally (by mouth) and in the vein (IV).   Possible Side Effects   Decrease in the number of white blood cells. This may raise your risk of infection.    Fever and neutropenic fever. A type of fever that can develop when you have a very low number of white blood cells which can be life-threatening.    Nausea and vomiting (throwing up)    Diarrhea (loose bowel movements)    Hair loss. Hair loss is often temporary, although with certain medicine, hair loss can sometimes be permanent. Hair loss may happen suddenly or gradually. If you lose hair, you may lose it from your head, face, armpits, pubic area, chest, and/or legs. You may also notice your hair getting thin.   Note: Not all possible side effects are included above.   Warnings and Precautions    Severe bone marrow suppression, which can be life-threatening. This is a decrease in the number of white blood cells, red blood cells, and platelets. This may raise your risk of infection, make you tired and weak, and raise your risk of bleeding.    Abnormal heartbeat and/or heart changes such as inflammation (swelling) in the tissue of the heart and/or congestive heart failure - your heart is not pumping blood as well as it should be, and fluid can build up in your body.    Effects on the  bladder and kidneys that may be life-threatening. This drug may cause inflammation (swelling), irritation and bleeding in the bladder and/or kidneys. You may have blood in your urine.  Changes in your liver function and blockage of small veins in the liver, which can cause liver failure and be life-threatening.    Inflammation (swelling) and/or thickening of the lungs, and changes to the small vessels of your lungs. You may have a dry cough or trouble breathing.    This drug may cause slow wound healing.    A severe decreased level of sodium in your blood, which can be life-threatening.    This drug may raise your risk of getting a second cancer.     These side effects may be more severe if you are receiving high doses of this medication included in pre-transplant chemotherapy.   Note: Some of the side effects above are very rare. If you have concerns and/or questions, please discuss them with your medical team.   Important Information    Your doctor may recommend that you drink extra fluids during or after your treatment to flush your bladder and urinate often to help decrease the risk of the effects on your bladder.    Cyclophosphamide may cause slow wound healing. If you must have emergency surgery or have an accident that results in a wound, tell the doctor that you are on cyclophosphamide.  This drug may impair your ability to drive or use machinery. Use caution and talk your doctor and/or nurse about any precautions you may need to take.    This drug may be present in the saliva, tears, sweat, urine, stool, vomit, semen, and vaginal secretions. Talk to your doctor and/or your nurse about the necessary precautions to take during this time.   Treating Side Effects    Manage tiredness by pacing your activities for the day.    Be sure to include periods of rest between energy-draining activities.    To decrease the risk of infection, wash your hands regularly.    Avoid close  contact with people who have a cold, the flu, or other infections.    Take your temperature as your doctor or nurse tells you, and whenever you feel like you may have a fever.    To help decrease the risk of bleeding, use a soft toothbrush. Check with your nurse before using dental floss.  Be very careful when using knives or tools.    Use an electric shaver instead of a razor.    Drink plenty of fluids (a minimum of eight glasses per day is recommended).    If you throw up or have diarrhea, you should drink more fluids so that you do not become dehydrated (lack of water in the body from losing too much fluid).    If you have diarrhea, eat low-fiber foods that are high in protein and calories and avoid foods that can irritate your digestive tracts or lead to cramping.    Ask your nurse or doctor about medicine that can lessen or stop your diarrhea.    To help with nausea and vomiting, eat small, frequent meals instead of three large meals a day. Choose foods and drinks that are at room temperature. Ask your nurse or doctor about other helpful tips and medicine that is available to help stop or lessen these symptoms.    To help with hair loss, wash with a mild shampoo and avoid washing your hair every day. Avoid coloring your hair.    Avoid rubbing your scalp, pat your hair or scalp dry. Limit your use of hair spray, electric curlers, blow dryers, and curling irons.    If you are interested in getting a wig, talk to your nurse and they can help you get in touch with programs in your local area.   Food and Drug Interactions    There are no known interactions of cyclophosphamide with food.    Check with your doctor or pharmacist about all other prescription medicines and over-the-counter medicines and dietary supplements (vitamins, minerals, herbs, and others) you are taking before starting this medicine as there are known drug interactions with cyclophosphamide. Also, check with your doctor  or pharmacist before starting any new prescription or over-the-counter medicines, or dietary supplement to make sure that there are no interactions.    There are known interactions of cyclophosphamide with blood thinning medicine such as warfarin. Ask your doctor what precautions you should take.    When given IV, this drug may contain alcohol and may affect your central nervous system, which is made up of your brain and spinal cord. You may feel dizzy and very sleepy.   When to Call the Doctor   Call your doctor or nurse if you have any of these symptoms and/or any new or unusual symptoms:    Fever of 100.4 F (38 C) or higher    Chills  Tiredness that interferes with your daily activities    Feeling dizzy or lightheaded    Easy bleeding or bruising    Dry cough    Wheezing and/or trouble breathing   Swelling of the hands, feet, or any other part of the body    Weight gain of 5 pounds in one week (fluid retention)  Feeling that your heart is beating in a fast or not normal way (palpitations)    Pain in your chest    Nausea that stops you from eating or drinking and/or is not relieved by prescribed medicines    Throwing up more than 3 times a day    Diarrhea, 4 times in one day or diarrhea with lack of strength or a feeling of being dizzy    Decreased urine, very dark urine, or difficulty urinating    Pain when passing urine or blood in urine    Signs of possible liver problems: dark urine, pale bowel movements, pain in your abdomen, feeling very tired and weak, unusual itching, or yellowing of the eyes or skin    Signs of possible severe low sodium levels: confusion, agitation, feeling that your heart is beating fast, passing out, seizure and/or coma    If you think you may be pregnant or may have impregnated your partner   Reproduction Warnings    Pregnancy warning: This drug can have harmful effects on the unborn baby. Women of childbearing potential should use  highly effective methods of birth control during your cancer treatment and for up to 1 year after stopping treatment. Men with female partners who are pregnant or may become pregnant should use a condom during your cancer treatment and for 4 months after stopping treatment. Let your doctor know right away if you think you may be pregnant or may have impregnated your partner.    In women, menstrual bleeding may become irregular or stop while you are getting this drug. Do not assume that you cannot become pregnant if you do not have a menstrual period.    Breastfeeding warning: Women should not breastfeed during treatment and for 1 week after stopping treatment because this drug could enter the breast milk and cause harm to a breastfeeding baby.    Fertility warning: In men and women both, this drug may affect your ability to have children in the future. Talk with your doctor or nurse if you plan to have children. Ask for information on sperm or egg banking.   Docetaxel (Taxotere)   About This Drug   Docetaxel is used to treat cancer. It is given in the vein (IV). It will take 1 hour to infuse.  The first infusion will take longer (about 1.5 hours) due to the fact that we start this drug at a very slow rate and gradually increase the rate of infusion until the maximum rate is reached.  This is done to monitor for infusion reactions, and to make sure you will tolerated this drug without adverse effects.  If you tolerate the first infusion, all subsequent infusions will be started at the normal rate and given over 1 hour.  Possible Side Effects    Bone marrow suppression. This is a decrease in the number of white blood cells, red blood cells, and platelets. This may raise your risk of infection, make you tired and weak, and raise your risk of bleeding.    Neutropenic fever. A type of fever that can develop when you have a very low number of white  blood cells which can be life-threatening.    Soreness  of the mouth and throat. You may have red areas, white patches, or sores that hurt.    Nausea and vomiting (throwing up)    Constipation (not able to move bowels)    Diarrhea (loose bowel movements)    Infections    Fluid retention - swelling of the hands, feet, or any other part of the body. Fluid may build-up around your lungs and/or heart.    Changes in the way food and drinks taste    Effects on the nerves are called peripheral neuropathy. You may feel numbness, tingling, or pain in your hands and feet. It may be hard for you to button your clothes, open jars, or walk as usual. The effect on the nerves may get worse with more doses of the drug. These effects get better in some people after the drug is stopped but it does not get better in all people.    Decreased appetite (decreased hunger)    Weakness    Pain    Muscle pain/aching    Trouble breathing    Changes in your nail color, you may have nail loss and/or brittle nail    Hair loss. Hair loss is often temporary, although there have been cases of permanent hair loss reported. Hair loss may happen suddenly or gradually. If you lose hair, you may lose it from your head, face, armpits, pubic area, chest, and/or legs. You may also notice your hair getting thin.    Allergic skin reaction. You may develop blisters on your skin that are filled with fluid or a severe red rash all over your body that may be painful.    Allergic reactions, including anaphylaxis are rare but may happen in some patients. Signs of allergic reaction to this drug may be swelling of the face, feeling like your tongue or throat are swelling, trouble breathing, rash, itching, fever, chills, feeling dizzy, and/or feeling that your heart is beating in a fast or not normal way. If this happens, do not take another dose of this drug. You should get urgent medical treatment.   Note: Not all possible side effects are included above.   Warnings and Precautions     Severe bone marrow suppression, including febrile neutropenia which can be life-threatening    Severe allergic reactions, including anaphylaxis which can be life-threatening  Colitis, which is swelling (inflammation) in the colon - symptoms are diarrhea (loose bowel movements), stomach cramping, and sometimes blood in the bowel movements  Severe skin reactions, including redness, swelling, or peeling of skin  Severe swelling in the eye or other changes in eyesight    Severe fluid retention    If you have a history of abnormal liver function, receive high doses of docetaxel, or have a history of lung cancer and have received treatment with a platinum (type of chemotherapy medication), you have an increased risk of death.    Severe weakness    This drug may raise your risk of getting a second cancer such as leukemia, lymphoma, myelodysplastic syndrome, and kidney cancer.    Severe peripheral neuropathy    This drug contains alcohol and may affect your central nervous system. The central nervous system is made up of your brain and spinal cord. You may feel dizzy and very sleepy.    Tumor lysis syndrome: This drug may act on the cancer cells very quickly. This may affect how your kidneys work. Note: Some of the side  effects above are very rare. If you have concerns and/or questions, please discuss them with your medical team. Important Information    This drug may be present in the saliva, tears, sweat, urine, stool, vomit, semen, and vaginal secretions. Talk to your doctor and/or your nurse about the necessary precautions to take during this time.    This drug may impair your ability to drive or use machinery. Use caution and tell your nurse or doctor if you feel dizzy, very sleepy, and/or experience low blood pressure. Treating Side Effects    Manage tiredness by pacing your activities for the day.    Be sure to include periods of rest between energy-draining activities.    Get regular  exercise. If you feel too tired to exercise vigorously, try taking a short walk.     To decrease the risk of infection, wash your hands regularly.    Avoid close contact with people who have a cold, the flu, or other infections.    Take your temperature as your doctor or nurse tells you, and whenever you feel like you may have a fever.    To help decrease the risk of bleeding, use a soft toothbrush. Check with your nurse before using dental floss.    Be very careful when using knives or tools.    Use an electric shaver instead of a razor.    Mouth care is very important and will help food taste better and improve your appetite. Your mouth care should consist of routine, gentle cleaning of your teeth or dentures and rinsing your mouth with a mixture of 1/2 teaspoon of salt in 8 ounces of water or 1/2 teaspoon of baking soda in 8 ounces of water. This should be done at least after each meal and at bedtime.    If you have mouth sores, avoid mouthwash that has alcohol. Also avoid alcohol and smoking because they can bother your mouth and throat.    Taking good care of your mouth may help food taste better and improve your appetite    Drink plenty of fluids (a minimum of eight glasses per day is recommended).    If you throw up or have diarrhea, you should drink more fluids so that you do not become dehydrated (lack of water in the body from losing too much fluid).    If you have diarrhea, eat low-fiber foods that are high in protein and calories and avoid foods that can irritate your digestive tracts or lead to cramping.    If you are not able to move your bowels, check with your doctor or nurse before you use enemas, laxatives, or suppositories.    Ask your doctor or nurse about medicines that are available to help stop or lessen constipation and/ or diarrhea.    To help with nausea and vomiting, eat small, frequent meals instead of three large meals a day. Choose foods and drinks that are  at room temperature. Ask your nurse or doctor about other helpful tips and medicine that is available to help stop or lessen these symptoms.    To help with decreased appetite, eat foods high in calories and protein, such as meat, poultry, fish, dry beans, tofu, eggs, nuts, milk, yogurt, cheese, ice cream, pudding, and nutritional supplements.    Consider using sauces and spices to increase taste. Daily exercise, with your doctors approval, may increase your appetite.    Keeping your pain under control is important to your well-being. Please tell your doctor  or nurse if you are experiencing pain.    If you get a rash do not put anything on it unless your doctor or nurse says you may. Keep the area around the rash clean and dry. Ask your doctor for medicine if your rash bothers you.    Keeping your nails moisturized may help with brittleness.    To help with hair loss, wash with a mild shampoo and avoid washing your hair every day. Avoid coloring your hair.    Avoid rubbing your scalp, pat your hair or scalp dry.    Limit your use of hair spray, electric curlers, blow dryers, and curling irons.    If you are interested in getting a wig, talk to your nurse and they can help you get in touch with programs in your local area.    If you have numbness and tingling in your hands and feet, be careful when cooking, walking, and handling sharp objects and hot liquids.   Food and Drug Interactions    This drug may interact with grapefruit and grapefruit juice. Talk to your doctor as this could make side effects worse.    This drug may interact with other medicines. Tell your doctor and pharmacist about all the prescription and over-the-counter medicines and dietary supplements (vitamins, minerals, herbs, and others) that you are taking at this time. Also, check with your doctor or pharmacist before starting any new prescription or over-the-counter medicines, or dietary supplements to make sure that  there are no interactions.    This drug may interact with [place] and may lower the levels of the drug in your body, which can make it less effective. When to Call the Doctor Call your doctor or nurse if you have any of these symptoms and/or any new or unusual symptoms:    Fever of 100.4 F (38 C) or higher    Chills    Blurred vision or other changes in eyesight, excessive tearing    Symptoms of being drunk, confusion, or being very sleepy    Feeling dizzy or lightheaded  Tiredness and/or weakness that interferes with your daily activities  Easy bruising or bleeding    Wheezing and/or trouble breathing    Chest pain    Pain in your mouth or throat that makes it hard to eat or drink    Nausea that stops you from eating or drinking and/or is not relieved by prescribed medicines    Throwing up more than 3 times a day    Lasting loss of appetite or rapid weight loss of five pounds in a week    Diarrhea, 4 times in one day or diarrhea with lack of strength or a feeling of being dizzy    No bowel movement in 3 days or when you feel uncomfortable    Pain in your abdomen that does not go away    Blood in your stool    Swelling of the hands, feet, or any other part of the body    Weight gain of 5 pounds in one week (fluid retention)    New rash and/or itching  Rash that is not relieved by prescribed medicines    Signs of inflammation/infection (redness, swelling, pain) of the tissue around your nails.    Signs of allergic reaction: swelling of the face, feeling like your tongue or throat are swelling, trouble breathing, rash, itching, fever, chills, feeling dizzy, and/or feeling that your heart is beating in a fast or not normal  way. If this happens, call 911 for emergency care.    Flu-like symptoms: fever, headache, muscle and joint aches, and fatigue (low energy, feeling weak)    Signs of possible liver problems: dark urine, pale bowel movements, pain in your  abdomen, feeling very tired and weak, unusual itching, or yellowing of the eyes or skin    Numbness, tingling, or pain in your hands and feet    Signs of tumor lysis: confusion or agitation, decreased urine, nausea/vomiting, diarrhea, muscle cramping, numbness and/or tingling, seizures.    General pain that does not go away or is not relieved by prescribed medicine    If you think you may be pregnant or have impregnated your partner   Reproduction Warnings    Pregnancy warning: This drug can have harmful effects on the unborn baby. Women of childbearing potential should use effective methods of birth control during your cancer treatment and for 6 months after stopping treatment. Men with female partners of childbearing potential should use effective methods of birth control during your cancer treatment and for 3 months after stopping treatment. Let your doctor know right away if you think you may be pregnant or may have impregnated your partner.    Breastfeeding warning: Women should not breastfeed during treatment and for 1 week after stopping treatment because this drug could enter the breast milk and cause harm to a breastfeeding baby.    Fertility warning: In men, this drug may affect your ability to have children in the future. Talk with your doctor or nurse if you plan to have children. Ask for information on sperm banking.   SELF CARE ACTIVITIES WHILE ON CHEMOTHERAPY/IMMUNOTHERAPY:  Hydration Increase your fluid intake 48 hours prior to treatment and drink at least 8 to 12 cups (64 ounces) of water/decaffeinated beverages per day after treatment. You can still have your cup of coffee or soda but these beverages do not count as part of your 8 to 12 cups that you need to drink daily. No alcohol intake.  Medications Continue taking your normal prescription medication as prescribed.  If you start any new herbal or new supplements please let us know first to make sure it is safe.  Mouth  Care Have teeth cleaned professionally before starting treatment. Keep dentures and partial plates clean. Use soft toothbrush and do not use mouthwashes that contain alcohol. Biotene is a good mouthwash that is available at most pharmacies or may be ordered by calling [phone]. Use warm salt water gargles (1 teaspoon salt per 1 quart warm water) before and after meals and at bedtime. Or you may rinse with 2 tablespoons of three-percent hydrogen peroxide mixed in eight ounces of water. If you are still having problems with your mouth or sores in your mouth please call the clinic. If you need dental work, please let the doctor know before you go for your appointment so that we can coordinate the best possible time for you in regards to your chemo regimen. You need to also let your dentist know that you are actively taking chemo. We may need to do labs prior to your dental appointment.  Skin Care Always use sunscreen that has not expired and with SPF (Sun Protection Factor) of 50 or higher. Wear hats to protect your head from the sun. Remember to use sunscreen on your hands, ears, face, & feet.  Use good moisturizing lotions such as udder cream, eucerin, or even Vaseline. Some chemotherapies can cause dry skin, color changes in your  skin and nails.    Avoid long, hot showers or baths. Use gentle, fragrance-free soaps and laundry detergent. Use moisturizers, preferably creams or ointments rather than lotions because the thicker consistency is better at preventing skin dehydration. Apply the cream or ointment within 15 minutes of showering. Reapply moisturizer at night, and moisturize your hands every time after you wash them.   Infection Prevention Please wash your hands for at least 30 seconds using warm soapy water. Handwashing is the #1 way to prevent the spread of germs. Stay away from sick people or people who are getting over a cold. If you develop respiratory systems such as green/yellow mucus  production or productive cough or persistent cough let us know and we will see if you need an antibiotic. It is a good idea to keep a pair of gloves on when going into grocery stores/[employer] to decrease your risk of coming into contact with germs on the carts, etc. Carry alcohol hand gel with you at all times and use it frequently if out in public. If your temperature reaches 100.5 or higher please call the clinic and let us know.  If it is after hours or on the weekend please go to the ER if your temperature is over 100.4.  Please have your own personal thermometer at home to use.    Sex and bodily fluids If you are going to have sex, a condom must be used to protect the person that isn't taking immunotherapy. For a few days after treatment, immunotherapy can be excreted through your bodily fluids.  When using the toilet please close the lid and flush the toilet twice.  Do this for a few day after you have had immunotherapy.   Contraception It is not known for sure whether or not immunotherapy drugs can be passed on through semen or secretions from the vagina. Because of this some doctors advise people to use a barrier method if you have sex during treatment. This applies to vaginal, anal or oral sex.  Generally, doctors advise a barrier method only for the time you are actually having the treatment and for about a week after your treatment.  Advice like this can be worrying, but this does not mean that you have to avoid being intimate with your partner. You can still have close contact with your partner and continue to enjoy sex.  Animals If you have cats or birds we just ask that you not change the litter or change the cage.  Please have someone else do this for you while you are on immunotherapy.   Food Safety During and After Cancer Treatment Food safety is important for people both during and after cancer treatment. Cancer and cancer treatments, such as chemotherapy, radiation therapy, and stem  cell/bone marrow transplantation, often weaken the immune system. This makes it harder for your body to protect itself from foodborne illness, also called food poisoning. Foodborne illness is caused by eating food that contains harmful bacteria, parasites, or viruses.  Foods to avoid Some foods have a higher risk of becoming tainted with bacteria. These include: Unwashed fresh fruit and vegetables, especially leafy vegetables that can hide dirt and other contaminants Raw sprouts, such as alfalfa sprouts Raw or undercooked beef, especially ground beef, or other raw or undercooked meat and poultry Fatty, fried, or spicy foods immediately before or after treatment.  These can sit heavy on your stomach and make you feel nauseous. Raw or undercooked shellfish, such as oysters. Sushi and sashimi, which  often contain raw fish.  Unpasteurized beverages, such as unpasteurized fruit juices, raw milk, raw yogurt, or cider Undercooked eggs, such as soft boiled, over easy, and poached; raw, unpasteurized eggs; or foods made with raw egg, such as homemade raw cookie dough and homemade mayonnaise  Simple steps for food safety  Shop smart. Do not buy food stored or displayed in an unclean area. Do not buy bruised or damaged fruits or vegetables. Do not buy cans that have cracks, dents, or bulges. Pick up foods that can spoil at the end of your shopping trip and store them in a cooler on the way home.  Prepare and clean up foods carefully. Rinse all fresh fruits and vegetables under running water, and dry them with a clean towel or paper towel. Clean the top of cans before opening them. After preparing food, wash your hands for 20 seconds with hot water and soap. Pay special attention to areas between fingers and under nails. Clean your utensils and dishes with hot water and soap. Disinfect your kitchen and cutting boards using 1 teaspoon of liquid, unscented bleach mixed into 1 quart of water.    Dispose  of old food. Eat canned and packaged food before its expiration date (the use by or best before date). Consume refrigerated leftovers within 3 to 4 days. After that time, throw out the food. Even if the food does not smell or look spoiled, it still may be unsafe. Some bacteria, such as Listeria, can grow even on foods stored in the refrigerator if they are kept for too long.  Take precautions when eating out. At restaurants, avoid buffets and salad bars where food sits out for a long time and comes in contact with many people. Food can become contaminated when someone with a virus, often a norovirus, or another bug handles it. Put any leftover food in a to-go container yourself, rather than having the server do it. And, refrigerate leftovers as soon as you get home. Choose restaurants that are clean and that are willing to prepare your food as you order it cooked.    SYMPTOMS TO REPORT AS SOON AS POSSIBLE AFTER TREATMENT:  FEVER GREATER THAN 100.4 F CHILLS WITH OR WITHOUT FEVER NAUSEA AND VOMITING THAT IS NOT CONTROLLED WITH YOUR NAUSEA MEDICATION UNUSUAL SHORTNESS OF BREATH UNUSUAL BRUISING OR BLEEDING TENDERNESS IN MOUTH AND THROAT WITH OR WITHOUT PRESENCE OF ULCERS URINARY PROBLEMS BOWEL PROBLEMS UNUSUAL RASH     Wear comfortable clothing and clothing appropriate for easy access to any Portacath or PICC line. Let us know if there is anything that we can do to make your therapy better!   What to do if you need assistance after hours or on the weekends: CALL [phone].  HOLD on the line, do not hang up.  You will hear multiple messages but at the end you will be connected with a nurse triage line.  They will contact the doctor if necessary.  Most of the time they will be able to assist you.  Do not call the hospital operator.    I have been informed and understand all of the instructions given to me and have received a copy. I have been instructed to call the clinic [phone] or my family physician as soon as possible for continued medical care, if indicated. I do not have any more questions at this time but understand that I may call the [facility] or the Patient Navigator at [phone] during office hours should  I have questions or need assistance in obtaining follow-up care.

## 2021-07-25 NOTE — Patient Instructions (Signed)
[facility] ?Discharge Instructions ? ? ?You were seen and examined today by Dr. Delton Coombes. ? ?He discussed the results of the Oncotype Dx test.  It suggest you would have a strong benefit of receiving chemotherapy.  Dr. Raliegh Ip recommends a chemotherapy regimen with two drugs - Taxotere and Cytoxan. You will receive both of these drugs in the clinic once every 3 weeks for a total of 4 cycles.  After chemotherapy, we will put you on an estrogen blocker pill and refer you to radiation oncology.  ? ?We will refer you back to Dr. Constance Haw so she can place a port a cath for chemotherapy administration.  ? ? ?Thank you for choosing [facility] to provide your oncology and hematology care.  To afford each patient quality time with our provider, please arrive at least 15 minutes before your scheduled appointment time.  ? ?If you have a lab appointment with the [facility] please come in thru the Main Entrance and check in at the main information desk. ? ?You need to re-schedule your appointment should you arrive 10 or more minutes late.  We strive to give you quality time with our providers, and arriving late affects you and other patients whose appointments are after yours.  Also, if you no show three or more times for appointments you may be dismissed from the clinic at the providers discretion.     ?Again, thank you for choosing [facility].  Our hope is that these requests will decrease the amount of time that you wait before being seen by our physicians.       ?_____________________________________________________________ ? ?Should you have questions after your visit to [facility], please contact our office at [phone] and follow the prompts.  Our office hours are 8:00 a.m. and 4:30 p.m. Monday - Friday.  Please note that voicemails left after 4:00 p.m. may not be returned until the following business day.  We are  closed weekends and major holidays.  You do have access to a nurse 24-7, just call the main number to the clinic [phone] and do not press any options, hold on the line and a nurse will answer the phone.   ? ?For prescription refill requests, have your pharmacy contact our office and allow 72 hours.   ? ?Due to Covid, you will need to wear a mask upon entering the hospital. If you do not have a mask, a mask will be given to you at the Main Entrance upon arrival. For doctor visits, patients may have 1 support person age 58 or older with them. For treatment visits, patients can not have anyone with them due to social distancing guidelines and our immunocompromised population.  ? ?   ?

## 2021-07-25 NOTE — Progress Notes (Signed)
REFERRING PROVIDER: Doreatha Massed, MD [address] [place],  [place] [address]  PRIMARY PROVIDER:  Jarold Motto, PA  PRIMARY REASON FOR VISIT:  1. Malignant neoplasm of upper-outer quadrant of left breast in female, estrogen receptor positive (HCC)   2. Family history of breast cancer    I connected with Ms. Ferritto on 07/25/2021 at 1:50 PM EDT by MyChart video conference and verified that I am speaking with the correct person using two identifiers.    Patient location: home Provider location: [facility]  HISTORY OF PRESENT ILLNESS:   Ms. Molina, a 66 y.o. female, was seen for a [place] cancer genetics consultation at the request of Dr. Ellin Saba due to a personal and family history of cancer.  Ms. Cusic presents to clinic today to discuss the possibility of a hereditary predisposition to cancer, genetic testing, and to further clarify her future cancer risks, as well as potential cancer risks for family members.   In 2022, at the age of 72, Ms. 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 The treatment plan includes lumpectomy which was completed on 05/31/2021, remainder of treatment plan still being determined.   CANCER HISTORY:  Oncology History   No history exists.     RISK FACTORS:  Menarche was at age 12.  First live birth at age 14.  Ovaries intact: no. - BSO at 21 Hysterectomy: yes.  Menopausal status: postmenopausal.  HRT use:  20+  years. Colonoscopy: yes; normal.  Past Medical History:  Diagnosis Date   Anxiety    Arthritis    Asthma    Chronic back pain    Colon polyps    COPD (chronic obstructive pulmonary disease) (HCC)    Depression    Emphysema of lung (HCC)    Family history of breast cancer    GERD (gastroesophageal reflux disease)    Memory change    Sleep apnea    TIA (transient ischemic attack)    Tremor     Past Surgical History:  Procedure  Laterality Date   ABDOMINAL HYSTERECTOMY     BREAST LUMPECTOMY WITH RADIOFREQUENCY TAG IDENTIFICATION Left 05/31/2021   Procedure: BREAST LUMPECTOMY WITH RADIOFREQUENCY TAG IDENTIFICATION;  Surgeon: Lucretia Roers, MD;  Location: [facility];  Service: General;  Laterality: Left;   CERVICAL SPINE SURGERY     INNER EAR SURGERY     LUMBAR FUSION  2012   L3-L7   PARTIAL MASTECTOMY WITH AXILLARY SENTINEL LYMPH NODE BIOPSY Left 05/31/2021   Procedure: PARTIAL MASTECTOMY WITH AXILLARY SENTINEL LYMPH NODE BIOPSY;  Surgeon: Lucretia Roers, MD;  Location: [facility];  Service: General;  Laterality: Left;    Social History   Socioeconomic History   Marital status: Widowed    Spouse name: Not on file   Number of children: 1   Years of education: 53   Highest education level: 11th grade  Occupational History   Not on file  Tobacco Use   Smoking status: Every Day    Packs/day: 0.50    Types: Cigarettes    Last attempt to quit: 07/17/2020    Years since quitting: 1.0   Smokeless tobacco: Never   Tobacco comments:    Not smoking cigarettes but is vaping  04/22/21  Vaping Use   Vaping Use: Every day   Substances: Nicotine, Flavoring  Substance and Sexual Activity   Alcohol use: Not Currently   Drug use: Not Currently    Types:  Other-see comments    Comment: CBD and medical marijuana- denies 8/28/2,04/22/21   Sexual activity: Not Currently  Other Topics Concern   Not on file  Social History Narrative   04/22/21 Lives alone   From [place], moved to [place] in Jul 05, 2019  Husband passed away in 03-Jun-2016   Social Determinants of Health   Financial Resource Strain: Not on file  Food Insecurity: Not on file  Transportation Needs: Not on file  Physical Activity: Not on file  Stress: Not on file  Social Connections: Not on file     FAMILY HISTORY:  We obtained a detailed, 4-generation family history.  Significant diagnoses are listed below: Family History  Problem Relation Age of Onset    Depression Mother    Diabetes Mother    Hypertension Mother    Hyperlipidemia Mother    Heart attack Mother    Osteoarthritis Father    Asthma Father    COPD Father    Breast cancer Sister    Lupus Sister    Osteoarthritis Sister    Asthma Sister    COPD Sister    Diabetes Sister    Drug abuse Sister    Heart attack Sister    Heart attack Maternal Grandmother    Colon cancer Neg Hx    Esophageal cancer Neg Hx    Ms. Felch has 1 daughter, 74. She had uterine cancer in her late teenage years, patient says this was not cervical cancer. Patient has 4 sisters. One sister had breast cancer in her 46s-60s and reportedly had negative genetic testing.   Ms. Lacks mother died at 88. Patient had 3 aunts and 3 uncles on this side, none had cancer she is aware of. Maternal grandparents passed over the age of 59.  Ms. Roubideaux father died in his 54s and he was an only child. Paternal grandmother passed at 6, grandfather passed at 71.   Ms. Crisafi is unaware of previous family history of genetic testing for hereditary cancer risks. Patient's maternal ancestors are of unknown descent, and paternal ancestors are of [place] descent. There is no reported Ashkenazi Jewish ancestry. There is no known consanguinity.    GENETIC COUNSELING ASSESSMENT: Ms. Hirzel is a 66 y.o. female with a personal and family history of cancer which is somewhat suggestive of a hereditary cancer syndrome and predisposition to cancer. We, therefore, discussed and recommended the following at today's visit.   DISCUSSION: We discussed that approximately 10% of breast cancer is hereditary. Most cases of hereditary breast cancer are associated with BRCA1/BRCA2 genes, although there are other genes associated with hereditary breast cancer as well. Cancers and risks are gene specific.  We discussed that testing is beneficial for several reasons including knowing about other cancer risks, identifying potential  screening and risk-reduction options that may be appropriate, and to understand if other family members could be at risk for cancer and allow them to undergo genetic testing.   We reviewed the characteristics, features and inheritance patterns of hereditary cancer syndromes. We also discussed genetic testing, including the appropriate family members to test, the process of testing, insurance coverage and turn-around-time for results. We discussed the implications of a negative, positive and/or variant of uncertain significant result. We recommended Ms. Veneziano pursue genetic testing for the [facility] Multi-Cancer+RNA gene panel.   Based on Ms. Mikels's personal and family history of cancer, she meets medical criteria for genetic testing. Despite that she meets criteria, she may still have an out of pocket cost.  We discussed that if her out of pocket cost for testing is over $100, the laboratory will call and confirm whether she wants to proceed with testing.  If the out of pocket cost of testing is less than $100 she will be billed by the genetic testing laboratory.   PLAN: After considering the risks, benefits, and limitations, Ms. Landrum provided informed consent to pursue genetic testing and the blood sample was sent to [facility] for analysis of the Multi-Cancer+RNA panel. This was sent in several week ago. Results should be available within approximately 1 weeks' time, at which point they will be disclosed by telephone to Ms. Wolgamott, as will any additional recommendations warranted by these results. Ms. Knippa will receive a summary of her genetic counseling visit and a copy of her results once available. This information will also be available in Epic.   Ms. Disandro questions were answered to her satisfaction today. Our contact information was provided should additional questions or concerns arise. Thank you for the referral and allowing [place] to share in the care of your  patient.   Lacy Duverney, MS, [facility] Genetic Counselor [email or web address] Phone: [phone]  The patient was seen for a total of 20 minutes in face-to-face genetic counseling.  Patient was seen alone. Dr. Orlie Dakin was available for discussion regarding this case.   _______________________________________________________________________ For Office Staff:  Number of people involved in session: 1 Was an Intern/ student involved with case: no

## 2021-07-25 NOTE — Progress Notes (Signed)
? ?[facility] ?[address] ?[place], [place] [id number] ? ? ?Patient Care Team: ?Inda Coke, [place] as PCP - General (Physician Assistant) ?Derek Jack, MD as Medical Oncologist (Medical Oncology) ? ?SUMMARY OF ONCOLOGIC HISTORY: ?Oncology History  ? No history exists.  ? ? ?CHIEF COMPLIANT: Follow-up of left breast cancer ? ? ?INTERVAL HISTORY: Holly Phillips is a 66 y.o. female here today for follow up of her follow-up. Her last visit was on 07/01/2021.  ? ?Today Holly Phillips reports feeling well. Holly Phillips is not currently taking calcium or vitamin D. Holly Phillips reports occasional numbness in her legs at night due to history of restless leg syndrome. Holly Phillips denies history of DM.  ? ?REVIEW OF SYSTEMS:   ?Review of Systems  ?Constitutional:  Positive for fatigue. Negative for appetite change.  ?HENT:   Positive for trouble swallowing.   ?Musculoskeletal:  Positive for back pain (7/10).  ?Neurological:  Positive for dizziness, headaches and numbness.  ?Psychiatric/Behavioral:  Positive for depression and sleep disturbance.   ?All other systems reviewed and are negative. ? ?I have reviewed the past medical history, past surgical history, social history and family history with the patient and they are unchanged from previous note. ? ? ?ALLERGIES:   ?is allergic to mucinex [guaifenesin er]. ? ? ?MEDICATIONS:  ?Current Outpatient Medications  ?Medication Sig Dispense Refill  ? albuterol (PROVENTIL) (2.5 MG/3ML) 0.083% nebulizer solution Take 3 mLs (2.5 mg total) by nebulization every 6 (six) hours as needed for wheezing or shortness of breath. 150 mL 1  ? albuterol (VENTOLIN HFA) 108 (90 Base) MCG/ACT inhaler INHALE 1 TO 2 PUFFS INTO THE LUNGS EVERY 6 HOURS AS NEEDED FOR WHEEZING OR SHORTNESS OF BREATH 18 g 5  ? atorvastatin (LIPITOR) 20 MG tablet Take 1 tablet (20 mg total) by mouth daily. 90 tablet 3  ? budesonide-formoterol (SYMBICORT) 160-4.5 MCG/ACT inhaler Inhale 2 puffs into the lungs 2 (two) times daily.  10.2 g 11  ? celecoxib (CELEBREX) 100 MG capsule Take 1 capsule (100 mg total) by mouth 2 (two) times daily. 180 capsule 1  ? cyclobenzaprine (FLEXERIL) 10 MG tablet Take 1 tablet (10 mg total) by mouth 3 (three) times daily. 90 tablet 1  ? escitalopram (LEXAPRO) 20 MG tablet Take 1 tablet (20 mg total) by mouth daily. 90 tablet 1  ? furosemide (LASIX) 20 MG tablet Take 20 mg by mouth daily as needed for fluid.    ? gabapentin (NEURONTIN) 300 MG capsule Take 600-1,200 mg by mouth See admin instructions. 600 mg 3 times daily, 1200 mg at bedtime    ? lidocaine (LIDODERM) 5 % Place 3 patches onto the skin daily as needed (pain).    ? montelukast (SINGULAIR) 10 MG tablet Take 1 tablet (10 mg total) by mouth at bedtime. 90 tablet 1  ? omeprazole (PRILOSEC) 40 MG capsule Take 1 capsule (40 mg total) by mouth 2 (two) times daily. 90 capsule 1  ? ondansetron (ZOFRAN) 4 MG tablet Take 1 tablet (4 mg total) by mouth every 8 (eight) hours as needed. 30 tablet 1  ? oxyCODONE-acetaminophen (PERCOCET) 10-325 MG tablet Take 1 tablet by mouth 6 (six) times daily.    ? predniSONE (DELTASONE) 20 MG tablet Take 2 tablets (40 mg total) by mouth daily. 10 tablet 0  ? Tiotropium Bromide Monohydrate (SPIRIVA RESPIMAT) 2.5 MCG/ACT AERS Inhale 2 puffs into the lungs daily. 4 g 3  ? tiZANidine (ZANAFLEX) 4 MG tablet Take 1 tablet (4 mg total) by mouth  3 (three) times daily. 270 tablet 1  ? traZODone (DESYREL) 100 MG tablet Take 2 tablets (200 mg total) by mouth at bedtime. 180 tablet 1  ? ?No current facility-administered medications for this visit.  ? ? ? ?PHYSICAL EXAMINATION: ?Performance status (ECOG): 1 - Symptomatic but completely ambulatory ? ?Vitals:  ? 07/25/21 1311  ?BP: 121/65  ?Pulse: 92  ?Resp: 18  ?Temp: 98.1 ?F (36.7 ?C)  ?SpO2: 98%  ? ?Wt Readings from Last 3 Encounters:  ?07/25/21 155 lb 6.4 oz (70.5 kg)  ?07/01/21 157 lb (71.2 kg)  ?06/25/21 153 lb 4 oz (69.5 kg)  ? ?Physical Exam ?Vitals reviewed.  ?Constitutional:   ?    Appearance: Normal appearance.  ?Cardiovascular:  ?   Rate and Rhythm: Normal rate and regular rhythm.  ?   Pulses: Normal pulses.  ?   Heart sounds: Normal heart sounds.  ?Pulmonary:  ?   Effort: Pulmonary effort is normal.  ?   Breath sounds: Normal breath sounds.  ?Neurological:  ?   General: No focal deficit present.  ?   Mental Status: Holly Phillips is alert and oriented to person, place, and time.  ?Psychiatric:     ?   Mood and Affect: Mood normal.     ?   Behavior: Behavior normal.  ? ? ?Breast Exam Chaperone: Thana Ates   ? ? ?LABORATORY DATA:  ?I have reviewed the data as listed ?CMP Latest Ref Rng & Units 05/29/2021 02/06/2021 07/13/2020  ?Glucose 70 - 99 mg/dL 133(H) 96 93  ?BUN 8 - 23 mg/dL _0 ?Creatinine 0.44 - 1.00 mg/dL 0.68 0.65 0.77  ?Sodium 135 - 145 mmol/L 140 141 136  ?Potassium 3.5 - 5.1 mmol/L 4.4 4.2 4.1  ?Chloride 98 - 111 mmol/L 102 102 100  ?CO2 22 - 32 mmol/L 32 31 28  ?Calcium 8.9 - 10.3 mg/dL 9.5 9.4 8.4(L)  ?Total Protein 6.0 - 8.3 g/dL - 7.0 6.6  ?Total Bilirubin 0.2 - 1.2 mg/dL - 0.5 0.3  ?Alkaline Phos 39 - 117 U/L - 108 83  ?AST 0 - 37 U/L - 18 25  ?ALT 0 - 35 U/L - 9 18  ? ?No results found for: SAY301 ?Lab Results  ?Component Value Date  ? WBC 10.2 05/27/2021  ? HGB 12.5 05/27/2021  ? HCT 38.3 05/27/2021  ? MCV 93.8 05/27/2021  ? PLT 382.0 05/27/2021  ? NEUTROABS 6.2 05/27/2021  ? ? ?ASSESSMENT:  ?Stage I (T1BN0) left breast IDC, ER weakly positive: ?- Holly Phillips has been on estrogen supplements started at age 28 after TAH and BSO for endometriosis.  Holly Phillips continued estrogen into her 61s. ?- Left breast biopsy on 04/29/2021, IDC, Ki-67 30%, grade 3, ER-5% positive, PR negative, HER2 2+ by IHC and negative by FISH ?- Left lumpectomy and SLNB on 05/31/2021, 0/5 lymph nodes involved, 8 mm invasive ductal carcinoma, grade 3, margins negative.  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 ?- Oncotype DX recurrence score 60.  Distant recurrence rate at 9 years more  than 39%.  Average absolute chemotherapy benefit more than 15%. ?- Adjuvant chemotherapy with 4 cycles of TC followed by XRT plus AI. ? ?  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 ?- Current active smoker, 1 pack/day for the last 43 years. ?- No family history of malignancies. ? ? ?PLAN:  ?Stage I (T1b N0 M0) left breast cancer: ?- We talked about Oncotype Dx results which showed recurrence score  of 60. ?- We talked about adjuvant chemotherapy with 4 cycles of TC. ?- We also talked about URCC nausea protocol.  Holly Phillips is interested. ?- We discussed chemotherapy regimen and side effects in detail. ?- Recommend port placement. ?- RTC after port placement to initiate chemotherapy. ?- Holly Phillips will be placed on anastrozole after completion of chemotherapy and referral to radiation therapy will be made. ? ?2.  Osteoporosis: ?- We discussed results of DEXA scan from 07/08/2021 with T score -2.6. ?- I have recommended her to start taking calcium and vitamin D supplements. ?- I have also recommended Prolia injection every 6 months after completion of chemotherapy. ? ?Breast Cancer therapy associated bone loss: I have recommended calcium, Vitamin D and weight bearing exercises. ? ?Orders placed this encounter:  ?Orders Placed This Encounter  ?Procedures  ? CBC with Differential  ? Comprehensive metabolic panel  ? ? ?The patient has a good understanding of the overall plan. Holly Phillips agrees with it. Holly Phillips will call with any problems that may develop before the next visit here. ? ?Derek Jack, MD ?[facility] ?[phone] ? ? ?I, Thana Ates, am acting as a scribe for Dr. Derek Jack. ? ?I, Derek Jack MD, have reviewed the above documentation for accuracy and completeness, and I agree with the above. ?  ? ? ?

## 2021-07-25 NOTE — Progress Notes (Signed)
START ON PATHWAY REGIMEN - Breast ? ? ?  A cycle is every 21 days: ?    Docetaxel  ?    Cyclophosphamide  ? ?**Always confirm dose/schedule in your pharmacy ordering system** ? ?Patient Characteristics: ?Postoperative without Neoadjuvant Therapy (Pathologic Staging), Invasive Disease, Adjuvant Therapy, HER2 Negative/Unknown/Equivocal, ER Positive, Node Negative, pT1a-c, pN0/N13m or pT2 or Higher, pN0, Oncotype High Risk (? 26) ?Therapeutic Status: Postoperative without Neoadjuvant Therapy (Pathologic Staging) ?AJCC Grade: G3 ?AJCC N Category: pN0 ?AJCC M Category: cM0 ?ER Status: Positive (+) ?AJCC 8 Stage Grouping: IA ?HER2 Status: Negative (-) ?Oncotype Dx Recurrence Score: 60 ?AJCC T Category: pT1b ?PR Status: Negative (-) ?Adjuvant Therapy Status: No Adjuvant Therapy Received Yet or Changing Initial Adjuvant Regimen due to Tolerance ?Has this patient completed genomic testing<= Yes - Oncotype DX(R) ?Intent of Therapy: ?Curative Intent, Discussed with Patient ?

## 2021-07-29 ENCOUNTER — Encounter (HOSPITAL_COMMUNITY): Payer: Self-pay

## 2021-07-29 ENCOUNTER — Other Ambulatory Visit: Payer: Self-pay

## 2021-07-29 ENCOUNTER — Encounter (HOSPITAL_COMMUNITY)
Admission: RE | Admit: 2021-07-29 | Discharge: 2021-07-29 | Disposition: A | Discharge status: Home / Self Care | Payer: Medicare Other | Source: Ambulatory Visit | Attending: General Surgery | Admitting: General Surgery

## 2021-07-30 ENCOUNTER — Telehealth: Payer: Self-pay | Admitting: Licensed Clinical Social Worker

## 2021-07-30 ENCOUNTER — Ambulatory Visit (HOSPITAL_COMMUNITY): Payer: Medicare Other

## 2021-07-30 ENCOUNTER — Encounter: Payer: Self-pay | Admitting: Licensed Clinical Social Worker

## 2021-07-30 ENCOUNTER — Ambulatory Visit: Payer: Self-pay | Admitting: Licensed Clinical Social Worker

## 2021-07-30 ENCOUNTER — Other Ambulatory Visit: Payer: Self-pay

## 2021-07-30 ENCOUNTER — Ambulatory Visit (HOSPITAL_COMMUNITY): Payer: Medicare Other | Admitting: Certified Registered"

## 2021-07-30 ENCOUNTER — Ambulatory Visit (HOSPITAL_COMMUNITY)
Admission: RE | Admit: 2021-07-30 | Discharge: 2021-07-30 | Disposition: A | Discharge status: Home / Self Care | Payer: Medicare Other | Attending: General Surgery | Admitting: General Surgery

## 2021-07-30 ENCOUNTER — Encounter (HOSPITAL_COMMUNITY)
Admission: RE | Disposition: A | Discharge status: Home / Self Care | Payer: Self-pay | Source: Home / Self Care | Attending: General Surgery

## 2021-07-30 ENCOUNTER — Ambulatory Visit (HOSPITAL_BASED_OUTPATIENT_CLINIC_OR_DEPARTMENT_OTHER): Payer: Medicare Other | Admitting: Certified Registered"

## 2021-07-30 ENCOUNTER — Encounter (HOSPITAL_COMMUNITY): Payer: Self-pay | Admitting: General Surgery

## 2021-07-30 DIAGNOSIS — Z170 Estrogen receptor positive status [ER+]: Secondary | ICD-10-CM

## 2021-07-30 DIAGNOSIS — G4730 Sleep apnea, unspecified: Secondary | ICD-10-CM | POA: Insufficient documentation

## 2021-07-30 DIAGNOSIS — Z8673 Personal history of transient ischemic attack (TIA), and cerebral infarction without residual deficits: Secondary | ICD-10-CM | POA: Diagnosis not present

## 2021-07-30 DIAGNOSIS — J439 Emphysema, unspecified: Secondary | ICD-10-CM | POA: Diagnosis not present

## 2021-07-30 DIAGNOSIS — Z1379 Encounter for other screening for genetic and chromosomal anomalies: Secondary | ICD-10-CM

## 2021-07-30 DIAGNOSIS — K219 Gastro-esophageal reflux disease without esophagitis: Secondary | ICD-10-CM | POA: Diagnosis not present

## 2021-07-30 DIAGNOSIS — C50912 Malignant neoplasm of unspecified site of left female breast: Secondary | ICD-10-CM

## 2021-07-30 DIAGNOSIS — F418 Other specified anxiety disorders: Secondary | ICD-10-CM

## 2021-07-30 DIAGNOSIS — F419 Anxiety disorder, unspecified: Secondary | ICD-10-CM | POA: Insufficient documentation

## 2021-07-30 DIAGNOSIS — Z79899 Other long term (current) drug therapy: Secondary | ICD-10-CM | POA: Insufficient documentation

## 2021-07-30 DIAGNOSIS — F17210 Nicotine dependence, cigarettes, uncomplicated: Secondary | ICD-10-CM | POA: Diagnosis not present

## 2021-07-30 DIAGNOSIS — J449 Chronic obstructive pulmonary disease, unspecified: Secondary | ICD-10-CM

## 2021-07-30 DIAGNOSIS — C50412 Malignant neoplasm of upper-outer quadrant of left female breast: Secondary | ICD-10-CM

## 2021-07-30 DIAGNOSIS — Z95828 Presence of other vascular implants and grafts: Secondary | ICD-10-CM

## 2021-07-30 DIAGNOSIS — F32A Depression, unspecified: Secondary | ICD-10-CM | POA: Insufficient documentation

## 2021-07-30 IMAGING — DX DG CHEST 1V PORT
1 series · 1 of 1 positions shown · non-contrast
Comparison: CT a chest [DATE]

CLINICAL DATA: Port-A-Cath placement.  Breast cancer.

EXAM:
PORTABLE CHEST 1 VIEW

[chest ap]
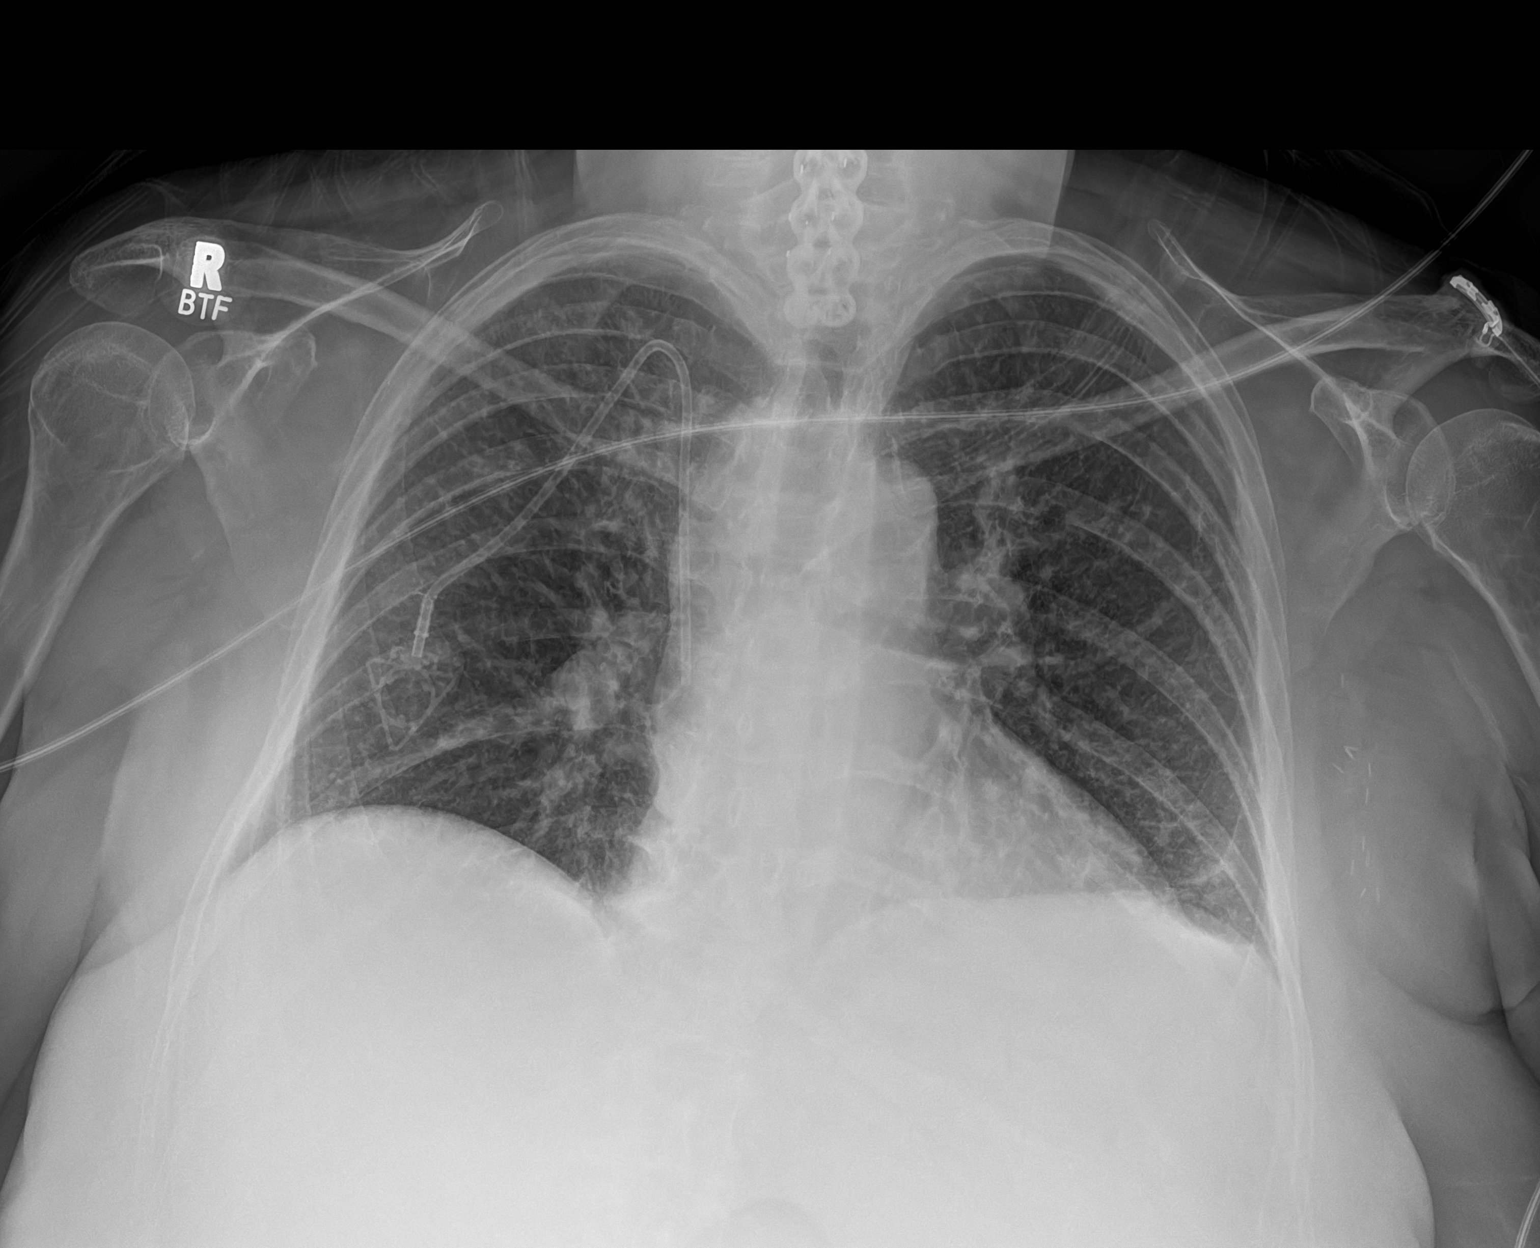

[1 of 1 positions shown; findings below may reference images not displayed]

FINDINGS: Heart size exaggerated by low lung volumes. Minimal atelectasis
present at the left base. Lungs are otherwise clear.

A right IJ Port-A-Cath is in place. The tip is at the cavoatrial
junction. No pneumothorax is present.
IMPRESSION: 1. Interval placement of right IJ Port-A-Cath without radiographic
evidence for complication.
2. Low lung volumes.

## 2021-07-30 SURGERY — INSERTION, TUNNELED CENTRAL VENOUS DEVICE, WITH PORT
Anesthesia: General | Site: Chest | Laterality: Right

## 2021-07-30 MED ORDER — ORAL CARE MOUTH RINSE: 15.0000 mL | Freq: Once | OROMUCOSAL | Status: DC

## 2021-07-30 MED ORDER — FENTANYL CITRATE (PF) 100 MCG/2ML IJ SOLN
INTRAMUSCULAR | Status: AC
  Filled 2021-07-30: qty 2

## 2021-07-30 MED ORDER — LIDOCAINE HCL (PF) 2 % IJ SOLN
INTRAMUSCULAR | Status: AC
  Filled 2021-07-30: qty 5

## 2021-07-30 MED ORDER — CHLORHEXIDINE GLUCONATE 0.12 % MT SOLN: 15.0000 mL | Freq: Once | OROMUCOSAL | Status: DC

## 2021-07-30 MED ORDER — LIDOCAINE HCL (PF) 1 % IJ SOLN
INTRAMUSCULAR | Status: DC | PRN
  Administered 2021-07-30: 17 mL

## 2021-07-30 MED ORDER — LACTATED RINGERS IV SOLN: INTRAVENOUS | Status: DC

## 2021-07-30 MED ORDER — HEPARIN SOD (PORK) LOCK FLUSH 100 UNIT/ML IV SOLN
INTRAVENOUS | Status: DC | PRN
  Administered 2021-07-30: 500 [IU] via INTRAVENOUS

## 2021-07-30 MED ORDER — PROPOFOL 10 MG/ML IV BOLUS
INTRAVENOUS | Status: DC | PRN
  Administered 2021-07-30 (×4): 10 mg via INTRAVENOUS
  Administered 2021-07-30: 20 mg via INTRAVENOUS

## 2021-07-30 MED ORDER — LACTATED RINGERS IV SOLN: INTRAVENOUS | Status: DC | PRN

## 2021-07-30 MED ORDER — MIDAZOLAM HCL 2 MG/2ML IJ SOLN
INTRAMUSCULAR | Status: AC
  Filled 2021-07-30: qty 2

## 2021-07-30 MED ORDER — PROPOFOL 10 MG/ML IV BOLUS
INTRAVENOUS | Status: AC
  Filled 2021-07-30: qty 20

## 2021-07-30 MED ORDER — ONDANSETRON HCL 4 MG/2ML IJ SOLN: 4.0000 mg | Freq: Once | INTRAMUSCULAR | Status: DC | PRN

## 2021-07-30 MED ORDER — SODIUM CHLORIDE (PF) 0.9 % IJ SOLN
INTRAMUSCULAR | Status: DC | PRN
  Administered 2021-07-30: 10 mL via INTRAVENOUS

## 2021-07-30 MED ORDER — LIDOCAINE HCL (PF) 1 % IJ SOLN
INTRAMUSCULAR | Status: AC
  Filled 2021-07-30: qty 30

## 2021-07-30 MED ORDER — FENTANYL CITRATE (PF) 100 MCG/2ML IJ SOLN
INTRAMUSCULAR | Status: DC | PRN
  Administered 2021-07-30: 50 ug via INTRAVENOUS

## 2021-07-30 MED ORDER — CEFAZOLIN SODIUM-DEXTROSE 2-4 GM/100ML-% IV SOLN
2.0000 g | INTRAVENOUS | Status: AC
  Administered 2021-07-30: 2 g via INTRAVENOUS

## 2021-07-30 MED ORDER — HEPARIN SOD (PORK) LOCK FLUSH 100 UNIT/ML IV SOLN
INTRAVENOUS | Status: AC
  Filled 2021-07-30: qty 5

## 2021-07-30 MED ORDER — CHLORHEXIDINE GLUCONATE CLOTH 2 % EX PADS: 6.0000 | MEDICATED_PAD | Freq: Once | CUTANEOUS | Status: DC

## 2021-07-30 MED ORDER — MIDAZOLAM HCL 5 MG/5ML IJ SOLN
INTRAMUSCULAR | Status: DC | PRN
Start: 2021-07-30 — End: 2021-07-30
  Administered 2021-07-30: 2 mg via INTRAVENOUS

## 2021-07-30 MED ORDER — FENTANYL CITRATE PF 50 MCG/ML IJ SOSY: 25.0000 ug | PREFILLED_SYRINGE | INTRAMUSCULAR | Status: DC | PRN

## 2021-07-30 MED ORDER — ONDANSETRON HCL 4 MG/2ML IJ SOLN
INTRAMUSCULAR | Status: AC
  Filled 2021-07-30: qty 2

## 2021-07-30 SURGICAL SUPPLY — 34 items
ADH SKN CLS APL DERMABOND .7 (GAUZE/BANDAGES/DRESSINGS) ×1
APL PRP STRL LF ISPRP CHG 10.5 (MISCELLANEOUS) ×1
APPLICATOR CHLORAPREP 10.5 ORG (MISCELLANEOUS) ×2 IMPLANT
APPLIER CLIP 9.375 SM OPEN (CLIP)
APR CLP SM 9.3 20 MLT OPN (CLIP)
BAG DECANTER FOR FLEXI CONT (MISCELLANEOUS) ×2 IMPLANT
CLIP APPLIE 9.375 SM OPEN (CLIP) IMPLANT
CLOTH BEACON ORANGE TIMEOUT ST (SAFETY) ×2 IMPLANT
COVER LIGHT HANDLE STERIS (MISCELLANEOUS) ×4 IMPLANT
DECANTER SPIKE VIAL GLASS SM (MISCELLANEOUS) ×2 IMPLANT
DERMABOND ADVANCED (GAUZE/BANDAGES/DRESSINGS) ×1
DERMABOND ADVANCED .7 DNX12 (GAUZE/BANDAGES/DRESSINGS) ×1 IMPLANT
DRAPE C-ARM FOLDED MOBILE STRL (DRAPES) ×2 IMPLANT
ELECT REM PT RETURN 9FT ADLT (ELECTROSURGICAL) ×2
ELECTRODE REM PT RTRN 9FT ADLT (ELECTROSURGICAL) ×1 IMPLANT
GLOVE SURG ENC MOIS LTX SZ6.5 (GLOVE) ×2 IMPLANT
GLOVE SURG UNDER POLY LF SZ7 (GLOVE) ×4 IMPLANT
GOWN STRL REUS W/TWL LRG LVL3 (GOWN DISPOSABLE) ×5 IMPLANT
IV NS 500ML (IV SOLUTION) ×2
IV NS 500ML BAXH (IV SOLUTION) ×1 IMPLANT
KIT PORT POWER 8FR ISP MRI (Port) ×2 IMPLANT
KIT TURNOVER KIT A (KITS) ×2 IMPLANT
MANIFOLD NEPTUNE II (INSTRUMENTS) ×2 IMPLANT
NDL HYPO 25X1 1.5 SAFETY (NEEDLE) ×1 IMPLANT
NEEDLE HYPO 25X1 1.5 SAFETY (NEEDLE) ×2 IMPLANT
PACK MINOR (CUSTOM PROCEDURE TRAY) ×2 IMPLANT
PAD ARMBOARD 7.5X6 YLW CONV (MISCELLANEOUS) ×2 IMPLANT
SET BASIN LINEN APH (SET/KITS/TRAYS/PACK) ×2 IMPLANT
SUT MNCRL AB 4-0 PS2 18 (SUTURE) ×2 IMPLANT
SUT PROLENE 2 0 SH 30 (SUTURE) ×2 IMPLANT
SUT VIC AB 3-0 SH 27 (SUTURE) ×2
SUT VIC AB 3-0 SH 27X BRD (SUTURE) ×1 IMPLANT
SYR 10ML LL (SYRINGE) ×4 IMPLANT
SYR CONTROL 10ML LL (SYRINGE) ×2 IMPLANT

## 2021-07-30 NOTE — Telephone Encounter (Signed)
Revealed negative genetic testing.   This normal result is reassuring and indicates that it is unlikely Holly Phillips's cancer is due to a hereditary cause.  It is unlikely that there is an increased risk of another cancer due to a mutation in one of these genes.  However, genetic testing is not perfect, and cannot definitively rule out a hereditary cause.  It will be important for her to keep in contact with genetics to learn if any additional testing may be needed in the future.    ?

## 2021-07-30 NOTE — Progress Notes (Signed)
[facility] ? ?Cxr with good placement and no pneumothorax. ? ?[name], MD

## 2021-07-30 NOTE — Progress Notes (Signed)
[facility] ? ?Updated daughter in law. Patient already has narcotic Rx at home. CXR ordered. Will need to verify CXR before dc home. ? ?[name], MD ?[facility] ?[place], [place] [address] ?[phone] (office) ? ?

## 2021-07-30 NOTE — Progress Notes (Signed)
HPI:  Holly Phillips was previously seen in the [place] clinic due to a personal and family history of cancer and concerns regarding a hereditary predisposition to cancer. Please refer to our prior cancer genetics clinic note for more information regarding our discussion, assessment and recommendations, at the time. Holly Phillips recent genetic test results were disclosed to her, as were recommendations warranted by these results. These results and recommendations are discussed in more detail below. ? ?CANCER HISTORY:  ?Oncology History  ?Malignant neoplasm of upper-outer quadrant of left breast in female, estrogen receptor positive ([place])  ?05/14/2021 Initial Diagnosis  ? Malignant neoplasm of upper-outer quadrant of left breast in female, estrogen receptor positive ([place]) ?  ?08/05/2021 -  Chemotherapy  ? Patient is on Treatment Plan : BREAST TC q21d  ?   ? Genetic Testing  ? Negative genetic testing. No pathogenic variants identified on the [facility] Multi-Cancer+RNA panel. The report date is 07/25/2021. ? ?The Multi-Cancer Panel + RNA offered by [facility] includes sequencing and/or deletion duplication testing of the following 84 genes: AIP, ALK, APC, ATM, AXIN2,BAP1,  BARD1, BLM, BMPR1A, BRCA1, BRCA2, BRIP1, CASR, CDC73, CDH1, CDK4, CDKN1B, CDKN1C, CDKN2A (p14ARF), CDKN2A (p16INK4a), CEBPA, CHEK2, CTNNA1, DICER1, DIS3L2, EGFR (c.2369C>T, p.Thr790Met variant only), EPCAM (Deletion/duplication testing only), FH, FLCN, GATA2, GPC3, GREM1 (Promoter region deletion/duplication testing only), HOXB13 (c.251G>A, p.Gly84Glu), HRAS, KIT, MAX, MEN1, MET, MITF (c.952G>A, p.Glu318Lys variant only), MLH1, MSH2, MSH3, MSH6, MUTYH, NBN, NF1, NF2, NTHL1, PALB2, PDGFRA, PHOX2B, PMS2, POLD1, POLE, POT1, PRKAR1A, PTCH1, PTEN, RAD50, RAD51C, RAD51D, RB1, RECQL4, RET, RUNX1, SDHAF2, SDHA (sequence changes only), SDHB, SDHC, SDHD, SMAD4, SMARCA4, SMARCB1, SMARCE1, STK11, SUFU, TERC, TERT, TMEM127, TP53, TSC1, TSC2, VHL,  WRN and WT1. ?  ? ? ?FAMILY HISTORY:  ?We obtained a detailed, 4-generation family history.  Significant diagnoses are listed below: ?Family History  ?Problem Relation Age of Onset  ? Depression Mother   ? Diabetes Mother   ? Hypertension Mother   ? Hyperlipidemia Mother   ? Heart attack Mother   ? Osteoarthritis Father   ? Asthma Father   ? COPD Father   ? Breast cancer Sister   ?     dx 4s-60s  ? Lupus Sister   ? Osteoarthritis Sister   ? Asthma Sister   ? COPD Sister   ? Diabetes Sister   ? Drug abuse Sister   ? Heart attack Sister   ? Heart attack Maternal Grandmother   ? Colon cancer Neg Hx   ? Esophageal cancer Neg Hx   ? ? ?Holly Phillips has 1 daughter, 47. She had uterine cancer in her late teenage years, patient says this was not cervical cancer. Patient has 4 sisters. One sister had breast cancer in her 39s-60s and reportedly had negative genetic testing.  ?  ?Holly Phillips's mother died at 33. Patient had 3 aunts and 3 uncles on this side, none had cancer she is aware of. Maternal grandparents passed over the age of 68. ?  ?Holly Phillips's father died in his 64s and he was an only child. Paternal grandmother passed at 53, grandfather passed at 18.  ?  ?Holly Phillips is unaware of previous family history of genetic testing for hereditary cancer risks. Patient's maternal ancestors are of unknown descent, and paternal ancestors are of [place] descent. There is no reported Ashkenazi Jewish ancestry. There is no known consanguinity. ? ?GENETIC TEST RESULTS: Genetic testing reported out on 07/25/2021 through the [facility] Multi-Cancer+RNA cancer panel found no pathogenic mutations.  ? ?  The Multi-Cancer Panel + RNA offered by [facility] includes sequencing and/or deletion duplication testing of the following 84 genes: AIP, ALK, APC, ATM, AXIN2,BAP1,  BARD1, BLM, BMPR1A, BRCA1, BRCA2, BRIP1, CASR, CDC73, CDH1, CDK4, CDKN1B, CDKN1C, CDKN2A (p14ARF), CDKN2A (p16INK4a), CEBPA, CHEK2, CTNNA1, DICER1, DIS3L2, EGFR  (c.2369C>T, p.Thr790Met variant only), EPCAM (Deletion/duplication testing only), FH, FLCN, GATA2, GPC3, GREM1 (Promoter region deletion/duplication testing only), HOXB13 (c.251G>A, p.Gly84Glu), HRAS, KIT, MAX, MEN1, MET, MITF (c.952G>A, p.Glu318Lys variant only), MLH1, MSH2, MSH3, MSH6, MUTYH, NBN, NF1, NF2, NTHL1, PALB2, PDGFRA, PHOX2B, PMS2, POLD1, POLE, POT1, PRKAR1A, PTCH1, PTEN, RAD50, RAD51C, RAD51D, RB1, RECQL4, RET, RUNX1, SDHAF2, SDHA (sequence changes only), SDHB, SDHC, SDHD, SMAD4, SMARCA4, SMARCB1, SMARCE1, STK11, SUFU, TERC, TERT, TMEM127, TP53, TSC1, TSC2, VHL, WRN and WT1.  ? ?The test report has been scanned into EPIC and is located under the Molecular Pathology section of the Results Review tab.  A portion of the result report is included below for reference.  ? ? ? ?We discussed that because current genetic testing is not perfect, it is possible there may be a gene mutation in one of these genes that current testing cannot detect, but that chance is small.  There could be another gene that has not yet been discovered, or that we have not yet tested, that is responsible for the cancer diagnoses in the family. It is also possible there is a hereditary cause for the cancer in the family that Holly Phillips did not inherit and therefore was not identified in her testing.  Therefore, it is important to remain in touch with cancer genetics in the future so that we can continue to offer Holly Phillips the most up to date genetic testing.  ? ?ADDITIONAL GENETIC TESTING: We discussed with Holly Phillips that her genetic testing was fairly extensive.  If there are genes identified to increase cancer risk that can be analyzed in the future, we would be happy to discuss and coordinate this testing at that time.   ? ?CANCER SCREENING RECOMMENDATIONS: Holly Phillips test result is considered negative (normal).  This means that we have not identified a hereditary cause for her  personal and family history of  cancer at this time. Most cancers happen by chance and this negative test suggests that her cancer may fall into this category.   ? ?While reassuring, this does not definitively rule out a hereditary predisposition to cancer. It is still possible that there could be genetic mutations that are undetectable by current technology. There could be genetic mutations in genes that have not been tested or identified to increase cancer risk.  Therefore, it is recommended she continue to follow the cancer management and screening guidelines provided by her oncology and primary healthcare provider.  ? ?An individual's cancer risk and medical management are not determined by genetic test results alone. Overall cancer risk assessment incorporates additional factors, including personal medical history, family history, and any available genetic information that may result in a personalized plan for cancer prevention and surveillance. ? ?RECOMMENDATIONS FOR FAMILY MEMBERS:  Relatives in this family might be at some increased risk of developing cancer, over the general population risk, simply due to the family history of cancer.  We recommended female relatives in this family have a yearly mammogram beginning at age 12, or 4 years younger than the earliest onset of cancer, an annual clinical breast exam, and perform monthly breast self-exams. Female relatives in this family should also have a gynecological exam as recommended by their primary provider.  All family members should be referred for colonoscopy starting at age 11.  ? ?FOLLOW-UP: Lastly, we discussed with Holly Phillips that cancer genetics is a rapidly advancing field and it is possible that new genetic tests will be appropriate for her and/or her family members in the future. We encouraged her to remain in contact with cancer genetics on an annual basis so we can update her personal and family histories and let her know of advances in cancer genetics that may benefit  this family.  ? ?Our contact number was provided. Holly Phillips questions were answered to her satisfaction, and she knows she is welcome to call us at anytime with additional questions or concerns.  ? ?Br

## 2021-07-30 NOTE — Anesthesia Postprocedure Evaluation (Signed)
Anesthesia Post Note ? ?Patient: Educational psychologist ? ?Procedure(s) Performed: INSERTION PORT-A-CATH (Right: Chest) ? ?Patient location during evaluation: Phase II ?Anesthesia Type: General ?Level of consciousness: awake ?Pain management: pain level controlled ?Vital Signs Assessment: post-procedure vital signs reviewed and stable ?Respiratory status: spontaneous breathing and respiratory function stable ?Cardiovascular status: blood pressure returned to baseline and stable ?Postop Assessment: no headache and no apparent nausea or vomiting ?Anesthetic complications: no ?Comments: Late entry ? ? ?No notable events documented. ? ? ?Last Vitals:  ?Vitals:  ? 07/30/21 1017 07/30/21 1020  ?BP:  (!) 176/68  ?Pulse: 93   ?Resp: 14   ?Temp: (!) 36.2 ?C   ?SpO2: 93%   ?  ?Last Pain:  ?Vitals:  ? 07/30/21 1017  ?TempSrc: Axillary  ?PainSc: 0-No pain  ? ? ?  ?  ?  ?  ?  ?  ? ?Louann Sjogren ? ? ? ? ?

## 2021-07-30 NOTE — Op Note (Signed)
Operative Note ?07/30/21 ?  ?Preoperative Diagnosis: Left breast cancer  ?  ?Postoperative Diagnosis: Same ?  ?Procedure(s) Performed: Port-A-Cath placement, right internal jugular  ?  ?Surgeon: Lanell Matar. Constance Haw, MD ?  ?Assistants: No qualified resident was available ?  ?Anesthesia: Monitored anesthesia care ?  ?Anesthesiologist: Louann Sjogren, MD  ?  ?Specimens: None ?  ?Estimated Blood Loss: Minimal  ? ?Fluoroscopy time: 14 seconds ?  ?Blood Replacement: None  ?  ?Complications: None  ?  ?Operative Findings: Normal anatomy ? ?Indications: Patient has left breast cancer and will need chemotherapy per Oncology.  ? ?Procedure: The patient was brought into the operating room and monitored anesthesia care was induced.  One percent lidocaine was used for local anesthesia.   The right chest and neck was prepped and draped in the usual sterile fashion.  Preoperative antibiotics were given.  An incision was made below the right clavicle. A subcutaneous pocket was formed. The needles advanced into the right subclavian vein. I could not get access into vein for the wire to advance. I then used ultrasound and accessed the right jugular vein using the Seldinger technique without difficulty. A guidewire was then advanced into the right atrium under fluoroscopic guidance.  Ectopia was not noted. An introducer and peel-away sheath were placed over the guidewire. The catheter was tunneled from the subcutaneous pocket and then inserted through the peel-away sheath and the peel-away sheath was removed.  A spot film was performed to confirm the position. The catheter was then attached to the port and the port placed in subcutaneous pocket. Adequate positioning was confirmed by fluoroscopy. Hemostasis was confirmed, and the port was secured with 2-0 prolene sutures.  Good backflow of blood was noted on aspiration of the port. The port was flushed with heparin flush. Subcutaneous layer was reapproximated using a 3-0 Vicryl  interrupted suture. The skin was closed using a 4-0 Vicryl subcuticular suture. Dermabond was applied. ?   ?All tape and needle counts were correct at the end of the procedure. The patient was transferred to PACU in stable condition. A chest x-ray will be performed at that time. ? ?[name], MD ?[facility] ?[place], [place] [address] ?[phone] (office) ? ? ?  ?

## 2021-07-30 NOTE — H&P (Signed)
[facility] History and Physical ? ? ? ?Holly Phillips is a 66 y.o. female.  ?HPI: Patient know to me with a left sided breast cancer. Oncology is recommending chemotherapy. She had a partial mastectomy and sentinel node 05/2021. She has been doing well and has no complaints.  ? ?Past Medical History:  ?Diagnosis Date  ? Anxiety   ? Arthritis   ? Asthma   ? Chronic back pain   ? Colon polyps   ? COPD (chronic obstructive pulmonary disease) ([place])   ? Depression   ? Emphysema of lung ([place])   ? Family history of breast cancer   ? GERD (gastroesophageal reflux disease)   ? Memory change   ? Sleep apnea   ? TIA (transient ischemic attack)   ? Tremor   ? ? ?Past Surgical History:  ?Procedure Laterality Date  ? ABDOMINAL HYSTERECTOMY    ? BREAST LUMPECTOMY WITH RADIOFREQUENCY TAG IDENTIFICATION Left 4/69/6295  ? Procedure: BREAST LUMPECTOMY WITH RADIOFREQUENCY TAG IDENTIFICATION;  Surgeon: Virl Cagey, MD;  Location: [facility];  Service: General;  Laterality: Left;  ? CERVICAL SPINE SURGERY    ? INNER EAR SURGERY    ? LUMBAR FUSION  2012  ? L3-L7  ? PARTIAL MASTECTOMY WITH AXILLARY SENTINEL LYMPH NODE BIOPSY Left 05/31/2021  ? Procedure: PARTIAL MASTECTOMY WITH AXILLARY SENTINEL LYMPH NODE BIOPSY;  Surgeon: Virl Cagey, MD;  Location: [facility];  Service: General;  Laterality: Left;  ? ? ?Family History  ?Problem Relation Age of Onset  ? Depression Mother   ? Diabetes Mother   ? Hypertension Mother   ? Hyperlipidemia Mother   ? Heart attack Mother   ? Osteoarthritis Father   ? Asthma Father   ? COPD Father   ? Breast cancer Sister   ?     dx 50s-60s  ? Lupus Sister   ? Osteoarthritis Sister   ? Asthma Sister   ? COPD Sister   ? Diabetes Sister   ? Drug abuse Sister   ? Heart attack Sister   ? Heart attack Maternal Grandmother   ? Colon cancer Neg Hx   ? Esophageal cancer Neg Hx   ? ? ?Social History  ? ?Tobacco Use  ? Smoking status: Every Day  ?  Packs/day: 0.50  ?  Types: Cigarettes  ?  Last  attempt to quit: 07/17/2020  ?  Years since quitting: 1.0  ? Smokeless tobacco: Never  ? Tobacco comments:  ?  Not smoking cigarettes but is vaping  04/22/21  ?Vaping Use  ? Vaping Use: Every day  ? Substances: Nicotine, Flavoring  ?Substance Use Topics  ? Alcohol use: Not Currently  ? Drug use: Not Currently  ?  Types: Other-see comments  ?  Comment: CBD and medical marijuana- denies 8/28/2,04/22/21  ? ? ?Medications: I have reviewed the patient's current medications. ?Medications Prior to Admission  ?Medication Sig Dispense Refill  ? albuterol (PROVENTIL) (2.5 MG/3ML) 0.083% nebulizer solution Take 3 mLs (2.5 mg total) by nebulization every 6 (six) hours as needed for wheezing or shortness of breath. 150 mL 1  ? albuterol (VENTOLIN HFA) 108 (90 Base) MCG/ACT inhaler INHALE 1 TO 2 PUFFS INTO THE LUNGS EVERY 6 HOURS AS NEEDED FOR WHEEZING OR SHORTNESS OF BREATH 18 g 5  ? atorvastatin (LIPITOR) 20 MG tablet Take 1 tablet (20 mg total) by mouth daily. 90 tablet 3  ? budesonide-formoterol (SYMBICORT) 160-4.5 MCG/ACT inhaler Inhale 2 puffs into the lungs 2 (two) times  daily. 10.2 g 11  ? celecoxib (CELEBREX) 100 MG capsule Take 1 capsule (100 mg total) by mouth 2 (two) times daily. 180 capsule 1  ? cyclobenzaprine (FLEXERIL) 10 MG tablet Take 1 tablet (10 mg total) by mouth 3 (three) times daily. 90 tablet 1  ? escitalopram (LEXAPRO) 20 MG tablet Take 1 tablet (20 mg total) by mouth daily. 90 tablet 1  ? furosemide (LASIX) 20 MG tablet Take 20 mg by mouth daily as needed for fluid.    ? gabapentin (NEURONTIN) 300 MG capsule Take 600-1,200 mg by mouth See admin instructions. 600 mg 3 times daily, 1200 mg at bedtime    ? lidocaine (LIDODERM) 5 % Place 3 patches onto the skin daily as needed (pain).    ? montelukast (SINGULAIR) 10 MG tablet Take 1 tablet (10 mg total) by mouth at bedtime. 90 tablet 1  ? omeprazole (PRILOSEC) 40 MG capsule Take 1 capsule (40 mg total) by mouth 2 (two) times daily. 90 capsule 1  ? ondansetron  (ZOFRAN) 4 MG tablet Take 1 tablet (4 mg total) by mouth every 8 (eight) hours as needed. 30 tablet 1  ? oxyCODONE-acetaminophen (PERCOCET) 10-325 MG tablet Take 1 tablet by mouth 6 (six) times daily.    ? predniSONE (DELTASONE) 20 MG tablet Take 2 tablets (40 mg total) by mouth daily. 10 tablet 0  ? Tiotropium Bromide Monohydrate (SPIRIVA RESPIMAT) 2.5 MCG/ACT AERS Inhale 2 puffs into the lungs daily. 4 g 3  ? tiZANidine (ZANAFLEX) 4 MG tablet Take 1 tablet (4 mg total) by mouth 3 (three) times daily. 270 tablet 1  ? traZODone (DESYREL) 100 MG tablet Take 2 tablets (200 mg total) by mouth at bedtime. 180 tablet 1  ?  ?Allergies  ?Allergen Reactions  ? Mucinex [Guaifenesin Er] Other (See Comments)  ?  Jerky movements  ? ? ? ? ?ROS:  ?A comprehensive review of systems was negative except for: left sided breast cancer ? ?Blood pressure (!) 157/71, pulse 83, temperature 98 ?F (36.7 ?C), resp. rate 17, height 5' (1.524 m), weight 70.3 kg, SpO2 96 %. ?Physical Exam ?Vitals reviewed.  ?HENT:  ?   Head: Normocephalic.  ?Eyes:  ?   Extraocular Movements: Extraocular movements intact.  ?Cardiovascular:  ?   Rate and Rhythm: Normal rate.  ?Abdominal:  ?   General: There is no distension.  ?   Palpations: Abdomen is soft.  ?   Tenderness: There is no abdominal tenderness.  ?Musculoskeletal:     ?   General: No swelling.  ?   Cervical back: Normal range of motion.  ?Skin: ?   General: Skin is warm.  ?Neurological:  ?   General: No focal deficit present.  ?   Mental Status: She is alert.  ?Psychiatric:     ?   Mood and Affect: Mood normal.  ? ? ?Results: ?None ? ? ?Assessment & Plan:  ?Holly Phillips is a 66 y.o. female with left sided breast cancer, needing a port. We will do the right side for the port. Discussed risk of bleeding, infection, malfunction, injury to  vessels, and pneumothorax.  ? ?All questions were answered to the satisfaction of the patient. ? ? ? ?Virl Cagey ?07/30/2021, 8:41 AM  ? ? ? ? ? ?

## 2021-07-30 NOTE — Discharge Instructions (Signed)
Keep area clean and dry. You can take a shower in 24 hours. Do not submerge in water.  ?Take tylenol and ibuprofen for pain control and narcotic from home for severe pain.  ? ?

## 2021-07-30 NOTE — Progress Notes (Signed)
Pharmacist Chemotherapy Monitoring - Initial Assessment   ? ?Anticipated start date: 3/20223  ? ?The following has been reviewed per standard work regarding the patient's treatment regimen: ?The patient's diagnosis, treatment plan and drug doses, and organ/hematologic function ?Lab orders and baseline tests specific to treatment regimen  ?The treatment plan start date, drug sequencing, and pre-medications ?Prior authorization status  ?Patient's documented medication list, including drug-drug interaction screen and prescriptions for anti-emetics and supportive care specific to the treatment regimen ?The drug concentrations, fluid compatibility, administration routes, and timing of the medications to be used ?The patient's access for treatment and lifetime cumulative dose history, if applicable  ?The patient's medication allergies and previous infusion related reactions, if applicable  ? ?Changes made to treatment plan:  ?switch to insurance preferred biosimilar ? ?Follow up needed:  ?N/A ? ?The following biosimilar Ziextenzo ([id number]) has been selected for use in this patient.  ? ? ?Wynona Neat, [facility], ?07/30/2021  4:11 PM ? ?

## 2021-07-30 NOTE — Transfer of Care (Signed)
Immediate Anesthesia Transfer of Care Note ? ?Patient: Educational psychologist ? ?Procedure(s) Performed: INSERTION PORT-A-CATH (Right: Chest) ? ?Patient Location: PACU ? ?Anesthesia Type:MAC ? ?Level of Consciousness: awake, alert , oriented and patient cooperative ? ?Airway & Oxygen Therapy: Patient Spontanous Breathing and Patient connected to nasal cannula oxygen ? ?Post-op Assessment: Report given to RN, Post -op Vital signs reviewed and stable and Patient moving all extremities ? ?Post vital signs: Reviewed and stable ? ?Last Vitals:  ?Vitals Value Taken Time  ?BP 192/86 07/30/21 0937  ?Temp    ?Pulse 99 07/30/21 0939  ?Resp 17 07/30/21 0939  ?SpO2 93 % 07/30/21 0939  ?Vitals shown include unvalidated device data. ? ?Last Pain:  ?Vitals:  ? 07/30/21 0731  ?PainSc: 4   ?   ? ?  ? ?Complications: No notable events documented. ?

## 2021-07-30 NOTE — Anesthesia Preprocedure Evaluation (Signed)
Anesthesia Evaluation  ?Patient identified by MRN, date of birth, ID band ?Patient awake ? ? ? ?Reviewed: ?Allergy & Precautions, H&P , NPO status , Patient's Chart, lab work & pertinent test results, reviewed documented beta blocker date and time  ? ?Airway ?Mallampati: II ? ?TM Distance: >3 FB ?Neck ROM: full ? ? ? Dental ?no notable dental hx. ? ?  ?Pulmonary ?asthma , sleep apnea , COPD, Current Smoker and Patient abstained from smoking.,  ?  ?Pulmonary exam normal ?breath sounds clear to auscultation ? ? ? ? ? ? Cardiovascular ?Exercise Tolerance: Good ?negative cardio ROS ? ? ?Rhythm:regular Rate:Normal ? ? ?  ?Neuro/Psych ?PSYCHIATRIC DISORDERS Anxiety Depression TIA  ? GI/Hepatic ?Neg liver ROS, GERD  Medicated,  ?Endo/Other  ?negative endocrine ROS ? Renal/GU ?negative Renal ROS  ?negative genitourinary ?  ?Musculoskeletal ? ? Abdominal ?  ?Peds ? Hematology ?negative hematology ROS ?(+)   ?Anesthesia Other Findings ? ? Reproductive/Obstetrics ?negative OB ROS ? ?  ? ? ? ? ? ? ? ? ? ? ? ? ? ?  ?  ? ? ? ? ? ? ? ? ?Anesthesia Physical ?Anesthesia Plan ? ?ASA: 3 ? ?Anesthesia Plan: General  ? ?Post-op Pain Management:   ? ?Induction:  ? ?PONV Risk Score and Plan: Propofol infusion ? ?Airway Management Planned:  ? ?Additional Equipment:  ? ?Intra-op Plan:  ? ?Post-operative Plan:  ? ?Informed Consent: I have reviewed the patients History and Physical, chart, labs and discussed the procedure including the risks, benefits and alternatives for the proposed anesthesia with the patient or authorized representative who has indicated his/her understanding and acceptance.  ? ? ? ?Dental Advisory Given ? ?Plan Discussed with: CRNA ? ?Anesthesia Plan Comments:   ? ? ? ? ? ? ?Anesthesia Quick Evaluation ? ?

## 2021-07-31 ENCOUNTER — Encounter (HOSPITAL_COMMUNITY): Payer: Self-pay | Admitting: General Surgery

## 2021-08-01 ENCOUNTER — Inpatient Hospital Stay: Payer: Medicare Other

## 2021-08-01 ENCOUNTER — Inpatient Hospital Stay (HOSPITAL_COMMUNITY): Payer: Medicare Other

## 2021-08-01 ENCOUNTER — Other Ambulatory Visit: Payer: Self-pay

## 2021-08-01 ENCOUNTER — Encounter (HOSPITAL_COMMUNITY): Payer: Self-pay

## 2021-08-01 ENCOUNTER — Encounter (HOSPITAL_COMMUNITY): Payer: Self-pay | Admitting: Hematology

## 2021-08-01 DIAGNOSIS — Z95828 Presence of other vascular implants and grafts: Secondary | ICD-10-CM

## 2021-08-01 HISTORY — DX: Presence of other vascular implants and grafts: Z95.828

## 2021-08-01 MED ORDER — LIDOCAINE-PRILOCAINE 2.5-2.5 % EX CREA: TOPICAL_CREAM | CUTANEOUS | 3 refills | Status: DC

## 2021-08-01 MED ORDER — PROCHLORPERAZINE MALEATE 10 MG PO TABS: 10.0000 mg | ORAL_TABLET | Freq: Four times a day (QID) | ORAL | 1 refills | Status: DC | PRN

## 2021-08-01 NOTE — Progress Notes (Signed)

## 2021-08-05 ENCOUNTER — Inpatient Hospital Stay (HOSPITAL_COMMUNITY): Payer: Medicare Other

## 2021-08-05 ENCOUNTER — Other Ambulatory Visit (HOSPITAL_COMMUNITY): Payer: Medicare Other

## 2021-08-05 ENCOUNTER — Inpatient Hospital Stay (HOSPITAL_BASED_OUTPATIENT_CLINIC_OR_DEPARTMENT_OTHER): Payer: Medicare Other | Admitting: Hematology

## 2021-08-05 ENCOUNTER — Telehealth: Payer: Self-pay | Admitting: Physician Assistant

## 2021-08-05 ENCOUNTER — Other Ambulatory Visit: Payer: Self-pay

## 2021-08-05 VITALS — BP 121/62 | HR 91 | Temp 97.5°F | Resp 18

## 2021-08-05 DIAGNOSIS — Z170 Estrogen receptor positive status [ER+]: Secondary | ICD-10-CM

## 2021-08-05 DIAGNOSIS — Z95828 Presence of other vascular implants and grafts: Secondary | ICD-10-CM

## 2021-08-05 DIAGNOSIS — C50412 Malignant neoplasm of upper-outer quadrant of left female breast: Secondary | ICD-10-CM | POA: Diagnosis not present

## 2021-08-05 LAB — COMPREHENSIVE METABOLIC PANEL
ALT: 11 U/L (ref 0–44)
AST: 16 U/L (ref 15–41)
Albumin: 3.8 g/dL (ref 3.5–5.0)
Alkaline Phosphatase: 90 U/L (ref 38–126)
Anion gap: 9 (ref 5–15)
BUN: 14 mg/dL (ref 8–23)
CO2: 31 mmol/L (ref 22–32)
Calcium: 9.6 mg/dL (ref 8.9–10.3)
Chloride: 97 mmol/L — ABNORMAL LOW (ref 98–111)
Creatinine, Ser: 0.82 mg/dL (ref 0.44–1.00)
GFR, Estimated: >60 mL/min (ref >60)
Glucose, Bld: 124 mg/dL — ABNORMAL HIGH (ref 70–99)
Potassium: 4.2 mmol/L (ref 3.5–5.1)
Sodium: 137 mmol/L (ref 135–145)
Total Bilirubin: 0.5 mg/dL (ref 0.3–1.2)
Total Protein: 6.6 g/dL (ref 6.5–8.1)

## 2021-08-05 LAB — CBC WITH DIFFERENTIAL/PLATELET
Abs Immature Granulocytes: 0.02 10*3/uL (ref 0.00–0.07)
Basophils Absolute: 0.1 10*3/uL (ref 0.0–0.1)
Basophils Relative: 1 %
Eosinophils Absolute: 0.2 10*3/uL (ref 0.0–0.5)
Eosinophils Relative: 2 %
HCT: 39.3 % (ref 36.0–46.0)
Hemoglobin: 12.2 g/dL (ref 12.0–15.0)
Immature Granulocytes: 0 %
Lymphocytes Relative: 30 %
Lymphs Abs: 2.8 10*3/uL (ref 0.7–4.0)
MCH: 30.3 pg (ref 26.0–34.0)
MCHC: 31 g/dL (ref 30.0–36.0)
MCV: 97.8 fL (ref 80.0–100.0)
Monocytes Absolute: 0.8 10*3/uL (ref 0.1–1.0)
Monocytes Relative: 8 %
Neutro Abs: 5.4 10*3/uL (ref 1.7–7.7)
Neutrophils Relative %: 59 %
Platelets: 326 10*3/uL (ref 150–400)
RBC: 4.02 MIL/uL (ref 3.87–5.11)
RDW: 12.1 % (ref 11.5–15.5)
WBC: 9.2 10*3/uL (ref 4.0–10.5)
nRBC: 0 % (ref 0.0–0.2)

## 2021-08-05 LAB — MAGNESIUM: Magnesium: 1.4 mg/dL — ABNORMAL LOW (ref 1.7–2.4)

## 2021-08-05 MED ORDER — SODIUM CHLORIDE 0.9 % IV SOLN: Freq: Once | INTRAVENOUS | Status: AC

## 2021-08-05 MED ORDER — SODIUM CHLORIDE 0.9% FLUSH
10.0000 mL | INTRAVENOUS | Status: DC | PRN
  Administered 2021-08-05: 10 mL

## 2021-08-05 MED ORDER — SODIUM CHLORIDE 0.9 % IV SOLN
60.0000 mg/m2 | Freq: Once | INTRAVENOUS | Status: AC
  Administered 2021-08-05: 100 mg via INTRAVENOUS
  Filled 2021-08-05: qty 10

## 2021-08-05 MED ORDER — SODIUM CHLORIDE 0.9 % IV SOLN
480.0000 mg/m2 | Freq: Once | INTRAVENOUS | Status: AC
  Administered 2021-08-05: 840 mg via INTRAVENOUS
  Filled 2021-08-05: qty 42

## 2021-08-05 MED ORDER — MAGNESIUM OXIDE -MG SUPPLEMENT 400 (240 MG) MG PO TABS: 400.0000 mg | ORAL_TABLET | Freq: Three times a day (TID) | ORAL | 2 refills | Status: DC

## 2021-08-05 MED ORDER — PALONOSETRON HCL INJECTION 0.25 MG/5ML
0.2500 mg | Freq: Once | INTRAVENOUS | Status: AC
  Administered 2021-08-05: 0.25 mg via INTRAVENOUS
  Filled 2021-08-05: qty 5

## 2021-08-05 MED ORDER — HEPARIN SOD (PORK) LOCK FLUSH 100 UNIT/ML IV SOLN
500.0000 [IU] | Freq: Once | INTRAVENOUS | Status: AC | PRN
  Administered 2021-08-05: 500 [IU]

## 2021-08-05 MED ORDER — SODIUM CHLORIDE 0.9 % IV SOLN
10.0000 mg | Freq: Once | INTRAVENOUS | Status: AC
  Administered 2021-08-05: 10 mg via INTRAVENOUS
  Filled 2021-08-05: qty 10

## 2021-08-05 MED ORDER — LANREOTIDE ACETATE 120 MG/0.5ML ~~LOC~~ SOLN
SUBCUTANEOUS | Status: AC
  Filled 2021-08-05: qty 120

## 2021-08-05 MED ORDER — MAGNESIUM SULFATE 2 GM/50ML IV SOLN
2.0000 g | INTRAVENOUS | Status: AC
  Administered 2021-08-05 (×2): 2 g via INTRAVENOUS
  Filled 2021-08-05 (×2): qty 50

## 2021-08-05 MED ORDER — LORAZEPAM 2 MG/ML IJ SOLN
0.5000 mg | Freq: Once | INTRAMUSCULAR | Status: AC
  Administered 2021-08-05: 0.5 mg via INTRAVENOUS
  Filled 2021-08-05: qty 0.25

## 2021-08-05 NOTE — Patient Instructions (Signed)
[facility]  Discharge Instructions: ?Thank you for choosing [facility] to provide your oncology and hematology care.  ?If you have a lab appointment with the [facility], please come in thru the Main Entrance and check in at the main information desk. ? ?Wear comfortable clothing and clothing appropriate for easy access to any Portacath or PICC line.  ? ?We strive to give you quality time with your provider. You may need to reschedule your appointment if you arrive late (15 or more minutes).  Arriving late affects you and other patients whose appointments are after yours.  Also, if you miss three or more appointments without notifying the office, you may be dismissed from the clinic at the provider?s discretion.    ?  ?For prescription refill requests, have your pharmacy contact our office and allow 72 hours for refills to be completed.   ? ?Today you received the following chemotherapy and/or immunotherapy agents taxotere and cytoxan.   ?  ?To help prevent nausea and vomiting after your treatment, we encourage you to take your nausea medication as directed. ? ?BELOW ARE SYMPTOMS THAT SHOULD BE REPORTED IMMEDIATELY: ?*FEVER GREATER THAN 100.4 F (38 ?C) OR HIGHER ?*CHILLS OR SWEATING ?*NAUSEA AND VOMITING THAT IS NOT CONTROLLED WITH YOUR NAUSEA MEDICATION ?*UNUSUAL SHORTNESS OF BREATH ?*UNUSUAL BRUISING OR BLEEDING ?*URINARY PROBLEMS (pain or burning when urinating, or frequent urination) ?*BOWEL PROBLEMS (unusual diarrhea, constipation, pain near the anus) ?TENDERNESS IN MOUTH AND THROAT WITH OR WITHOUT PRESENCE OF ULCERS (sore throat, sores in mouth, or a toothache) ?UNUSUAL RASH, SWELLING OR PAIN  ?UNUSUAL VAGINAL DISCHARGE OR ITCHING  ? ?Items with * indicate a potential emergency and should be followed up as soon as possible or go to the Emergency Department if any problems should occur. ? ?Please show the CHEMOTHERAPY ALERT CARD or IMMUNOTHERAPY ALERT CARD at check-in to the  Emergency Department and triage nurse. ? ?Should you have questions after your visit or need to cancel or reschedule your appointment, please contact [facility] [phone]  and follow the prompts.  Office hours are 8:00 a.m. to 4:30 p.m. Monday - Friday. Please note that voicemails left after 4:00 p.m. may not be returned until the following business day.  We are closed weekends and major holidays. You have access to a nurse at all times for urgent questions. Please call the main number to the clinic [phone] and follow the prompts. ? ?For any non-urgent questions, you may also contact your provider using MyChart. We now offer e-Visits for anyone 15 and older to request care online for non-urgent symptoms. For details visit [email or web address]. ?  ?Also download the MyChart app! Go to the app store, search "MyChart", open the app, select [facility], and log in with your MyChart username and password. ? ?Due to Covid, a mask is required upon entering the hospital/clinic. If you do not have a mask, one will be given to you upon arrival. For doctor visits, patients may have 1 support person aged 17 or older with them. For treatment visits, patients cannot have anyone with them due to current Covid guidelines and our immunocompromised population.  ?

## 2021-08-05 NOTE — Progress Notes (Signed)
Patient has been examined by Dr. Katragadda, and vital signs and labs have been reviewed. ANC, Creatinine, LFTs, hemoglobin, and platelets are within treatment parameters per M.D. - pt may proceed with treatment.    °

## 2021-08-05 NOTE — Progress Notes (Signed)
Magnesium 1.4 today.  Patient to receive Magnesium 4 g and Ativan 0.5 mg IV today verbal order Dr. Delton Coombes.  ? ?Patient tolerated chemotherapy with no complaints voiced.  Side effects with management reviewed with understanding verbalized.  Port site clean and dry with no bruising or swelling noted at site.  Good blood return noted before and after administration of chemotherapy.  Band aid applied.  Patient left in satisfactory condition with VSS and no s/s of distress noted.   ?

## 2021-08-05 NOTE — Patient Instructions (Signed)
[facility] at [facility] ?Discharge Instructions ? ? ?You were seen and examined today by Dr. Delton Coombes. ? ?He reviewed the results of your lab work, which is normal/stable.  Your magnesium is low.  We will give you IV magnesium in the clinic today. ? ?We will proceed with your treatment today. ? ?Return as scheduled.  ? ? ?Thank you for choosing [facility] at [facility] to provide your oncology and hematology care.  To afford each patient quality time with our provider, please arrive at least 15 minutes before your scheduled appointment time.  ? ?If you have a lab appointment with the [facility] please come in thru the Main Entrance and check in at the main information desk. ? ?You need to re-schedule your appointment should you arrive 10 or more minutes late.  We strive to give you quality time with our providers, and arriving late affects you and other patients whose appointments are after yours.  Also, if you no show three or more times for appointments you may be dismissed from the clinic at the providers discretion.     ?Again, thank you for choosing [facility].  Our hope is that these requests will decrease the amount of time that you wait before being seen by our physicians.       ?_____________________________________________________________ ? ?Should you have questions after your visit to [facility], please contact our office at [phone] and follow the prompts.  Our office hours are 8:00 a.m. and 4:30 p.m. Monday - Friday.  Please note that voicemails left after 4:00 p.m. may not be returned until the following business day.  We are closed weekends and major holidays.  You do have access to a nurse 24-7, just call the main number to the clinic [phone] and do not press any options, hold on the line and a nurse will answer the phone.   ? ?For prescription refill requests, have your pharmacy contact our office and allow  72 hours.   ? ?Due to Covid, you will need to wear a mask upon entering the hospital. If you do not have a mask, a mask will be given to you at the Main Entrance upon arrival. For doctor visits, patients may have 1 support person age 26 or older with them. For treatment visits, patients can not have anyone with them due to social distancing guidelines and our immunocompromised population.  ? ?   ?

## 2021-08-05 NOTE — Progress Notes (Signed)
? ?[facility] ?[address] ?[place], [place] [address] ? ? ?CLINIC:  ?Medical Oncology/Hematology ? ?PCP:  ?Inda Coke, [place] ?[place] / [place] [address] ?[phone] ? ? ?REASON FOR VISIT:  ?Follow-up for left breast cancer ? ?PRIOR THERAPY: none ? ?NGS Results: not done ? ?CURRENT THERAPY: TC q21d ? ?BRIEF ONCOLOGIC HISTORY:  ?Oncology History  ?Malignant neoplasm of upper-outer quadrant of left breast in female, estrogen receptor positive ([place])  ?05/14/2021 Initial Diagnosis  ? Malignant neoplasm of upper-outer quadrant of left breast in female, estrogen receptor positive ([place] [facility]) ?  ?08/05/2021 -  Chemotherapy  ? Patient is on Treatment Plan : BREAST TC q21d  ?   ? Genetic Testing  ? Negative genetic testing. No pathogenic variants identified on the [facility] Multi-Cancer+RNA panel. The report date is 07/25/2021. ? ?The Multi-Cancer Panel + RNA offered by [facility] includes sequencing and/or deletion duplication testing of the following 84 genes: AIP, ALK, APC, ATM, AXIN2,BAP1,  BARD1, BLM, BMPR1A, BRCA1, BRCA2, BRIP1, CASR, CDC73, CDH1, CDK4, CDKN1B, CDKN1C, CDKN2A (p14ARF), CDKN2A (p16INK4a), CEBPA, CHEK2, CTNNA1, DICER1, DIS3L2, EGFR (c.2369C>T, p.Thr790Met variant only), EPCAM (Deletion/duplication testing only), FH, FLCN, GATA2, GPC3, GREM1 (Promoter region deletion/duplication testing only), HOXB13 (c.251G>A, p.Gly84Glu), HRAS, KIT, MAX, MEN1, MET, MITF (c.952G>A, p.Glu318Lys variant only), MLH1, MSH2, MSH3, MSH6, MUTYH, NBN, NF1, NF2, NTHL1, PALB2, PDGFRA, PHOX2B, PMS2, POLD1, POLE, POT1, PRKAR1A, PTCH1, PTEN, RAD50, RAD51C, RAD51D, RB1, RECQL4, RET, RUNX1, SDHAF2, SDHA (sequence changes only), SDHB, SDHC, SDHD, SMAD4, SMARCA4, SMARCB1, SMARCE1, STK11, SUFU, TERC, TERT, TMEM127, TP53, TSC1, TSC2, VHL, WRN and WT1. ?  ? ? ?CANCER STAGING: ? Cancer Staging  ?Malignant neoplasm of upper-outer quadrant of left breast in female, estrogen receptor positive ([place]) ?Staging form: Breast,  AJCC 8th Edition ?- Clinical stage from 07/01/2021: Stage IB (cT1b, cN0, cM0, G3, ER+, PR-, HER2-) - Unsigned ? ? ?INTERVAL HISTORY:  ?Holly Phillips, a 66 y.o. female, returns for routine follow-up and consideration for next cycle of chemotherapy. Amaal was last seen on 07/25/2021. ? ?Due for cycle #1 of TC today.  ? ?Overall, she tells me she has been feeling pretty well, and she is accompanied by her sister. She reports she is anxious about starting treatment and is nervous about losing her hair. She started taking calcium and vitamin D, and she is not taking magnesium. Her sister reports she is often sleepy. She currently is taking Lexapro for her anxiety.  ? ?Overall, she feels ready for next cycle of chemo today.  ? ?REVIEW OF SYSTEMS:  ?Review of Systems  ?Constitutional:  Negative for appetite change and fatigue.  ?Psychiatric/Behavioral:  Positive for depression. The patient is nervous/anxious.   ?All other systems reviewed and are negative. ? ?PAST MEDICAL/SURGICAL HISTORY:  ?Past Medical History:  ?Diagnosis Date  ? Anxiety   ? Arthritis   ? Asthma   ? Chronic back pain   ? Colon polyps   ? COPD (chronic obstructive pulmonary disease) ([facility])   ? Depression   ? Emphysema of lung ([facility])   ? Family history of breast cancer   ? GERD (gastroesophageal reflux disease)   ? Memory change   ? Port-A-Cath in place 08/01/2021  ? Sleep apnea   ? TIA (transient ischemic attack)   ? Tremor   ? ?Past Surgical History:  ?Procedure Laterality Date  ? ABDOMINAL HYSTERECTOMY    ? BREAST LUMPECTOMY WITH RADIOFREQUENCY TAG IDENTIFICATION Left 9/57/4734  ? Procedure: BREAST LUMPECTOMY WITH RADIOFREQUENCY TAG IDENTIFICATION;  Surgeon: Constance Haw,  Lanell Matar, MD;  Location: [facility];  Service: General;  Laterality: Left;  ? CERVICAL SPINE SURGERY    ? INNER EAR SURGERY    ? LUMBAR FUSION  July 27, 2010  ? L3-L7  ? PARTIAL MASTECTOMY WITH AXILLARY SENTINEL LYMPH NODE BIOPSY Left 05/31/2021  ? Procedure: PARTIAL MASTECTOMY WITH AXILLARY  SENTINEL LYMPH NODE BIOPSY;  Surgeon: Virl Cagey, MD;  Location: [facility];  Service: General;  Laterality: Left;  ? PORTACATH PLACEMENT Right 07/30/2021  ? Procedure: INSERTION PORT-A-CATH;  Surgeon: Virl Cagey, MD;  Location: [facility];  Service: General;  Laterality: Right;  ? ? ?SOCIAL HISTORY:  ?Social History  ? ?Socioeconomic History  ? Marital status: Widowed  ?  Spouse name: Not on file  ? Number of children: 1  ? Years of education: 67  ? Highest education level: 11th grade  ?Occupational History  ? Not on file  ?Tobacco Use  ? Smoking status: Every Day  ?  Packs/day: 0.50  ?  Types: Cigarettes  ?  Last attempt to quit: 07/17/2020  ?  Years since quitting: 1.0  ? Smokeless tobacco: Never  ? Tobacco comments:  ?  Not smoking cigarettes but is vaping  04/22/21  ?Vaping Use  ? Vaping Use: Every day  ? Substances: Nicotine, Flavoring  ?Substance and Sexual Activity  ? Alcohol use: Not Currently  ? Drug use: Not Currently  ?  Types: Other-see comments  ?  Comment: CBD and medical marijuana- denies 8/28/2,04/22/21  ? Sexual activity: Not Currently  ?Other Topics Concern  ? Not on file  ?Social History Narrative  ? 04/22/21 Lives alone  ? From [place], moved to [place] in 06/29/19  ? Husband passed away in July 26, 2016  ? ?Social Determinants of Health  ? ?Financial Resource Strain: Not on file  ?Food Insecurity: Not on file  ?Transportation Needs: Not on file  ?Physical Activity: Not on file  ?Stress: Not on file  ?Social Connections: Not on file  ?Intimate Partner Violence: Not on file  ? ? ?FAMILY HISTORY:  ?Family History  ?Problem Relation Age of Onset  ? Depression Mother   ? Diabetes Mother   ? Hypertension Mother   ? Hyperlipidemia Mother   ? Heart attack Mother   ? Osteoarthritis Father   ? Asthma Father   ? COPD Father   ? Breast cancer Sister   ?     dx 51s-60s  ? Lupus Sister   ? Osteoarthritis Sister   ? Asthma Sister   ? COPD Sister   ? Diabetes Sister   ? Drug abuse Sister   ? Heart attack Sister   ?  Heart attack Maternal Grandmother   ? Colon cancer Neg Hx   ? Esophageal cancer Neg Hx   ? ? ?CURRENT MEDICATIONS:  ?Current Outpatient Medications  ?Medication Sig Dispense Refill  ? albuterol (PROVENTIL) (2.5 MG/3ML) 0.083% nebulizer solution Take 3 mLs (2.5 mg total) by nebulization every 6 (six) hours as needed for wheezing or shortness of breath. 150 mL 1  ? albuterol (VENTOLIN HFA) 108 (90 Base) MCG/ACT inhaler INHALE 1 TO 2 PUFFS INTO THE LUNGS EVERY 6 HOURS AS NEEDED FOR WHEEZING OR SHORTNESS OF BREATH 18 g 5  ? atorvastatin (LIPITOR) 20 MG tablet Take 1 tablet (20 mg total) by mouth daily. 90 tablet 3  ? budesonide-formoterol (SYMBICORT) 160-4.5 MCG/ACT inhaler Inhale 2 puffs into the lungs 2 (two) times daily. 10.2 g 11  ? celecoxib (CELEBREX) 100 MG capsule Take  1 capsule (100 mg total) by mouth 2 (two) times daily. 180 capsule 1  ? cyclobenzaprine (FLEXERIL) 10 MG tablet Take 1 tablet (10 mg total) by mouth 3 (three) times daily. 90 tablet 1  ? CYCLOPHOSPHAMIDE IV Inject into the vein every 21 ( twenty-one) days. X 4 cycles    ? DOCETAXEL IV Inject into the vein every 21 ( twenty-one) days. X 4 cycles    ? escitalopram (LEXAPRO) 20 MG tablet Take 1 tablet (20 mg total) by mouth daily. 90 tablet 1  ? furosemide (LASIX) 20 MG tablet Take 20 mg by mouth daily as needed for fluid.    ? gabapentin (NEURONTIN) 300 MG capsule Take 600-1,200 mg by mouth See admin instructions. 600 mg 3 times daily, 1200 mg at bedtime    ? lidocaine (LIDODERM) 5 % Place 3 patches onto the skin daily as needed (pain).    ? magnesium oxide (MAG-OX) 400 (240 Mg) MG tablet Take 1 tablet (400 mg total) by mouth in the morning, at noon, and at bedtime. 90 tablet 2  ? montelukast (SINGULAIR) 10 MG tablet Take 1 tablet (10 mg total) by mouth at bedtime. 90 tablet 1  ? omeprazole (PRILOSEC) 40 MG capsule Take 1 capsule (40 mg total) by mouth 2 (two) times daily. 90 capsule 1  ? oxyCODONE-acetaminophen (PERCOCET) 10-325 MG tablet Take 1  tablet by mouth 6 (six) times daily.    ? predniSONE (DELTASONE) 20 MG tablet Take 2 tablets (40 mg total) by mouth daily. 10 tablet 0  ? Tiotropium Bromide Monohydrate (SPIRIVA RESPIMAT) 2.5 MCG/ACT AERS I

## 2021-08-05 NOTE — Telephone Encounter (Signed)
.. ?  Encourage patient to contact the pharmacy for refills or they can request refills through [facility] ? ?LAST APPOINTMENT DATE:  06/25/21 ? ?NEXT APPOINTMENT DATE: none on file ? ?MEDICATION:gabapentin 300  ? ?Is the patient out of medication? Yes  ? ?PHARMACY: [facility]- [address]  ? ?Let patient know to contact pharmacy at the end of the day to make sure medication is ready. ? ?Please notify patient to allow 48-72 hours to process  ?

## 2021-08-06 ENCOUNTER — Inpatient Hospital Stay (HOSPITAL_COMMUNITY): Payer: Medicare Other

## 2021-08-07 ENCOUNTER — Emergency Department (HOSPITAL_COMMUNITY): Payer: Medicare Other

## 2021-08-07 ENCOUNTER — Encounter (HOSPITAL_COMMUNITY): Payer: Self-pay

## 2021-08-07 ENCOUNTER — Inpatient Hospital Stay (HOSPITAL_COMMUNITY): Payer: Medicare Other

## 2021-08-07 ENCOUNTER — Other Ambulatory Visit: Payer: Self-pay

## 2021-08-07 ENCOUNTER — Emergency Department (HOSPITAL_COMMUNITY)
Admission: EM | Admit: 2021-08-07 | Discharge: 2021-08-07 | Disposition: A | Discharge status: Home / Self Care | Payer: Medicare Other | Attending: Emergency Medicine | Admitting: Emergency Medicine

## 2021-08-07 ENCOUNTER — Telehealth: Payer: Self-pay

## 2021-08-07 DIAGNOSIS — Z853 Personal history of malignant neoplasm of breast: Secondary | ICD-10-CM | POA: Diagnosis not present

## 2021-08-07 DIAGNOSIS — R519 Headache, unspecified: Secondary | ICD-10-CM | POA: Insufficient documentation

## 2021-08-07 DIAGNOSIS — R42 Dizziness and giddiness: Secondary | ICD-10-CM | POA: Diagnosis present

## 2021-08-07 DIAGNOSIS — R0602 Shortness of breath: Secondary | ICD-10-CM | POA: Diagnosis not present

## 2021-08-07 DIAGNOSIS — W19XXXA Unspecified fall, initial encounter: Secondary | ICD-10-CM

## 2021-08-07 DIAGNOSIS — Z859 Personal history of malignant neoplasm, unspecified: Secondary | ICD-10-CM | POA: Insufficient documentation

## 2021-08-07 LAB — CBC WITH DIFFERENTIAL/PLATELET
Abs Immature Granulocytes: 0.04 10*3/uL (ref 0.00–0.07)
Basophils Absolute: 0 10*3/uL (ref 0.0–0.1)
Basophils Relative: 0 %
Eosinophils Absolute: 0 10*3/uL (ref 0.0–0.5)
Eosinophils Relative: 0 %
HCT: 36.3 % (ref 36.0–46.0)
Hemoglobin: 10.9 g/dL — ABNORMAL LOW (ref 12.0–15.0)
Immature Granulocytes: 1 %
Lymphocytes Relative: 19 %
Lymphs Abs: 1.6 10*3/uL (ref 0.7–4.0)
MCH: 30.5 pg (ref 26.0–34.0)
MCHC: 30 g/dL (ref 30.0–36.0)
MCV: 101.7 fL — ABNORMAL HIGH (ref 80.0–100.0)
Monocytes Absolute: 0.4 10*3/uL (ref 0.1–1.0)
Monocytes Relative: 4 %
Neutro Abs: 6.6 10*3/uL (ref 1.7–7.7)
Neutrophils Relative %: 76 %
Platelets: 251 10*3/uL (ref 150–400)
RBC: 3.57 MIL/uL — ABNORMAL LOW (ref 3.87–5.11)
RDW: 12.5 % (ref 11.5–15.5)
WBC: 8.7 10*3/uL (ref 4.0–10.5)
nRBC: 0 % (ref 0.0–0.2)

## 2021-08-07 LAB — BASIC METABOLIC PANEL
Anion gap: 7 (ref 5–15)
BUN: 18 mg/dL (ref 8–23)
CO2: 31 mmol/L (ref 22–32)
Calcium: 8.9 mg/dL (ref 8.9–10.3)
Chloride: 100 mmol/L (ref 98–111)
Creatinine, Ser: 0.63 mg/dL (ref 0.44–1.00)
GFR, Estimated: >60 mL/min (ref >60)
Glucose, Bld: 132 mg/dL — ABNORMAL HIGH (ref 70–99)
Potassium: 5 mmol/L (ref 3.5–5.1)
Sodium: 138 mmol/L (ref 135–145)

## 2021-08-07 LAB — TROPONIN I (HIGH SENSITIVITY)
Troponin I (High Sensitivity): 68 ng/L — ABNORMAL HIGH (ref <18)
Troponin I (High Sensitivity): 68 ng/L — ABNORMAL HIGH (ref <18)

## 2021-08-07 LAB — BRAIN NATRIURETIC PEPTIDE: B Natriuretic Peptide: 381 pg/mL — ABNORMAL HIGH (ref 0.0–100.0)

## 2021-08-07 IMAGING — MR MR HEAD W/O CM
11 of 12 series · 41 of 48 positions shown · non-contrast
Comparison: Head CT from earlier the same day

CLINICAL DATA: Dizziness and headache for a couple of days. Unable
to hold objects without dropping them.

EXAM:
MRI HEAD WITHOUT CONTRAST
TECHNIQUE: Multiplanar, multiecho pulse sequences of the brain and surrounding
structures were obtained without intravenous contrast.

[Series 5: DWI · axial · 4.0mm · 0.88mm/px · z∈[-66,+71]mm · 4 of 36 slices shown (1 of 6)]
[im 1/36]
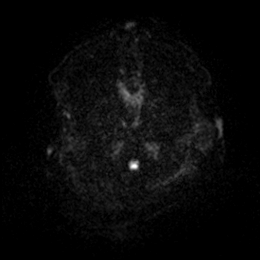
[im 12/36]
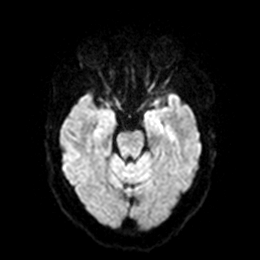
[im 24/36]
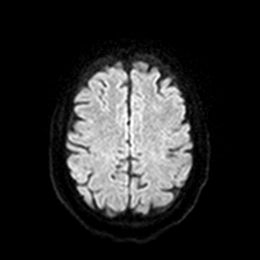
[im 36/36]
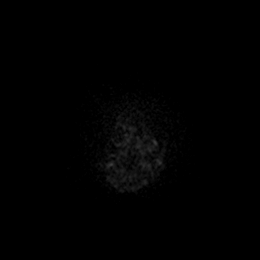

[Series 5: DWI · axial · 4.0mm · 0.88mm/px · z∈[-66,+71]mm · 4 of 36 slices shown (2 of 6)]
[im 1/36]
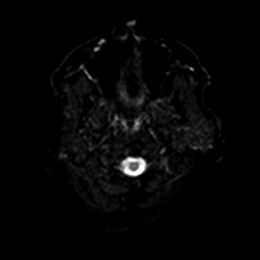
[im 12/36]
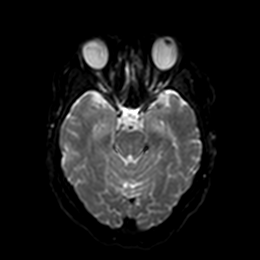
[im 24/36]
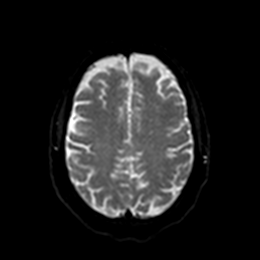
[im 36/36]
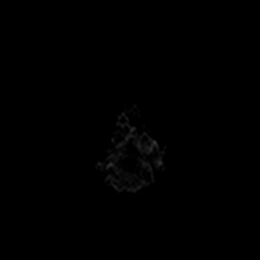

[Series 6: DWI · axial · 4.0mm · 0.88mm/px · z∈[-66,+71]mm · 4 of 36 slices shown (3 of 6)]
[im 1/36]
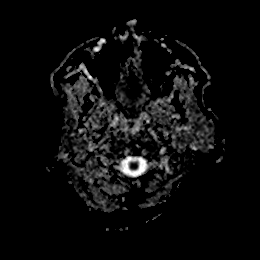
[im 12/36]
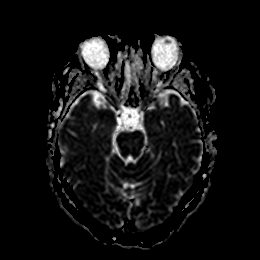
[im 24/36]
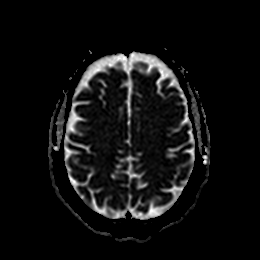
[im 36/36]
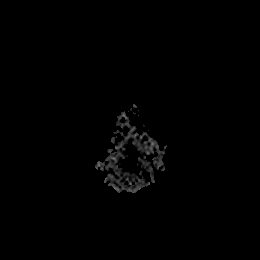

[Series 7: DWI · coronal · 4.0mm · 0.88mm/px · 4 of 32 slices shown (4 of 6)]
[im 1/32]
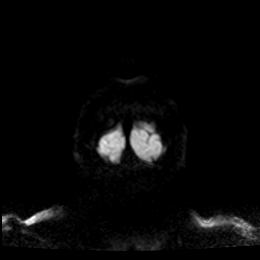
[im 11/32]
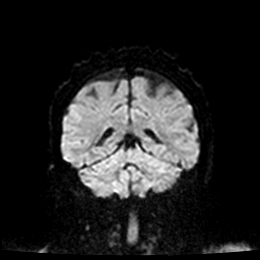
[im 21/32]
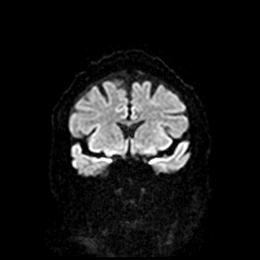
[im 32/32]
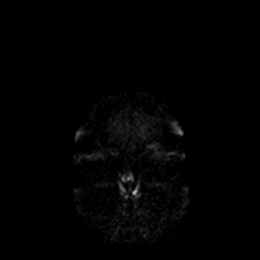

[Series 7: DWI · coronal · 4.0mm · 0.88mm/px · 4 of 32 slices shown (5 of 6)]
[im 1/32]
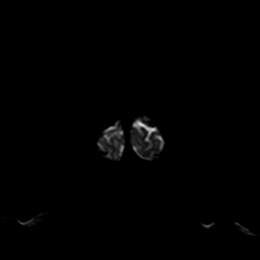
[im 11/32]
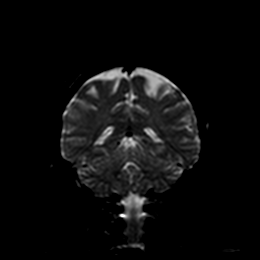
[im 21/32]
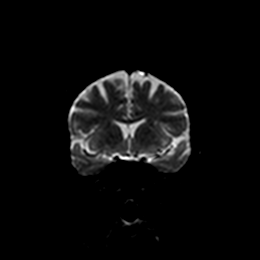
[im 32/32]
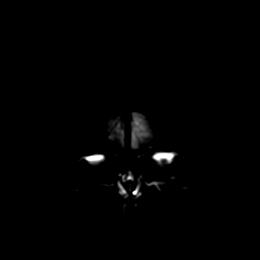

[Series 8: DWI · coronal · 4.0mm · 0.88mm/px · 4 of 32 slices shown (6 of 6)]
[im 1/32]
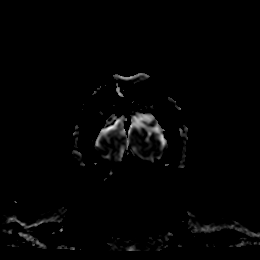
[im 11/32]
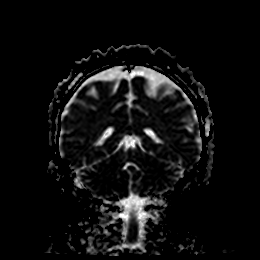
[im 21/32]
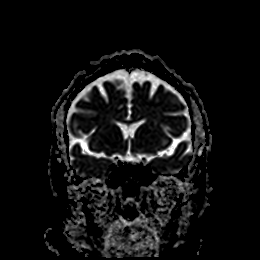
[im 32/32]
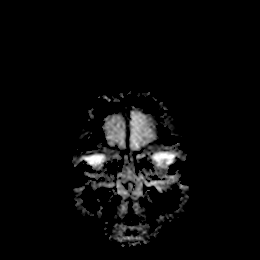

[Series 9: T1 · sagittal · 5.0mm · 0.80mm/px · 3 of 21 slices shown]
[im 1/21]
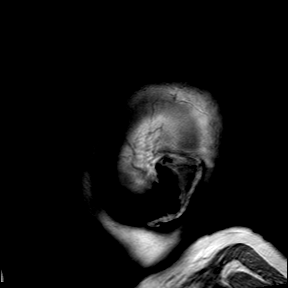
[im 11/21]
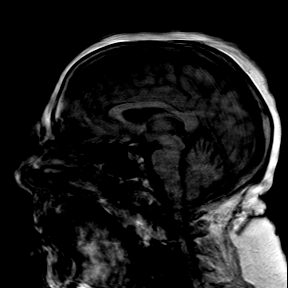
[im 21/21]
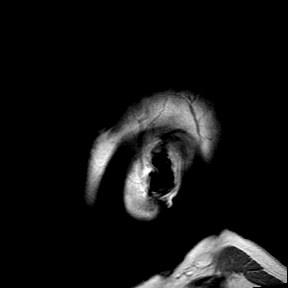

[Series 10: T2 · axial · 5.0mm · 0.72mm/px · z∈[-73,+77]mm · 3 of 23 slices shown (1 of 2)]
[im 1/23]
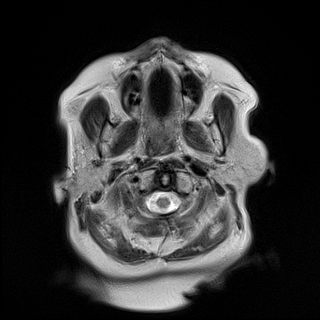
[im 12/23]
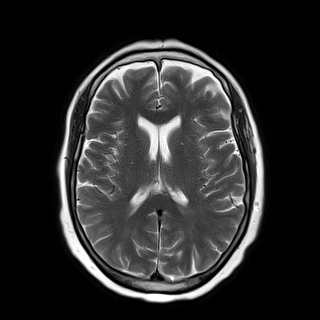
[im 23/23]
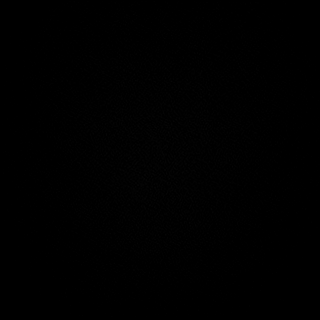

[Series 11: ax hemo · axial · 5.0mm · 0.86mm/px · z∈[-69,+72]mm · 3 of 25 slices shown]
[im 1/25]
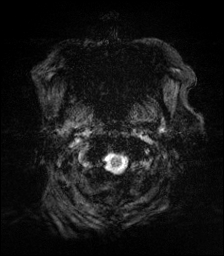
[im 13/25]
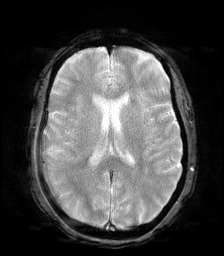
[im 25/25]
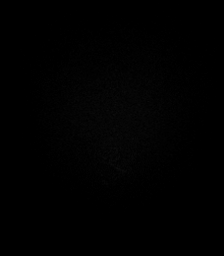

[Series 12: FLAIR · axial · 4.0mm · 0.43mm/px · z∈[-70,+74]mm · 5 of 38 slices shown]
[im 1/38]
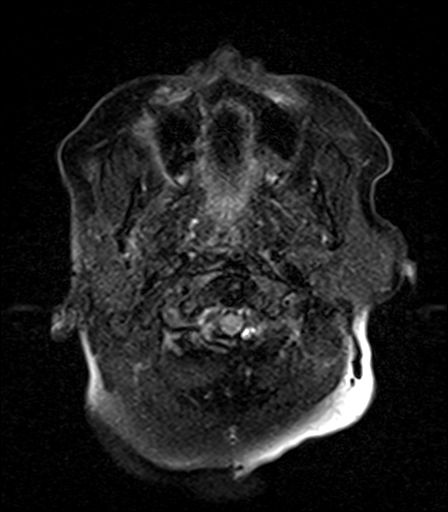
[im 10/38]
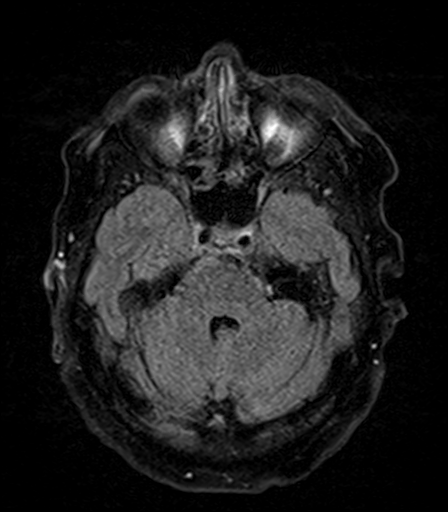
[im 19/38]
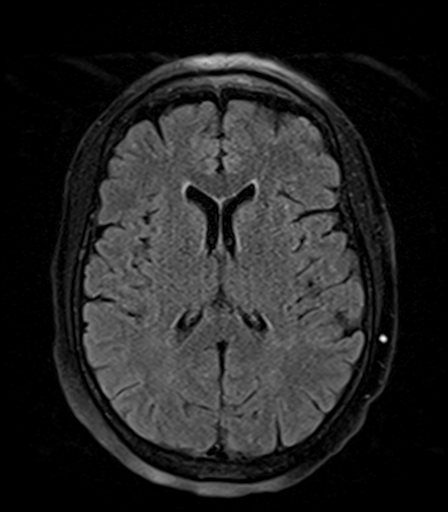
[im 28/38]
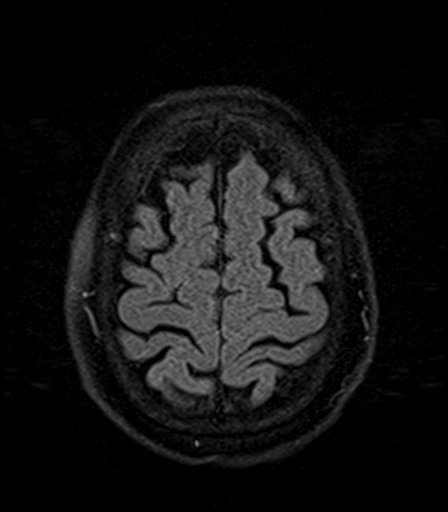
[im 38/38]
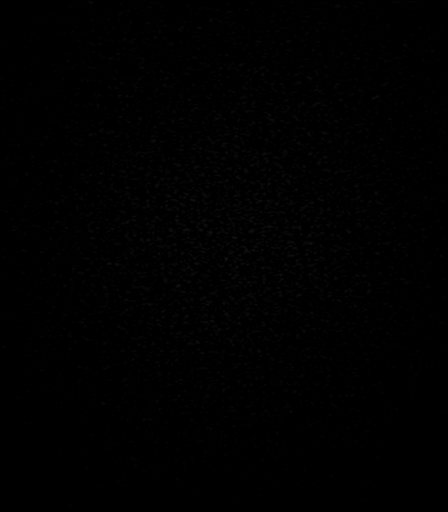

[Series 14: T2 · coronal · 5.0mm · 0.72mm/px · 3 of 28 slices shown (2 of 2)]
[im 1/28]
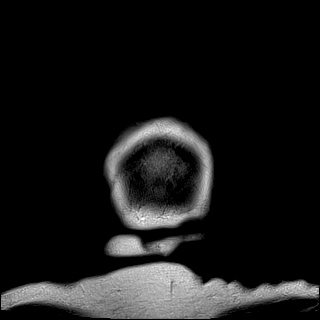
[im 14/28]
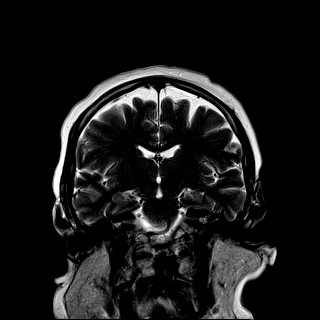
[im 28/28]
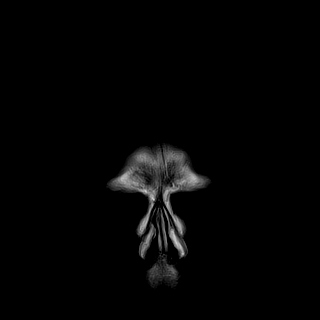

[41 of 48 positions shown; findings below may reference images not displayed]

FINDINGS: Brain: No acute infarction, hemorrhage, hydrocephalus, extra-axial
collection or mass lesion. Normal brain volume. Minor chronic FLAIR
hyperintensity in the cerebral white matter, essentially age
congruent.

Vascular: Normal flow voids

Skull and upper cervical spine: Normal marrow signal. Postoperative
cervical spine with artifact, poorly assessed due to motion on
sagittal images.

Sinuses/Orbits: Unremarkable

Other: Intermittent motion artifact.
IMPRESSION: Negative motion degraded brain MRI.  No explanation for symptoms.

## 2021-08-07 IMAGING — DX DG FOOT COMPLETE 3+V*L*
3 series · 3 of 3 positions shown · non-contrast
Comparison: None.

CLINICAL DATA: Fall, left foot pain

EXAM:
LEFT FOOT - COMPLETE 3+ VIEW

[foot ap]
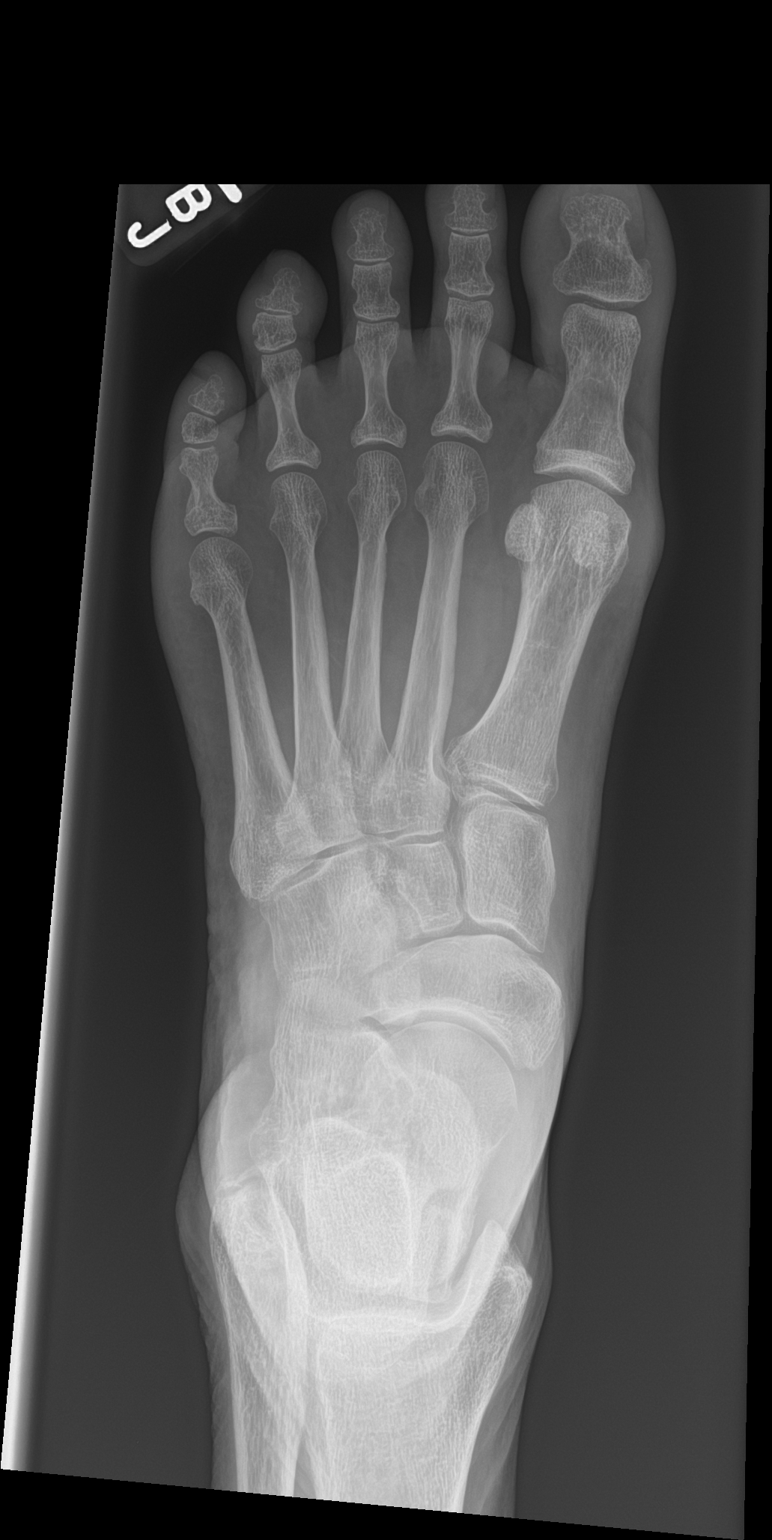

[foot obl]
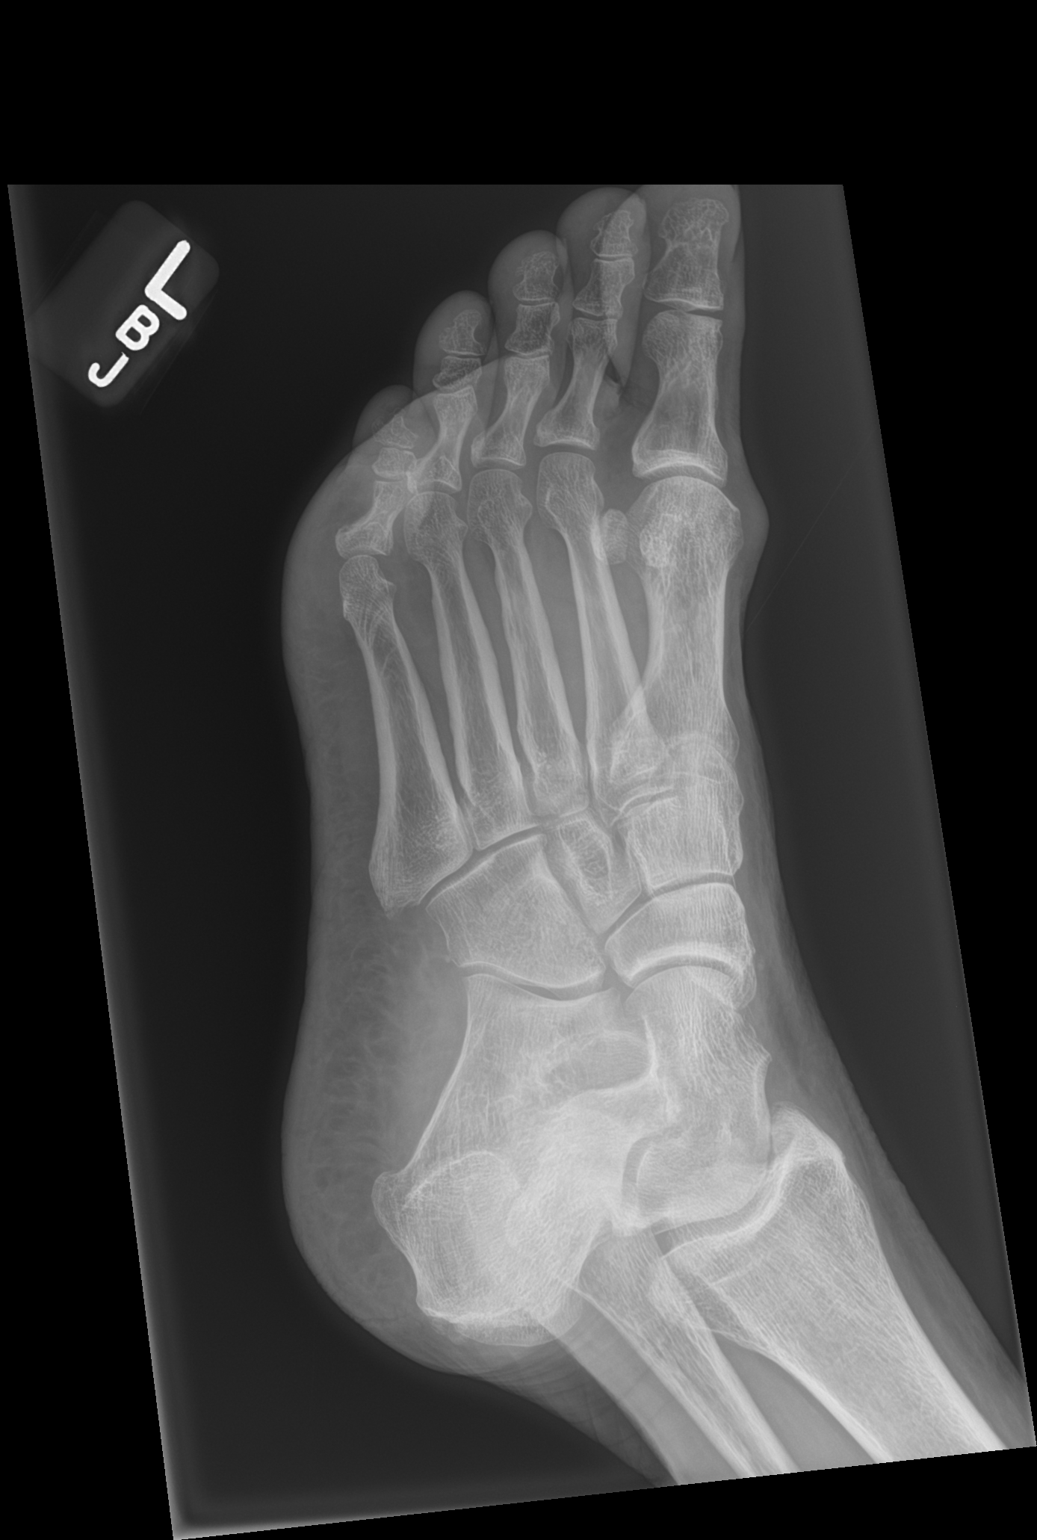

[foot lat]
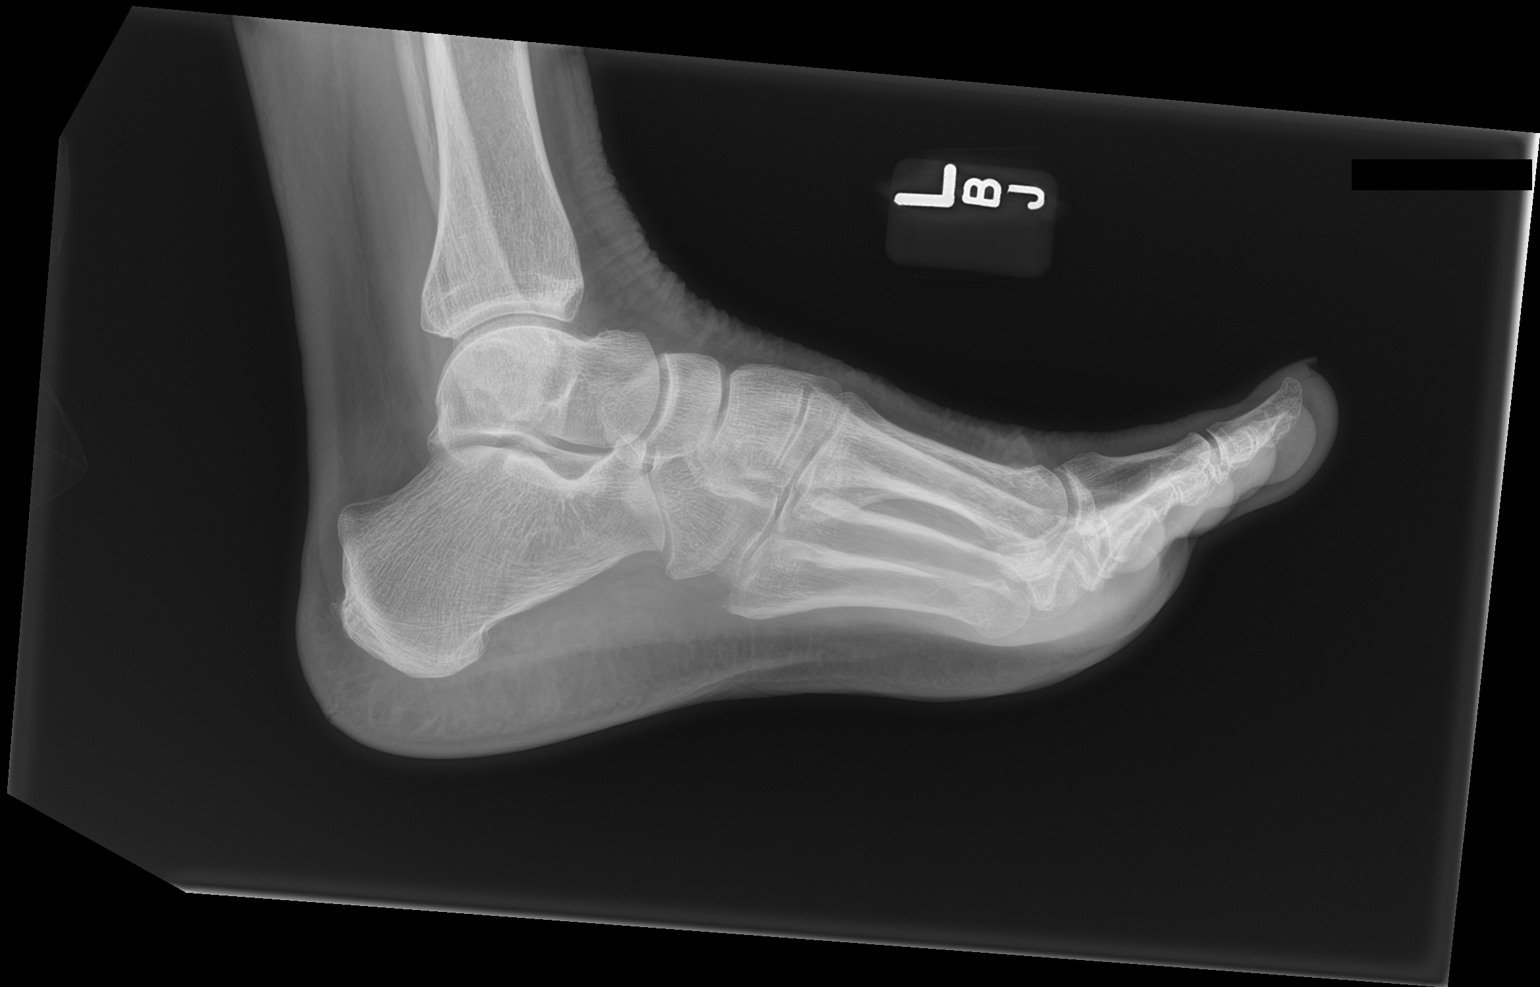

[3 of 3 positions shown; findings below may reference images not displayed]

FINDINGS: There is no evidence of fracture or dislocation. There is no
evidence of arthropathy or other focal bone abnormality. Soft
tissues are unremarkable.
IMPRESSION: No acute osseous finding.

## 2021-08-07 IMAGING — CT CT HEAD W/O CM
4 series · 16 of 47 positions shown, 18 images · non-contrast
Comparison: CT head [DATE]

CLINICAL DATA: Moderate to severe head trauma. Headache and
dizziness. History breast cancer



[Series 2: head w o · axial · 0.40mm/px · z∈[+41,+141]mm · 7 of 28 slices shown, 9 images]
[im 4/28  brain]
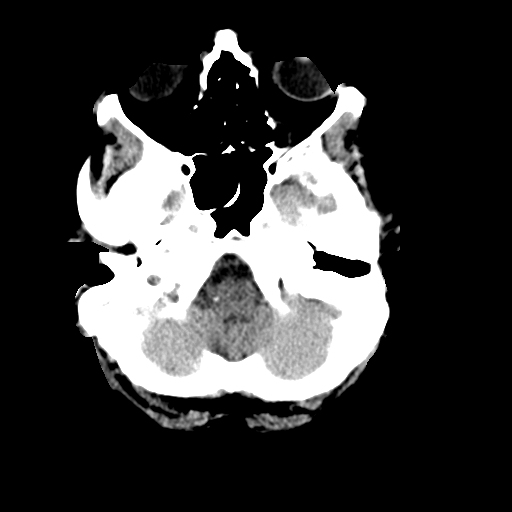
[im 4/28  bone]
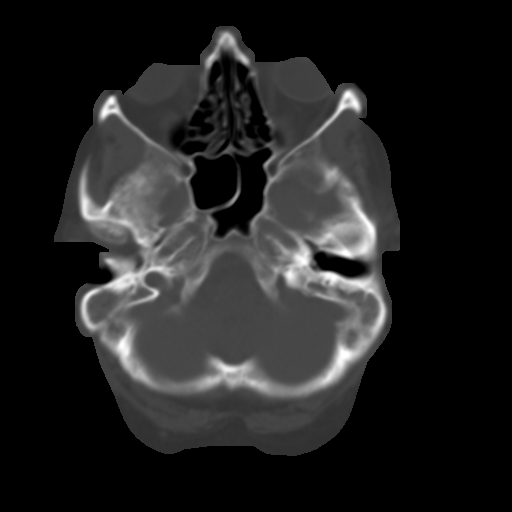
[im 7/28  brain]
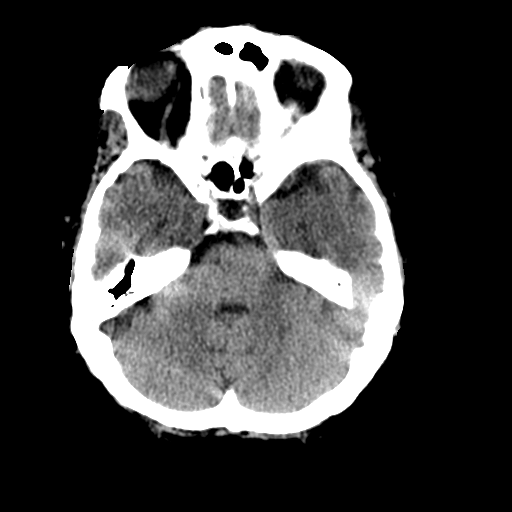
[im 11/28  brain]
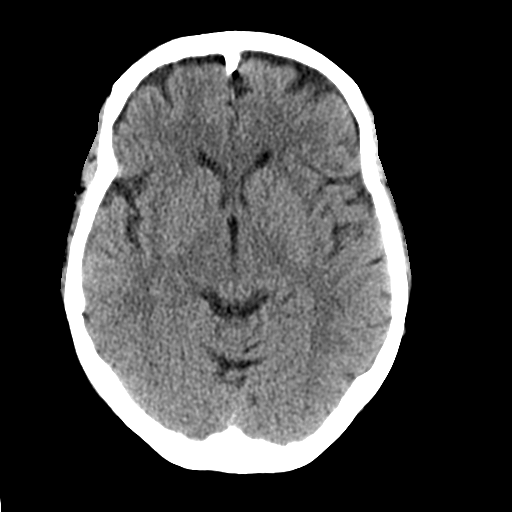
[im 14/28  brain]
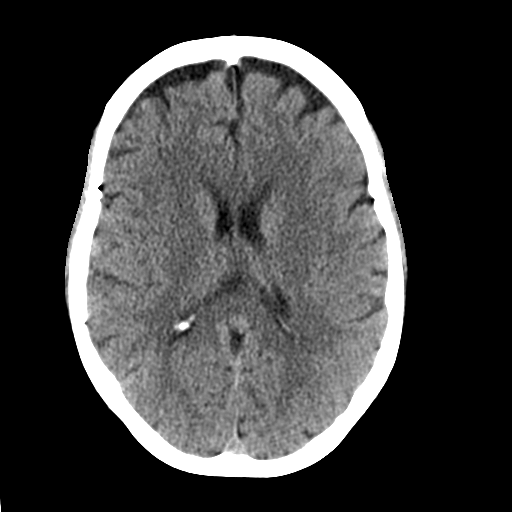
[im 17/28  brain]
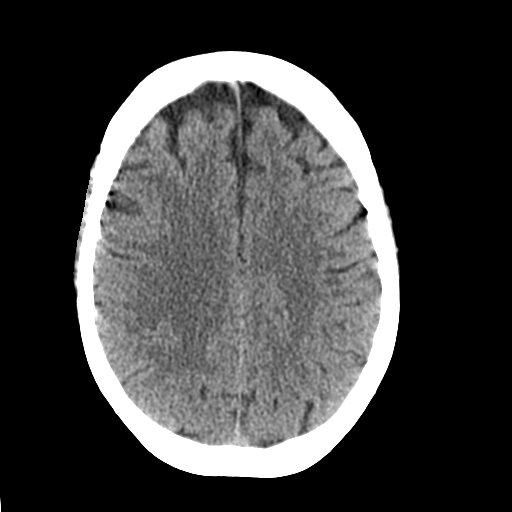
[im 17/28  bone]
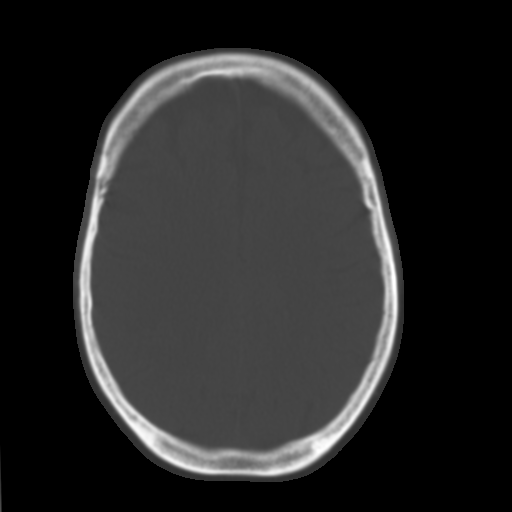
[im 21/28  brain]
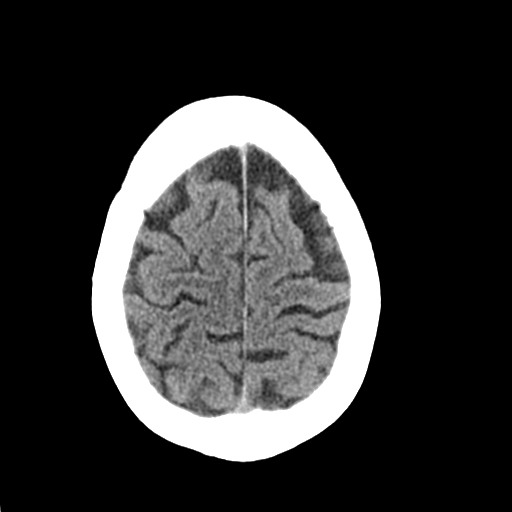
[im 24/28  brain]
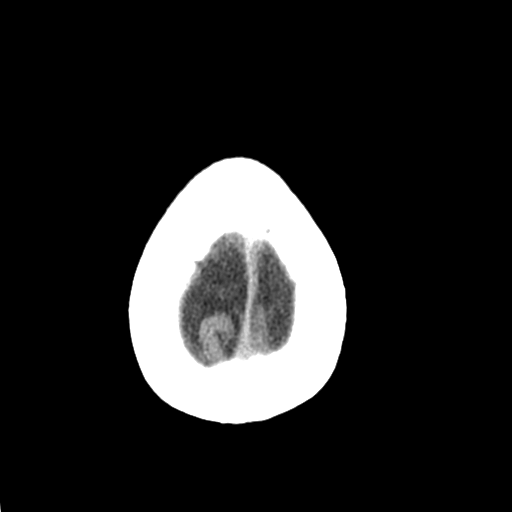

[Series 3: head bone · axial · 0.40mm/px · z∈[+38,+66]mm · 3 of 69 slices shown]
[im 7/69  bone]
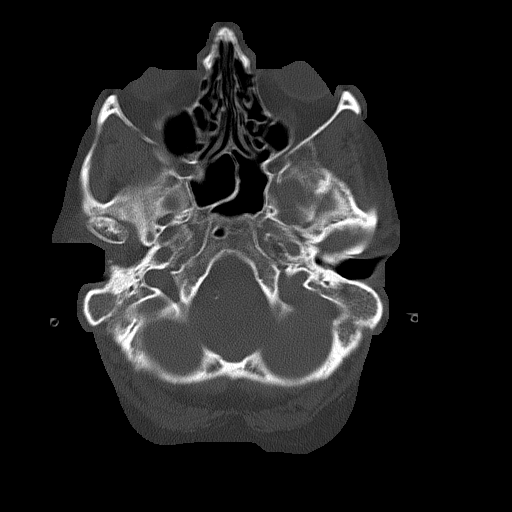
[im 14/69  bone]
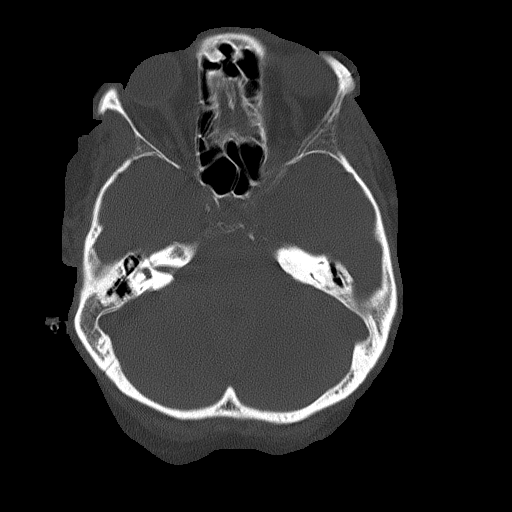
[im 21/69  bone]
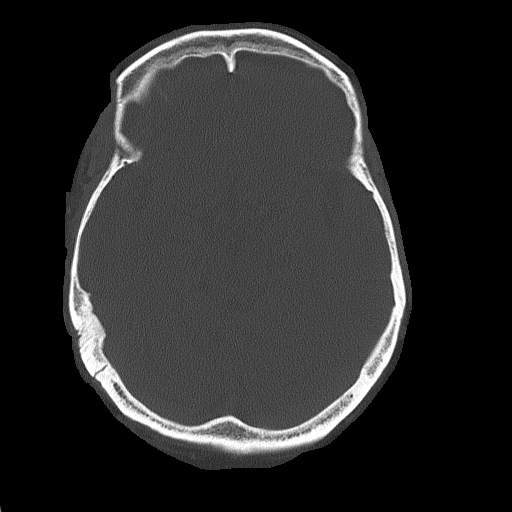

[Series 4: coronal soft · coronal · 0.27mm/px · 3 of 67 slices shown]
[im 23/67  brain]
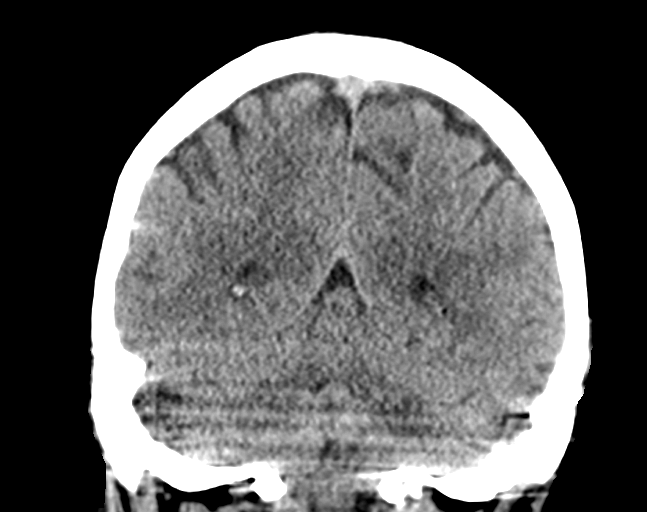
[im 30/67  brain]
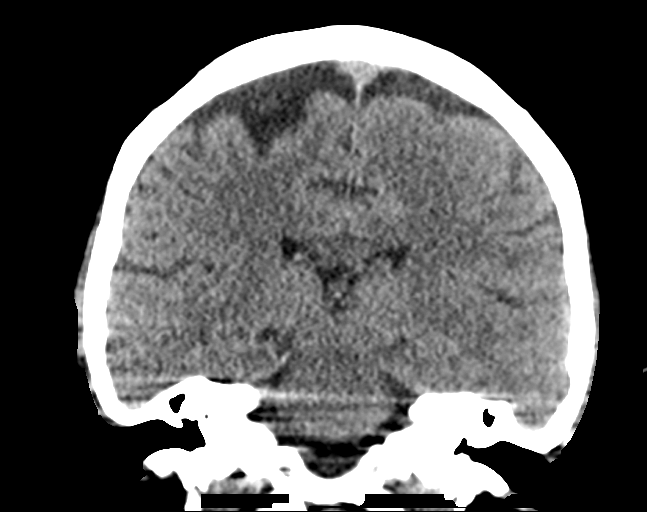
[im 37/67  brain]
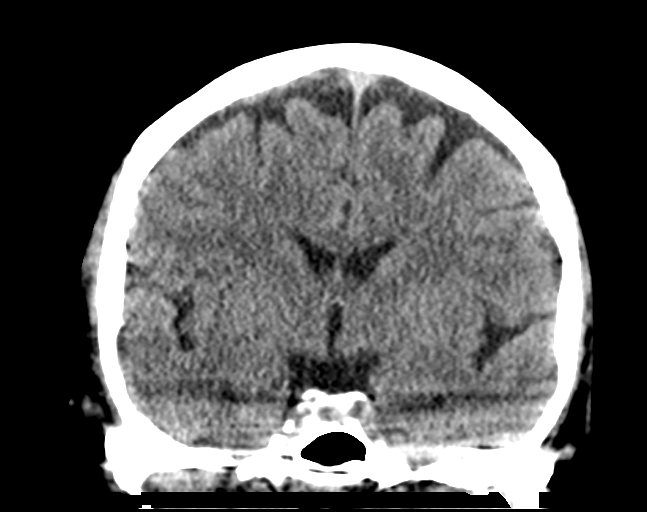

[Series 5: sagittal soft · sagittal · 0.29mm/px · 3 of 53 slices shown]
[im 18/53  brain]
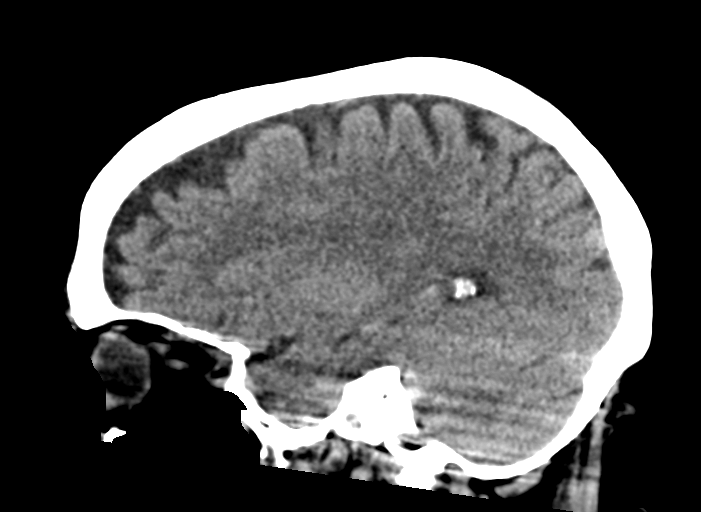
[im 27/53  brain]
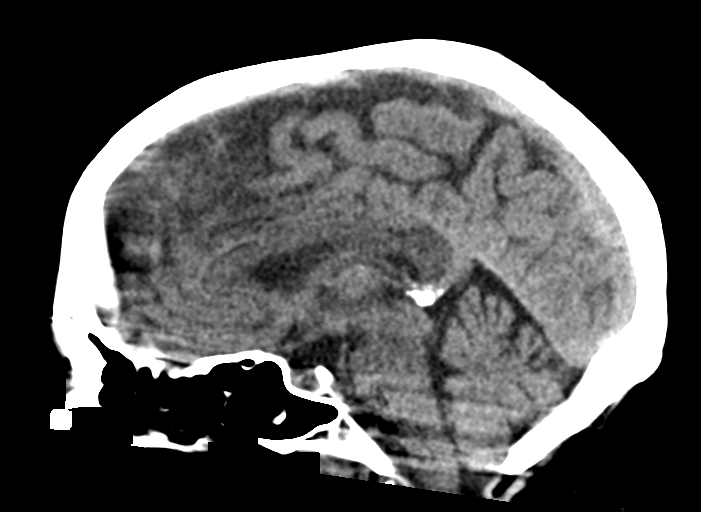
[im 35/53  brain]
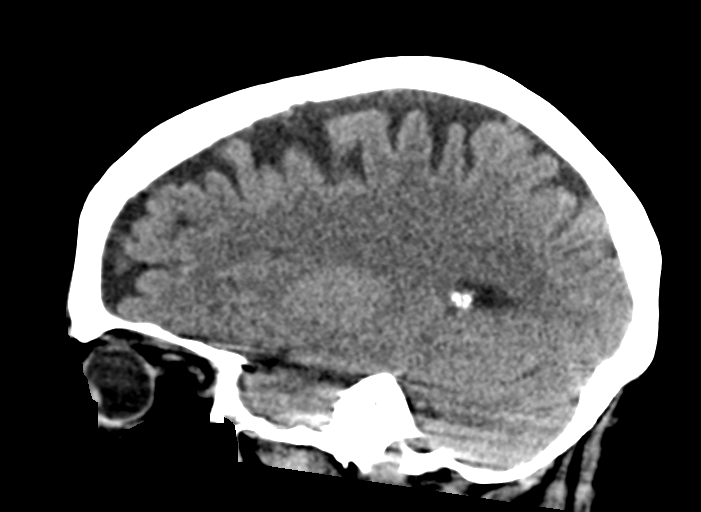

[16 of 47 positions shown; findings below may reference images not displayed]

FINDINGS: Brain: No evidence of acute infarction, hemorrhage, hydrocephalus,
extra-axial collection or mass lesion/mass effect.

Vascular: Negative for hyperdense vessel

Skull: Negative

Sinuses/Orbits: Paranasal sinuses clear. Mastoid air cells
incompletely developed. Negative orbit

Other: None
IMPRESSION: Negative CT head

## 2021-08-07 IMAGING — DX DG CHEST 1V PORT
1 series · 1 of 1 positions shown · non-contrast
Comparison: [DATE]

CLINICAL DATA: Shortness of breath and weakness.  Fall.

EXAM:
PORTABLE CHEST 1 VIEW

[chest ap]
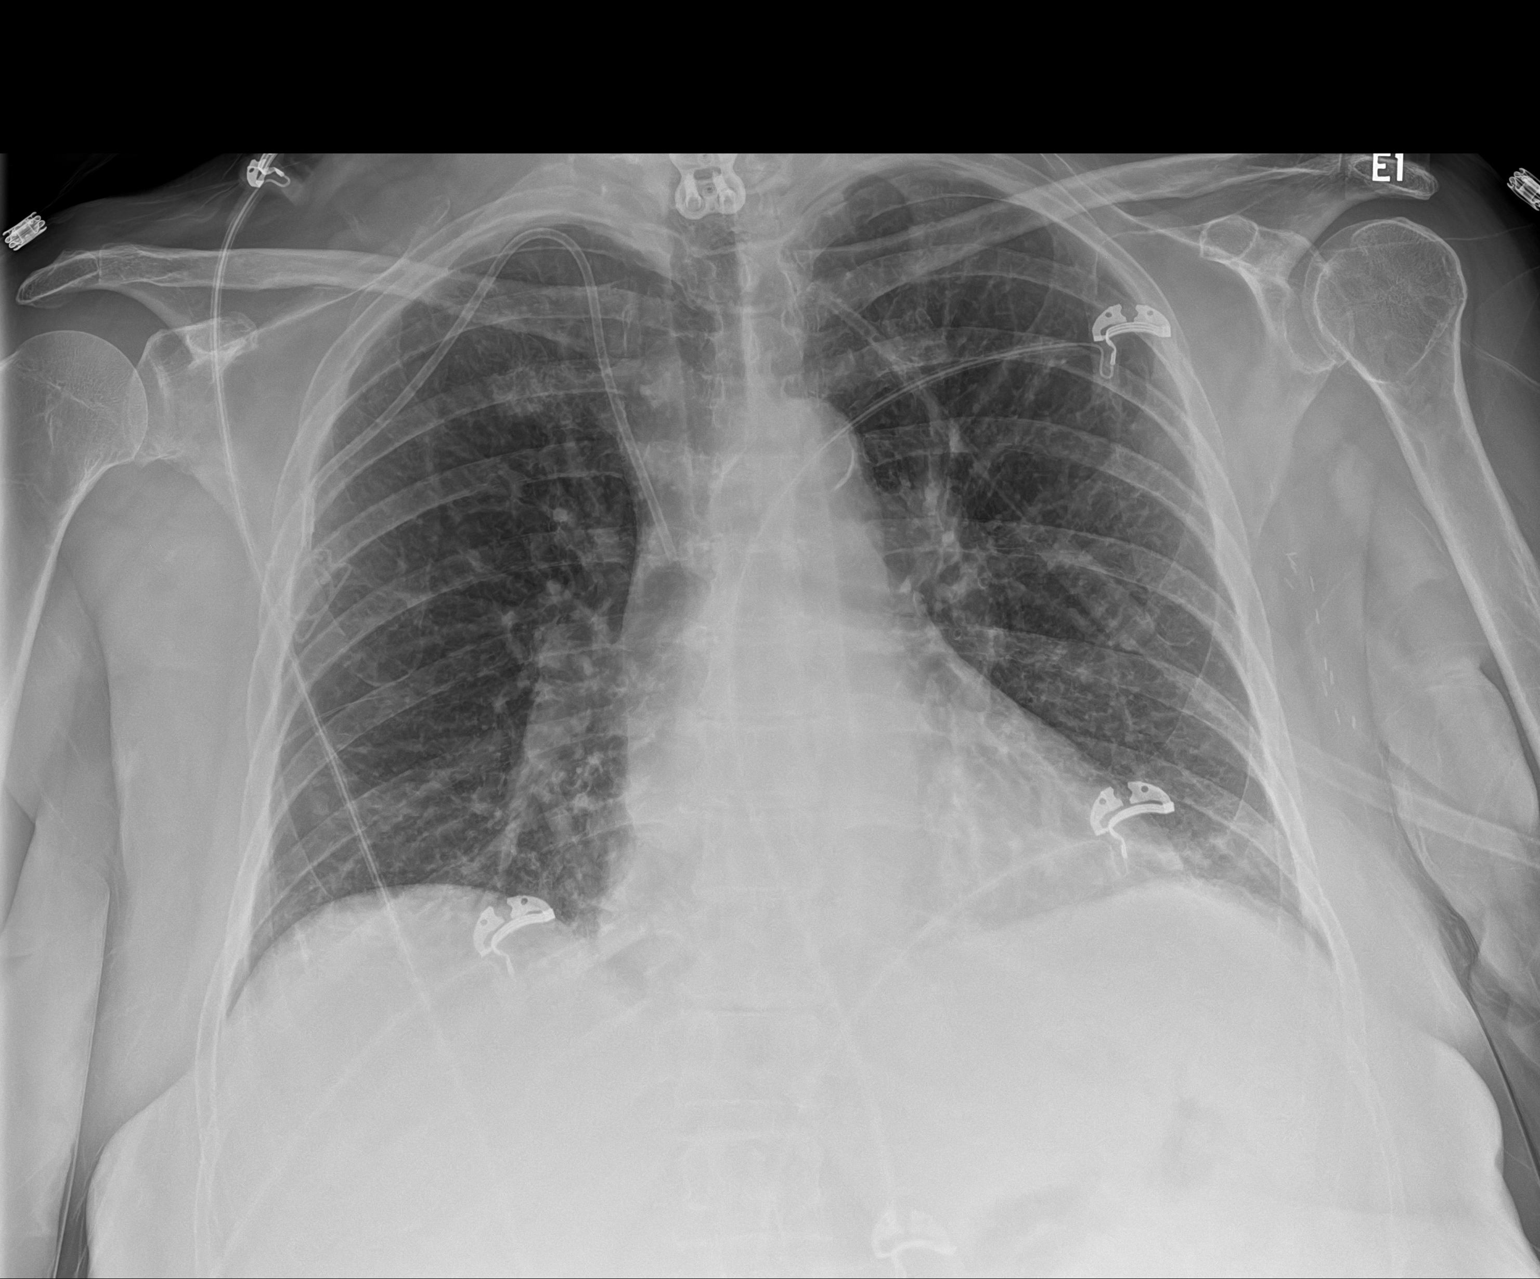

[1 of 1 positions shown; findings below may reference images not displayed]

FINDINGS: Power port on the right is unchanged. Heart size is normal. Aortic
atherosclerotic calcification as seen previously. Mild chronic
scarring or atelectasis at the left base as seen previously. The
remainder the chest is clear. No acute bone finding.
IMPRESSION: Mild atelectasis or scarring at the left base, unchanged since the
prior study.

## 2021-08-07 NOTE — Progress Notes (Signed)
Patient was in the Emergency room today. Nurse called Charge ER nurse to let pt know to come up to clinic and get her injection if she was discharged prior to clinic closing. Patient left the ED AMA. Our schedulers will call pt tomorrow to attempt to get her to come and get her injection ?

## 2021-08-07 NOTE — ED Notes (Signed)
Pt ambulated to bathroom with no assistance.  

## 2021-08-07 NOTE — ED Triage Notes (Signed)
Patient c/o right knee pain, left foot pain, unable to hold items without dropping them, and dizziness with headache for a couple of days. Hx of breast cancer  ?

## 2021-08-07 NOTE — ED Notes (Signed)
Arbie Cookey called concern about pt not being seen properly while in the ED. Nurse talked with Arbie Cookey and went over chart and let her know what things were done for the pt. Arbie Cookey was under the impression pt was here for hours and not been seen by any staff after Doctor came in. Arbie Cookey satisfied with nurse answers for her concerns. 08/07/2021 & 1635hrs ?

## 2021-08-07 NOTE — ED Notes (Signed)
Patient states she does not want to wear her oxygen. Patient educated on importance of wearing oxygen when needed. Patient verbalized understanding.  ?

## 2021-08-07 NOTE — ED Notes (Signed)
Patient transported to MRI 

## 2021-08-07 NOTE — Telephone Encounter (Signed)
Late entry, patient called clinic yesterday 08-06-21. Stated she had fallen twice but denied pain. Patient states she was not eating or drinking much, little nauseated . She had taken nausea medication but states it had not helped much. Patient stated she felt light headed at times. Nurse informed her to take her time when standing up and gather herself beofe walking. Encouraged her to push hydration as well. Informed her when she came in for injection on Wednesday the 22nd that we could draw labs and evaluate her further. Patient understood.  ?

## 2021-08-07 NOTE — ED Notes (Signed)
Patient states "I am not staying here any longer." This nurse explained risks of leaving and importance of talking to a provider prior to leaving. Patient stated she was not staying for this. Unable to obtain updated set of vitals due to refusal. Port deaccessed at this time.  ?

## 2021-08-07 NOTE — ED Provider Notes (Signed)
?[facility] ?Provider Note ? ? ?CSN: [id number] ?Arrival date & time: 08/07/21  1041 ? ?  ? ?History ? ?Chief Complaint  ?Patient presents with  ? Fall  ? Dizziness  ? ? ?Holly Phillips is a 66 y.o. female. ? ?Patient presented to ER chief complaint of lightheadedness dizziness and frequent falls.  She has a history of cancer and is just started chemotherapy with he reports that with had been placed.  Denies headache or chest pain no fever no vomit no cough no diarrhea. ? ? ?  ? ?Home Medications ?Prior to Admission medications   ?Medication Sig Start Date End Date Taking? Authorizing Provider  ?albuterol (PROVENTIL) (2.5 MG/3ML) 0.083% nebulizer solution Take 3 mLs (2.5 mg total) by nebulization every 6 (six) hours as needed for wheezing or shortness of breath. 02/06/21   Inda Coke, PA  ?albuterol (VENTOLIN HFA) 108 (90 Base) MCG/ACT inhaler INHALE 1 TO 2 PUFFS INTO THE LUNGS EVERY 6 HOURS AS NEEDED FOR WHEEZING OR SHORTNESS OF BREATH 04/30/21   Inda Coke, PA  ?atorvastatin (LIPITOR) 20 MG tablet Take 1 tablet (20 mg total) by mouth daily. 02/08/21   Inda Coke, PA  ?budesonide-formoterol [facility]) 160-4.5 MCG/ACT inhaler Inhale 2 puffs into the lungs 2 (two) times daily. 08/24/20   Chesley Mires, MD  ?celecoxib (CELEBREX) 100 MG capsule Take 1 capsule (100 mg total) by mouth 2 (two) times daily. 05/27/21   Inda Coke, PA  ?cyclobenzaprine (FLEXERIL) 10 MG tablet Take 1 tablet (10 mg total) by mouth 3 (three) times daily. 08/01/19   Inda Coke, PA  ?CYCLOPHOSPHAMIDE IV Inject into the vein every 21 ( twenty-one) days. X 4 cycles 08/05/21   [provider]  ?DOCETAXEL IV Inject into the vein every 21 ( twenty-one) days. X 4 cycles 08/05/21   [provider]  ?escitalopram (LEXAPRO) 20 MG tablet Take 1 tablet (20 mg total) by mouth daily. 05/27/21   Inda Coke, PA  ?furosemide (LASIX) 20 MG tablet Take 20 mg by mouth daily as needed for fluid.     [provider]  ?gabapentin (NEURONTIN) 300 MG capsule Take 600-1,200 mg by mouth See admin instructions. 600 mg 3 times daily, 1200 mg at bedtime    [provider]  ?lidocaine (LIDODERM) 5 % Place 3 patches onto the skin daily as needed (pain). 01/16/20   [provider]  ?lidocaine-prilocaine (EMLA) cream Apply a small amount to port a cath site (do not rub in) and cover with plastic wrap 1 hour prior to chemotherapy appointments ?Patient not taking: Reported on 08/05/2021 08/01/21   Derek Jack, MD  ?magnesium oxide (MAG-OX) 400 (240 Mg) MG tablet Take 1 tablet (400 mg total) by mouth in the morning, at noon, and at bedtime. 08/05/21   Derek Jack, MD  ?montelukast (SINGULAIR) 10 MG tablet Take 1 tablet (10 mg total) by mouth at bedtime. 05/27/21   Inda Coke, PA  ?omeprazole (PRILOSEC) 40 MG capsule Take 1 capsule (40 mg total) by mouth 2 (two) times daily. 08/01/19   Inda Coke, PA  ?ondansetron (ZOFRAN) 4 MG tablet Take 1 tablet (4 mg total) by mouth every 8 (eight) hours as needed. ?Patient not taking: Reported on 08/05/2021 05/31/21 05/31/22  Virl Cagey, MD  ?oxyCODONE-acetaminophen (PERCOCET) 10-325 MG tablet Take 1 tablet by mouth 6 (six) times daily. 06/08/19   [provider]  ?predniSONE (DELTASONE) 20 MG tablet Take 2 tablets (40 mg total) by mouth daily. 02/06/21   Inda Coke,  PA  ?prochlorperazine (COMPAZINE) 10 MG tablet Take 1 tablet (10 mg total) by mouth every 6 (six) hours as needed (Nausea or vomiting). ?Patient not taking: Reported on 08/05/2021 08/01/21   Derek Jack, MD  ?Tiotropium Bromide Monohydrate (SPIRIVA RESPIMAT) 2.5 MCG/ACT AERS Inhale 2 puffs into the lungs daily. 05/27/21   Inda Coke, PA  ?tiZANidine (ZANAFLEX) 4 MG tablet Take 1 tablet (4 mg total) by mouth 3 (three) times daily. 06/25/21   Inda Coke, PA  ?traZODone (DESYREL) 100 MG tablet Take 2 tablets (200 mg total) by mouth at bedtime.  05/27/21   Inda Coke, [place]  ?   ? ?Allergies    ?Mucinex [guaifenesin er]   ? ?Review of Systems   ?Review of Systems  ?Constitutional:  Negative for fever.  ?HENT:  Negative for ear pain.   ?Eyes:  Negative for pain.  ?Respiratory:  Negative for cough.   ?Cardiovascular:  Negative for chest pain.  ?Gastrointestinal:  Negative for abdominal pain.  ?Genitourinary:  Negative for flank pain.  ?Musculoskeletal:  Negative for back pain.  ?Skin:  Negative for rash.  ?Neurological:  Negative for headaches.  ? ?Physical Exam ?Updated Vital Signs ?BP (!) 158/92   Pulse 100   Temp 98.4 ?F (36.9 ?C) (Oral)   Resp 15   LMP  (LMP Unknown)   SpO2 91%  ?Physical Exam ?Constitutional:   ?   General: She is not in acute distress. ?   Appearance: Normal appearance.  ?HENT:  ?   Head: Normocephalic.  ?   Nose: Nose normal.  ?Eyes:  ?   Extraocular Movements: Extraocular movements intact.  ?Cardiovascular:  ?   Rate and Rhythm: Normal rate.  ?Pulmonary:  ?   Effort: Pulmonary effort is normal.  ?Musculoskeletal:     ?   General: Normal range of motion.  ?   Cervical back: Normal range of motion.  ?Neurological:  ?   General: No focal deficit present.  ?   Mental Status: She is alert and oriented to person, place, and time. Mental status is at baseline.  ?   Cranial Nerves: No cranial nerve deficit.  ?   Motor: No weakness.  ? ? ?ED Results / Procedures / Treatments   ?Labs ?(all labs ordered are listed, but only abnormal results are displayed) ?Labs Reviewed  ?CBC WITH DIFFERENTIAL/PLATELET - Abnormal; Notable for the following components:  ?    Result Value  ? RBC 3.57 (*)   ? Hemoglobin 10.9 (*)   ? MCV 101.7 (*)   ? All other components within normal limits  ?BASIC METABOLIC PANEL - Abnormal; Notable for the following components:  ? Glucose, Bld 132 (*)   ? All other components within normal limits  ?BRAIN NATRIURETIC PEPTIDE - Abnormal; Notable for the following components:  ? B Natriuretic Peptide 381.0 (*)   ? All other  components within normal limits  ?TROPONIN I (HIGH SENSITIVITY) - Abnormal; Notable for the following components:  ? Troponin I (High Sensitivity) 68 (*)   ? All other components within normal limits  ?TROPONIN I (HIGH SENSITIVITY) - Abnormal; Notable for the following components:  ? Troponin I (High Sensitivity) 68 (*)   ? All other components within normal limits  ? ? ?EKG ?None ? ?Radiology ?CT Head Wo Contrast ? ?Result Date: 08/07/2021 ?CLINICAL DATA:  Moderate to severe head trauma. Headache and dizziness. History breast cancer EXAM: CT HEAD WITHOUT CONTRAST TECHNIQUE: Contiguous axial images were obtained from the base of the  skull through the vertex without intravenous contrast. RADIATION DOSE REDUCTION: This exam was performed according to the departmental dose-optimization program which includes automated exposure control, adjustment of the mA and/or kV according to patient size and/or use of iterative reconstruction technique. COMPARISON:  CT head 01/04/2021 FINDINGS: Brain: No evidence of acute infarction, hemorrhage, hydrocephalus, extra-axial collection or mass lesion/mass effect. Vascular: Negative for hyperdense vessel Skull: Negative Sinuses/Orbits: Paranasal sinuses clear. Mastoid air cells incompletely developed. Negative orbit Other: None IMPRESSION: Negative CT head Electronically Signed   By: Franchot Gallo M.D.   On: 08/07/2021 11:52  ? ?MR BRAIN WO CONTRAST ? ?Result Date: 08/07/2021 ?CLINICAL DATA:  Dizziness and headache for a couple of days. Unable to hold objects without dropping them. EXAM: MRI HEAD WITHOUT CONTRAST TECHNIQUE: Multiplanar, multiecho pulse sequences of the brain and surrounding structures were obtained without intravenous contrast. COMPARISON:  Head CT from earlier the same day FINDINGS: Brain: No acute infarction, hemorrhage, hydrocephalus, extra-axial collection or mass lesion. Normal brain volume. Minor chronic FLAIR hyperintensity in the cerebral white matter,  essentially age congruent. Vascular: Normal flow voids Skull and upper cervical spine: Normal marrow signal. Postoperative cervical spine with artifact, poorly assessed due to motion on sagittal images. Sinuses/Orbits:

## 2021-08-08 ENCOUNTER — Inpatient Hospital Stay (HOSPITAL_COMMUNITY): Payer: Medicare Other

## 2021-08-08 ENCOUNTER — Telehealth (HOSPITAL_COMMUNITY): Payer: Self-pay

## 2021-08-08 NOTE — Telephone Encounter (Signed)
The patients sister called and she is unable to make arrangements to bring the patient today for the injection.  Checked with pharmacy and it is ok to give the injection on Friday.  The sister was called and notified of 0900 so arrangements can be made with transportation services.   ?

## 2021-08-08 NOTE — Telephone Encounter (Signed)
Patient's sister called and left a voice mail concerned about [facility] emergency room visit yesterday.    Returned the phone call, reviewed the patients chart, and offered the Office of Patient Experiences telephone number.  The sister stated she had already spoken with someone yesterday and they reviewed the chart with her, too.  The sister stated she lives 2 hours away and is trying to help the patient.  Instructed the sister Mrs. Borgerding needs to come in today for her injection and we will check her labs and can give hydration if needed.  The main number to the cancer center was given.   ?

## 2021-08-09 ENCOUNTER — Other Ambulatory Visit: Payer: Self-pay

## 2021-08-09 ENCOUNTER — Encounter (HOSPITAL_COMMUNITY): Payer: Self-pay

## 2021-08-09 ENCOUNTER — Inpatient Hospital Stay (HOSPITAL_COMMUNITY): Payer: Medicare Other

## 2021-08-09 VITALS — BP 128/65 | HR 95 | Temp 98.8°F | Resp 18

## 2021-08-09 DIAGNOSIS — Z95828 Presence of other vascular implants and grafts: Secondary | ICD-10-CM

## 2021-08-09 DIAGNOSIS — C50412 Malignant neoplasm of upper-outer quadrant of left female breast: Secondary | ICD-10-CM | POA: Diagnosis not present

## 2021-08-09 DIAGNOSIS — I951 Orthostatic hypotension: Secondary | ICD-10-CM

## 2021-08-09 LAB — CBC WITH DIFFERENTIAL/PLATELET
Band Neutrophils: 3 %
Basophils Absolute: 0 10*3/uL (ref 0.0–0.1)
Basophils Relative: 0 %
Eosinophils Absolute: 0.1 10*3/uL (ref 0.0–0.5)
Eosinophils Relative: 1 %
HCT: 36.3 % (ref 36.0–46.0)
Hemoglobin: 11.3 g/dL — ABNORMAL LOW (ref 12.0–15.0)
Lymphocytes Relative: 9 %
Lymphs Abs: 0.6 10*3/uL — ABNORMAL LOW (ref 0.7–4.0)
MCH: 31.6 pg (ref 26.0–34.0)
MCHC: 31.1 g/dL (ref 30.0–36.0)
MCV: 101.4 fL — ABNORMAL HIGH (ref 80.0–100.0)
Metamyelocytes Relative: 2 %
Monocytes Absolute: 0.4 10*3/uL (ref 0.1–1.0)
Monocytes Relative: 6 %
Neutro Abs: 5.1 10*3/uL (ref 1.7–7.7)
Neutrophils Relative %: 79 %
Platelets: 194 10*3/uL (ref 150–400)
RBC: 3.58 MIL/uL — ABNORMAL LOW (ref 3.87–5.11)
RDW: 12.1 % (ref 11.5–15.5)
WBC: 6.2 10*3/uL (ref 4.0–10.5)
nRBC: 0 % (ref 0.0–0.2)

## 2021-08-09 LAB — COMPREHENSIVE METABOLIC PANEL
ALT: 11 U/L (ref 0–44)
AST: 16 U/L (ref 15–41)
Albumin: 3.3 g/dL — ABNORMAL LOW (ref 3.5–5.0)
Alkaline Phosphatase: 73 U/L (ref 38–126)
Anion gap: 9 (ref 5–15)
BUN: 15 mg/dL (ref 8–23)
CO2: 31 mmol/L (ref 22–32)
Calcium: 8.2 mg/dL — ABNORMAL LOW (ref 8.9–10.3)
Chloride: 94 mmol/L — ABNORMAL LOW (ref 98–111)
Creatinine, Ser: 0.6 mg/dL (ref 0.44–1.00)
GFR, Estimated: >60 mL/min (ref >60)
Glucose, Bld: 135 mg/dL — ABNORMAL HIGH (ref 70–99)
Potassium: 4.1 mmol/L (ref 3.5–5.1)
Sodium: 134 mmol/L — ABNORMAL LOW (ref 135–145)
Total Bilirubin: 0.6 mg/dL (ref 0.3–1.2)
Total Protein: 6.3 g/dL — ABNORMAL LOW (ref 6.5–8.1)

## 2021-08-09 LAB — MAGNESIUM: Magnesium: 1.5 mg/dL — ABNORMAL LOW (ref 1.7–2.4)

## 2021-08-09 MED ORDER — MAGNESIUM SULFATE 2 GM/50ML IV SOLN
2.0000 g | Freq: Once | INTRAVENOUS | Status: AC
  Administered 2021-08-09: 2 g via INTRAVENOUS
  Filled 2021-08-09: qty 50

## 2021-08-09 MED ORDER — SODIUM CHLORIDE 0.9% FLUSH
10.0000 mL | Freq: Once | INTRAVENOUS | Status: AC | PRN
  Administered 2021-08-09: 10 mL

## 2021-08-09 MED ORDER — PEGFILGRASTIM-BMEZ 6 MG/0.6ML ~~LOC~~ SOSY
6.0000 mg | PREFILLED_SYRINGE | Freq: Once | SUBCUTANEOUS | Status: AC
  Administered 2021-08-09: 6 mg via SUBCUTANEOUS
  Filled 2021-08-09: qty 0.6

## 2021-08-09 MED ORDER — HEPARIN SOD (PORK) LOCK FLUSH 100 UNIT/ML IV SOLN
500.0000 [IU] | Freq: Once | INTRAVENOUS | Status: AC | PRN
  Administered 2021-08-09: 500 [IU]

## 2021-08-09 MED ORDER — SODIUM CHLORIDE 0.9 % IV SOLN: INTRAVENOUS | Status: DC

## 2021-08-09 MED ORDER — POTASSIUM CHLORIDE IN NACL 20-0.9 MEQ/L-% IV SOLN
Freq: Once | INTRAVENOUS | Status: AC
  Filled 2021-08-09: qty 1000

## 2021-08-09 NOTE — Patient Instructions (Signed)
[facility]  Discharge Instructions: ?Thank you for choosing [facility] to provide your oncology and hematology care.  ?If you have a lab appointment with the [facility], please come in thru the Main Entrance and check in at the main information desk. ? ?Wear comfortable clothing and clothing appropriate for easy access to any Portacath or PICC line.  ? ?We strive to give you quality time with your provider. You may need to reschedule your appointment if you arrive late (15 or more minutes).  Arriving late affects you and other patients whose appointments are after yours.  Also, if you miss three or more appointments without notifying the office, you may be dismissed from the clinic at the provider?s discretion.    ?  ?For prescription refill requests, have your pharmacy contact our office and allow 72 hours for refills to be completed.   ? ?Today you received the following House fluids and ziextenzo injection, return as scheduled. ?  ?To help prevent nausea and vomiting after your treatment, we encourage you to take your nausea medication as directed. ? ?BELOW ARE SYMPTOMS THAT SHOULD BE REPORTED IMMEDIATELY: ?*FEVER GREATER THAN 100.4 F (38 ?C) OR HIGHER ?*CHILLS OR SWEATING ?*NAUSEA AND VOMITING THAT IS NOT CONTROLLED WITH YOUR NAUSEA MEDICATION ?*UNUSUAL SHORTNESS OF BREATH ?*UNUSUAL BRUISING OR BLEEDING ?*URINARY PROBLEMS (pain or burning when urinating, or frequent urination) ?*BOWEL PROBLEMS (unusual diarrhea, constipation, pain near the anus) ?TENDERNESS IN MOUTH AND THROAT WITH OR WITHOUT PRESENCE OF ULCERS (sore throat, sores in mouth, or a toothache) ?UNUSUAL RASH, SWELLING OR PAIN  ?UNUSUAL VAGINAL DISCHARGE OR ITCHING  ? ?Items with * indicate a potential emergency and should be followed up as soon as possible or go to the Emergency Department if any problems should occur. ? ?Please show the CHEMOTHERAPY ALERT CARD or IMMUNOTHERAPY ALERT CARD at check-in to the Emergency  Department and triage nurse. ? ?Should you have questions after your visit or need to cancel or reschedule your appointment, please contact [facility] [phone]  and follow the prompts.  Office hours are 8:00 a.m. to 4:30 p.m. Monday - Friday. Please note that voicemails left after 4:00 p.m. may not be returned until the following business day.  We are closed weekends and major holidays. You have access to a nurse at all times for urgent questions. Please call the main number to the clinic [phone] and follow the prompts. ? ?For any non-urgent questions, you may also contact your provider using MyChart. We now offer e-Visits for anyone 12 and older to request care online for non-urgent symptoms. For details visit [email or web address]. ?  ?Also download the MyChart app! Go to the app store, search "MyChart", open the app, select [facility], and log in with your MyChart username and password. ? ?Due to Covid, a mask is required upon entering the hospital/clinic. If you do not have a mask, one will be given to you upon arrival. For doctor visits, patients may have 1 support person aged 22 or older with them. For treatment visits, patients cannot have anyone with them due to current Covid guidelines and our immunocompromised population.  ?

## 2021-08-09 NOTE — Progress Notes (Signed)
Patient presents today for injection, patient reports feeling dizzy and lightheaded, has experienced diarrhea, BP on arrival 74/52. Dr. Delton Coombes made aware and received orders for house fluids and lab draw. Labs drawn and patient reclined back in chair. Repeat BP 118/75. Fluids started at 0916 and call bell within reach of patient. Patient tolerated house fluids with no complaints voiced, BP after infusion 95/49 Dr. Delton Coombes made aware and gave verbal orders for 513m bolus. Patient tolerated therapy with no complaints voiced. BP after infusion 128/65. Side effects with management reviewed with understanding verbalized. Port site clean and dry with no bruising or swelling noted at site. Good blood return noted before and after administration of therapy. Band aid applied.  ?Patient tolerated injection with no complaints voiced. Site clean and dry with no bruising or swelling noted at site. See MAR for details. Band aid applied.  Patient stable during and after injection. VSS with discharge and left in satisfactory condition with no s/s of distress noted, patient and friend aware of next appointment.  ?

## 2021-08-11 NOTE — Progress Notes (Signed)
[facility] ?[address] ?[place], [place] [address] ?Phone: [phone] ?Fax: [phone] ? ?SYMPTOMS MANAGEMENT CLINIC PROGRESS NOTE  ? ?Holly Phillips ?[id number] ?June 24, 1955 ?66 y.o. ? ?Holly Phillips is managed by Dr. Delton Coombes for stage I left-sided breast cancer. ? ?Actively treated with chemotherapy/immunotherapy/hormonal therapy: YES ? ?Current therapy: TC (docetaxel/cyclophosphamide) q. 21 days ? ?Last treated: 08/05/2021 ? ?Next scheduled appointment with provider: 08/26/2021 ? ?Subjective:  ?Chief Complaint: Chemotherapy follow-up ? ?Holly Phillips is managed by Dr. Delton Coombes for stage I left-sided breast cancer.  She received her first treatment with docetaxel and cyclophosphamide on 08/05/2021. ? ?She had an ED visit on 08/07/2021 due to an episode of lightheadedness and dizziness that resulted in a fall at home, but he eloped from ED prior to completion of evaluation.  She received IV fluids at the [facility] on 08/09/2021.  Her blood pressure on arrival was 74/52, but improved to 128/65 after fluid infusion. ? ?Patient reports significant diarrhea, states that she is having 5-6 loose watery stools daily.  She has not taken anything for her diarrhea, and states that she "thought it needed to run its course."  She reports associated cramping abdominal pain.  She has had nausea without vomiting. ? ?She reports extreme fatigue.  She has continued to have some lightheadedness with standing, but has not had any falls after her ED visit on 08/07/2021.  She has had poor oral intake and no appetite, reports that "nothing tastes right."  She has been eating less than 25% of her usual intake.  However, her weight has been stable.  She reports that she drinks "plenty of fluids at home," estimated to be about 64 ounces daily. ? ?She has had burning dysuria for the past 2 to 3 days associated with urgency and frequency.  She denies any fever, chills, or flank pain. ? ?Her  chronic neuropathy has been somewhat worsened after chemotherapy.  She reports worsening numbness and tingling in her hands and feet, for which she is taking gabapentin, which helps. ? ?She also reports worsening of her chronic low back pain after chemotherapy. ?She has been having dull headaches without any focal neurologic deficits. ? ?She denies any mouth sores. ?She has not had any rash or skin changes. ?She denies any peripheral edema. ?She has chronic cough and shortness of breath from her COPD, but denies any new chest pain or changes in her breathing status. ?She has easy bruising but denies any bleeding. ?She denies any fever, chills, night sweats, unintentional weight loss. ? ?Review of Systems:  ?Review of Systems  ?Constitutional:  Positive for activity change, appetite change and fatigue. Negative for chills, diaphoresis, fever and unexpected weight change.  ?HENT:  Positive for trouble swallowing. Negative for mouth sores, nosebleeds and sore throat.   ?Respiratory:  Positive for shortness of breath. Negative for cough.   ?Cardiovascular:  Negative for chest pain, palpitations and leg swelling.  ?Gastrointestinal:  Positive for constipation, diarrhea and nausea. Negative for abdominal pain, blood in stool and vomiting.  ?Genitourinary:  Positive for dysuria, frequency and urgency. Negative for hematuria.  ?Musculoskeletal:  Positive for back pain.  ?Neurological:  Positive for numbness and headaches. Negative for dizziness and light-headedness.  ?Psychiatric/Behavioral:  Positive for dysphoric mood. Negative for sleep disturbance. The patient is nervous/anxious.   ? ? ?Past Medical History, Surgical history, Social history, and Family history were reviewed as documented elsewhere in chart, and were updated as appropriate.  ? ?Assessment & Plan:    ?  1.  Stage I (T1b N0 M0) left-sided breast cancer ?- Left lumpectomy and SLNB on 05/31/2021 ?- Receiving adjuvant chemotherapy followed by XRT plus AI due to  Oncotype DX result of 60 ?- Primary oncologist is Dr. Delton Coombes ?- Received her first treatment with docetaxel and cyclophosphamide on 08/05/2021.  Received Ziextenzo on 08/09/2021. ?- CBC today (08/12/2021): Moderate neutropenia with ANC 0.5/WBC 2.7, Hgb 10.0/MCV 101.0, platelets 233 ?- CMP (08/12/2021): Mild hypokalemia with sodium 134, mild hypoalbuminemia 3.0.  Normal LFTs and kidney function.  Normal potassium.  Hypomagnesemia 1.3. ?- PLAN: 2 g IV magnesium given during visit. ?- I will see her back for symptom management visit next week with repeat labs and possible IV fluids  ? ?2.  Dehydration with symptomatic hypotension ?- Reports that she drinks "plenty of fluids at home," but she is having significant fluid loss with diarrhea ?- She has been having weakness and lightheadedness with standing, which caused her to fall at home and present to ED on 08/07/2021 ?-  She received [facility] at the [facility] on 08/09/2021.  Her blood pressure on arrival was 74/52, but improved to 128/65 after fluid infusion. ?- Lightheadedness improved after IV fluids.  Has not fallen since ED visit. ?- Dehydration today evidenced by marginal blood pressure 106/57 and heart rate 98 ?- PLAN: 1 L NS given today.  Encouraged to drink more than 64 ounces of fluid daily.  RTC next week for possible repeat IV fluids. ? ?3.  Diarrhea and nausea ?- Having 5-6 loose watery stools daily. ?- Has not taken anything for her diarrhea, and states that she "thought it needed to run its course." ?- She reports associated cramping abdominal pain.  She has had nausea without vomiting. ?- PLAN: Prescription sent for Imodium with instructions to take 2 capsules immediately after her first loose bowel movement and to take 1 capsule after each subsequent loose bowel movement. ?- She already has prescription for Zofran, but has not been using it.  Educated that she can take 1 tablet every 8 hours as needed for nausea. ? ?4.  Urinary tract infection ?-  Dysuria, urgency, and frequency for the past 2 to 3 days. ?- No fever, chills, flank pain ?- Urinalysis highly suspicious for urinary tract infection, reflex to urine culture pending ?- PLAN: Macrobid 100 mg daily x7 days.  We will follow urine culture results. ? ?5.  Neuropathy ?- Chronic neuropathy somewhat worsened after chemotherapy ?- PLAN: Continue gabapentin as prescribed. ? ?6.  Nutrition ?- She has had poor oral intake and no appetite, reports that "nothing tastes right."  She has been eating less than 25% of her usual intake.  However, her weight has been stable.  ?- PLAN: Recommended small, more frequent meals. ?- Recommend supplements such as Boost or Ensure daily ?- Referral sent to nutritionist ? ? ?PLAN SUMMARY & DISPOSITION: ?Symptom management follow-up visit in 1 week ?IV fluids given today ?Prescription sent for Imodium and Macrobid ? ?Objective:  ? ?Physical Exam:  ?LMP  (LMP Unknown)  ?ECOG: 2 ? ?Physical Exam ?Vitals reviewed.  ?Constitutional:   ?   Comments: Tired-appearing  ?Cardiovascular:  ?   Rate and Rhythm: Regular rhythm. Tachycardia present.  ?   Pulses: Normal pulses.  ?   Heart sounds: Normal heart sounds.  ?Pulmonary:  ?   Effort: Pulmonary effort is normal.  ?   Breath sounds: Normal breath sounds.  ?Neurological:  ?   General: No focal deficit present.  ?  Mental Status: She is alert and oriented to person, place, and time.  ?Psychiatric:     ?   Mood and Affect: Mood normal.     ?   Behavior: Behavior normal.  ? ? ?Lab Review:  ?   ?Component Value Date/Time  ? NA 134 (L) 08/09/2021 0853  ? K 4.1 08/09/2021 0853  ? CL 94 (L) 08/09/2021 0853  ? CO2 31 08/09/2021 0853  ? GLUCOSE 135 (H) 08/09/2021 0853  ? BUN 15 08/09/2021 0853  ? CREATININE 0.60 08/09/2021 0853  ? CALCIUM 8.2 (L) 08/09/2021 0853  ? PROT 6.3 (L) 08/09/2021 0853  ? ALBUMIN 3.3 (L) 08/09/2021 0853  ? AST 16 08/09/2021 0853  ? ALT 11 08/09/2021 0853  ? ALKPHOS 73 08/09/2021 0853  ? BILITOT 0.6 08/09/2021 0853  ?  GFRNONAA >60 08/09/2021 0853  ? GFRAA >60 01/18/2020 0934  ? ? ?   ?Component Value Date/Time  ? WBC 6.2 08/09/2021 0853  ? RBC 3.58 (L) 08/09/2021 0853  ? HGB 11.3 (L) 08/09/2021 0853  ? HCT 36.3 03/24/202

## 2021-08-12 ENCOUNTER — Other Ambulatory Visit (HOSPITAL_COMMUNITY): Payer: Self-pay | Admitting: *Deleted

## 2021-08-12 ENCOUNTER — Ambulatory Visit (HOSPITAL_COMMUNITY): Payer: Medicare Other | Admitting: Dietician

## 2021-08-12 ENCOUNTER — Other Ambulatory Visit (HOSPITAL_COMMUNITY): Payer: Self-pay | Admitting: Physician Assistant

## 2021-08-12 ENCOUNTER — Inpatient Hospital Stay (HOSPITAL_COMMUNITY): Payer: Medicare Other

## 2021-08-12 ENCOUNTER — Inpatient Hospital Stay (HOSPITAL_BASED_OUTPATIENT_CLINIC_OR_DEPARTMENT_OTHER): Payer: Medicare Other | Admitting: Physician Assistant

## 2021-08-12 ENCOUNTER — Other Ambulatory Visit: Payer: Self-pay

## 2021-08-12 VITALS — BP 106/57 | HR 98 | Temp 97.5°F | Resp 18 | Ht 60.43 in | Wt 152.0 lb

## 2021-08-12 VITALS — BP 99/65 | Temp 97.5°F | Resp 16

## 2021-08-12 DIAGNOSIS — K521 Toxic gastroenteritis and colitis: Secondary | ICD-10-CM

## 2021-08-12 DIAGNOSIS — R300 Dysuria: Secondary | ICD-10-CM

## 2021-08-12 DIAGNOSIS — Z09 Encounter for follow-up examination after completed treatment for conditions other than malignant neoplasm: Secondary | ICD-10-CM

## 2021-08-12 DIAGNOSIS — T451X5A Adverse effect of antineoplastic and immunosuppressive drugs, initial encounter: Secondary | ICD-10-CM

## 2021-08-12 DIAGNOSIS — Z170 Estrogen receptor positive status [ER+]: Secondary | ICD-10-CM

## 2021-08-12 DIAGNOSIS — N3000 Acute cystitis without hematuria: Secondary | ICD-10-CM | POA: Diagnosis not present

## 2021-08-12 DIAGNOSIS — R350 Frequency of micturition: Secondary | ICD-10-CM

## 2021-08-12 DIAGNOSIS — C50412 Malignant neoplasm of upper-outer quadrant of left female breast: Secondary | ICD-10-CM

## 2021-08-12 DIAGNOSIS — R790 Abnormal level of blood mineral: Secondary | ICD-10-CM

## 2021-08-12 LAB — CBC WITH DIFFERENTIAL/PLATELET
Basophils Absolute: 0 10*3/uL (ref 0.0–0.1)
Basophils Relative: 1 %
Blasts: 2 %
Eosinophils Absolute: 0.1 10*3/uL (ref 0.0–0.5)
Eosinophils Relative: 4 %
HCT: 31.5 % — ABNORMAL LOW (ref 36.0–46.0)
Hemoglobin: 10 g/dL — ABNORMAL LOW (ref 12.0–15.0)
Lymphocytes Relative: 52 %
Lymphs Abs: 1.4 10*3/uL (ref 0.7–4.0)
MCH: 32.1 pg (ref 26.0–34.0)
MCHC: 31.7 g/dL (ref 30.0–36.0)
MCV: 101 fL — ABNORMAL HIGH (ref 80.0–100.0)
Monocytes Absolute: 0.6 10*3/uL (ref 0.1–1.0)
Monocytes Relative: 24 %
Neutro Abs: 0.5 10*3/uL — ABNORMAL LOW (ref 1.7–7.7)
Neutrophils Relative %: 17 %
Platelets: 233 10*3/uL (ref 150–400)
RBC: 3.12 MIL/uL — ABNORMAL LOW (ref 3.87–5.11)
RDW: 12 % (ref 11.5–15.5)
WBC: 2.7 10*3/uL — ABNORMAL LOW (ref 4.0–10.5)
nRBC: 10.8 % — ABNORMAL HIGH (ref 0.0–0.2)
nRBC: 17 /100 WBC — ABNORMAL HIGH

## 2021-08-12 LAB — COMPREHENSIVE METABOLIC PANEL
ALT: 10 U/L (ref 0–44)
AST: 13 U/L — ABNORMAL LOW (ref 15–41)
Albumin: 3 g/dL — ABNORMAL LOW (ref 3.5–5.0)
Alkaline Phosphatase: 69 U/L (ref 38–126)
Anion gap: 7 (ref 5–15)
BUN: 8 mg/dL (ref 8–23)
CO2: 31 mmol/L (ref 22–32)
Calcium: 8.2 mg/dL — ABNORMAL LOW (ref 8.9–10.3)
Chloride: 96 mmol/L — ABNORMAL LOW (ref 98–111)
Creatinine, Ser: 0.69 mg/dL (ref 0.44–1.00)
GFR, Estimated: >60 mL/min (ref >60)
Glucose, Bld: 116 mg/dL — ABNORMAL HIGH (ref 70–99)
Potassium: 3.7 mmol/L (ref 3.5–5.1)
Sodium: 134 mmol/L — ABNORMAL LOW (ref 135–145)
Total Bilirubin: 0.6 mg/dL (ref 0.3–1.2)
Total Protein: 5.9 g/dL — ABNORMAL LOW (ref 6.5–8.1)

## 2021-08-12 LAB — URINALYSIS, ROUTINE W REFLEX MICROSCOPIC
Glucose, UA: NEGATIVE mg/dL
Ketones, ur: 5 mg/dL — AB
Nitrite: POSITIVE — AB
Protein, ur: >=300 mg/dL — AB
Specific Gravity, Urine: 1.022 (ref 1.005–1.030)
WBC, UA: >50 WBC/hpf — ABNORMAL HIGH (ref 0–5)
pH: 5 (ref 5.0–8.0)

## 2021-08-12 LAB — MAGNESIUM: Magnesium: 1.3 mg/dL — ABNORMAL LOW (ref 1.7–2.4)

## 2021-08-12 MED ORDER — LOPERAMIDE HCL 2 MG PO CAPS: 2.0000 mg | ORAL_CAPSULE | ORAL | 0 refills | Status: DC | PRN

## 2021-08-12 MED ORDER — SODIUM CHLORIDE 0.9 % IV SOLN: Freq: Once | INTRAVENOUS | Status: AC

## 2021-08-12 MED ORDER — NITROFURANTOIN MONOHYD MACRO 100 MG PO CAPS: 100.0000 mg | ORAL_CAPSULE | Freq: Two times a day (BID) | ORAL | 0 refills | Status: DC

## 2021-08-12 MED ORDER — MAGNESIUM SULFATE 2 GM/50ML IV SOLN
2.0000 g | Freq: Once | INTRAVENOUS | Status: AC
  Administered 2021-08-12: 2 g via INTRAVENOUS
  Filled 2021-08-12: qty 50

## 2021-08-12 MED ORDER — SODIUM CHLORIDE 0.9% FLUSH
10.0000 mL | Freq: Once | INTRAVENOUS | Status: AC
  Administered 2021-08-12: 10 mL via INTRAVENOUS

## 2021-08-12 MED ORDER — HEPARIN SOD (PORK) LOCK FLUSH 100 UNIT/ML IV SOLN
500.0000 [IU] | Freq: Once | INTRAVENOUS | Status: AC
  Administered 2021-08-12: 500 [IU] via INTRAVENOUS

## 2021-08-12 NOTE — Progress Notes (Signed)
To treatment room for oncology follow up with possible hydration.  Patient stated burning with urination over the weekend.  Urine sample collected.   Stated decreased appetite but no changes in taste buds.  Resting with no s/s of distress noted.   ? ?Magnesium 1.3 with verbal order Hydration Normal Saline 1000 ml over 2 hours and Magnesium 2 grams verbal order Tarri Abernethy, PA.  Pharmacy aware.   ? ?Patient tolerated hydration with no complaints voiced.  Port site clean and dry with good blood return noted before and after hydration.  No bruising or swelling noted with port.  Band aid applied.  VSS with discharge and left ambulatory with no s/s of distress noted.    ?

## 2021-08-12 NOTE — Patient Instructions (Signed)
[facility]  Discharge Instructions: Thank you for choosing [facility] to provide your oncology and hematology care.  If you have a lab appointment with the Cancer Center, please come in thru the Main Entrance and check in at the main information desk.  Wear comfortable clothing and clothing appropriate for easy access to any Portacath or PICC line.   We strive to give you quality time with your provider. You may need to reschedule your appointment if you arrive late (15 or more minutes).  Arriving late affects you and other patients whose appointments are after yours.  Also, if you miss three or more appointments without notifying the office, you may be dismissed from the clinic at the provider's discretion.      For prescription refill requests, have your pharmacy contact our office and allow 72 hours for refills to be completed.        To help prevent nausea and vomiting after your treatment, we encourage you to take your nausea medication as directed.  BELOW ARE SYMPTOMS THAT SHOULD BE REPORTED IMMEDIATELY: *FEVER GREATER THAN 100.4 F (38 C) OR HIGHER *CHILLS OR SWEATING *NAUSEA AND VOMITING THAT IS NOT CONTROLLED WITH YOUR NAUSEA MEDICATION *UNUSUAL SHORTNESS OF BREATH *UNUSUAL BRUISING OR BLEEDING *URINARY PROBLEMS (pain or burning when urinating, or frequent urination) *BOWEL PROBLEMS (unusual diarrhea, constipation, pain near the anus) TENDERNESS IN MOUTH AND THROAT WITH OR WITHOUT PRESENCE OF ULCERS (sore throat, sores in mouth, or a toothache) UNUSUAL RASH, SWELLING OR PAIN  UNUSUAL VAGINAL DISCHARGE OR ITCHING   Items with * indicate a potential emergency and should be followed up as soon as possible or go to the Emergency Department if any problems should occur.  Please show the CHEMOTHERAPY ALERT CARD or IMMUNOTHERAPY ALERT CARD at check-in to the Emergency Department and triage nurse.  Should you have questions after your visit or need to cancel  or reschedule your appointment, please contact [facility] [phone]  and follow the prompts.  Office hours are 8:00 a.m. to 4:30 p.m. Monday - Friday. Please note that voicemails left after 4:00 p.m. may not be returned until the following business day.  We are closed weekends and major holidays. You have access to a nurse at all times for urgent questions. Please call the main number to the clinic [phone] and follow the prompts.  For any non-urgent questions, you may also contact your provider using MyChart. We now offer e-Visits for anyone 18 and older to request care online for non-urgent symptoms. For details visit [email or web address].   Also download the MyChart app! Go to the app store, search "MyChart", open the app, select [facility], and log in with your MyChart username and password.  Due to Covid, a mask is required upon entering the hospital/clinic. If you do not have a mask, one will be given to you upon arrival. For doctor visits, patients may have 1 support person aged 18 or older with them. For treatment visits, patients cannot have anyone with them due to current Covid guidelines and our immunocompromised population.  

## 2021-08-12 NOTE — Patient Instructions (Addendum)
[place] [facility] ?Discharge Instructions ? ?You were seen today by Tarri Abernethy PA-C for your chemotherapy follow-up and symptom management visit.   ? ?DEHYDRATION: Your blood pressures have been somewhat low, which is likely from dehydration.  I suspect that this is what has been causing you to feel lightheaded and fall at home.  We gave you IV fluids today.  Make sure that you are drinking at least 80 ounces of water (2.5 L) daily. ? ?DIARRHEA: This is a common side effect of chemotherapy.  Your diarrhea is making your dehydration worse.  Prescription has been sent for Imodium.  Take 2 capsules of Imodium immediately after your first loose bowel movement and take 1 capsule after each subsequent loose bowel movement. ? ?NAUSEA: You have already been sent a prescription for Zofran.  You can take 1 tablet of Zofran every 8 hours as needed for nausea. ? ?URINARY TRACT INFECTION: Your urine today was positive for urinary tract infection.  Prescription has been sent for [facility] antibiotic 100 mg daily for 7 days.  Let us know immediately if you start having any fever or chills. ? ?NUTRITION: Try to eat small and more frequent meals throughout the day since you are not able to consume large meals at this time.  I recommend that you supplement your diet with nutritional shakes (such as Ensure or Boost), 1-2 shakes daily.  Referral has been sent to nutritionist as well. ? ?FOLLOW-UP APPOINTMENT: We will repeat labs, possible IV fluids, and symptom management visit next week. ? ? ?Thank you for choosing [place] at [facility] to provide your oncology and hematology care.  To afford each patient quality time with our provider, please arrive at least 15 minutes before your scheduled appointment time.  ? ?If you have a lab appointment with the [facility] please come in thru the Main Entrance and check in at the main information desk. ? ?You need to re-schedule  your appointment should you arrive 10 or more minutes late.  We strive to give you quality time with our providers, and arriving late affects you and other patients whose appointments are after yours.  Also, if you no show three or more times for appointments you may be dismissed from the clinic at the providers discretion.     ?Again, thank you for choosing [facility].  Our hope is that these requests will decrease the amount of time that you wait before being seen by our physicians.       ?_____________________________________________________________ ? ?Should you have questions after your visit to [facility], please contact our office at [phone] and follow the prompts.  Our office hours are 8:00 a.m. and 4:30 p.m. Monday - Friday.  Please note that voicemails left after 4:00 p.m. may not be returned until the following business day.  We are closed weekends and major holidays.  You do have access to a nurse 24-7, just call the main number to the clinic [phone] and do not press any options, hold on the line and a nurse will answer the phone.   ? ?For prescription refill requests, have your pharmacy contact our office and allow 72 hours.   ? ?Due to Covid, you will need to wear a mask upon entering the hospital. If you do not have a mask, a mask will be given to you at the Main Entrance upon arrival. For doctor visits, patients may have 1  support person age 64 or older with them. For treatment visits, patients can not have anyone with them due to social distancing guidelines and our immunocompromised population.  ? ? ? ?

## 2021-08-12 NOTE — Progress Notes (Signed)
Nutrition Assessment ? ? ?Reason for Assessment: poor appetite ? ? 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 She is receiving chemotherapy with taxotere + cytoxan q21d. Patient is under the care of Dr. Katragadda.  ? ?Past medical history includes anxiety, depression, COPD, emphysema, GERD, sleep apnea, TIA ? ?Received secure chat from PA-C with request to see patient secondary to poor appetite. Met with patient during infusion. She is drowsy this afternoon, nodding off multiple times during visit. Patient reports poor appetite. Patient unable to provide usual intake or 24 hour recall. She is having diminished taste, reports foods are blah. Patient has not tried oral nutrition supplements. Patient denies nausea, vomiting, diarrhea, constipation.  ? ? ?Nutrition Focused Physical Exam: deferred ? ? ?Medications: Mag-ox, Imodium, Macrobid, Compazine, Zofran, Celebrex, Lexapro, Lasix, Lipitor ? ? ?Labs: Na 134, Glucose 116, Mg 1.3 ? ? ?Anthropometrics: Weight 152 lb today decreased 3 lbs in 2 weeks ? ?3/20 - 152 lb 8 oz  ?3/14 - 155 lb  ?2/13 - 157 lb  ? ? ?Height: 5' 0.43" ?Weight: 68.9 kg  ?UBW: 155 - 160 lb (per pt) ?BMI: 29.26 ? ? ? ?NUTRITION DIAGNOSIS: Inadequate oral intake related to chemotherapy side effects as evidenced by pt reported diminished taste of foods, poor appetite.  ? ? ?INTERVENTION:  ?Encouraged small frequent meals and snacks with adequate calories and protein - handout with snack ideas provided ?Discussed strategies for altered taste and importance of oral care- handout provided  ?Suggested pt try baking soda, salt water rinse several times daily before meals - recipe provided ?Suggested drinking oral nutrition supplements with decreased intake, recommend 2 Ensure Plus/equivalent - samples of Ensure Complete, CIB given for pt to try ? ? ?MONITORING, EVALUATION, GOAL: Patient will tolerate increased calories and protein to minimize  weight loss  ? ? ?Next Visit: Monday April 10 during infusion ? ? ? ? ? ? ?

## 2021-08-15 ENCOUNTER — Telehealth: Payer: Self-pay | Admitting: Physician Assistant

## 2021-08-15 NOTE — Telephone Encounter (Signed)
Patient wants a call back re gabapentin (NEURONTIN) 300 MG capsule ?Patient states sam took over all her medications. Wants a call back to further discuss.  ?

## 2021-08-16 MED ORDER — GABAPENTIN 600 MG PO TABS: ORAL_TABLET | ORAL | 1 refills | Status: DC

## 2021-08-17 NOTE — Progress Notes (Signed)
[facility] ?[address] ?[place], [place] [address] ?Phone: [phone] ?Fax: [phone] ? ?SYMPTOMS MANAGEMENT CLINIC PROGRESS NOTE  ? ?Holly Phillips ?[id number] ?[birth year] ?66 y.o. ? ?Staphanie Harbison is managed by Dr. Delton Coombes for stage I left-sided breast cancer. ?  ?Actively treated with chemotherapy/immunotherapy/hormonal therapy: YES ?  ?Current therapy: TC (docetaxel/cyclophosphamide) q. 21 days ?  ?Last treated: 08/05/2021 ?  ?Next scheduled appointment with provider: 08/26/2021 ? ?Subjective:  ?Chief Complaint: Chemotherapy follow-up ? ?Holly Phillips is managed by Dr. Delton Coombes for stage I left-sided breast cancer.  She had her first treatment with docetaxel and cyclophosphamide on 08/05/2021.  She was seen for symptom management visit by Tarri Abernethy PA-C on 08/12/2021 and was noted to be experiencing significant diarrhea, dehydration, urinary tract infection, and other issues noted below during that visit.  She returns today for symptom recheck and possible IV fluids if needed. ? ?She reports that she feels somewhat better than she did last week, but still remains fatigued and tired, reports that she is sleeping a lot. ?She has not had any more episodes of lightheadedness, falls, or low blood pressure. ? ?She continues to have poor appetite which she attributes to loss of taste.  She has not been drinking Ensure or Boost as recommended.  Her weight is down 3 pounds in the past week. ? ?Her diarrhea has resolved on its own.  She had 2 bowel movements yesterday described as firm but not hard.  Nausea has improved, is well managed on Zofran.  She denies any vomiting. ? ?She completed antibiotics for her UTI, but continues to have some suprapubic pressure and cramping.  She reports urinary urgency but denies any burning with urination. ? ?Her neuropathy and back pain are chronic and stable. ? ?She denies any mouth sores. ?She has not had any rash or skin  changes. ?She denies any peripheral edema. ?She has chronic cough and shortness of breath from her COPD, but denies any new chest pain or changes in her breathing status. ?She has easy bruising but denies any bleeding. ?She denies any fever, chills, night sweats, unintentional weight loss. ? ? ?Review of Systems:  ?Review of Systems  ?Constitutional:  Positive for activity change, appetite change, fatigue and unexpected weight change. Negative for chills, diaphoresis and fever.  ?HENT:  Positive for trouble swallowing. Negative for mouth sores, nosebleeds and sore throat.   ?Respiratory:  Positive for cough and shortness of breath.   ?Cardiovascular:  Negative for chest pain, palpitations and leg swelling.  ?Gastrointestinal:  Negative for abdominal pain, blood in stool, constipation, diarrhea, nausea and vomiting.  ?Genitourinary:  Negative for dysuria and hematuria.  ?Musculoskeletal:  Positive for back pain.  ?Neurological:  Positive for numbness. Negative for dizziness, light-headedness and headaches.  ?Psychiatric/Behavioral:  Negative for dysphoric mood and sleep disturbance. The patient is not nervous/anxious.   ? ? ?Past Medical History, Surgical history, Social history, and Family history were reviewed as documented elsewhere in chart, and were updated as appropriate.  ? ?Assessment & Plan:    ?1.  Stage I (T1b N0 M0) left-sided breast cancer ?- Left lumpectomy and SLNB on 05/31/2021 ?- Receiving adjuvant chemotherapy followed by XRT plus AI due to Oncotype DX result of 60 ?- Primary oncologist is Dr. Delton Coombes ?- Received her first treatment with docetaxel and cyclophosphamide on 08/05/2021.  Received Ziextenzo on 08/09/2021. ?- PLAN: Scheduled for labs, MD visit, next cycle of chemotherapy in 1 week (08/26/2021) ?  ?2.  Electrolyte disturbance ?-  Labs today (08/19/2021) significant for magnesium 1.2 and potassium 3.0 ?- Patient states that she is taking her magnesium supplement as prescribed 3 times daily ?-  PLAN: Magnesium repletion given with 4 g IV magnesium ?- Potassium repletion given with 20 mEq IV potassium + 40 mEq oral potassium ?  ?3.  Diarrhea and nausea ?- Diarrhea has resolved.  Prescription for Imodium was sent, but patient never took it. ?- Mild nausea is being managed with Zofran ?- PLAN: Patient reminded of her Imodium prescription if she has any recurrent diarrhea.  Continue Zofran for nausea. ?  ?4.  Urinary tract infection ?- Urinalysis from 08/12/2021 positive for UTI ?- Completed treatment with Macrobid 100 mg twice daily x7 days ?- Complains of persistent suprapubic cramping, pressure, and urinary urgency ?- PLAN: We will check repeat UA today and sent for culture due to persistent symptomatology. ?  ?5.  Neuropathy ?- Chronic neuropathy is stable ?- PLAN: Continue gabapentin as prescribed. ?  ?6.  Nutrition and weight loss ?- She has had poor oral intake and no appetite, reports that "nothing tastes right."  She has been eating about 50% of her usual intake. ?- She has lost 3 pounds in the past week ?- She saw nutritionist last week, who recommended Ensure or Boost twice daily, but she has not followed through with this ?- PLAN: Recommended small, more frequent meals. ?- Again recommended supplements such as Boost or Ensure twice daily ? ? ?Objective:  ? ?Physical Exam:  ?ECOG: 2 ? ?Physical Exam ?Vitals reviewed.  ?Constitutional:   ?   Comments: Tired-appearing  ?Cardiovascular:  ?   Rate and Rhythm: Regular rhythm. Tachycardia present.  ?   Pulses: Normal pulses.  ?   Heart sounds: Normal heart sounds.  ?   Comments: HR 108 ?Pulmonary:  ?   Effort: Pulmonary effort is normal.  ?   Breath sounds: Decreased air movement present.  ?Neurological:  ?   General: No focal deficit present.  ?   Mental Status: She is alert and oriented to person, place, and time.  ?Psychiatric:     ?   Mood and Affect: Mood normal.     ?   Behavior: Behavior normal.  ? ? ?Lab Review:  ?   ?Component Value Date/Time  ?  NA 144 08/19/2021 1002  ? K 3.0 (L) 08/19/2021 1002  ? CL 102 08/19/2021 1002  ? CO2 33 (H) 08/19/2021 1002  ? GLUCOSE 115 (H) 08/19/2021 1002  ? BUN 9 08/19/2021 1002  ? CREATININE 0.72 08/19/2021 1002  ? CALCIUM 8.3 (L) 08/19/2021 1002  ? PROT 5.9 (L) 08/19/2021 1002  ? ALBUMIN 2.9 (L) 08/19/2021 1002  ? AST 17 08/19/2021 1002  ? ALT 9 08/19/2021 1002  ? ALKPHOS 124 08/19/2021 1002  ? BILITOT 0.2 (L) 08/19/2021 1002  ? GFRNONAA >60 08/19/2021 1002  ? GFRAA >60 01/18/2020 0934  ? ? ?   ?Component Value Date/Time  ? WBC 26.3 (H) 08/19/2021 1002  ? RBC 3.74 (L) 08/19/2021 1002  ? HGB 11.2 (L) 08/19/2021 1002  ? HCT 38.0 08/19/2021 1002  ? PLT 213 08/19/2021 1002  ? MCV 101.6 (H) 08/19/2021 1002  ? MCH 29.9 08/19/2021 1002  ? MCHC 29.5 (L) 08/19/2021 1002  ? RDW 14.0 08/19/2021 1002  ? LYMPHSABS 5.8 (H) 08/19/2021 1002  ? MONOABS 1.6 (H) 08/19/2021 1002  ? EOSABS 0.0 08/19/2021 1002  ? BASOSABS 0.0 08/19/2021 1002  ? ?------------------------------- ? ?Imaging from last 24 hours (if  applicable): ? ?Radiology interpretation: ?CT Head Wo Contrast ? ?Result Date: 08/07/2021 ?CLINICAL DATA:  Moderate to severe head trauma. Headache and dizziness. History breast cancer EXAM: CT HEAD WITHOUT CONTRAST TECHNIQUE: Contiguous axial images were obtained from the base of the skull through the vertex without intravenous contrast. RADIATION DOSE REDUCTION: This exam was performed according to the departmental dose-optimization program which includes automated exposure control, adjustment of the mA and/or kV according to patient size and/or use of iterative reconstruction technique. COMPARISON:  CT head 01/04/2021 FINDINGS: Brain: No evidence of acute infarction, hemorrhage, hydrocephalus, extra-axial collection or mass lesion/mass effect. Vascular: Negative for hyperdense vessel Skull: Negative Sinuses/Orbits: Paranasal sinuses clear. Mastoid air cells incompletely developed. Negative orbit Other: None IMPRESSION: Negative CT head  Electronically Signed   By: Franchot Gallo M.D.   On: 08/07/2021 11:52  ? ?MR BRAIN WO CONTRAST ? ?Result Date: 08/07/2021 ?CLINICAL DATA:  Dizziness and headache for a couple of days. Unable to hold objects

## 2021-08-19 ENCOUNTER — Inpatient Hospital Stay (HOSPITAL_COMMUNITY): Payer: Medicare Other | Attending: Hematology | Admitting: Physician Assistant

## 2021-08-19 ENCOUNTER — Inpatient Hospital Stay (HOSPITAL_COMMUNITY): Payer: Medicare Other

## 2021-08-19 ENCOUNTER — Encounter (HOSPITAL_COMMUNITY): Payer: Self-pay | Admitting: Physician Assistant

## 2021-08-19 VITALS — HR 99

## 2021-08-19 VITALS — BP 126/63 | HR 85 | Temp 98.0°F | Resp 18

## 2021-08-19 DIAGNOSIS — G629 Polyneuropathy, unspecified: Secondary | ICD-10-CM | POA: Diagnosis not present

## 2021-08-19 DIAGNOSIS — N390 Urinary tract infection, site not specified: Secondary | ICD-10-CM | POA: Diagnosis not present

## 2021-08-19 DIAGNOSIS — R350 Frequency of micturition: Secondary | ICD-10-CM

## 2021-08-19 DIAGNOSIS — E876 Hypokalemia: Secondary | ICD-10-CM | POA: Insufficient documentation

## 2021-08-19 DIAGNOSIS — C50412 Malignant neoplasm of upper-outer quadrant of left female breast: Secondary | ICD-10-CM | POA: Diagnosis present

## 2021-08-19 DIAGNOSIS — E860 Dehydration: Secondary | ICD-10-CM | POA: Diagnosis not present

## 2021-08-19 DIAGNOSIS — Z170 Estrogen receptor positive status [ER+]: Secondary | ICD-10-CM

## 2021-08-19 DIAGNOSIS — Z09 Encounter for follow-up examination after completed treatment for conditions other than malignant neoplasm: Secondary | ICD-10-CM

## 2021-08-19 LAB — COMPREHENSIVE METABOLIC PANEL
ALT: 9 U/L (ref 0–44)
AST: 17 U/L (ref 15–41)
Albumin: 2.9 g/dL — ABNORMAL LOW (ref 3.5–5.0)
Alkaline Phosphatase: 124 U/L (ref 38–126)
Anion gap: 9 (ref 5–15)
BUN: 9 mg/dL (ref 8–23)
CO2: 33 mmol/L — ABNORMAL HIGH (ref 22–32)
Calcium: 8.3 mg/dL — ABNORMAL LOW (ref 8.9–10.3)
Chloride: 102 mmol/L (ref 98–111)
Creatinine, Ser: 0.72 mg/dL (ref 0.44–1.00)
GFR, Estimated: >60 mL/min (ref >60)
Glucose, Bld: 115 mg/dL — ABNORMAL HIGH (ref 70–99)
Potassium: 3 mmol/L — ABNORMAL LOW (ref 3.5–5.1)
Sodium: 144 mmol/L (ref 135–145)
Total Bilirubin: 0.2 mg/dL — ABNORMAL LOW (ref 0.3–1.2)
Total Protein: 5.9 g/dL — ABNORMAL LOW (ref 6.5–8.1)

## 2021-08-19 LAB — URINALYSIS, ROUTINE W REFLEX MICROSCOPIC
Bilirubin Urine: NEGATIVE
Glucose, UA: NEGATIVE mg/dL
Hgb urine dipstick: NEGATIVE
Ketones, ur: NEGATIVE mg/dL
Leukocytes,Ua: NEGATIVE
Nitrite: NEGATIVE
Protein, ur: NEGATIVE mg/dL
Specific Gravity, Urine: 1.008 (ref 1.005–1.030)
pH: 6 (ref 5.0–8.0)

## 2021-08-19 LAB — CBC WITH DIFFERENTIAL/PLATELET
Band Neutrophils: 3 %
Basophils Absolute: 0 10*3/uL (ref 0.0–0.1)
Basophils Relative: 0 %
Eosinophils Absolute: 0 10*3/uL (ref 0.0–0.5)
Eosinophils Relative: 0 %
HCT: 38 % (ref 36.0–46.0)
Hemoglobin: 11.2 g/dL — ABNORMAL LOW (ref 12.0–15.0)
Lymphocytes Relative: 22 %
Lymphs Abs: 5.8 10*3/uL — ABNORMAL HIGH (ref 0.7–4.0)
MCH: 29.9 pg (ref 26.0–34.0)
MCHC: 29.5 g/dL — ABNORMAL LOW (ref 30.0–36.0)
MCV: 101.6 fL — ABNORMAL HIGH (ref 80.0–100.0)
Metamyelocytes Relative: 5 %
Monocytes Absolute: 1.6 10*3/uL — ABNORMAL HIGH (ref 0.1–1.0)
Monocytes Relative: 6 %
Neutro Abs: 17.6 10*3/uL — ABNORMAL HIGH (ref 1.7–7.7)
Neutrophils Relative %: 64 %
Platelets: 213 10*3/uL (ref 150–400)
RBC: 3.74 MIL/uL — ABNORMAL LOW (ref 3.87–5.11)
RDW: 14 % (ref 11.5–15.5)
WBC: 26.3 10*3/uL — ABNORMAL HIGH (ref 4.0–10.5)
nRBC: 2.3 % — ABNORMAL HIGH (ref 0.0–0.2)

## 2021-08-19 LAB — MAGNESIUM: Magnesium: 1.2 mg/dL — ABNORMAL LOW (ref 1.7–2.4)

## 2021-08-19 MED ORDER — POTASSIUM CHLORIDE 20 MEQ PO PACK
40.0000 meq | PACK | Freq: Once | ORAL | Status: AC
  Administered 2021-08-19: 40 meq via ORAL
  Filled 2021-08-19: qty 2

## 2021-08-19 MED ORDER — SODIUM CHLORIDE 0.9% FLUSH
10.0000 mL | Freq: Once | INTRAVENOUS | Status: AC | PRN
  Administered 2021-08-19: 10 mL

## 2021-08-19 MED ORDER — HEPARIN SOD (PORK) LOCK FLUSH 100 UNIT/ML IV SOLN
500.0000 [IU] | Freq: Once | INTRAVENOUS | Status: AC | PRN
  Administered 2021-08-19: 500 [IU]

## 2021-08-19 MED ORDER — MAGNESIUM SULFATE 2 GM/50ML IV SOLN
2.0000 g | INTRAVENOUS | Status: AC
  Administered 2021-08-19 (×2): 2 g via INTRAVENOUS
  Filled 2021-08-19 (×2): qty 50

## 2021-08-19 MED ORDER — POTASSIUM CHLORIDE IN NACL 20-0.9 MEQ/L-% IV SOLN
Freq: Once | INTRAVENOUS | Status: AC
  Filled 2021-08-19: qty 1000

## 2021-08-19 NOTE — Telephone Encounter (Signed)
Message sent thru MyChart for pt to advised them on new medication instructions ?

## 2021-08-19 NOTE — Patient Instructions (Signed)
[facility]  Discharge Instructions: ?Thank you for choosing [facility] to provide your oncology and hematology care.  ?If you have a lab appointment with the [facility], please come in thru the Main Entrance and check in at the main information desk. ? ?Wear comfortable clothing and clothing appropriate for easy access to any Portacath or PICC line.  ? ?We strive to give you quality time with your provider. You may need to reschedule your appointment if you arrive late (15 or more minutes).  Arriving late affects you and other patients whose appointments are after yours.  Also, if you miss three or more appointments without notifying the office, you may be dismissed from the clinic at the provider?s discretion.    ?  ?For prescription refill requests, have your pharmacy contact our office and allow 72 hours for refills to be completed.   ? ?Today you received the following house fluids, potassium, and magnesium. Return as scheduled. ?  ?To help prevent nausea and vomiting after your treatment, we encourage you to take your nausea medication as directed. ? ?BELOW ARE SYMPTOMS THAT SHOULD BE REPORTED IMMEDIATELY: ?*FEVER GREATER THAN 100.4 F (38 ?C) OR HIGHER ?*CHILLS OR SWEATING ?*NAUSEA AND VOMITING THAT IS NOT CONTROLLED WITH YOUR NAUSEA MEDICATION ?*UNUSUAL SHORTNESS OF BREATH ?*UNUSUAL BRUISING OR BLEEDING ?*URINARY PROBLEMS (pain or burning when urinating, or frequent urination) ?*BOWEL PROBLEMS (unusual diarrhea, constipation, pain near the anus) ?TENDERNESS IN MOUTH AND THROAT WITH OR WITHOUT PRESENCE OF ULCERS (sore throat, sores in mouth, or a toothache) ?UNUSUAL RASH, SWELLING OR PAIN  ?UNUSUAL VAGINAL DISCHARGE OR ITCHING  ? ?Items with * indicate a potential emergency and should be followed up as soon as possible or go to the Emergency Department if any problems should occur. ? ?Please show the CHEMOTHERAPY ALERT CARD or IMMUNOTHERAPY ALERT CARD at check-in to the Emergency  Department and triage nurse. ? ?Should you have questions after your visit or need to cancel or reschedule your appointment, please contact [facility] [phone]  and follow the prompts.  Office hours are 8:00 a.m. to 4:30 p.m. Monday - Friday. Please note that voicemails left after 4:00 p.m. may not be returned until the following business day.  We are closed weekends and major holidays. You have access to a nurse at all times for urgent questions. Please call the main number to the clinic [phone] and follow the prompts. ? ?For any non-urgent questions, you may also contact your provider using MyChart. We now offer e-Visits for anyone 29 and older to request care online for non-urgent symptoms. For details visit [email or web address]. ?  ?Also download the MyChart app! Go to the app store, search "MyChart", open the app, select [facility], and log in with your MyChart username and password. ? ?Due to Covid, a mask is required upon entering the hospital/clinic. If you do not have a mask, one will be given to you upon arrival. For doctor visits, patients may have 1 support person aged 36 or older with them. For treatment visits, patients cannot have anyone with them due to current Covid guidelines and our immunocompromised population.  ?

## 2021-08-19 NOTE — Patient Instructions (Addendum)
[place] at [facility] ?Discharge Instructions ? ?You were seen today by Tarri Abernethy PA-C for your chemotherapy follow-up and symptom management visit.   ? ?DEHYDRATION: We gave you IV fluids today.  Make sure that you are drinking at least 80 ounces of water (2.5 L) daily. ? ?DIARRHEA: If you have any recurrent diarrhea, please remember that we have sent you a prescription for Imodium.  For any additional episodes of diarrhea, you should take 2 capsules of Imodium immediately after your first loose bowel movement and take 1 capsule after each subsequent loose bowel movement. ? ?NAUSEA: Continue to take 1 tablet of Zofran every 8 hours as needed for nausea. ? ?URINARY TRACT INFECTION: You should have already completed your treatment of UTI with Macrobid antibiotic 100 mg twice daily x7 days.  Since you are still having symptoms, we will check your urine today to see if you have any further infection, and we will call you with further instructions if needed. ? ?NUTRITION: Try to eat small and more frequent meals throughout the day since you are not able to consume large meals at this time.  You should supplement your diet with at least 2 nutritional shakes daily (such as Ensure or Boost). ? ?FOLLOW-UP APPOINTMENT: You are scheduled for repeat labs, visit with Dr. Delton Coombes, and cycle #2 of chemotherapy next week on 08/26/2021. ? ?-------------------------------------------- ? ? ?Thank you for choosing [place] at [facility] to provide your oncology and hematology care.  To afford each patient quality time with our provider, please arrive at least 15 minutes before your scheduled appointment time.  ? ?If you have a lab appointment with the [place] please come in thru the Main Entrance and check in at the main information desk. ? ?You need to re-schedule your appointment should you arrive 10 or more minutes late.  We strive to give you quality time with  our providers, and arriving late affects you and other patients whose appointments are after yours.  Also, if you no show three or more times for appointments you may be dismissed from the clinic at the providers discretion.     ?Again, thank you for choosing [facility].  Our hope is that these requests will decrease the amount of time that you wait before being seen by our physicians.       ?_____________________________________________________________ ? ?Should you have questions after your visit to [facility], please contact our office at [phone] and follow the prompts.  Our office hours are 8:00 a.m. and 4:30 p.m. Monday - Friday.  Please note that voicemails left after 4:00 p.m. may not be returned until the following business day.  We are closed weekends and major holidays.  You do have access to a nurse 24-7, just call the main number to the clinic [phone] and do not press any options, hold on the line and a nurse will answer the phone.   ? ?For prescription refill requests, have your pharmacy contact our office and allow 72 hours.   ? ?Due to Covid, you will need to wear a mask upon entering the hospital. If you do not have a mask, a mask will be given to you at the Main Entrance upon arrival. For doctor visits, patients may have 1 support person age 39 or older with them. For treatment visits, patients can not have anyone with them due to social distancing guidelines and our immunocompromised population.  ? ? ? ?

## 2021-08-19 NOTE — Progress Notes (Signed)
Orders received for 1 L house fluids,  40 mEq potassium chloride powder, 4 mg IV magnesium. Patient tolerated house fluids with no complaints voiced. Side effects with management reviewed with understanding verbalized. Port site clean and dry with no bruising or swelling noted at site. Good blood return noted before and after administration of therapy. Band aid applied. Urine sample sent to lab for urinalysis and urine culture. Discharge instructions reviewed with patient, patient was offered a wheelchair to ride down in but refused. Patient left in satisfactory condition with VSS and no s/s of distress noted.  ?

## 2021-08-21 LAB — URINE CULTURE: Culture: NO GROWTH

## 2021-08-22 ENCOUNTER — Other Ambulatory Visit: Payer: Self-pay | Admitting: Physician Assistant

## 2021-08-26 ENCOUNTER — Inpatient Hospital Stay (HOSPITAL_COMMUNITY): Payer: Medicare Other

## 2021-08-26 ENCOUNTER — Encounter (HOSPITAL_COMMUNITY): Payer: Medicare Other | Admitting: Dietician

## 2021-08-26 ENCOUNTER — Inpatient Hospital Stay (HOSPITAL_BASED_OUTPATIENT_CLINIC_OR_DEPARTMENT_OTHER): Payer: Medicare Other | Admitting: Hematology

## 2021-08-26 ENCOUNTER — Ambulatory Visit (HOSPITAL_COMMUNITY)
Admission: RE | Admit: 2021-08-26 | Discharge: 2021-08-26 | Disposition: A | Discharge status: Home / Self Care | Payer: Medicare Other | Source: Ambulatory Visit | Attending: Hematology | Admitting: Hematology

## 2021-08-26 ENCOUNTER — Inpatient Hospital Stay (HOSPITAL_COMMUNITY): Payer: Medicare Other | Admitting: Dietician

## 2021-08-26 VITALS — BP 127/74 | HR 92 | Temp 99.1°F | Resp 18

## 2021-08-26 DIAGNOSIS — C50412 Malignant neoplasm of upper-outer quadrant of left female breast: Secondary | ICD-10-CM

## 2021-08-26 DIAGNOSIS — Z170 Estrogen receptor positive status [ER+]: Secondary | ICD-10-CM

## 2021-08-26 DIAGNOSIS — R0781 Pleurodynia: Secondary | ICD-10-CM

## 2021-08-26 DIAGNOSIS — R051 Acute cough: Secondary | ICD-10-CM | POA: Diagnosis present

## 2021-08-26 DIAGNOSIS — Z95828 Presence of other vascular implants and grafts: Secondary | ICD-10-CM

## 2021-08-26 DIAGNOSIS — D630 Anemia in neoplastic disease: Secondary | ICD-10-CM | POA: Diagnosis not present

## 2021-08-26 LAB — CBC WITH DIFFERENTIAL/PLATELET
Abs Immature Granulocytes: 0.12 10*3/uL — ABNORMAL HIGH (ref 0.00–0.07)
Basophils Absolute: 0.1 10*3/uL (ref 0.0–0.1)
Basophils Relative: 0 %
Eosinophils Absolute: 0 10*3/uL (ref 0.0–0.5)
Eosinophils Relative: 0 %
HCT: 32.4 % — ABNORMAL LOW (ref 36.0–46.0)
Hemoglobin: 9.8 g/dL — ABNORMAL LOW (ref 12.0–15.0)
Immature Granulocytes: 1 %
Lymphocytes Relative: 16 %
Lymphs Abs: 2.5 10*3/uL (ref 0.7–4.0)
MCH: 31.2 pg (ref 26.0–34.0)
MCHC: 30.2 g/dL (ref 30.0–36.0)
MCV: 103.2 fL — ABNORMAL HIGH (ref 80.0–100.0)
Monocytes Absolute: 1.1 10*3/uL — ABNORMAL HIGH (ref 0.1–1.0)
Monocytes Relative: 7 %
Neutro Abs: 12 10*3/uL — ABNORMAL HIGH (ref 1.7–7.7)
Neutrophils Relative %: 76 %
Platelets: 475 10*3/uL — ABNORMAL HIGH (ref 150–400)
RBC: 3.14 MIL/uL — ABNORMAL LOW (ref 3.87–5.11)
RDW: 14.6 % (ref 11.5–15.5)
WBC: 15.7 10*3/uL — ABNORMAL HIGH (ref 4.0–10.5)
nRBC: 0 % (ref 0.0–0.2)

## 2021-08-26 LAB — COMPREHENSIVE METABOLIC PANEL
ALT: 11 U/L (ref 0–44)
AST: 14 U/L — ABNORMAL LOW (ref 15–41)
Albumin: 2.8 g/dL — ABNORMAL LOW (ref 3.5–5.0)
Alkaline Phosphatase: 131 U/L — ABNORMAL HIGH (ref 38–126)
Anion gap: 7 (ref 5–15)
BUN: 9 mg/dL (ref 8–23)
CO2: 35 mmol/L — ABNORMAL HIGH (ref 22–32)
Calcium: 8.7 mg/dL — ABNORMAL LOW (ref 8.9–10.3)
Chloride: 98 mmol/L (ref 98–111)
Creatinine, Ser: 0.71 mg/dL (ref 0.44–1.00)
GFR, Estimated: >60 mL/min (ref >60)
Glucose, Bld: 155 mg/dL — ABNORMAL HIGH (ref 70–99)
Potassium: 5 mmol/L (ref 3.5–5.1)
Sodium: 140 mmol/L (ref 135–145)
Total Bilirubin: 0.2 mg/dL — ABNORMAL LOW (ref 0.3–1.2)
Total Protein: 6 g/dL — ABNORMAL LOW (ref 6.5–8.1)

## 2021-08-26 LAB — MAGNESIUM: Magnesium: 1.8 mg/dL (ref 1.7–2.4)

## 2021-08-26 IMAGING — DX DG RIBS 2V*R*
4 series · 4 of 4 positions shown · non-contrast
Comparison: Chest x-ray dated [DATE]. CT chest dated [DATE].

CLINICAL DATA: Right anterolateral rib pain since fall last
[REDACTED].

EXAM:
CHEST - 2 VIEW; RIGHT RIBS - 2 VIEW

[rib pa (1 of 2)]
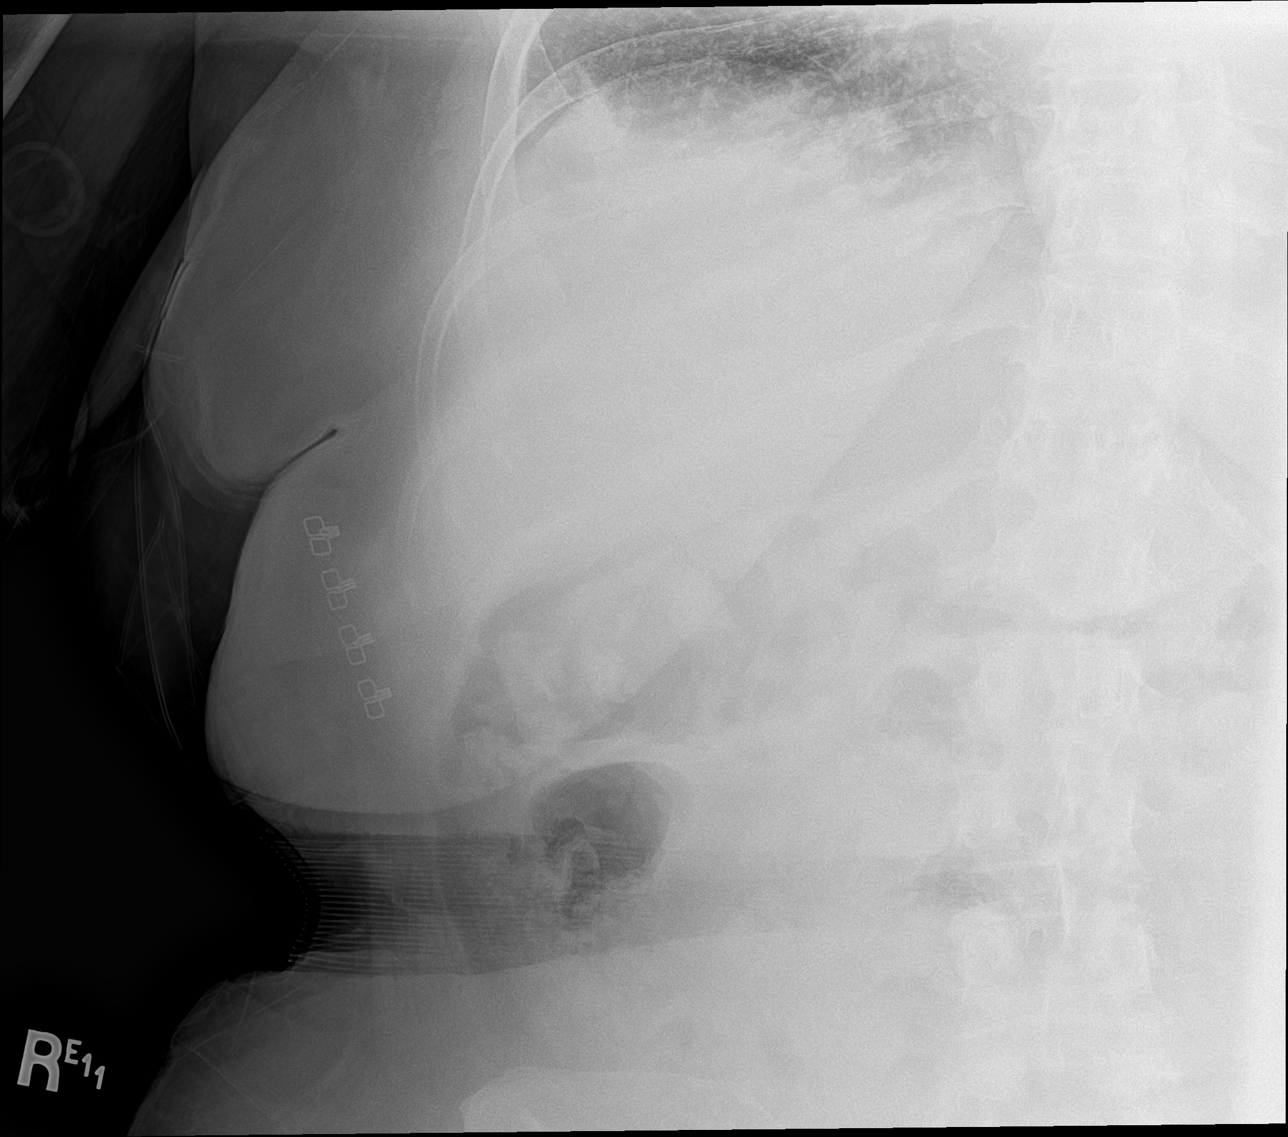

[rib pa (2 of 2)]
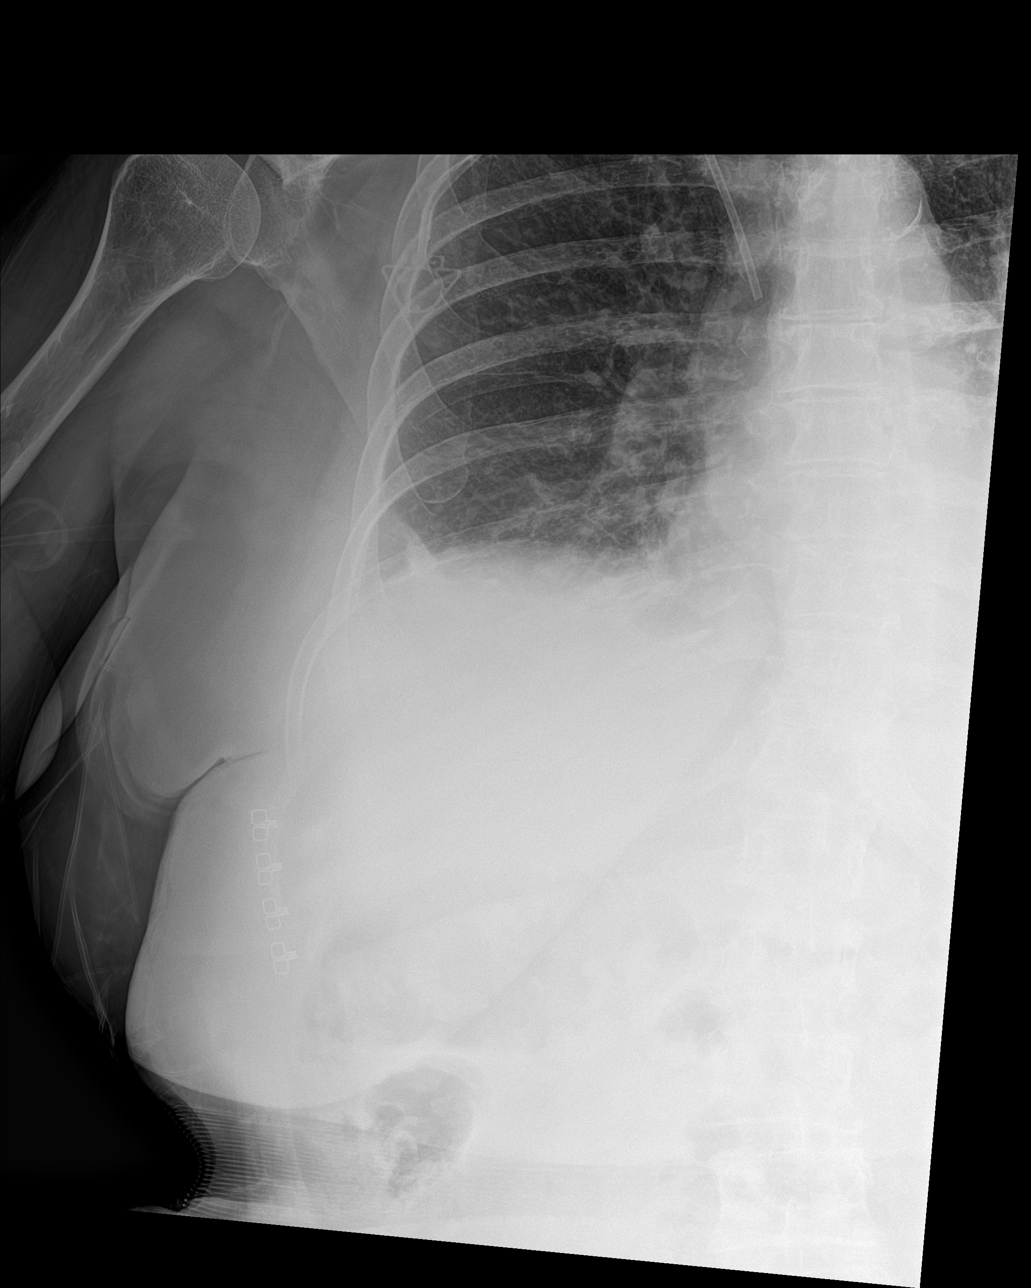

[rib obl (1 of 2)]
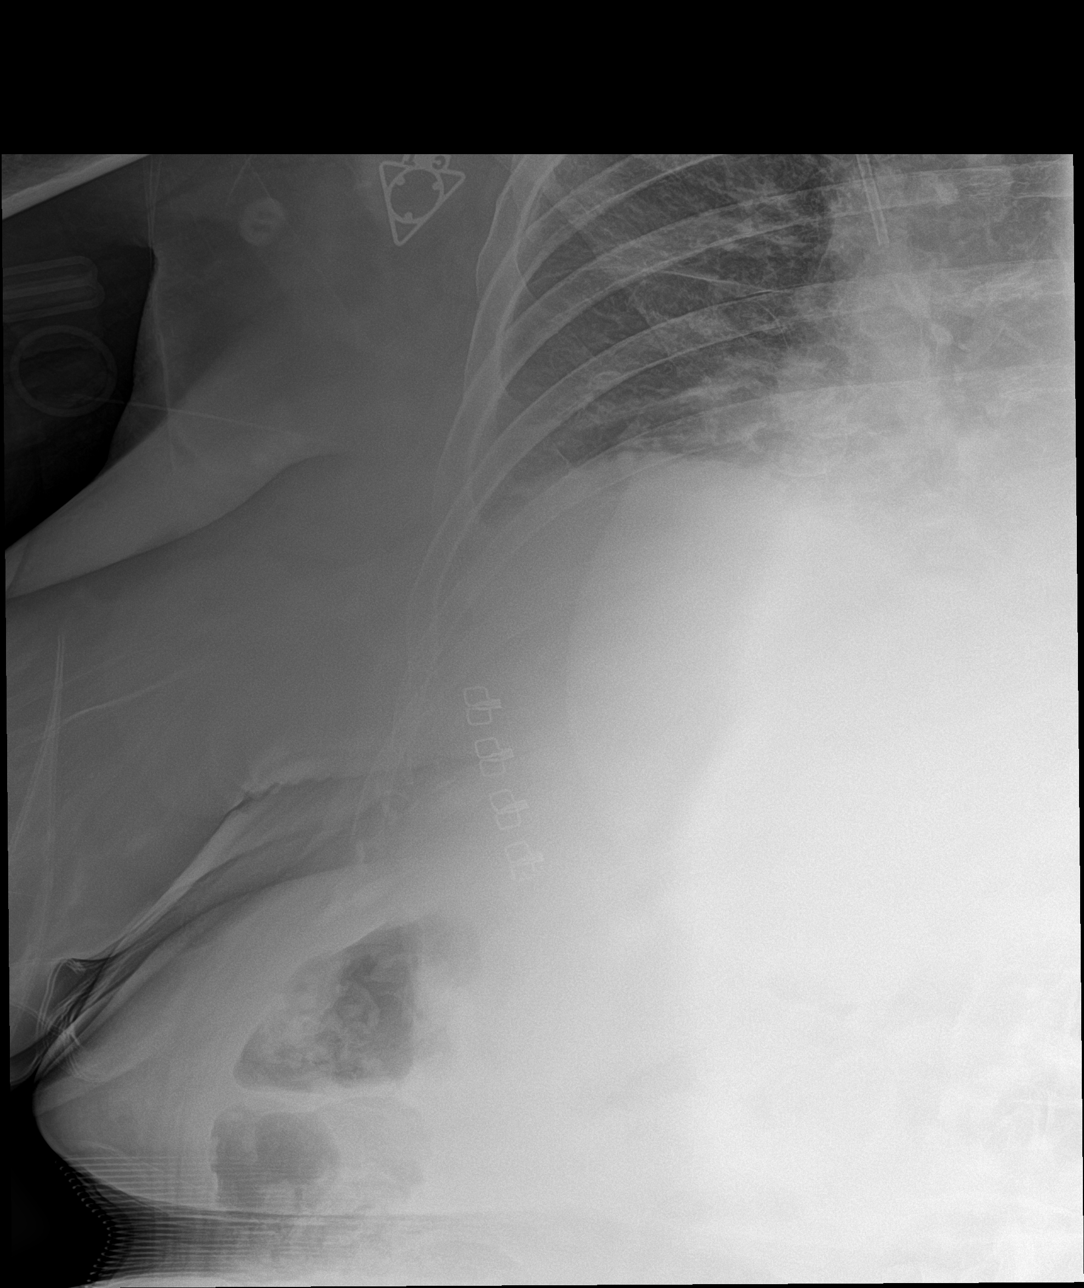

[rib obl (2 of 2)]
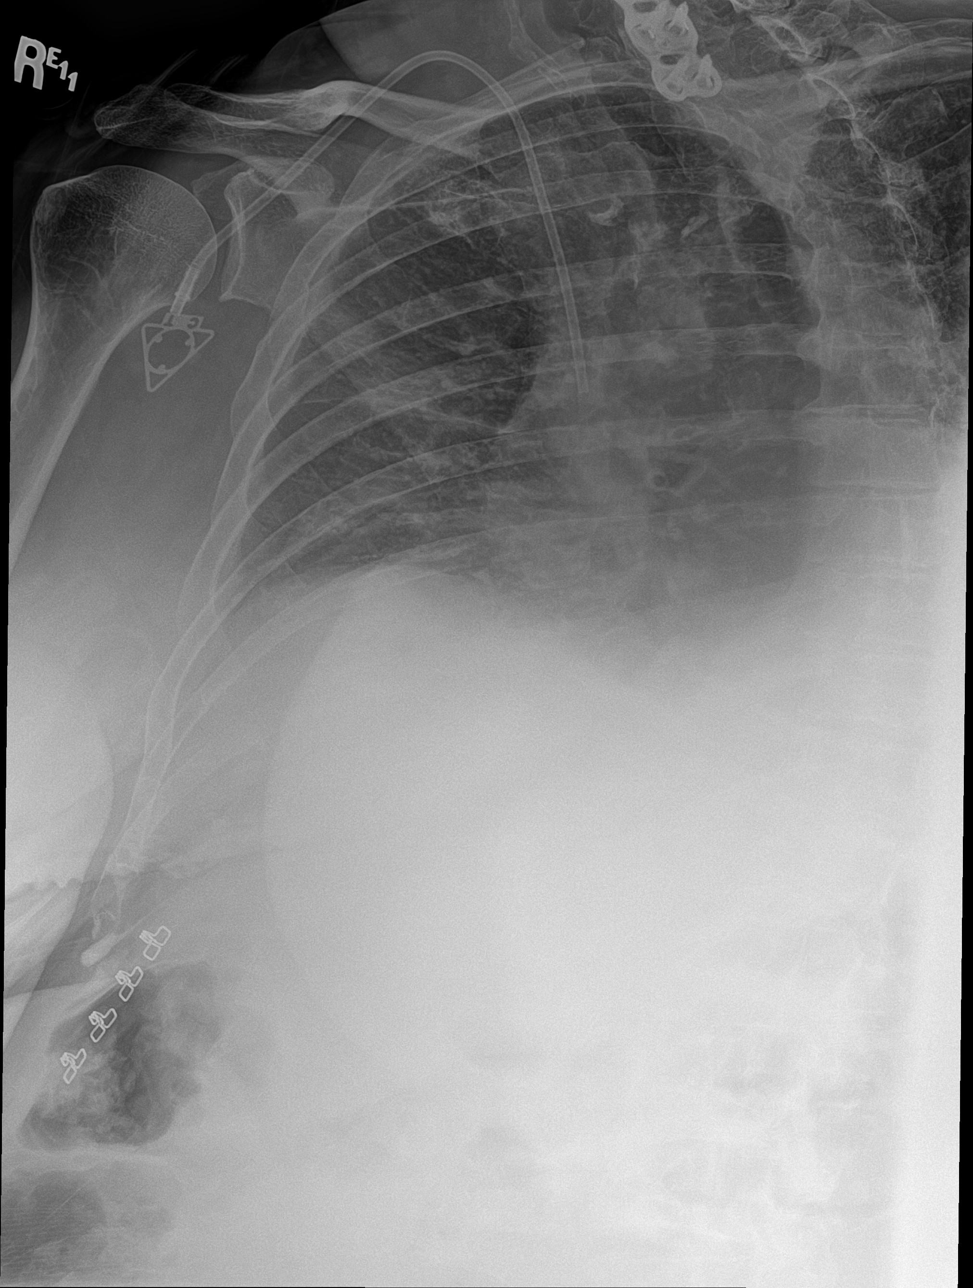

[4 of 4 positions shown; findings below may reference images not displayed]

FINDINGS: No rib fracture. Unchanged right chest wall port catheter. The heart
is at the upper limits of normal in size. New trace right pleural
effusion and minimal bibasilar atelectasis. No pneumothorax.
IMPRESSION: 1. No visible rib fracture.
2. New trace right pleural effusion.

## 2021-08-26 IMAGING — DX DG CHEST 2V
2 series · 2 of 2 positions shown · non-contrast
Comparison: Chest x-ray dated [DATE]. CT chest dated [DATE].

CLINICAL DATA: Right anterolateral rib pain since fall last
[REDACTED].

EXAM:
CHEST - 2 VIEW; RIGHT RIBS - 2 VIEW

[chest pa]
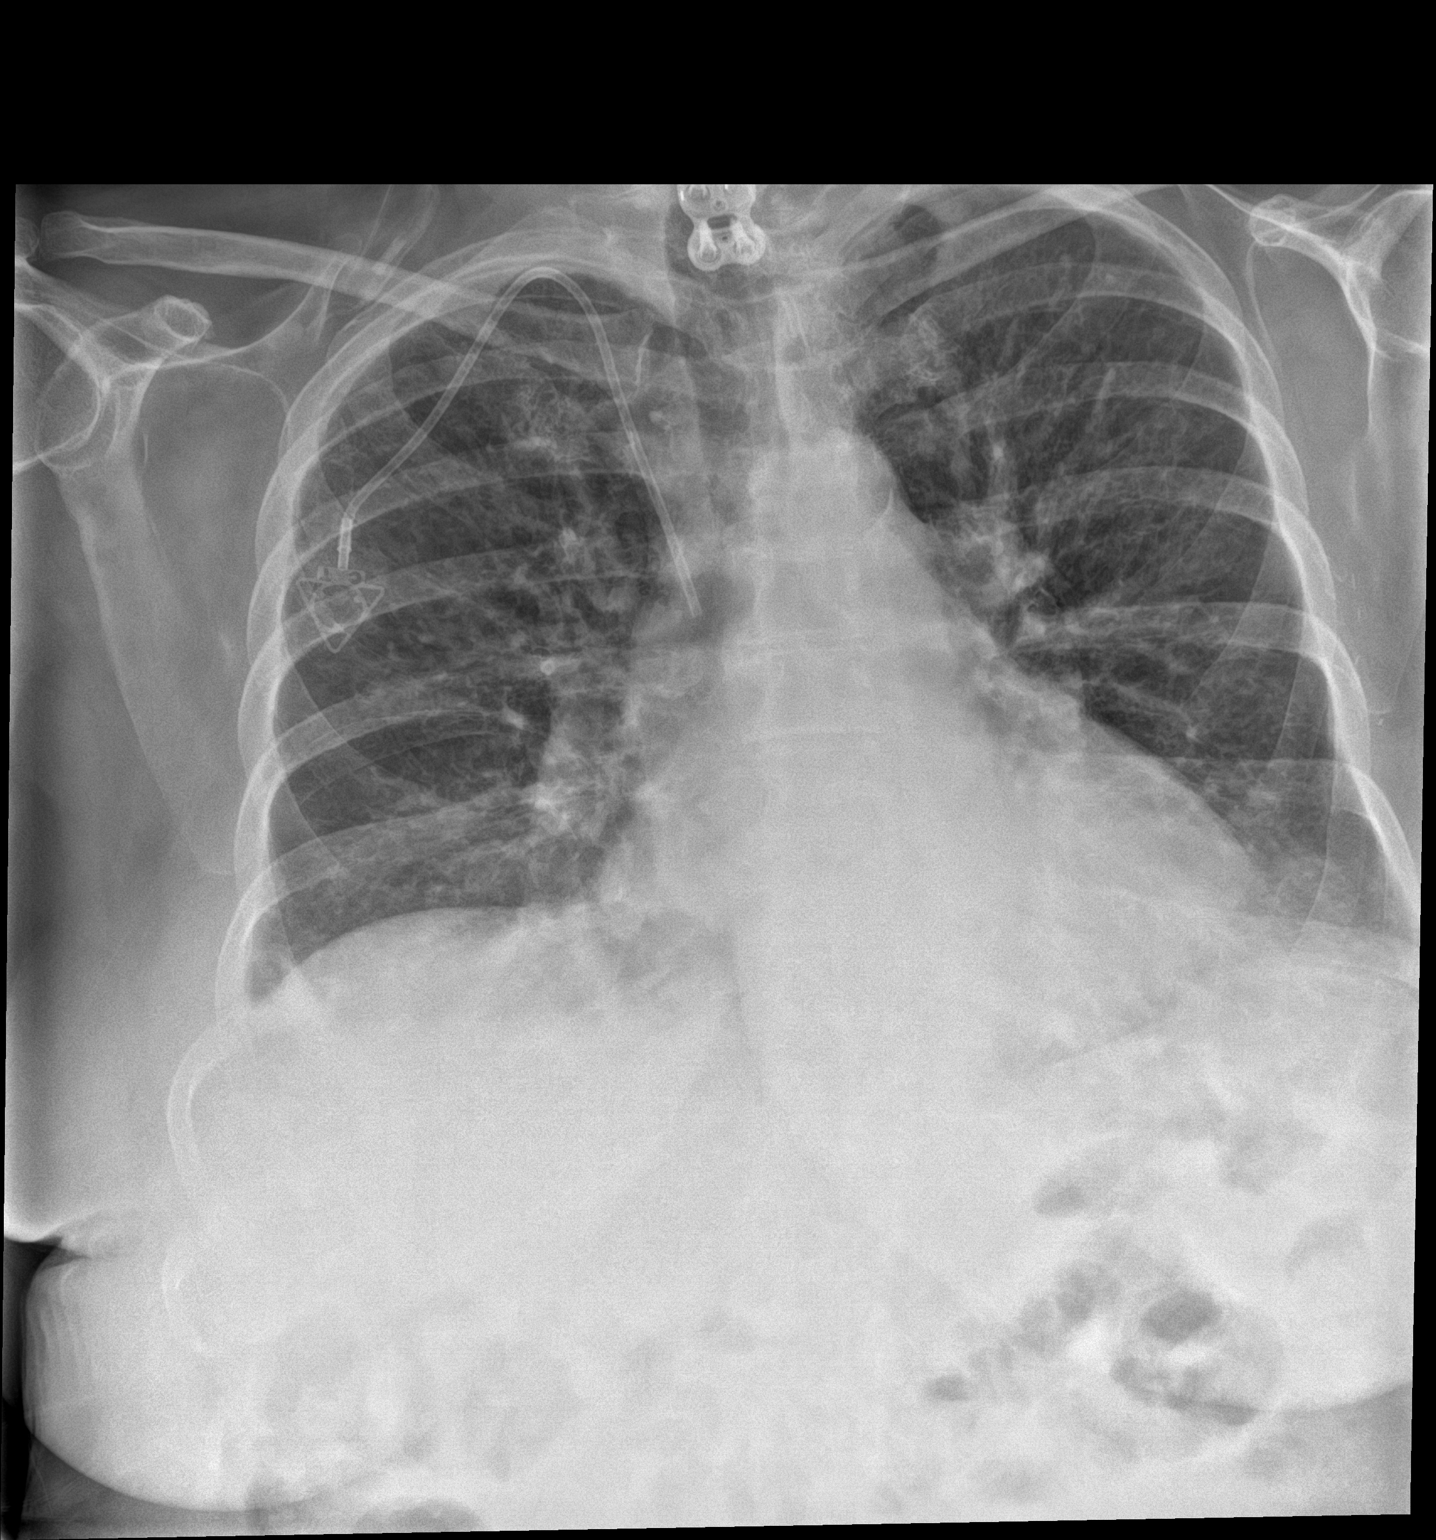

[chest lat]
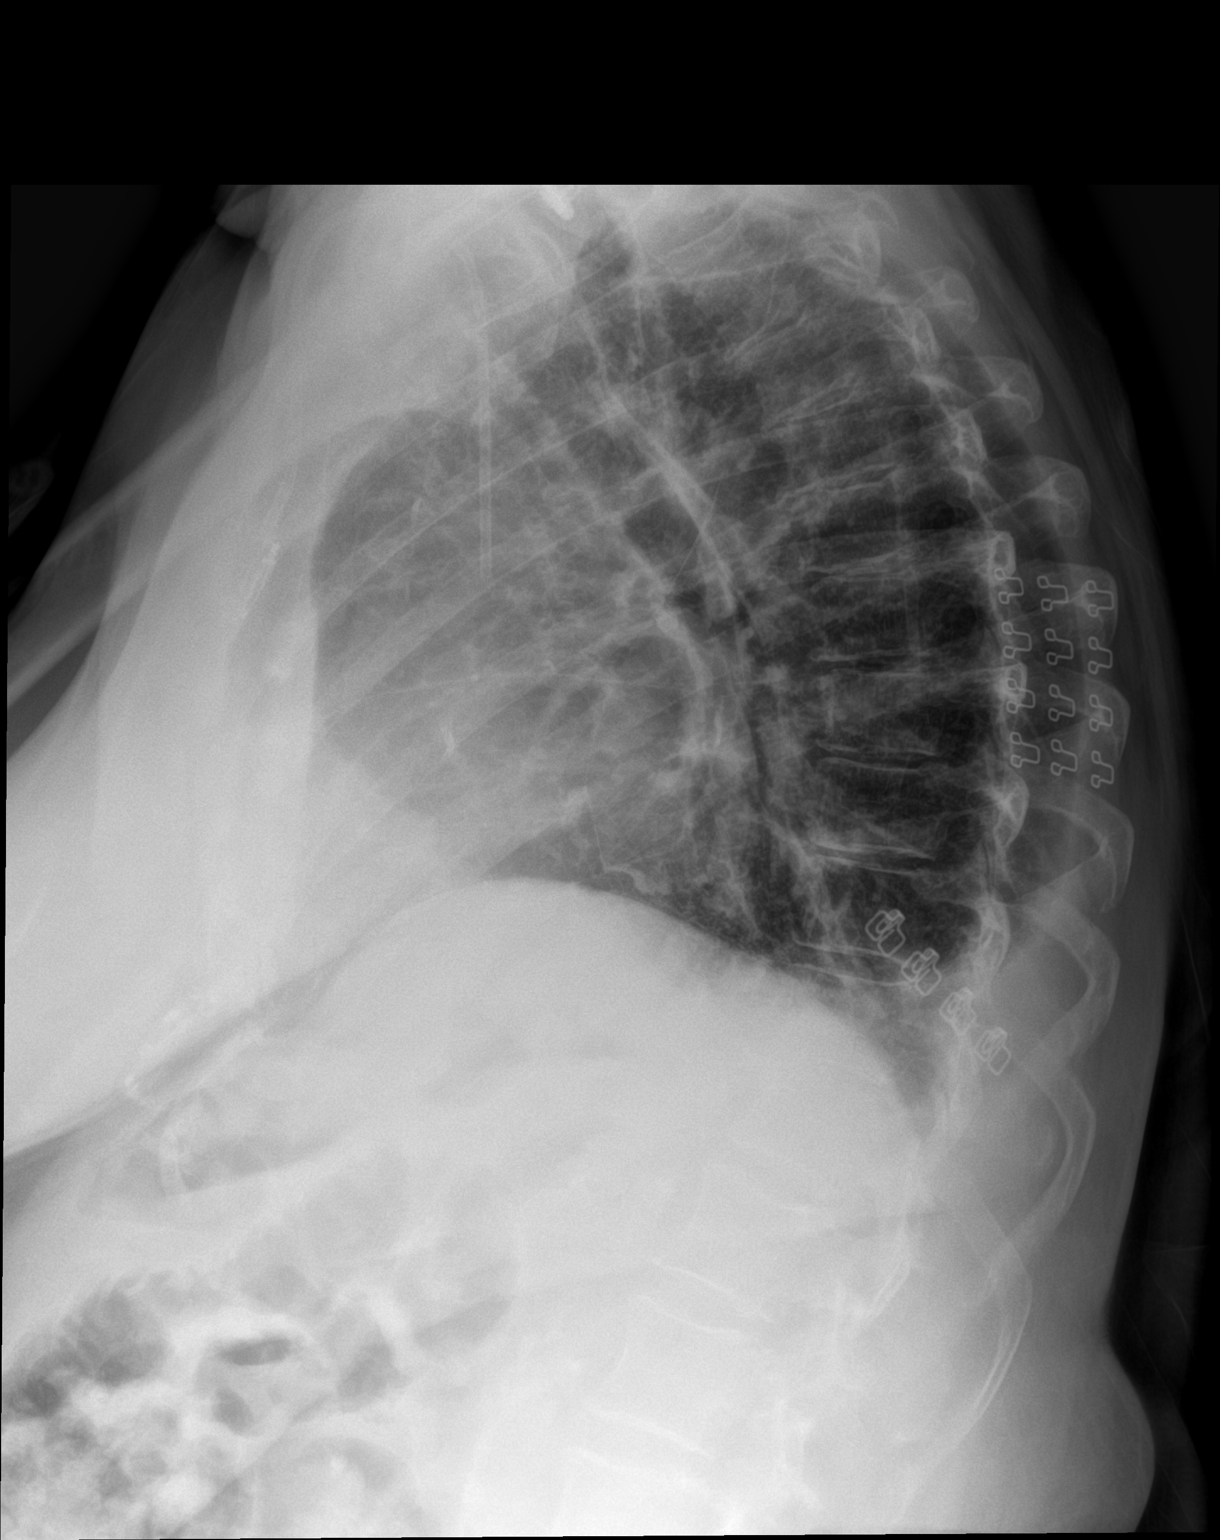

[2 of 2 positions shown; findings below may reference images not displayed]

FINDINGS: No rib fracture. Unchanged right chest wall port catheter. The heart
is at the upper limits of normal in size. New trace right pleural
effusion and minimal bibasilar atelectasis. No pneumothorax.
IMPRESSION: 1. No visible rib fracture.
2. New trace right pleural effusion.

## 2021-08-26 MED ORDER — SODIUM CHLORIDE 0.9 % IV SOLN: Freq: Once | INTRAVENOUS | Status: AC

## 2021-08-26 MED ORDER — SODIUM CHLORIDE 0.9% FLUSH
10.0000 mL | INTRAVENOUS | Status: DC | PRN
  Administered 2021-08-26: 10 mL

## 2021-08-26 MED ORDER — SODIUM CHLORIDE 0.9 % IV SOLN
10.0000 mg | Freq: Once | INTRAVENOUS | Status: AC
  Administered 2021-08-26: 10 mg via INTRAVENOUS
  Filled 2021-08-26: qty 10

## 2021-08-26 MED ORDER — SODIUM CHLORIDE 0.9 % IV SOLN
60.0000 mg/m2 | Freq: Once | INTRAVENOUS | Status: AC
  Administered 2021-08-26: 100 mg via INTRAVENOUS
  Filled 2021-08-26: qty 10

## 2021-08-26 MED ORDER — SODIUM CHLORIDE 0.9 % IV SOLN
480.0000 mg/m2 | Freq: Once | INTRAVENOUS | Status: AC
  Administered 2021-08-26: 840 mg via INTRAVENOUS
  Filled 2021-08-26: qty 42

## 2021-08-26 MED ORDER — HEPARIN SOD (PORK) LOCK FLUSH 100 UNIT/ML IV SOLN
500.0000 [IU] | Freq: Once | INTRAVENOUS | Status: AC | PRN
  Administered 2021-08-26: 500 [IU]

## 2021-08-26 MED ORDER — PALONOSETRON HCL INJECTION 0.25 MG/5ML
0.2500 mg | Freq: Once | INTRAVENOUS | Status: AC
  Administered 2021-08-26: 0.25 mg via INTRAVENOUS
  Filled 2021-08-26: qty 5

## 2021-08-26 NOTE — Progress Notes (Signed)
? ?[facility] ?[address] ?[place], [place] [address] ? ? ?CLINIC:  ?Medical Oncology/Hematology ? ?PCP:  ?Inda Coke, [place] ?[place] / [place] [address] ?[phone] ? ? 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 ?PRIOR THERAPY: none ? ?NGS Results: not done ? ?CURRENT THERAPY: TC q21d ? ?BRIEF ONCOLOGIC HISTORY:  ?Oncology History  ?Malignant neoplasm of upper-outer quadrant of left breast in female, estrogen receptor positive ([place])  ?05/14/2021 Initial Diagnosis  ? Malignant neoplasm of upper-outer quadrant of left breast in female, estrogen receptor positive ([place]) ?  ?08/05/2021 -  Chemotherapy  ? Patient is on Treatment Plan : BREAST TC q21d  ?   ? Genetic Testing  ? Negative genetic testing. No pathogenic variants identified on the [facility] Multi-Cancer+RNA panel. The report date is 07/25/2021. ? ?The Multi-Cancer Panel + RNA offered by [facility] includes sequencing and/or deletion duplication testing of the following 84 genes: AIP, ALK, APC, ATM, AXIN2,BAP1,  BARD1, BLM, BMPR1A, BRCA1, BRCA2, BRIP1, CASR, CDC73, CDH1, CDK4, CDKN1B, CDKN1C, CDKN2A (p14ARF), CDKN2A (p16INK4a), CEBPA, CHEK2, CTNNA1, DICER1, DIS3L2, EGFR (c.2369C>T, p.Thr790Met variant only), EPCAM (Deletion/duplication testing only), FH, FLCN, GATA2, GPC3, GREM1 (Promoter region deletion/duplication testing only), HOXB13 (c.251G>A, p.Gly84Glu), HRAS, KIT, MAX, MEN1, MET, MITF (c.952G>A, p.Glu318Lys variant only), MLH1, MSH2, MSH3, MSH6, MUTYH, NBN, NF1, NF2, NTHL1, PALB2, PDGFRA, PHOX2B, PMS2, POLD1, POLE, POT1, PRKAR1A, PTCH1, PTEN, RAD50, RAD51C, RAD51D, RB1, RECQL4, RET, RUNX1, SDHAF2, SDHA (sequence changes only), SDHB, SDHC, SDHD, SMAD4, SMARCA4, SMARCB1, SMARCE1, STK11, SUFU, TERC, TERT, TMEM127, TP53, TSC1, TSC2, VHL, WRN and WT1. ?  ? ? ?CANCER STAGING: ? Cancer Staging  ?Malignant neoplasm of upper-outer quadrant of left breast in female, estrogen receptor positive  ([place]) ?Staging form: Breast, AJCC 8th Edition ?- Clinical stage from 07/01/2021: Stage IB (cT1b, cN0, cM0, G3, ER+, PR-, HER2-) - Unsigned ? ? ?INTERVAL HISTORY:  ?[name], a 66 y.o. female, returns for routine follow-up and consideration for next cycle of chemotherapy. [name] was last seen on 08/05/2021. ? ?Due for cycle #2 of TC today.  ? ?Overall, she tells me she has been feeling pretty well, and she is accompanied by her sister. She report lower back pain and bilateral leg swelling which is worse on the right starting following a fall on 3/22; she was walking in her home and reports leg weakness. She denies syncope. She reports another fall on 4/5 following she reports rib pain and pain upon inspiration. She denies fevers and chills. She reports a dry cough which has worsened since her fall. Her nausea and diarrhea have improved. She reports numbness in her feet which is stable; she denies numbness or tingling in her fingers. Her sister reports she has been increasingly depressed.  ? ?Overall, she feels ready for next cycle of chemo today.  ? ? ?REVIEW OF SYSTEMS:  ?Review of Systems  ?Constitutional:  Positive for fatigue. Negative for appetite change, chills and fever.  ?Respiratory:  Positive for cough (dry) and shortness of breath.   ?Cardiovascular:  Positive for chest pain (R lateral rib pain) and leg swelling (feet).  ?Gastrointestinal:  Negative for diarrhea (improved) and nausea (improved).  ?Musculoskeletal:  Positive for back pain (6/10 lower).  ?Neurological:  Positive for extremity weakness (leg), headaches and numbness (stable).  ?Psychiatric/Behavioral:  Positive for depression.   ?All other systems reviewed and are negative. ? ?PAST MEDICAL/SURGICAL HISTORY:  ?Past Medical History:  ?Diagnosis Date  ? Anxiety   ?  Arthritis   ? Asthma   ? Chronic back pain   ? Colon polyps   ? COPD (chronic obstructive pulmonary disease) ([place])   ? Depression   ? Emphysema of lung ([place])   ? Family  history of breast cancer   ? GERD (gastroesophageal reflux disease)   ? Memory change   ? Port-A-Cath in place 08/01/2021  ? Sleep apnea   ? TIA (transient ischemic attack)   ? Tremor   ? ?Past Surgical History:  ?Procedure Laterality Date  ? ABDOMINAL HYSTERECTOMY    ? BREAST LUMPECTOMY WITH RADIOFREQUENCY TAG IDENTIFICATION Left 10/13/7822  ? Procedure: BREAST LUMPECTOMY WITH RADIOFREQUENCY TAG IDENTIFICATION;  Surgeon: Virl Cagey, MD;  Location: [facility];  Service: General;  Laterality: Left;  ? CERVICAL SPINE SURGERY    ? INNER EAR SURGERY    ? LUMBAR FUSION  07/09/10  ? L3-L7  ? PARTIAL MASTECTOMY WITH AXILLARY SENTINEL LYMPH NODE BIOPSY Left 05/31/2021  ? Procedure: PARTIAL MASTECTOMY WITH AXILLARY SENTINEL LYMPH NODE BIOPSY;  Surgeon: Virl Cagey, MD;  Location: [facility];  Service: General;  Laterality: Left;  ? PORTACATH PLACEMENT Right 07/30/2021  ? Procedure: INSERTION PORT-A-CATH;  Surgeon: Virl Cagey, MD;  Location: [facility];  Service: General;  Laterality: Right;  ? ? ?SOCIAL HISTORY:  ?Social History  ? ?Socioeconomic History  ? Marital status: Widowed  ?  Spouse name: Not on file  ? Number of children: 1  ? Years of education: 12  ? Highest education level: 11th grade  ?Occupational History  ? Not on file  ?Tobacco Use  ? Smoking status: Every Day  ?  Packs/day: 0.50  ?  Types: Cigarettes  ?  Last attempt to quit: 07/17/2020  ?  Years since quitting: 1.1  ? Smokeless tobacco: Never  ? Tobacco comments:  ?  Not smoking cigarettes but is vaping  04/22/21  ?Vaping Use  ? Vaping Use: Every day  ? Substances: Nicotine, Flavoring  ?Substance and Sexual Activity  ? Alcohol use: Not Currently  ? Drug use: Not Currently  ?  Types: Other-see comments  ?  Comment: CBD and medical marijuana- denies 8/28/2,04/22/21  ? Sexual activity: Not Currently  ?Other Topics Concern  ? Not on file  ?Social History Narrative  ? 04/22/21 Lives alone  ? From [place], moved to [place] in 08-07-19  ? Husband passed away in  2016/07/09  ? ?Social Determinants of Health  ? ?Financial Resource Strain: Not on file  ?Food Insecurity: Not on file  ?Transportation Needs: Not on file  ?Physical Activity: Not on file  ?Stress: Not on file  ?Social Connections: Not on file  ?Intimate Partner Violence: Not on file  ? ? ?FAMILY HISTORY:  ?Family History  ?Problem Relation Age of Onset  ? Depression Mother   ? Diabetes Mother   ? Hypertension Mother   ? Hyperlipidemia Mother   ? Heart attack Mother   ? Osteoarthritis Father   ? Asthma Father   ? COPD Father   ? Breast cancer Sister   ?     dx 47s-60s  ? Lupus Sister   ? Osteoarthritis Sister   ? Asthma Sister   ? COPD Sister   ? Diabetes Sister   ? Drug abuse Sister   ? Heart attack Sister   ? Heart attack Maternal Grandmother   ? Colon cancer Neg Hx   ? Esophageal cancer Neg Hx   ? ? ?CURRENT MEDICATIONS:  ?Current Outpatient Medications  ?  Medication Sig Dispense Refill  ? albuterol (PROVENTIL) (2.5 MG/3ML) 0.083% nebulizer solution Take 3 mLs (2.5 mg total) by nebulization every 6 (six) hours as needed for wheezing or shortness of breath. 150 mL 1  ? albuterol (VENTOLIN HFA) 108 (90 Base) MCG/ACT inhaler INHALE 1 TO 2 PUFFS INTO THE LUNGS EVERY 6 HOURS AS NEEDED FOR WHEEZING OR SHORTNESS OF BREATH 18 g 5  ? atorvastatin (LIPITOR) 20 MG tablet Take 1 tablet (20 mg total) by mouth daily. 90 tablet 3  ? budesonide-formoterol (SYMBICORT) 160-4.5 MCG/ACT inhaler Inhale 2 puffs into the lungs 2 (two) times daily. 10.2 g 11  ? celecoxib (CELEBREX) 100 MG capsule TAKE 1 CAPSULE(100 MG) BY MOUTH TWICE DAILY 180 capsule 1  ? cyclobenzaprine (FLEXERIL) 10 MG tablet Take 1 tablet (10 mg total) by mouth 3 (three) times daily. 90 tablet 1  ? CYCLOPHOSPHAMIDE IV Inject into the vein every 21 ( twenty-one) days. X 4 cycles    ? DOCETAXEL IV Inject into the vein every 21 ( twenty-one) days. X 4 cycles    ? escitalopram (LEXAPRO) 20 MG tablet TAKE 1 TABLET(20 MG) BY MOUTH DAILY 90 tablet 1  ? furosemide (LASIX) 20 MG  tablet Take 20 mg by mouth daily as needed for fluid.    ? gabapentin (NEURONTIN) 600 MG tablet Take 1 tablet in AM, 1 tablet at noon, and 2 tablets at bedtime. 360 tablet 1  ? lidocaine (LIDODERM) 5 %

## 2021-08-26 NOTE — Progress Notes (Signed)
Patient has been examined by Dr. Katragadda, and vital signs and labs have been reviewed. ANC, Creatinine, LFTs, hemoglobin, and platelets are within treatment parameters per M.D. - pt may proceed with treatment.    °

## 2021-08-26 NOTE — Progress Notes (Signed)
Nutrition Follow-up: ? ?Patient with breast cancer. She is receiving chemotherapy with taxotere + cytoxan q21d. ? ?Met with patient during infusion. She reports appetite has improved and eating well over the last week. Patient reports feeling hungry at time of visit. She politely declined offer of snack or Ensure supplement. Her sister is bringing her food. Patient reports diarrhea has resolved. She has not tried samples provided to her at last visit. Patient forgot about them.  ? ?Medications: reviewed  ? ?Labs: glucose 155 ? ?Anthropometrics: Weight 165 lb 12.8 oz today increased (IV fluids on 4/3) ? ?4/3 - 149 lb 6.4 oz ?3/27 - 152 lb  ?3/14 - 155 lb  ? ? ?NUTRITION DIAGNOSIS: Inadequate oral intake improved  ? ? ? ?INTERVENTION:  ?Continue high calorie high protein foods for weight maintenance ?Encouraged pt to try samples of supplements ?  ? ?MONITORING, EVALUATION, GOAL: weight trends, intake  ? ? ?NEXT VISIT: Monday May 1 during infusion  ? ? ? ?

## 2021-08-26 NOTE — Patient Instructions (Addendum)
[place] at [facility] ?Discharge Instructions ? ? ?You were seen and examined today by Dr. Delton Coombes. ? ?He reviewed your lab work which is normal/stable. ? ?We will obtain a chest x-ray and rib x-rays today prior to treatment.  ? ?We will proceed with your treatment today as long as the x-rays don't show any infection in the lungs.  ? ?Return as scheduled.  ? ? ?Thank you for choosing [place] at [facility] to provide your oncology and hematology care.  To afford each patient quality time with our provider, please arrive at least 15 minutes before your scheduled appointment time.  ? ?If you have a lab appointment with the [place] please come in thru the Main Entrance and check in at the main information desk. ? ?You need to re-schedule your appointment should you arrive 10 or more minutes late.  We strive to give you quality time with our providers, and arriving late affects you and other patients whose appointments are after yours.  Also, if you no show three or more times for appointments you may be dismissed from the clinic at the providers discretion.     ?Again, thank you for choosing [facility].  Our hope is that these requests will decrease the amount of time that you wait before being seen by our physicians.       ?_____________________________________________________________ ? ?Should you have questions after your visit to [facility], please contact our office at [phone] and follow the prompts.  Our office hours are 8:00 a.m. and 4:30 p.m. Monday - Friday.  Please note that voicemails left after 4:00 p.m. may not be returned until the following business day.  We are closed weekends and major holidays.  You do have access to a nurse 24-7, just call the main number to the clinic [phone] and do not press any options, hold on the line and a nurse will answer the phone.   ? ?For prescription refill requests, have  your pharmacy contact our office and allow 72 hours.   ? ?Due to Covid, you will need to wear a mask upon entering the hospital. If you do not have a mask, a mask will be given to you at the Main Entrance upon arrival. For doctor visits, patients may have 1 support person age 66 or older with them. For treatment visits, patients can not have anyone with them due to social distancing guidelines and our immunocompromised population.  ? ?[facility]  Discharge Instructions: ?Thank you for choosing [facility] to provide your oncology and hematology care.  ?If you have a lab appointment with the [facility] [facility], please come in thru the Main Entrance and check in at the main information desk. ? ?Wear comfortable clothing and clothing appropriate for easy access to any Portacath or PICC line.  ? ?We strive to give you quality time with your provider. You may need to reschedule your appointment if you arrive late (15 or more minutes).  Arriving late affects you and other patients whose appointments are after yours.  Also, if you miss three or more appointments without notifying the office, you may be dismissed from the clinic at the provider?s discretion.    ?  ?For prescription refill requests, have your pharmacy contact our office and allow 72 hours for refills to be completed.   ? ?Today you received the following chemotherapy and/or immunotherapy agents Taxol and Carbo ?  ?  To help prevent nausea and vomiting after your treatment, we encourage you to take your nausea medication as directed. ? ?BELOW ARE SYMPTOMS THAT SHOULD BE REPORTED IMMEDIATELY: ?*FEVER GREATER THAN 100.4 F (38 ?C) OR HIGHER ?*CHILLS OR SWEATING ?*NAUSEA AND VOMITING THAT IS NOT CONTROLLED WITH YOUR NAUSEA MEDICATION ?*UNUSUAL SHORTNESS OF BREATH ?*UNUSUAL BRUISING OR BLEEDING ?*URINARY PROBLEMS (pain or burning when urinating, or frequent urination) ?*BOWEL PROBLEMS (unusual diarrhea, constipation, pain near the  anus) ?TENDERNESS IN MOUTH AND THROAT WITH OR WITHOUT PRESENCE OF ULCERS (sore throat, sores in mouth, or a toothache) ?UNUSUAL RASH, SWELLING OR PAIN  ?UNUSUAL VAGINAL DISCHARGE OR ITCHING  ? ?Items with * indicate a potential emergency and should be followed up as soon as possible or go to the Emergency Department if any problems should occur. ? ?Please show the CHEMOTHERAPY ALERT CARD or IMMUNOTHERAPY ALERT CARD at check-in to the Emergency Department and triage nurse. ? ?Should you have questions after your visit or need to cancel or reschedule your appointment, please contact [facility] [phone]  and follow the prompts.  Office hours are 8:00 a.m. to 4:30 p.m. Monday - Friday. Please note that voicemails left after 4:00 p.m. may not be returned until the following business day.  We are closed weekends and major holidays. You have access to a nurse at all times for urgent questions. Please call the main number to the clinic [phone] and follow the prompts. ? ?For any non-urgent questions, you may also contact your provider using MyChart. We now offer e-Visits for anyone 61 and older to request care online for non-urgent symptoms. For details visit [email or web address]. ?  ?Also download the MyChart app! Go to the app store, search "MyChart", open the app, select [place], and log in with your MyChart username and password. ? ?Due to Covid, a mask is required upon entering the hospital/clinic. If you do not have a mask, one will be given to you upon arrival. For doctor visits, patients may have 1 support person aged 4 or older with them. For treatment visits, patients cannot have anyone with them due to current Covid guidelines and our immunocompromised population.  ? ?   ?

## 2021-08-26 NOTE — Progress Notes (Signed)
Pt presents today for Taxol and Cytoxan per provider's order. Vital signs and labs WNL for treatment today. Okay to proceed with treatment today per Dr.K. Pt c/o rib pain 6/10 after falling at home Wednesday. Dr.K sent pt to have a x-ray of ribs and lungs and no abnormalities were found per Dr.K. Pt made aware and verbalized understanding. Pt okay to proceed with treatment today per Dr.K. ? ?Taxol and Cytoxan given today per MD orders. Tolerated infusion without adverse affects. Vital signs stable. No complaints at this time. Discharged from clinic via wheelchair in stable condition. Alert and oriented x 3. F/U with [facility] as scheduled.   ?

## 2021-08-28 ENCOUNTER — Inpatient Hospital Stay (HOSPITAL_COMMUNITY): Payer: Medicare Other

## 2021-08-28 ENCOUNTER — Ambulatory Visit (HOSPITAL_COMMUNITY): Payer: Medicare Other

## 2021-08-28 VITALS — BP 111/57 | HR 100 | Temp 97.2°F | Resp 20

## 2021-08-28 DIAGNOSIS — Z95828 Presence of other vascular implants and grafts: Secondary | ICD-10-CM

## 2021-08-28 DIAGNOSIS — J189 Pneumonia, unspecified organism: Secondary | ICD-10-CM | POA: Diagnosis not present

## 2021-08-28 DIAGNOSIS — Z170 Estrogen receptor positive status [ER+]: Secondary | ICD-10-CM

## 2021-08-28 MED ORDER — PEGFILGRASTIM-BMEZ 6 MG/0.6ML ~~LOC~~ SOSY
6.0000 mg | PREFILLED_SYRINGE | Freq: Once | SUBCUTANEOUS | Status: AC
  Administered 2021-08-28: 6 mg via SUBCUTANEOUS
  Filled 2021-08-28: qty 0.6

## 2021-08-28 NOTE — Patient Instructions (Signed)
[facility]  Discharge Instructions: Thank you for choosing [facility] to provide your oncology and hematology care.  If you have a lab appointment with the Cancer Center, please come in thru the Main Entrance and check in at the main information desk.  Wear comfortable clothing and clothing appropriate for easy access to any Portacath or PICC line.   We strive to give you quality time with your provider. You may need to reschedule your appointment if you arrive late (15 or more minutes).  Arriving late affects you and other patients whose appointments are after yours.  Also, if you miss three or more appointments without notifying the office, you may be dismissed from the clinic at the provider's discretion.      For prescription refill requests, have your pharmacy contact our office and allow 72 hours for refills to be completed.    Today you received the following chemotherapy and/or immunotherapy agents Ziextenzo      To help prevent nausea and vomiting after your treatment, we encourage you to take your nausea medication as directed.  BELOW ARE SYMPTOMS THAT SHOULD BE REPORTED IMMEDIATELY: *FEVER GREATER THAN 100.4 F (38 C) OR HIGHER *CHILLS OR SWEATING *NAUSEA AND VOMITING THAT IS NOT CONTROLLED WITH YOUR NAUSEA MEDICATION *UNUSUAL SHORTNESS OF BREATH *UNUSUAL BRUISING OR BLEEDING *URINARY PROBLEMS (pain or burning when urinating, or frequent urination) *BOWEL PROBLEMS (unusual diarrhea, constipation, pain near the anus) TENDERNESS IN MOUTH AND THROAT WITH OR WITHOUT PRESENCE OF ULCERS (sore throat, sores in mouth, or a toothache) UNUSUAL RASH, SWELLING OR PAIN  UNUSUAL VAGINAL DISCHARGE OR ITCHING   Items with * indicate a potential emergency and should be followed up as soon as possible or go to the Emergency Department if any problems should occur.  Please show the CHEMOTHERAPY ALERT CARD or IMMUNOTHERAPY ALERT CARD at check-in to the Emergency  Department and triage nurse.  Should you have questions after your visit or need to cancel or reschedule your appointment, please contact [facility] [phone]  and follow the prompts.  Office hours are 8:00 a.m. to 4:30 p.m. Monday - Friday. Please note that voicemails left after 4:00 p.m. may not be returned until the following business day.  We are closed weekends and major holidays. You have access to a nurse at all times for urgent questions. Please call the main number to the clinic [phone] and follow the prompts.  For any non-urgent questions, you may also contact your provider using MyChart. We now offer e-Visits for anyone 18 and older to request care online for non-urgent symptoms. For details visit [email or web address].   Also download the MyChart app! Go to the app store, search "MyChart", open the app, select [facility], and log in with your MyChart username and password.  Due to Covid, a mask is required upon entering the hospital/clinic. If you do not have a mask, one will be given to you upon arrival. For doctor visits, patients may have 1 support person aged 18 or older with them. For treatment visits, patients cannot have anyone with them due to current Covid guidelines and our immunocompromised population.  

## 2021-08-28 NOTE — Progress Notes (Signed)
Holly Phillips presents today for Ziextenzo injection per the provider's orders.  Stable during administration without incident; injection site WNL; see MAR for injection details.  Patient tolerated procedure well and without incident.  No questions or complaints noted at this time.  Discharge from clinic ambulatory in stable condition.  Alert and oriented X 3.  Follow up with [facility] as scheduled.  ?

## 2021-08-30 ENCOUNTER — Ambulatory Visit (INDEPENDENT_AMBULATORY_CARE_PROVIDER_SITE_OTHER): Payer: Medicare Other | Admitting: Physician Assistant

## 2021-08-30 ENCOUNTER — Other Ambulatory Visit: Payer: Self-pay

## 2021-08-30 ENCOUNTER — Encounter (HOSPITAL_BASED_OUTPATIENT_CLINIC_OR_DEPARTMENT_OTHER): Payer: Self-pay | Admitting: Emergency Medicine

## 2021-08-30 ENCOUNTER — Inpatient Hospital Stay (HOSPITAL_BASED_OUTPATIENT_CLINIC_OR_DEPARTMENT_OTHER)
Admission: EM | Admit: 2021-08-30 | Discharge: 2021-09-02 | DRG: 193 | Disposition: A | Discharge status: Home / Self Care | Payer: Medicare Other | Attending: Internal Medicine | Admitting: Internal Medicine

## 2021-08-30 ENCOUNTER — Emergency Department (HOSPITAL_BASED_OUTPATIENT_CLINIC_OR_DEPARTMENT_OTHER): Payer: Medicare Other

## 2021-08-30 ENCOUNTER — Encounter: Payer: Self-pay | Admitting: Physician Assistant

## 2021-08-30 VITALS — BP 110/70 | HR 115 | Temp 98.2°F | Ht 60.0 in | Wt 155.4 lb

## 2021-08-30 DIAGNOSIS — E785 Hyperlipidemia, unspecified: Secondary | ICD-10-CM | POA: Diagnosis present

## 2021-08-30 DIAGNOSIS — Z9981 Dependence on supplemental oxygen: Secondary | ICD-10-CM

## 2021-08-30 DIAGNOSIS — R0902 Hypoxemia: Secondary | ICD-10-CM

## 2021-08-30 DIAGNOSIS — Z720 Tobacco use: Secondary | ICD-10-CM | POA: Diagnosis present

## 2021-08-30 DIAGNOSIS — J449 Chronic obstructive pulmonary disease, unspecified: Secondary | ICD-10-CM | POA: Diagnosis present

## 2021-08-30 DIAGNOSIS — Z791 Long term (current) use of non-steroidal anti-inflammatories (NSAID): Secondary | ICD-10-CM

## 2021-08-30 DIAGNOSIS — J189 Pneumonia, unspecified organism: Principal | ICD-10-CM | POA: Diagnosis present

## 2021-08-30 DIAGNOSIS — J9621 Acute and chronic respiratory failure with hypoxia: Secondary | ICD-10-CM

## 2021-08-30 DIAGNOSIS — F419 Anxiety disorder, unspecified: Secondary | ICD-10-CM | POA: Diagnosis present

## 2021-08-30 DIAGNOSIS — G4730 Sleep apnea, unspecified: Secondary | ICD-10-CM | POA: Diagnosis present

## 2021-08-30 DIAGNOSIS — F32A Depression, unspecified: Secondary | ICD-10-CM | POA: Diagnosis present

## 2021-08-30 DIAGNOSIS — J439 Emphysema, unspecified: Secondary | ICD-10-CM | POA: Diagnosis present

## 2021-08-30 DIAGNOSIS — Z803 Family history of malignant neoplasm of breast: Secondary | ICD-10-CM

## 2021-08-30 DIAGNOSIS — G629 Polyneuropathy, unspecified: Secondary | ICD-10-CM | POA: Diagnosis present

## 2021-08-30 DIAGNOSIS — R7989 Other specified abnormal findings of blood chemistry: Secondary | ICD-10-CM

## 2021-08-30 DIAGNOSIS — Z170 Estrogen receptor positive status [ER+]: Secondary | ICD-10-CM

## 2021-08-30 DIAGNOSIS — Z20822 Contact with and (suspected) exposure to covid-19: Secondary | ICD-10-CM | POA: Diagnosis present

## 2021-08-30 DIAGNOSIS — Z7951 Long term (current) use of inhaled steroids: Secondary | ICD-10-CM

## 2021-08-30 DIAGNOSIS — D539 Nutritional anemia, unspecified: Secondary | ICD-10-CM

## 2021-08-30 DIAGNOSIS — Z8673 Personal history of transient ischemic attack (TIA), and cerebral infarction without residual deficits: Secondary | ICD-10-CM

## 2021-08-30 DIAGNOSIS — I3139 Other pericardial effusion (noninflammatory): Secondary | ICD-10-CM | POA: Diagnosis present

## 2021-08-30 DIAGNOSIS — R296 Repeated falls: Secondary | ICD-10-CM | POA: Diagnosis present

## 2021-08-30 DIAGNOSIS — Z79899 Other long term (current) drug therapy: Secondary | ICD-10-CM

## 2021-08-30 DIAGNOSIS — C50412 Malignant neoplasm of upper-outer quadrant of left female breast: Secondary | ICD-10-CM | POA: Diagnosis present

## 2021-08-30 DIAGNOSIS — M549 Dorsalgia, unspecified: Secondary | ICD-10-CM | POA: Diagnosis present

## 2021-08-30 DIAGNOSIS — F17210 Nicotine dependence, cigarettes, uncomplicated: Secondary | ICD-10-CM | POA: Diagnosis present

## 2021-08-30 DIAGNOSIS — Z9011 Acquired absence of right breast and nipple: Secondary | ICD-10-CM

## 2021-08-30 DIAGNOSIS — R600 Localized edema: Secondary | ICD-10-CM

## 2021-08-30 DIAGNOSIS — Z818 Family history of other mental and behavioral disorders: Secondary | ICD-10-CM

## 2021-08-30 DIAGNOSIS — G8929 Other chronic pain: Secondary | ICD-10-CM

## 2021-08-30 DIAGNOSIS — Z888 Allergy status to other drugs, medicaments and biological substances status: Secondary | ICD-10-CM

## 2021-08-30 DIAGNOSIS — I5031 Acute diastolic (congestive) heart failure: Secondary | ICD-10-CM | POA: Diagnosis present

## 2021-08-30 DIAGNOSIS — K219 Gastro-esophageal reflux disease without esophagitis: Secondary | ICD-10-CM | POA: Diagnosis present

## 2021-08-30 LAB — COMPREHENSIVE METABOLIC PANEL WITH GFR
ALT: 12 U/L (ref 0–44)
AST: 24 U/L (ref 15–41)
Albumin: 3.4 g/dL — ABNORMAL LOW (ref 3.5–5.0)
Alkaline Phosphatase: 103 U/L (ref 38–126)
Anion gap: 6 (ref 5–15)
BUN: 7 mg/dL — ABNORMAL LOW (ref 8–23)
CO2: 34 mmol/L — ABNORMAL HIGH (ref 22–32)
Calcium: 8.9 mg/dL (ref 8.9–10.3)
Chloride: 97 mmol/L — ABNORMAL LOW (ref 98–111)
Creatinine, Ser: 0.63 mg/dL (ref 0.44–1.00)
GFR, Estimated: >60 mL/min
Glucose, Bld: 97 mg/dL (ref 70–99)
Potassium: 4.1 mmol/L (ref 3.5–5.1)
Sodium: 137 mmol/L (ref 135–145)
Total Bilirubin: 0.8 mg/dL (ref 0.3–1.2)
Total Protein: 6 g/dL — ABNORMAL LOW (ref 6.5–8.1)

## 2021-08-30 LAB — CBC WITH DIFFERENTIAL/PLATELET
Abs Immature Granulocytes: 0.6 K/uL — ABNORMAL HIGH (ref 0.00–0.07)
Basophils Absolute: 1.3 K/uL — ABNORMAL HIGH (ref 0.0–0.1)
Basophils Relative: 7 %
Eosinophils Absolute: 0 K/uL (ref 0.0–0.5)
Eosinophils Relative: 0 %
HCT: 29.7 % — ABNORMAL LOW (ref 36.0–46.0)
Hemoglobin: 9 g/dL — ABNORMAL LOW (ref 12.0–15.0)
Lymphocytes Relative: 0 %
Lymphs Abs: 0 K/uL — ABNORMAL LOW (ref 0.7–4.0)
MCH: 30.7 pg (ref 26.0–34.0)
MCHC: 30.3 g/dL (ref 30.0–36.0)
MCV: 101.4 fL — ABNORMAL HIGH (ref 80.0–100.0)
Monocytes Absolute: 1.1 K/uL — ABNORMAL HIGH (ref 0.1–1.0)
Monocytes Relative: 6 %
Myelocytes: 3 %
Neutro Abs: 15.5 K/uL — ABNORMAL HIGH (ref 1.7–7.7)
Neutrophils Relative %: 84 %
Platelets: 110 K/uL — ABNORMAL LOW (ref 150–400)
RBC: 2.93 MIL/uL — ABNORMAL LOW (ref 3.87–5.11)
RDW: 14.7 % (ref 11.5–15.5)
Smear Review: DECREASED
WBC: 18.5 K/uL — ABNORMAL HIGH (ref 4.0–10.5)
nRBC: 0 % (ref 0.0–0.2)

## 2021-08-30 LAB — BRAIN NATRIURETIC PEPTIDE: B Natriuretic Peptide: 338.9 pg/mL — ABNORMAL HIGH (ref 0.0–100.0)

## 2021-08-30 LAB — LACTIC ACID, PLASMA: Lactic Acid, Venous: 0.8 mmol/L (ref 0.5–1.9)

## 2021-08-30 IMAGING — DX DG CHEST 1V PORT
1 series · 1 of 1 positions shown · non-contrast
Comparison: Chest and right rib series 4 days ago [DATE]. Chest
CT [DATE]

CLINICAL DATA: Shortness of breath and hypoxia. History of COPD,
emphysema, and breast cancer.

EXAM:
PORTABLE CHEST 1 VIEW

[chest ap]
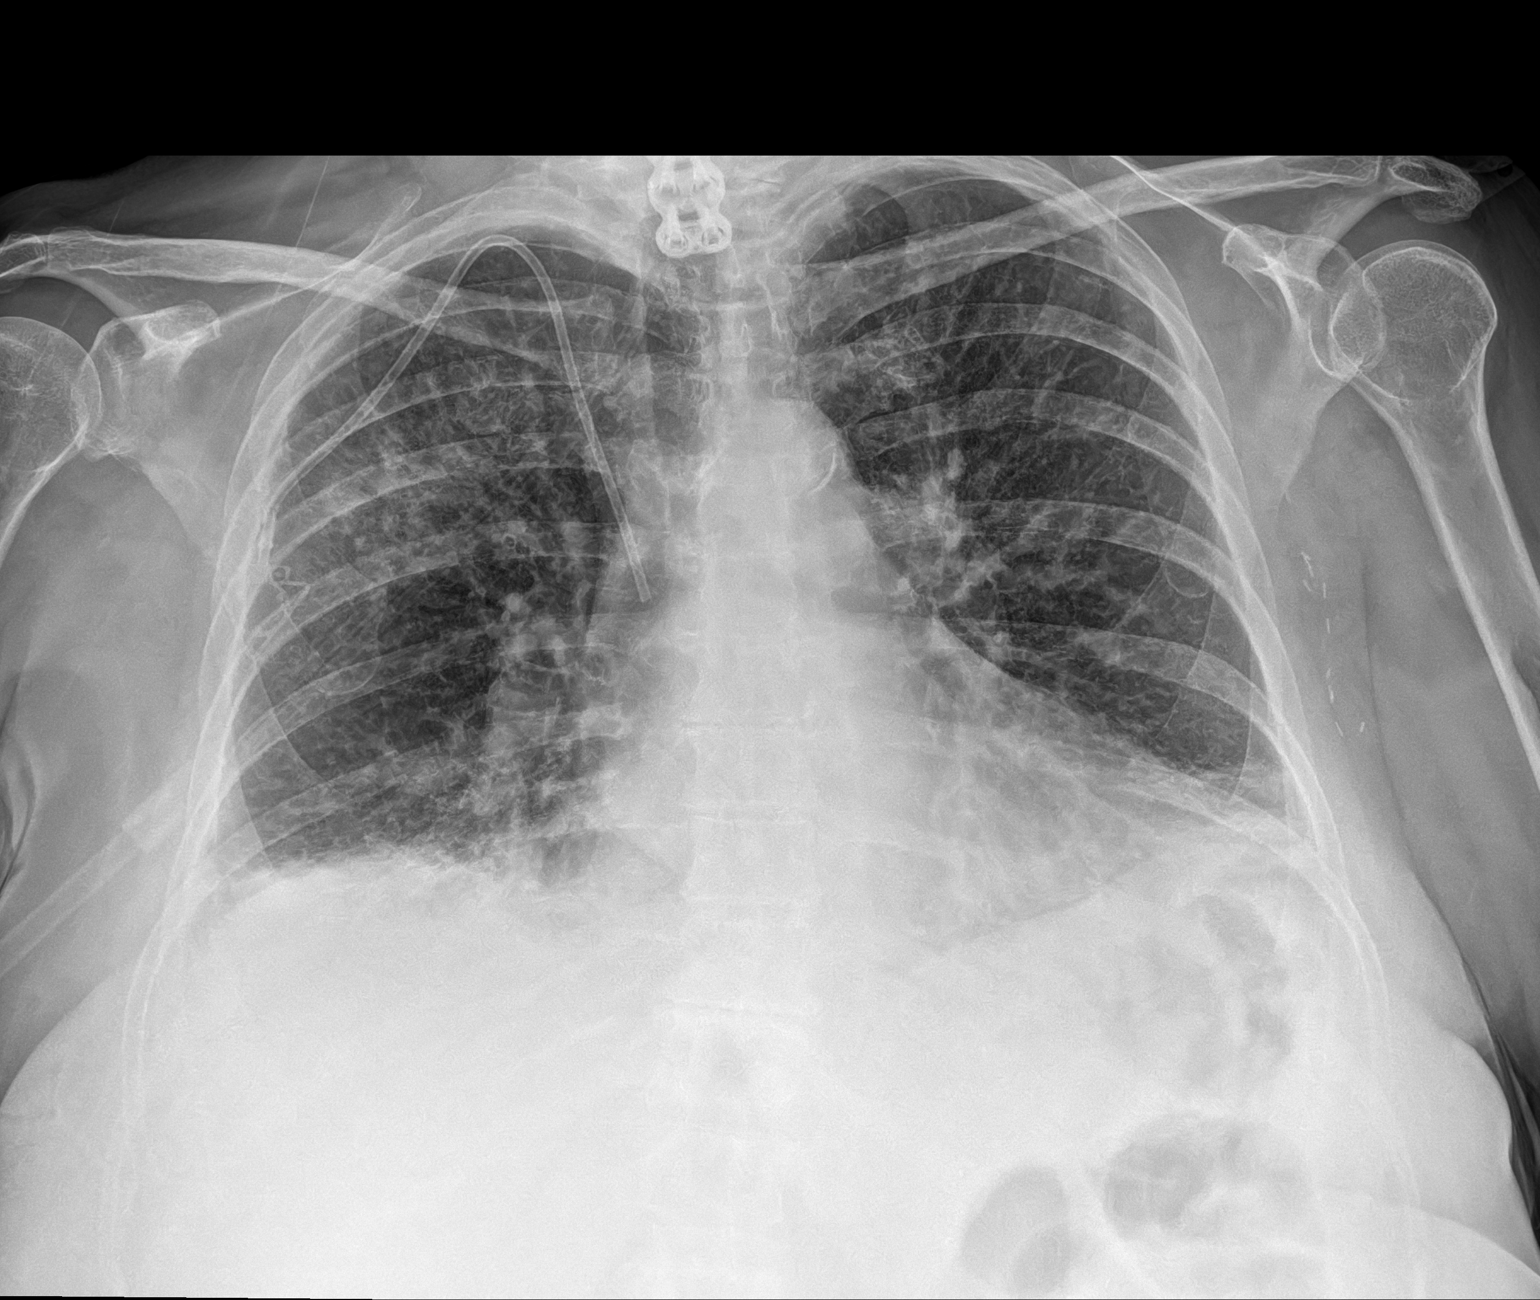

[1 of 1 positions shown; findings below may reference images not displayed]

FINDINGS: Right chest port remains in place. Stable heart size and mediastinal
contours. Aortic atherosclerosis. There is new patchy opacity in the
right upper lung zone. Increasing bibasilar atelectasis. Trace right
pleural effusion. No pneumothorax. Left axillary surgical clips. On
limited evaluation, no acute osseous abnormalities are seen.
IMPRESSION: New patchy opacity in the right upper lung zone, suspicious for
pneumonia. Trace right pleural effusion.

## 2021-08-30 MED ORDER — SODIUM CHLORIDE 0.9 % IV SOLN
500.0000 mg | Freq: Once | INTRAVENOUS | Status: AC
  Administered 2021-08-30: 500 mg via INTRAVENOUS
  Filled 2021-08-30: qty 5

## 2021-08-30 MED ORDER — METHYLPREDNISOLONE SODIUM SUCC 125 MG IJ SOLR
125.0000 mg | INTRAMUSCULAR | Status: AC
  Administered 2021-08-30: 125 mg via INTRAVENOUS
  Filled 2021-08-30: qty 2

## 2021-08-30 MED ORDER — LACTATED RINGERS IV SOLN: INTRAVENOUS | Status: DC

## 2021-08-30 MED ORDER — OXYCODONE-ACETAMINOPHEN 5-325 MG PO TABS
2.0000 | ORAL_TABLET | Freq: Once | ORAL | Status: AC
  Administered 2021-08-30: 2 via ORAL
  Filled 2021-08-30: qty 2

## 2021-08-30 MED ORDER — IPRATROPIUM-ALBUTEROL 0.5-2.5 (3) MG/3ML IN SOLN
3.0000 mL | Freq: Once | RESPIRATORY_TRACT | Status: AC
  Administered 2021-08-30: 3 mL via RESPIRATORY_TRACT
  Filled 2021-08-30: qty 3

## 2021-08-30 MED ORDER — ALBUTEROL SULFATE (2.5 MG/3ML) 0.083% IN NEBU
5.0000 mg | INHALATION_SOLUTION | Freq: Once | RESPIRATORY_TRACT | Status: AC
  Administered 2021-08-30: 5 mg via RESPIRATORY_TRACT
  Filled 2021-08-30: qty 6

## 2021-08-30 MED ORDER — IPRATROPIUM BROMIDE 0.02 % IN SOLN
0.5000 mg | RESPIRATORY_TRACT | Status: AC
  Administered 2021-08-30: 0.5 mg via RESPIRATORY_TRACT
  Filled 2021-08-30: qty 2.5

## 2021-08-30 MED ORDER — SODIUM CHLORIDE 0.9 % IV SOLN
1.0000 g | Freq: Once | INTRAVENOUS | Status: AC
  Administered 2021-08-30: 1 g via INTRAVENOUS
  Filled 2021-08-30: qty 10

## 2021-08-30 NOTE — ED Triage Notes (Signed)
Patient arrives POV with daughter states they were sent by PCP for oxygen saturation of 50% on RA. Patient states she was sitting at doctors when oxygen saturation was checked. Patient currently under treatment for breast cancer. Pt has oxygen tank and usually on 2.5 L [place]. Patient states she forgot her tubing to her oxygen. Patient having lower back pain and right sided rib cage pain onset of last week after a fall. Has lidocaine patch and nicotine patch on. Pt c/o shortness of breath with exertion "for a while"  ?

## 2021-08-30 NOTE — Progress Notes (Signed)
Patient presents for acute visit. ? ?Oxygen in office is 58. Patient reports dizziness and falls. ? ?She was not seen by me -- she was referred to the ER. Daughter was with patient and took her. ? ?Inda Coke PA-C ?

## 2021-08-30 NOTE — ED Notes (Signed)
Called [facility] to transport patient to [facility] rm# [id number] ?

## 2021-08-30 NOTE — ED Provider Notes (Signed)
?[facility] DEPT ?Provider Note ? ? ?CSN: [id number] ?Arrival date & time: 08/30/21  1517 ? ?  ? ?History ? ?Chief Complaint  ?Patient presents with  ? Shortness of Breath  ? ? ?Holly Phillips is a 66 y.o. female. ? ?66 year old female with history of breast cancer and COPD who is normally on 2.5 L of oxygen presents with increased shortness of breath.  Patient was at a doctor's visit today and did not bring her oxygen tubing.  O2 sats were found to be in the 50s.  Patient states that she has had slight increasing cough recently.  Denies any fever or chills.  No chest pain or chest pressure.  Patient is supposed to be on home nebulizer butyryl treatments which she has not been doing.  States her cough is been nonproductive.  Denies any leg pain or swelling felt better after her oxygen was restored ? ? ?  ? ?Home Medications ?Prior to Admission medications   ?Medication Sig Start Date End Date Taking? Authorizing Provider  ?albuterol (PROVENTIL) (2.5 MG/3ML) 0.083% nebulizer solution Take 3 mLs (2.5 mg total) by nebulization every 6 (six) hours as needed for wheezing or shortness of breath. 02/06/21   Inda Coke, PA  ?albuterol (VENTOLIN HFA) 108 (90 Base) MCG/ACT inhaler INHALE 1 TO 2 PUFFS INTO THE LUNGS EVERY 6 HOURS AS NEEDED FOR WHEEZING OR SHORTNESS OF BREATH 04/30/21   Inda Coke, PA  ?atorvastatin (LIPITOR) 20 MG tablet Take 1 tablet (20 mg total) by mouth daily. 02/08/21   Inda Coke, PA  ?budesonide-formoterol [facility]) 160-4.5 MCG/ACT inhaler Inhale 2 puffs into the lungs 2 (two) times daily. 08/24/20   Chesley Mires, MD  ?celecoxib (CELEBREX) 100 MG capsule TAKE 1 CAPSULE(100 MG) BY MOUTH TWICE DAILY 08/22/21   Vivi Barrack, MD  ?cyclobenzaprine (FLEXERIL) 10 MG tablet Take 1 tablet (10 mg total) by mouth 3 (three) times daily. 08/01/19   Inda Coke, PA  ?CYCLOPHOSPHAMIDE IV Inject into the vein every 21 ( twenty-one) days. X 4 cycles 08/05/21   [provider]  ?DOCETAXEL IV Inject into the vein every 21 ( twenty-one) days. X 4 cycles 08/05/21   [provider]  ?escitalopram (LEXAPRO) 20 MG tablet TAKE 1 TABLET(20 MG) BY MOUTH DAILY 08/22/21   Vivi Barrack, MD  ?furosemide (LASIX) 20 MG tablet Take 20 mg by mouth daily as needed for fluid.    [provider]  ?gabapentin (NEURONTIN) 600 MG tablet Take 1 tablet in AM, 1 tablet at noon, and 2 tablets at bedtime. 08/16/21   Inda Coke, PA  ?lidocaine (LIDODERM) 5 % Place 3 patches onto the skin daily as needed (pain). 01/16/20   [provider]  ?lidocaine-prilocaine (EMLA) cream Apply a small amount to port a cath site (do not rub in) and cover with plastic wrap 1 hour prior to chemotherapy appointments 08/01/21   Derek Jack, MD  ?loperamide (IMODIUM) 2 MG capsule TAKE 1 CAPSULE BY MOUTH DAILY AS NEEDED FOR DIARRHEA OR LOOSE STOOLS. 08/12/21   Tarri Abernethy M, PA-C  ?magnesium oxide (MAG-OX) 400 (240 Mg) MG tablet Take 1 tablet (400 mg total) by mouth in the morning, at noon, and at bedtime. 08/05/21   Derek Jack, MD  ?montelukast (SINGULAIR) 10 MG tablet TAKE 1 TABLET(10 MG) BY MOUTH AT BEDTIME 08/22/21   Vivi Barrack, MD  ?naloxone [facility]) nasal spray 4 mg/0.1 mL SMARTSIG:Both Nares ?Patient not taking: Reported on 08/30/2021 08/28/21   [provider]  ?  omeprazole (PRILOSEC) 40 MG capsule Take 1 capsule (40 mg total) by mouth 2 (two) times daily. 08/01/19   Inda Coke, PA  ?ondansetron (ZOFRAN) 4 MG tablet Take 1 tablet (4 mg total) by mouth every 8 (eight) hours as needed. 05/31/21 05/31/22  Virl Cagey, MD  ?oxyCODONE-acetaminophen (PERCOCET) 10-325 MG tablet Take 1 tablet by mouth 6 (six) times daily. 06/08/19   [provider]  ?predniSONE (DELTASONE) 20 MG tablet Take 2 tablets (40 mg total) by mouth daily. 02/06/21   Inda Coke, PA  ?prochlorperazine (COMPAZINE) 10 MG tablet Take 1 tablet (10 mg total) by mouth  every 6 (six) hours as needed (Nausea or vomiting). 08/01/21   Derek Jack, MD  ?Tiotropium Bromide Monohydrate (SPIRIVA RESPIMAT) 2.5 MCG/ACT AERS Inhale 2 puffs into the lungs daily. 05/27/21   Inda Coke, PA  ?tiZANidine (ZANAFLEX) 4 MG tablet Take 1 tablet (4 mg total) by mouth 3 (three) times daily. 06/25/21   Inda Coke, PA  ?traZODone (DESYREL) 100 MG tablet TAKE 2 TABLETS(200 MG) BY MOUTH AT BEDTIME 08/22/21   Vivi Barrack, MD  ?   ? ?Allergies    ?Mucinex [guaifenesin er]   ? ?Review of Systems   ?Review of Systems  ?All other systems reviewed and are negative. ? ?Physical Exam ?Updated Vital Signs ?BP 136/61 (BP Location: Left Arm)   Pulse (!) 107   Temp 99.4 ?F (37.4 ?C)   Resp 20   Ht 1.524 m (5')   Wt 70.3 kg   LMP  (LMP Unknown)   SpO2 90%   BMI 30.27 kg/m?  ?Physical Exam ?Vitals and nursing note reviewed.  ?Constitutional:   ?   General: She is not in acute distress. ?   Appearance: Normal appearance. She is well-developed. She is not toxic-appearing.  ?HENT:  ?   Head: Normocephalic and atraumatic.  ?Eyes:  ?   General: Lids are normal.  ?   Conjunctiva/sclera: Conjunctivae normal.  ?   Pupils: Pupils are equal, round, and reactive to light.  ?Neck:  ?   Thyroid: No thyroid mass.  ?   Trachea: No tracheal deviation.  ?Cardiovascular:  ?   Rate and Rhythm: Normal rate and regular rhythm.  ?   Heart sounds: Normal heart sounds. No murmur heard. ?  No gallop.  ?Pulmonary:  ?   Effort: Respiratory distress present.  ?   Breath sounds: No stridor. Examination of the right-upper field reveals decreased breath sounds and wheezing. Examination of the left-upper field reveals decreased breath sounds and wheezing. Decreased breath sounds and wheezing present. No rhonchi or rales.  ?Abdominal:  ?   General: There is no distension.  ?   Palpations: Abdomen is soft.  ?   Tenderness: There is no abdominal tenderness. There is no rebound.  ?Musculoskeletal:     ?   General: No tenderness.  Normal range of motion.  ?   Cervical back: Normal range of motion and neck supple.  ?Skin: ?   General: Skin is warm and dry.  ?   Findings: No abrasion or rash.  ?Neurological:  ?   Mental Status: She is alert and oriented to person, place, and time. Mental status is at baseline.  ?   GCS: GCS eye subscore is 4. GCS verbal subscore is 5. GCS motor subscore is 6.  ?   Cranial Nerves: No cranial nerve deficit.  ?   Sensory: No sensory deficit.  ?   Motor: Motor function is intact.  ?  Psychiatric:     ?   Attention and Perception: Attention normal.     ?   Speech: Speech normal.     ?   Behavior: Behavior normal.  ? ? ?ED Results / Procedures / Treatments   ?Labs ?(all labs ordered are listed, but only abnormal results are displayed) ?Labs Reviewed  ?CBC WITH DIFFERENTIAL/PLATELET - Abnormal; Notable for the following components:  ?    Result Value  ? WBC 18.5 (*)   ? RBC 2.93 (*)   ? Hemoglobin 9.0 (*)   ? HCT 29.7 (*)   ? MCV 101.4 (*)   ? Platelets 110 (*)   ? All other components within normal limits  ?COMPREHENSIVE METABOLIC PANEL  ?BRAIN NATRIURETIC PEPTIDE  ? ? ?EKG ?None ? ?Radiology ?No results found. ? ?Procedures ?Procedures  ? ? ?Medications Ordered in ED ?Medications  ?lactated ringers infusion (has no administration in time range)  ?albuterol (PROVENTIL) (2.5 MG/3ML) 0.083% nebulizer solution 5 mg (has no administration in time range)  ?ipratropium (ATROVENT) nebulizer solution 0.5 mg (has no administration in time range)  ?methylPREDNISolone sodium succinate (SOLU-MEDROL) 125 mg/2 mL injection 125 mg (has no administration in time range)  ?ipratropium-albuterol (DUONEB) 0.5-2.5 (3) MG/3ML nebulizer solution 3 mL (3 mLs Nebulization Given 08/30/21 1546)  ? ? ?ED Course/ Medical Decision Making/ A&P ?  ?                        ?Medical Decision Making ?Amount and/or Complexity of Data Reviewed ?Labs: ordered. ?Radiology: ordered. ? ?Risk ?Prescription drug management. ? ? ?Patient presented with shortness  of breath and wheezing.  Has history of COPD and was given albuterol along with Solu-Medrol and Atrovent.  Wheezing slightly and proved.  Chest ray per my review and interpretation shows new opacity in the right u

## 2021-08-30 NOTE — ED Notes (Addendum)
Patient resting comfortably

## 2021-08-30 NOTE — ED Notes (Signed)
Attempted to get a 2nd set of Blood Cultures out of Pt's Right Hand. Was unsuccessful getting blood return. Pt's Left Arm is restricted. Medic aware. Charge RN aware. Charge RN notified Medic to go ahead and start Antibiotics.  ?

## 2021-08-30 NOTE — ED Notes (Signed)
Bedside report received -- this nurse has assumed care.  Pt initially sitting up in bed eating dinner -- GCS 15.  Reports sob has improved since arrival -- pt remains on 6L humidified O2 via  with continuous cardiac and pulse ox monitoring  maintained.  Pt with intermittent mild prod cough and improving R anterior rib pain since receiving PO pain med earlier -- reports pain level now 5/10.  Pt agreeable with plan for admission and does request that home meds be addressed by hospitalist as soon as possible- explained to patient admit orders still pending; pt acknowledges verbal understanding -- after finishing dinner pt requested and provided ice cream.  Denies any other immediate needs, questions, concerns at this time.  Will monitor for acute changes and maintain plan of care.   ?

## 2021-08-31 ENCOUNTER — Inpatient Hospital Stay (HOSPITAL_COMMUNITY): Payer: Medicare Other

## 2021-08-31 DIAGNOSIS — D539 Nutritional anemia, unspecified: Secondary | ICD-10-CM

## 2021-08-31 DIAGNOSIS — K219 Gastro-esophageal reflux disease without esophagitis: Secondary | ICD-10-CM | POA: Diagnosis present

## 2021-08-31 DIAGNOSIS — M549 Dorsalgia, unspecified: Secondary | ICD-10-CM | POA: Diagnosis present

## 2021-08-31 DIAGNOSIS — Z888 Allergy status to other drugs, medicaments and biological substances status: Secondary | ICD-10-CM | POA: Diagnosis not present

## 2021-08-31 DIAGNOSIS — J9621 Acute and chronic respiratory failure with hypoxia: Secondary | ICD-10-CM | POA: Diagnosis present

## 2021-08-31 DIAGNOSIS — J189 Pneumonia, unspecified organism: Secondary | ICD-10-CM | POA: Diagnosis present

## 2021-08-31 DIAGNOSIS — Z8673 Personal history of transient ischemic attack (TIA), and cerebral infarction without residual deficits: Secondary | ICD-10-CM | POA: Diagnosis not present

## 2021-08-31 DIAGNOSIS — G4730 Sleep apnea, unspecified: Secondary | ICD-10-CM | POA: Diagnosis present

## 2021-08-31 DIAGNOSIS — J439 Emphysema, unspecified: Secondary | ICD-10-CM | POA: Diagnosis present

## 2021-08-31 DIAGNOSIS — R0602 Shortness of breath: Secondary | ICD-10-CM

## 2021-08-31 DIAGNOSIS — Z803 Family history of malignant neoplasm of breast: Secondary | ICD-10-CM | POA: Diagnosis not present

## 2021-08-31 DIAGNOSIS — C50412 Malignant neoplasm of upper-outer quadrant of left female breast: Secondary | ICD-10-CM | POA: Diagnosis present

## 2021-08-31 DIAGNOSIS — I5030 Unspecified diastolic (congestive) heart failure: Secondary | ICD-10-CM

## 2021-08-31 DIAGNOSIS — Z79899 Other long term (current) drug therapy: Secondary | ICD-10-CM | POA: Diagnosis not present

## 2021-08-31 DIAGNOSIS — I5031 Acute diastolic (congestive) heart failure: Secondary | ICD-10-CM | POA: Diagnosis present

## 2021-08-31 DIAGNOSIS — I3139 Other pericardial effusion (noninflammatory): Secondary | ICD-10-CM | POA: Diagnosis present

## 2021-08-31 DIAGNOSIS — Z20822 Contact with and (suspected) exposure to covid-19: Secondary | ICD-10-CM | POA: Diagnosis present

## 2021-08-31 DIAGNOSIS — R296 Repeated falls: Secondary | ICD-10-CM | POA: Diagnosis present

## 2021-08-31 DIAGNOSIS — R600 Localized edema: Secondary | ICD-10-CM

## 2021-08-31 DIAGNOSIS — G8929 Other chronic pain: Secondary | ICD-10-CM

## 2021-08-31 DIAGNOSIS — E785 Hyperlipidemia, unspecified: Secondary | ICD-10-CM | POA: Diagnosis present

## 2021-08-31 DIAGNOSIS — R7989 Other specified abnormal findings of blood chemistry: Secondary | ICD-10-CM

## 2021-08-31 DIAGNOSIS — G629 Polyneuropathy, unspecified: Secondary | ICD-10-CM | POA: Diagnosis present

## 2021-08-31 DIAGNOSIS — F32A Depression, unspecified: Secondary | ICD-10-CM | POA: Diagnosis present

## 2021-08-31 DIAGNOSIS — F17210 Nicotine dependence, cigarettes, uncomplicated: Secondary | ICD-10-CM | POA: Diagnosis present

## 2021-08-31 DIAGNOSIS — F419 Anxiety disorder, unspecified: Secondary | ICD-10-CM | POA: Diagnosis present

## 2021-08-31 DIAGNOSIS — Z9011 Acquired absence of right breast and nipple: Secondary | ICD-10-CM | POA: Diagnosis not present

## 2021-08-31 LAB — CBC
HCT: 32.8 % — ABNORMAL LOW (ref 36.0–46.0)
Hemoglobin: 10.3 g/dL — ABNORMAL LOW (ref 12.0–15.0)
MCH: 32.1 pg (ref 26.0–34.0)
MCHC: 31.4 g/dL (ref 30.0–36.0)
MCV: 102.2 fL — ABNORMAL HIGH (ref 80.0–100.0)
Platelets: 73 10*3/uL — ABNORMAL LOW (ref 150–400)
RBC: 3.21 MIL/uL — ABNORMAL LOW (ref 3.87–5.11)
RDW: 14.4 % (ref 11.5–15.5)
WBC: 14.1 10*3/uL — ABNORMAL HIGH (ref 4.0–10.5)
nRBC: 0.1 % (ref 0.0–0.2)

## 2021-08-31 LAB — ECHOCARDIOGRAM COMPLETE
AR max vel: 2.8 cm2
AV Peak grad: 9.9 mmHg
Ao pk vel: 1.57 m/s
Area-P 1/2: 4.54 cm2
Calc EF: 62.1 %
Height: 60 in
MV VTI: 2.91 cm2
S' Lateral: 2.3 cm
Single Plane A2C EF: 60.4 %
Single Plane A4C EF: 62.3 %
Weight: 2480 oz

## 2021-08-31 LAB — COMPREHENSIVE METABOLIC PANEL
ALT: 18 U/L (ref 0–44)
AST: 27 U/L (ref 15–41)
Albumin: 3.2 g/dL — ABNORMAL LOW (ref 3.5–5.0)
Alkaline Phosphatase: 113 U/L (ref 38–126)
Anion gap: 10 (ref 5–15)
BUN: 7 mg/dL — ABNORMAL LOW (ref 8–23)
CO2: 28 mmol/L (ref 22–32)
Calcium: 9.1 mg/dL (ref 8.9–10.3)
Chloride: 104 mmol/L (ref 98–111)
Creatinine, Ser: 0.49 mg/dL (ref 0.44–1.00)
GFR, Estimated: >60 mL/min (ref >60)
Glucose, Bld: 150 mg/dL — ABNORMAL HIGH (ref 70–99)
Potassium: 5 mmol/L (ref 3.5–5.1)
Sodium: 142 mmol/L (ref 135–145)
Total Bilirubin: 0.6 mg/dL (ref 0.3–1.2)
Total Protein: 6.7 g/dL (ref 6.5–8.1)

## 2021-08-31 LAB — CREATININE, SERUM
Creatinine, Ser: 0.53 mg/dL (ref 0.44–1.00)
GFR, Estimated: >60 mL/min (ref >60)

## 2021-08-31 LAB — RESP PANEL BY RT-PCR (FLU A&B, COVID) ARPGX2
Influenza A by PCR: NEGATIVE
Influenza B by PCR: NEGATIVE
SARS Coronavirus 2 by RT PCR: NEGATIVE

## 2021-08-31 LAB — HIV ANTIBODY (ROUTINE TESTING W REFLEX): HIV Screen 4th Generation wRfx: NONREACTIVE

## 2021-08-31 LAB — MAGNESIUM: Magnesium: 2.2 mg/dL (ref 1.7–2.4)

## 2021-08-31 MED ORDER — GABAPENTIN 300 MG PO CAPS
600.0000 mg | ORAL_CAPSULE | Freq: Every day | ORAL | Status: DC
  Administered 2021-08-31 – 2021-09-01 (×2): 600 mg via ORAL
  Filled 2021-08-31 (×2): qty 2

## 2021-08-31 MED ORDER — LIDOCAINE 5 % EX PTCH
3.0000 | MEDICATED_PATCH | CUTANEOUS | Status: DC
  Administered 2021-08-31 – 2021-09-02 (×3): 3 via TRANSDERMAL
  Filled 2021-08-31 (×3): qty 3

## 2021-08-31 MED ORDER — OXYCODONE-ACETAMINOPHEN 5-325 MG PO TABS
1.0000 | ORAL_TABLET | Freq: Once | ORAL | Status: AC
  Administered 2021-08-31: 1 via ORAL
  Filled 2021-08-31: qty 1

## 2021-08-31 MED ORDER — NICOTINE 14 MG/24HR TD PT24
14.0000 mg | MEDICATED_PATCH | Freq: Every day | TRANSDERMAL | Status: DC
  Administered 2021-08-31 – 2021-09-02 (×3): 14 mg via TRANSDERMAL
  Filled 2021-08-31 (×3): qty 1

## 2021-08-31 MED ORDER — UMECLIDINIUM BROMIDE 62.5 MCG/ACT IN AEPB
1.0000 | INHALATION_SPRAY | Freq: Every day | RESPIRATORY_TRACT | Status: DC
Start: 2021-08-31 — End: 2021-09-02
  Administered 2021-08-31 – 2021-09-02 (×3): 1 via RESPIRATORY_TRACT
  Filled 2021-08-31: qty 7

## 2021-08-31 MED ORDER — MAGNESIUM OXIDE -MG SUPPLEMENT 400 (240 MG) MG PO TABS
400.0000 mg | ORAL_TABLET | Freq: Two times a day (BID) | ORAL | Status: DC
  Administered 2021-08-31 – 2021-09-01 (×4): 400 mg via ORAL
  Filled 2021-08-31 (×4): qty 1

## 2021-08-31 MED ORDER — AZITHROMYCIN 250 MG PO TABS
500.0000 mg | ORAL_TABLET | Freq: Every day | ORAL | Status: DC
  Administered 2021-08-31 – 2021-09-02 (×3): 500 mg via ORAL
  Filled 2021-08-31 (×3): qty 2

## 2021-08-31 MED ORDER — ALBUTEROL SULFATE (2.5 MG/3ML) 0.083% IN NEBU
2.5000 mg | INHALATION_SOLUTION | Freq: Four times a day (QID) | RESPIRATORY_TRACT | Status: DC | PRN
  Administered 2021-08-31: 2.5 mg via RESPIRATORY_TRACT
  Filled 2021-08-31: qty 3

## 2021-08-31 MED ORDER — ONDANSETRON HCL 4 MG/2ML IJ SOLN: 4.0000 mg | Freq: Four times a day (QID) | INTRAMUSCULAR | Status: DC | PRN

## 2021-08-31 MED ORDER — TRAZODONE HCL 50 MG PO TABS
200.0000 mg | ORAL_TABLET | Freq: Every day | ORAL | Status: DC
  Administered 2021-08-31 – 2021-09-01 (×2): 200 mg via ORAL
  Filled 2021-08-31 (×2): qty 4

## 2021-08-31 MED ORDER — TIOTROPIUM BROMIDE MONOHYDRATE 2.5 MCG/ACT IN AERS: 2.0000 | INHALATION_SPRAY | Freq: Every day | RESPIRATORY_TRACT | Status: DC

## 2021-08-31 MED ORDER — ALBUTEROL SULFATE HFA 108 (90 BASE) MCG/ACT IN AERS
1.0000 | INHALATION_SPRAY | Freq: Four times a day (QID) | RESPIRATORY_TRACT | Status: DC | PRN
Start: 2021-08-31 — End: 2021-08-31

## 2021-08-31 MED ORDER — MONTELUKAST SODIUM 10 MG PO TABS
10.0000 mg | ORAL_TABLET | Freq: Every day | ORAL | Status: DC
  Administered 2021-08-31 – 2021-09-01 (×2): 10 mg via ORAL
  Filled 2021-08-31 (×2): qty 1

## 2021-08-31 MED ORDER — TIZANIDINE HCL 4 MG PO TABS
4.0000 mg | ORAL_TABLET | Freq: Three times a day (TID) | ORAL | Status: DC
  Administered 2021-08-31 – 2021-09-02 (×7): 4 mg via ORAL
  Filled 2021-08-31 (×7): qty 1

## 2021-08-31 MED ORDER — GABAPENTIN 400 MG PO CAPS
1200.0000 mg | ORAL_CAPSULE | Freq: Every day | ORAL | Status: DC
  Administered 2021-08-31 – 2021-09-01 (×2): 1200 mg via ORAL
  Filled 2021-08-31 (×2): qty 3

## 2021-08-31 MED ORDER — ACETAMINOPHEN 650 MG RE SUPP
650.0000 mg | Freq: Four times a day (QID) | RECTAL | Status: DC | PRN
Start: 2021-08-31 — End: 2021-09-02

## 2021-08-31 MED ORDER — IPRATROPIUM-ALBUTEROL 0.5-2.5 (3) MG/3ML IN SOLN: 3.0000 mL | Freq: Four times a day (QID) | RESPIRATORY_TRACT | Status: DC | PRN

## 2021-08-31 MED ORDER — SODIUM CHLORIDE 0.9 % IV SOLN
2.0000 g | INTRAVENOUS | Status: DC
  Administered 2021-08-31 – 2021-09-01 (×2): 2 g via INTRAVENOUS
  Filled 2021-08-31 (×3): qty 20

## 2021-08-31 MED ORDER — CYCLOBENZAPRINE HCL 10 MG PO TABS
10.0000 mg | ORAL_TABLET | Freq: Three times a day (TID) | ORAL | Status: DC
  Administered 2021-08-31 – 2021-09-02 (×7): 10 mg via ORAL
  Filled 2021-08-31 (×7): qty 1

## 2021-08-31 MED ORDER — GABAPENTIN 300 MG PO CAPS
600.0000 mg | ORAL_CAPSULE | Freq: Every day | ORAL | Status: DC
  Administered 2021-09-01 – 2021-09-02 (×2): 600 mg via ORAL
  Filled 2021-08-31 (×2): qty 2

## 2021-08-31 MED ORDER — PANTOPRAZOLE SODIUM 40 MG PO TBEC
40.0000 mg | DELAYED_RELEASE_TABLET | Freq: Two times a day (BID) | ORAL | Status: DC
  Administered 2021-08-31 – 2021-09-02 (×5): 40 mg via ORAL
  Filled 2021-08-31 (×5): qty 1

## 2021-08-31 MED ORDER — OXYCODONE-ACETAMINOPHEN 5-325 MG PO TABS
1.0000 | ORAL_TABLET | Freq: Four times a day (QID) | ORAL | Status: DC | PRN
Start: 2021-08-31 — End: 2021-09-02
  Administered 2021-08-31 – 2021-09-02 (×6): 1 via ORAL
  Filled 2021-08-31 (×6): qty 1

## 2021-08-31 MED ORDER — GABAPENTIN 600 MG PO TABS: 600.0000 mg | ORAL_TABLET | ORAL | Status: DC

## 2021-08-31 MED ORDER — ATORVASTATIN CALCIUM 20 MG PO TABS
20.0000 mg | ORAL_TABLET | Freq: Every day | ORAL | Status: DC
  Administered 2021-08-31 – 2021-09-02 (×3): 20 mg via ORAL
  Filled 2021-08-31 (×3): qty 1

## 2021-08-31 MED ORDER — ACETAMINOPHEN 325 MG PO TABS: 650.0000 mg | ORAL_TABLET | Freq: Four times a day (QID) | ORAL | Status: DC | PRN

## 2021-08-31 MED ORDER — MOMETASONE FURO-FORMOTEROL FUM 200-5 MCG/ACT IN AERO
2.0000 | INHALATION_SPRAY | Freq: Two times a day (BID) | RESPIRATORY_TRACT | Status: DC
  Administered 2021-08-31 – 2021-09-02 (×5): 2 via RESPIRATORY_TRACT
  Filled 2021-08-31: qty 8.8

## 2021-08-31 MED ORDER — ONDANSETRON HCL 4 MG PO TABS
4.0000 mg | ORAL_TABLET | Freq: Four times a day (QID) | ORAL | Status: DC | PRN
Start: 2021-08-31 — End: 2021-09-02

## 2021-08-31 MED ORDER — FUROSEMIDE 40 MG PO TABS
40.0000 mg | ORAL_TABLET | Freq: Every day | ORAL | Status: DC
  Administered 2021-08-31 – 2021-09-01 (×2): 40 mg via ORAL
  Filled 2021-08-31 (×2): qty 1

## 2021-08-31 MED ORDER — ESCITALOPRAM OXALATE 20 MG PO TABS
20.0000 mg | ORAL_TABLET | Freq: Every day | ORAL | Status: DC
  Administered 2021-08-31 – 2021-09-02 (×3): 20 mg via ORAL
  Filled 2021-08-31 (×3): qty 1

## 2021-08-31 MED ORDER — OXYCODONE HCL 5 MG PO TABS
5.0000 mg | ORAL_TABLET | Freq: Four times a day (QID) | ORAL | Status: DC | PRN
  Administered 2021-08-31 – 2021-09-02 (×6): 5 mg via ORAL
  Filled 2021-08-31 (×6): qty 1

## 2021-08-31 MED ORDER — CELECOXIB 100 MG PO CAPS
100.0000 mg | ORAL_CAPSULE | Freq: Every day | ORAL | Status: DC
  Administered 2021-08-31 – 2021-09-02 (×3): 100 mg via ORAL
  Filled 2021-08-31 (×3): qty 1

## 2021-08-31 MED ORDER — ENOXAPARIN SODIUM 40 MG/0.4ML IJ SOSY
40.0000 mg | PREFILLED_SYRINGE | INTRAMUSCULAR | Status: DC
  Administered 2021-08-31 – 2021-09-02 (×3): 40 mg via SUBCUTANEOUS
  Filled 2021-08-31 (×3): qty 0.4

## 2021-08-31 MED ORDER — BENZONATATE 100 MG PO CAPS
200.0000 mg | ORAL_CAPSULE | Freq: Three times a day (TID) | ORAL | Status: DC | PRN
  Administered 2021-08-31 – 2021-09-01 (×3): 200 mg via ORAL
  Filled 2021-08-31 (×3): qty 2

## 2021-08-31 NOTE — Progress Notes (Signed)
Flutter valve given to patient per MD order.  ?

## 2021-08-31 NOTE — H&P (Signed)
?History and Physical  ? ? ?PatientCamera Phillips BXU:[id number] DOB: [birth year] ?DOA: 08/30/2021 ?DOS: the patient was seen and examined on 08/31/2021 ?PCP: Inda Coke, PA  ?Patient coming from: Home ? ?Chief Complaint:  ?Chief Complaint  ?Patient presents with  ? Shortness of Breath  ? ?HPI: Holly Phillips is a 66 y.o. female with medical history significant of tobacco abuse, chronic back, COPD on PRN/nightly O2, left breast cancer, HLD. Presenting with dyspnea. She reports that she's been having increased shortness of breath over the last 5 days. She typically uses supplemental O2 on a PRN basis, but she's had to use it constantly over the last 5 days. She denies fever. She reports increased cough. She denies sick contacts. She visited her PCP yesterday with complaints of dizziness and falls. When she was examined, it was noted that her SpO2 was in the 50s. It was recommended that she go to the ED for evaluation.  ? ?Of note, she reports multiple falls over the last week and increased BLE swelling. She denies history of HF. She reports that her swelling improves with elevation of her legs. She denies any other aggravating or alleviating factors.  ? ?Review of Systems: As mentioned in the history of present illness. All other systems reviewed and are negative. ?Past Medical History:  ?Diagnosis Date  ? Anxiety   ? Arthritis   ? Asthma   ? Chronic back pain   ? Colon polyps   ? COPD (chronic obstructive pulmonary disease) ([place])   ? Depression   ? Emphysema of lung ([place])   ? Family history of breast cancer   ? GERD (gastroesophageal reflux disease)   ? Memory change   ? Port-A-Cath in place 08/01/2021  ? Sleep apnea   ? TIA (transient ischemic attack)   ? Tremor   ? ?Past Surgical History:  ?Procedure Laterality Date  ? ABDOMINAL HYSTERECTOMY    ? BREAST LUMPECTOMY WITH RADIOFREQUENCY TAG IDENTIFICATION Left 1/91/6606  ? Procedure: BREAST LUMPECTOMY WITH RADIOFREQUENCY TAG IDENTIFICATION;  Surgeon:  Virl Cagey, MD;  Location: [facility];  Service: General;  Laterality: Left;  ? CERVICAL SPINE SURGERY    ? INNER EAR SURGERY    ? LUMBAR FUSION  2012  ? L3-L7  ? PARTIAL MASTECTOMY WITH AXILLARY SENTINEL LYMPH NODE BIOPSY Left 05/31/2021  ? Procedure: PARTIAL MASTECTOMY WITH AXILLARY SENTINEL LYMPH NODE BIOPSY;  Surgeon: Virl Cagey, MD;  Location: [facility];  Service: General;  Laterality: Left;  ? PORTACATH PLACEMENT Right 07/30/2021  ? Procedure: INSERTION PORT-A-CATH;  Surgeon: Virl Cagey, MD;  Location: [facility];  Service: General;  Laterality: Right;  ? ?Social History:  reports that she has been smoking cigarettes. She has been smoking an average of .5 packs per day. She has never used smokeless tobacco. She reports that she does not currently use alcohol. She reports that she does not currently use drugs after having used the following drugs: Other-see comments. ? ?Allergies  ?Allergen Reactions  ? Mucinex [Guaifenesin Er] Other (See Comments)  ?  Jerky movements  ? ? ?Family History  ?Problem Relation Age of Onset  ? Depression Mother   ? Diabetes Mother   ? Hypertension Mother   ? Hyperlipidemia Mother   ? Heart attack Mother   ? Osteoarthritis Father   ? Asthma Father   ? COPD Father   ? Breast cancer Sister   ?     dx 58s-60s  ? Lupus Sister   ? Osteoarthritis Sister   ?  Asthma Sister   ? COPD Sister   ? Diabetes Sister   ? Drug abuse Sister   ? Heart attack Sister   ? Heart attack Maternal Grandmother   ? Colon cancer Neg Hx   ? Esophageal cancer Neg Hx   ? ? ?Prior to Admission medications   ?Medication Sig Start Date End Date Taking? Authorizing Provider  ?albuterol (PROVENTIL) (2.5 MG/3ML) 0.083% nebulizer solution Take 3 mLs (2.5 mg total) by nebulization every 6 (six) hours as needed for wheezing or shortness of breath. 02/06/21  Yes Inda Coke, PA  ?albuterol (VENTOLIN HFA) 108 (90 Base) MCG/ACT inhaler INHALE 1 TO 2 PUFFS INTO THE LUNGS EVERY 6 HOURS AS NEEDED FOR WHEEZING OR  SHORTNESS OF BREATH ?Patient taking differently: 1-2 puffs every 6 (six) hours as needed for wheezing or shortness of breath. 04/30/21  Yes Inda Coke, PA  ?atorvastatin (LIPITOR) 20 MG tablet Take 1 tablet (20 mg total) by mouth daily. 02/08/21  Yes Inda Coke, PA  ?budesonide-formoterol (SYMBICORT) 160-4.5 MCG/ACT inhaler Inhale 2 puffs into the lungs 2 (two) times daily. 08/24/20  Yes Chesley Mires, MD  ?celecoxib (CELEBREX) 100 MG capsule TAKE 1 CAPSULE(100 MG) BY MOUTH TWICE DAILY ?Patient taking differently: Take 100 mg by mouth daily. 08/22/21  Yes Vivi Barrack, MD  ?cyclobenzaprine (FLEXERIL) 10 MG tablet Take 1 tablet (10 mg total) by mouth 3 (three) times daily. 08/01/19  Yes Inda Coke, PA  ?CYCLOPHOSPHAMIDE IV Inject into the vein every 21 ( twenty-one) days. X 4 cycles 08/05/21  Yes [provider]  ?DOCETAXEL IV Inject into the vein every 21 ( twenty-one) days. X 4 cycles 08/05/21  Yes [provider]  ?escitalopram (LEXAPRO) 20 MG tablet TAKE 1 TABLET(20 MG) BY MOUTH DAILY ?Patient taking differently: Take 20 mg by mouth daily. 08/22/21  Yes Vivi Barrack, MD  ?furosemide (LASIX) 20 MG tablet Take 20 mg by mouth daily as needed for fluid.   Yes [provider]  ?gabapentin (NEURONTIN) 600 MG tablet Take 1 tablet in AM, 1 tablet at noon, and 2 tablets at bedtime. ?Patient taking differently: Take 600-1,200 mg by mouth See admin instructions. Take 1 tablet by mouth in the morning, 1 tablet at noon and 2 tablets at bedtime 08/16/21  Yes Inda Coke, PA  ?lidocaine (LIDODERM) 5 % Place 3 patches onto the skin daily as needed (pain). 01/16/20  Yes [provider]  ?lidocaine-prilocaine (EMLA) cream Apply a small amount to port a cath site (do not rub in) and cover with plastic wrap 1 hour prior to chemotherapy appointments 08/01/21  Yes Derek Jack, MD  ?loperamide (IMODIUM) 2 MG capsule TAKE 1 CAPSULE BY MOUTH DAILY AS NEEDED FOR DIARRHEA OR LOOSE  STOOLS. ?Patient taking differently: Take 2 mg by mouth daily as needed for diarrhea or loose stools. 08/12/21  Yes Pennington, Rebekah M, PA-C  ?magnesium oxide (MAG-OX) 400 (240 Mg) MG tablet Take 1 tablet (400 mg total) by mouth in the morning, at noon, and at bedtime. 08/05/21  Yes Derek Jack, MD  ?montelukast (SINGULAIR) 10 MG tablet TAKE 1 TABLET(10 MG) BY MOUTH AT BEDTIME ?Patient taking differently: Take 10 mg by mouth at bedtime. 08/22/21  Yes Vivi Barrack, MD  ?omeprazole (PRILOSEC) 40 MG capsule Take 1 capsule (40 mg total) by mouth 2 (two) times daily. 08/01/19  Yes Inda Coke, PA  ?oxyCODONE-acetaminophen (PERCOCET) 10-325 MG tablet Take 1 tablet by mouth 6 (six) times daily. 06/08/19  Yes [provider]  ?  Tiotropium Bromide Monohydrate (SPIRIVA RESPIMAT) 2.5 MCG/ACT AERS Inhale 2 puffs into the lungs daily. 05/27/21  Yes Inda Coke, PA  ?tiZANidine (ZANAFLEX) 4 MG tablet Take 1 tablet (4 mg total) by mouth 3 (three) times daily. 06/25/21  Yes Inda Coke, PA  ?traZODone (DESYREL) 100 MG tablet TAKE 2 TABLETS(200 MG) BY MOUTH AT BEDTIME ?Patient taking differently: Take 200 mg by mouth at bedtime. 08/22/21  Yes Vivi Barrack, MD  ?naloxone [facility]) nasal spray 4 mg/0.1 mL  08/28/21   [provider]  ?ondansetron (ZOFRAN) 4 MG tablet Take 1 tablet (4 mg total) by mouth every 8 (eight) hours as needed. ?Patient not taking: Reported on 08/31/2021 05/31/21 05/31/22  Virl Cagey, MD  ?prochlorperazine (COMPAZINE) 10 MG tablet Take 1 tablet (10 mg total) by mouth every 6 (six) hours as needed (Nausea or vomiting). ?Patient not taking: Reported on 08/31/2021 08/01/21   Derek Jack, MD  ? ? ?Physical Exam: ?Vitals:  ? 08/31/21 0207 08/31/21 0300 08/31/21 0400 08/31/21 9924  ?BP: 137/80     ?Pulse: 93     ?Resp: 18     ?Temp: 98 ?F (36.7 ?C)     ?TempSrc: Oral     ?SpO2: 96% 97% 96% 97%  ?Weight:      ?Height:      ? ?General: 66 y.o. female resting in bed in  NAD ?Eyes: PERRL, normal sclera ?ENMT: Nares patent w/o discharge, orophaynx clear, dentition normal, ears w/o discharge/lesions/ulcers ?Neck: Supple, trachea midline ?Cardiovascular: RRR, +S1, S2, no m/g/r,

## 2021-08-31 NOTE — ED Notes (Signed)
[facility] now arrived to [facility] - report has been given to [facility]link team and team preparing pt for xport  ?

## 2021-08-31 NOTE — Progress Notes (Signed)
Pharmacy notified regarding med reconciliation.Per Anderson Malta will send to tech once one is available.  Orest Dikes, RN ?

## 2021-08-31 NOTE — Progress Notes (Signed)
Rn admit note: Pt arrived via [facility] on stretcher. Pt oriented to unit and room. Instructed how to use call bell, notified York Cerise (daughter) of admission. See flowsheet for physical assessment and admission history. Pt on 6L via simple face on admission. Will discuss with MD. No distress noted at this time. Notified Dr. Nevada Crane of new admission. Will continue to monitor pt closely. Orest Dikes, RN ?

## 2021-08-31 NOTE — Progress Notes (Signed)
Notified RT regarding pt's need for albuterol treatment. See flowsheet for assessment. Orest Dikes, RN ?

## 2021-08-31 NOTE — Plan of Care (Signed)
Care plan initiated. Kjones RN 

## 2021-09-01 DIAGNOSIS — F419 Anxiety disorder, unspecified: Secondary | ICD-10-CM

## 2021-09-01 DIAGNOSIS — G8929 Other chronic pain: Secondary | ICD-10-CM

## 2021-09-01 DIAGNOSIS — Z170 Estrogen receptor positive status [ER+]: Secondary | ICD-10-CM

## 2021-09-01 DIAGNOSIS — R600 Localized edema: Secondary | ICD-10-CM

## 2021-09-01 DIAGNOSIS — J9621 Acute and chronic respiratory failure with hypoxia: Secondary | ICD-10-CM

## 2021-09-01 DIAGNOSIS — C50412 Malignant neoplasm of upper-outer quadrant of left female breast: Secondary | ICD-10-CM

## 2021-09-01 LAB — COMPREHENSIVE METABOLIC PANEL
ALT: 15 U/L (ref 0–44)
AST: 20 U/L (ref 15–41)
Albumin: 2.9 g/dL — ABNORMAL LOW (ref 3.5–5.0)
Alkaline Phosphatase: 99 U/L (ref 38–126)
Anion gap: 8 (ref 5–15)
BUN: 5 mg/dL — ABNORMAL LOW (ref 8–23)
CO2: 32 mmol/L (ref 22–32)
Calcium: 9 mg/dL (ref 8.9–10.3)
Chloride: 103 mmol/L (ref 98–111)
Creatinine, Ser: 0.53 mg/dL (ref 0.44–1.00)
GFR, Estimated: >60 mL/min (ref >60)
Glucose, Bld: 97 mg/dL (ref 70–99)
Potassium: 3.6 mmol/L (ref 3.5–5.1)
Sodium: 143 mmol/L (ref 135–145)
Total Bilirubin: 0.5 mg/dL (ref 0.3–1.2)
Total Protein: 6 g/dL — ABNORMAL LOW (ref 6.5–8.1)

## 2021-09-01 LAB — CBC
HCT: 29 % — ABNORMAL LOW (ref 36.0–46.0)
Hemoglobin: 9.1 g/dL — ABNORMAL LOW (ref 12.0–15.0)
MCH: 32 pg (ref 26.0–34.0)
MCHC: 31.4 g/dL (ref 30.0–36.0)
MCV: 102.1 fL — ABNORMAL HIGH (ref 80.0–100.0)
Platelets: 116 10*3/uL — ABNORMAL LOW (ref 150–400)
RBC: 2.84 MIL/uL — ABNORMAL LOW (ref 3.87–5.11)
RDW: 14.3 % (ref 11.5–15.5)
WBC: 5.7 10*3/uL (ref 4.0–10.5)
nRBC: 0 % (ref 0.0–0.2)

## 2021-09-01 LAB — TSH: TSH: 1.925 u[IU]/mL (ref 0.350–4.500)

## 2021-09-01 LAB — VITAMIN B12: Vitamin B-12: 2740 pg/mL — ABNORMAL HIGH (ref 180–914)

## 2021-09-01 MED ORDER — HYDRALAZINE HCL 20 MG/ML IJ SOLN: 10.0000 mg | INTRAMUSCULAR | Status: DC | PRN

## 2021-09-01 MED ORDER — POTASSIUM CHLORIDE CRYS ER 20 MEQ PO TBCR
40.0000 meq | EXTENDED_RELEASE_TABLET | Freq: Once | ORAL | Status: AC
Start: 2021-09-01 — End: 2021-09-01
  Administered 2021-09-01: 40 meq via ORAL
  Filled 2021-09-01: qty 2

## 2021-09-01 MED ORDER — SENNOSIDES-DOCUSATE SODIUM 8.6-50 MG PO TABS: 1.0000 | ORAL_TABLET | Freq: Every evening | ORAL | Status: DC | PRN

## 2021-09-01 MED ORDER — DM-GUAIFENESIN ER 30-600 MG PO TB12: 1.0000 | ORAL_TABLET | Freq: Two times a day (BID) | ORAL | Status: DC | PRN

## 2021-09-01 MED ORDER — METOPROLOL TARTRATE 5 MG/5ML IV SOLN: 5.0000 mg | INTRAVENOUS | Status: DC | PRN

## 2021-09-01 MED ORDER — FUROSEMIDE 10 MG/ML IJ SOLN
40.0000 mg | Freq: Four times a day (QID) | INTRAMUSCULAR | Status: AC
  Administered 2021-09-01 (×2): 40 mg via INTRAVENOUS
  Filled 2021-09-01 (×2): qty 4

## 2021-09-01 NOTE — Progress Notes (Signed)
SATURATION QUALIFICATIONS: (This note is used to comply with regulatory documentation for home oxygen) ? ?Patient Saturations on Room Air at Rest = 93% on [facility] ? ?Patient Saturations on Room Air while Ambulating = 88% on 3LNC ? ?Patient Saturations on 2 Liters of oxygen while Ambulating = 70% ? ?Please briefly explain why patient needs home oxygen.  ?Patient uses 2LNC at baseline and PRN. Attempted ambulation with 2LNC and patients oxygen decreased to 70% increased to [facility] and patients saturation increased to 88% with ambulation and 90% at rest. ?

## 2021-09-01 NOTE — TOC Initial Note (Signed)
Transition of Care (TOC) - Initial/Assessment Note  ? ? ?Patient Details  ?Name: Holly Phillips ?MRN: [id number] ?Date of Birth: Jul 05, 1955 ? ?Transition of Care (TOC) CM/SW Contact:    ?Tawanna Cooler, RN ?Phone Number: ?09/01/2021, 4:33 PM ? ?Clinical Narrative:                 ? ?Patient from home.  Receiving treatment for breast cancer.  Has home O2 through [facility] per previous TOC notes.   ?TOC following for discharge needs.  ? ? ? ?Expected Discharge Plan: Home/Self Care ?Barriers to Discharge: Continued Medical Work up ? ? ?Expected Discharge Plan and Services ?Expected Discharge Plan: Home/Self Care ?  ?   ?Living arrangements for the past 2 months: Apartment ?                ?  ?Prior Living Arrangements/Services ?Living arrangements for the past 2 months: Apartment ?Lives with:: Relatives ?Patient language and need for interpreter reviewed:: Yes ?       ?Need for Family Participation in Patient Care: Yes (Comment) ?Care giver support system in place?: Yes (comment) ?Current home services: DME (home O2 from [place] per prior notes) ?Criminal Activity/Legal Involvement Pertinent to Current Situation/Hospitalization: No - Comment as needed ? ?Activities of Daily Living ?Home Assistive Devices/Equipment: None ?ADL Screening (condition at time of admission) ?Patient's cognitive ability adequate to safely complete daily activities?: Yes ?Is the patient deaf or have difficulty hearing?: No ?Does the patient have difficulty seeing, even when wearing glasses/contacts?: Yes ?Does the patient have difficulty concentrating, remembering, or making decisions?: No ?Patient able to express need for assistance with ADLs?: No ?Does the patient have difficulty dressing or bathing?: No ?Independently performs ADLs?: Yes (appropriate for developmental age) ?Does the patient have difficulty walking or climbing stairs?: No ?Weakness of Legs: None ?Weakness of Arms/Hands: None ? ? ?Emotional Assessment ?  ?Orientation: :  Oriented to Self, Oriented to  Time, Oriented to Place, Oriented to Situation ?Alcohol / Substance Use: Tobacco Use ?Psych Involvement: No (comment) ? ?Admission diagnosis:  Right upper lobe pneumonia [J18.9] ?Community acquired pneumonia, unspecified laterality [J18.9] ?Patient Active Problem List  ? Diagnosis Date Noted  ? Elevated brain natriuretic peptide (BNP) level 08/31/2021  ? Bilateral leg edema 08/31/2021  ? Macrocytic anemia 08/31/2021  ? Chronic pain 08/31/2021  ? Right upper lobe pneumonia 08/30/2021  ? Port-A-Cath in place 08/01/2021  ? Genetic testing 07/30/2021  ? Family history of breast cancer 07/25/2021  ? Osteoporosis 07/25/2021  ? Malignant neoplasm of upper-outer quadrant of left breast in female, estrogen receptor positive ([place]) 05/14/2021  ? Rupture of right gastrocnemius tendon 02/06/2021  ? Seroma due to trauma [facility]) 02/06/2021  ? COVID-19 virus infection 06/28/2020  ? Acute on chronic respiratory failure with hypoxia ([place]) 06/28/2020  ? Hypokalemia 06/28/2020  ? Lactic acidosis 06/28/2020  ? 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 Dysphagia 08/25/2019  ? Tobacco abuse 08/01/2019  ? COPD (chronic obstructive pulmonary disease) ([place]) 08/01/2019  ? Arthritis 08/01/2019  ? History of lumbar fusion 08/01/2019  ? Recurrent depression ([place]) 08/01/2019  ? Anxiety 08/01/2019  ? Hyperlipidemia 08/01/2019  ? Constipation 08/01/2019  ? Gastroesophageal reflux disease 08/01/2019  ? Family history of heart disease 08/01/2019  ? ?PCP:  Inda Coke, PA ?Pharmacy:   ?[facility], [place] [address] ?[address] ?[place] [address] ?Phone: [phone] Fax: [phone] ? ? ?

## 2021-09-01 NOTE — Progress Notes (Signed)
?PROGRESS NOTE ? ? ? ?Holly Phillips  ENI:[id number] DOB: March 05, 1956 DOA: 08/30/2021 ?PCP: Inda Coke, PA  ? ?Brief Narrative:  ?66 year old with past medical history of COPD on as needed oxygen at home, tobacco use, chronic back pain, left breast cancer, HLD comes to the hospital with increasing shortness of breath over 5 days prior to admission.  She also reported of requiring oxygen continuously during this time.  Upon admission she was noted to have elevated BNP and concerns of CHF.  She was started on broad-spectrum antibiotics, IV diuretics.  Echocardiogram showed EF of 70% with mild pericardial effusion without tamponade physiology.   ? ? ?Assessment & Plan: ? Principal Problem: ?  Right upper lobe pneumonia ?Active Problems: ?  Tobacco abuse ?  COPD (chronic obstructive pulmonary disease) ([place]) ?  Anxiety ?  Hyperlipidemia ?  Gastroesophageal reflux disease ?  Acute on chronic respiratory failure with hypoxia (HCC) ?  Malignant neoplasm of upper-outer quadrant of left breast in female, estrogen receptor positive ([place]) ?  Elevated brain natriuretic peptide (BNP) level ?  Bilateral leg edema ?  Macrocytic anemia ?  Chronic pain ?  ?Acute on chronic hypoxic respiratory failure, 2 L as needed oxygen.  Now on 3 L continuously ?- Multifactorial.  Combination of pneumonia, CHF exacerbation. ?Very dyspneic with minimal exertional Desat down to 70%. Significant amt of abnormal BS.  ? ?Right upper lobe pneumonia ?-Patient does not meet sepsis criteria ?- Empiric antibiotics IV Rocephin and azithromycin, will plan to finish 5-day course ?- Bronchodilators, I-S/flutter valve ?- Supportive care.  Supplemental oxygen ?- Urine Legionella and strep- ?-COVID-flu negative ?  ?Acute congestive heart failure with preserved ejection fraction, 70%.  Class II ?Mild pericardial effusion without evidence of cardiac tamponade ?-Volume status appears to have improved.   ?- Echocardiogram shows 70% EF, grade 1 DD.  Mild  pericardial effusion without tamponade. ?-Daily weight and input and output ?STOP LR IVF. Will give lasix '40mg'$  iv bid x 2 doses. Hold PO lasix.   ?  ?Anxiety ?-Continue home medications.  On Lexapro.  Trazodone 20 mg at bedtime ?  ?Left breast cancer, stage I ?-Outpatient oncology follow-up.  Managed by Dr. Delton Coombes actively treated with chemotherapy-docetaxel and cyclophosphamide ?  ?HLD ?-Daily Lipitor ?  ?Chronic pain with peripheral neuropathy ?-Continue home regimen.  Also on gabapentin, Zanaflex ?  ?GERD ?-PPI ?  ?Macrocytic anemia ?-From baseline ?- We will check TSH, B12 and folate ?  ?Tobacco abuse ?-Counseled to quit using.  As needed nicotine patch ? ? ? ?DVT prophylaxis: enoxaparin (LOVENOX) injection 40 mg Start: 08/31/21 1000 ?Code Status: Full code ?Family Communication:   ? ?Status is: Inpatient ?Remains inpatient appropriate because: Significant amt of sob, not safe for home dc ? ? ?Nutritional status ? ? ? ?  ? ?  ? ?Body mass index is 30.27 kg/m?. ? ?  ? ? ? ? ? ?Subjective: ?Still very dyspenic with minimal exertion ? She wants to go home but understand its not safe at this time  ? ? ?Examination: ? ?General exam: Appears calm and comfortable. 3L [place] ?Respiratory system: diffuse BL rhonchi ?Cardiovascular system: S1 & S2 heard, RRR. No JVD, murmurs, rubs, gallops or clicks. No pedal edema. ?Gastrointestinal system: Abdomen is nondistended, soft and nontender. No organomegaly or masses felt. Normal bowel sounds heard. ?Central nervous system: Alert and oriented. No focal neurological deficits. ?Extremities: Symmetric 5 x 5 power. ?Skin: No rashes, lesions or ulcers ?Psychiatry: Judgement and insight appear normal. Mood &  affect appropriate.  ? ? ? ?Objective: ?Vitals:  ? 08/31/21 1810 08/31/21 1811 08/31/21 2057 09/01/21 0433  ?BP:   (!) 165/70 (!) 170/80  ?Pulse:   92 88  ?Resp:   18 18  ?Temp:   98.2 ?F (36.8 ?C) 98.3 ?F (36.8 ?C)  ?TempSrc:   Oral Oral  ?SpO2: 97% 97% 100% 96%  ?Weight:       ?Height:      ? ? ?Intake/Output Summary (Last 24 hours) at 09/01/2021 0737 ?Last data filed at 08/31/2021 1734 ?Gross per 24 hour  ?Intake 1284.74 ml  ?Output --  ?Net 1284.74 ml  ? ?Filed Weights  ? 08/30/21 1524  ?Weight: 70.3 kg  ? ? ? ?Data Reviewed:  ? ?CBC: ?Recent Labs  ?Lab 08/26/21 ?0907 08/30/21 ?1548 08/31/21 ?0704  ?WBC 15.7* 18.5* 14.1*  ?NEUTROABS 12.0* 15.5*  --   ?HGB 9.8* 9.0* 10.3*  ?HCT 32.4* 29.7* 32.8*  ?MCV 103.2* 101.4* 102.2*  ?PLT 475* 110* 73*  ? ?Basic Metabolic Panel: ?Recent Labs  ?Lab 08/26/21 ?0907 08/30/21 ?1548 08/31/21 ?0704 08/31/21 ?9604 09/01/21 ?5409  ?NA 140 137  --  142 143  ?K 5.0 4.1  --  5.0 3.6  ?CL 98 97*  --  104 103  ?CO2 35* 34*  --  28 32  ?GLUCOSE 155* 97  --  150* 97  ?BUN 9 7*  --  7* 5*  ?CREATININE 0.71 0.63 0.53 0.49 0.53  ?CALCIUM 8.7* 8.9  --  9.1 9.0  ?MG 1.8  --   --  2.2  --   ? ?GFR: ?Estimated Creatinine Clearance: 60.5 mL/min (by C-G formula based on SCr of 0.53 mg/dL). ?Liver Function Tests: ?Recent Labs  ?Lab 08/26/21 ?0907 08/30/21 ?1548 08/31/21 ?8119 09/01/21 ?1478  ?AST 14* '24 27 20  '$ ?ALT '11 12 18 15  '$ ?ALKPHOS 131* 103 113 99  ?BILITOT 0.2* 0.8 0.6 0.5  ?PROT 6.0* 6.0* 6.7 6.0*  ?ALBUMIN 2.8* 3.4* 3.2* 2.9*  ? ?No results for input(s): LIPASE, AMYLASE in the last 168 hours. ?No results for input(s): AMMONIA in the last 168 hours. ?Coagulation Profile: ?No results for input(s): INR, PROTIME in the last 168 hours. ?Cardiac Enzymes: ?No results for input(s): CKTOTAL, CKMB, CKMBINDEX, TROPONINI in the last 168 hours. ?BNP (last 3 results) ?No results for input(s): PROBNP in the last 8760 hours. ?HbA1C: ?No results for input(s): HGBA1C in the last 72 hours. ?CBG: ?No results for input(s): GLUCAP in the last 168 hours. ?Lipid Profile: ?No results for input(s): CHOL, HDL, LDLCALC, TRIG, CHOLHDL, LDLDIRECT in the last 72 hours. ?Thyroid Function Tests: ?No results for input(s): TSH, T4TOTAL, FREET4, T3FREE, THYROIDAB in the last 72 hours. ?Anemia  Panel: ?No results for input(s): VITAMINB12, FOLATE, FERRITIN, TIBC, IRON, RETICCTPCT in the last 72 hours. ?Sepsis Labs: ?Recent Labs  ?Lab 08/30/21 ?1651  ?LATICACIDVEN 0.8  ? ? ?Recent Results (from the past 240 hour(s))  ?Blood culture (routine x 2)     Status: None (Preliminary result)  ? Collection Time: 08/30/21  4:51 PM  ? Specimen: BLOOD  ?Result Value Ref Range Status  ? Specimen Description   Final  ?  BLOOD RIGHT ANTECUBITAL ?Performed at [facility], [address] [place], [place] [address] ?  ? Special Requests   Final  ?  BOTTLES DRAWN AEROBIC AND ANAEROBIC Blood Culture adequate volume ?Performed at [facility], [address] [place], [place] [address] ?  ? Culture   Final  ?  NO  GROWTH < 24 HOURS ?Performed at [facility], [place] [address], [place], [place] [address] ?  ? Report Status PENDING  Incomplete  ?Resp Panel by RT-PCR (Flu A&B, Covid) Nasopharyngeal Swab     Status: None  ? Collection Time: 08/31/21  9:51 AM  ? Specimen: Nasopharyngeal Swab; Nasopharyngeal(NP) swabs in vial transport medium  ?Result Value Ref Range Status  ? SARS Coronavirus 2 by RT PCR NEGATIVE NEGATIVE Final  ?  Comment: (NOTE) ?SARS-CoV-2 target nucleic acids are NOT DETECTED. ? ?The SARS-CoV-2 RNA is generally detectable in upper respiratory ?specimens during the acute phase of infection. The lowest ?concentration of SARS-CoV-2 viral copies this assay can detect is ?138 copies/mL. A negative result does not preclude SARS-Cov-2 ?infection and should not be used as the sole basis for treatment or ?other patient management decisions. A negative result may occur with  ?improper specimen collection/handling, submission of specimen other ?than nasopharyngeal swab, presence of viral mutation(s) within the ?areas targeted by this assay, and inadequate number of viral ?copies(<138 copies/mL). A negative result must be combined with ?clinical observations, patient history, and  epidemiological ?information. The expected result is Negative. ? ?Fact Sheet for Patients:  ?EntrepreneurPulse.com.au ? ?Fact Sheet for Healthcare Providers:  ?IncredibleEmployment.be

## 2021-09-02 ENCOUNTER — Other Ambulatory Visit (HOSPITAL_COMMUNITY): Payer: Medicare Other

## 2021-09-02 ENCOUNTER — Ambulatory Visit (HOSPITAL_COMMUNITY): Payer: Medicare Other

## 2021-09-02 ENCOUNTER — Ambulatory Visit (HOSPITAL_COMMUNITY): Payer: Medicare Other | Admitting: Hematology

## 2021-09-02 ENCOUNTER — Encounter (HOSPITAL_COMMUNITY): Payer: Self-pay

## 2021-09-02 DIAGNOSIS — K219 Gastro-esophageal reflux disease without esophagitis: Secondary | ICD-10-CM

## 2021-09-02 DIAGNOSIS — D539 Nutritional anemia, unspecified: Secondary | ICD-10-CM

## 2021-09-02 DIAGNOSIS — Z720 Tobacco use: Secondary | ICD-10-CM

## 2021-09-02 LAB — BASIC METABOLIC PANEL
Anion gap: 8 (ref 5–15)
BUN: <5 mg/dL — ABNORMAL LOW (ref 8–23)
CO2: 34 mmol/L — ABNORMAL HIGH (ref 22–32)
Calcium: 9.1 mg/dL (ref 8.9–10.3)
Chloride: 98 mmol/L (ref 98–111)
Creatinine, Ser: 0.59 mg/dL (ref 0.44–1.00)
GFR, Estimated: >60 mL/min (ref >60)
Glucose, Bld: 94 mg/dL (ref 70–99)
Potassium: 3.7 mmol/L (ref 3.5–5.1)
Sodium: 140 mmol/L (ref 135–145)

## 2021-09-02 LAB — CBC
HCT: 31.4 % — ABNORMAL LOW (ref 36.0–46.0)
Hemoglobin: 9.6 g/dL — ABNORMAL LOW (ref 12.0–15.0)
MCH: 31 pg (ref 26.0–34.0)
MCHC: 30.6 g/dL (ref 30.0–36.0)
MCV: 101.3 fL — ABNORMAL HIGH (ref 80.0–100.0)
Platelets: 144 10*3/uL — ABNORMAL LOW (ref 150–400)
RBC: 3.1 MIL/uL — ABNORMAL LOW (ref 3.87–5.11)
RDW: 14.2 % (ref 11.5–15.5)
WBC: 7 10*3/uL (ref 4.0–10.5)
nRBC: 1 % — ABNORMAL HIGH (ref 0.0–0.2)

## 2021-09-02 LAB — MAGNESIUM: Magnesium: 1.5 mg/dL — ABNORMAL LOW (ref 1.7–2.4)

## 2021-09-02 MED ORDER — PREDNISONE 10 MG PO TABS: ORAL_TABLET | ORAL | 0 refills | Status: AC

## 2021-09-02 MED ORDER — MAGNESIUM SULFATE 4 GM/100ML IV SOLN
4.0000 g | Freq: Once | INTRAVENOUS | Status: AC
Start: 2021-09-02 — End: 2021-09-02
  Administered 2021-09-02: 4 g via INTRAVENOUS
  Filled 2021-09-02: qty 100

## 2021-09-02 MED ORDER — AMOXICILLIN-POT CLAVULANATE 875-125 MG PO TABS: 1.0000 | ORAL_TABLET | Freq: Two times a day (BID) | ORAL | 0 refills | Status: AC

## 2021-09-02 MED ORDER — POTASSIUM CHLORIDE CRYS ER 20 MEQ PO TBCR
40.0000 meq | EXTENDED_RELEASE_TABLET | Freq: Once | ORAL | Status: AC
  Administered 2021-09-02: 40 meq via ORAL
  Filled 2021-09-02: qty 2

## 2021-09-02 MED ORDER — AZITHROMYCIN 250 MG PO TABS: 250.0000 mg | ORAL_TABLET | Freq: Every day | ORAL | 0 refills | Status: AC

## 2021-09-02 NOTE — Progress Notes (Addendum)
SATURATION QUALIFICATIONS: (This note is used to comply with regulatory documentation for home oxygen) ? ?Patient Saturations on Room Air at Rest = 83% ? ?Patient Saturations on Room Air while Ambulating = oxygen dropping to low 80's without oxygen had to put oxygen back on.  ? ?Patient Saturations on 2L Liters of oxygen while Ambulating = 88% ? ?Please briefly explain why patient needs home oxygen: Patient has COPD oxygen required  ?

## 2021-09-02 NOTE — Discharge Summary (Signed)
Physician Discharge Summary  ?Holly Phillips BSW:[id number] DOB: 1955/07/01 DOA: 08/30/2021 ? ?PCP: Inda Coke, PA ? ?Admit date: 08/30/2021 ?Discharge date: 09/02/2021 ? ?Admitted From: Home ?Disposition: Home ? ?Recommendations for Outpatient Follow-up:  ?Follow up with PCP in 1-2 weeks ?Please obtain BMP/CBC in one week your next doctors visit.  ?Prolonged prednisone taper has been prescribed ?Oral Augmentin and azithromycin for 4 days ?Advised to follow-up outpatient oncology. ?Advised to continue using home oxygen 2-3 L continuously over the next several days thereafter slowly transition to as needed which she was on prior to admission to the hospital. ?She reports she has enough bronchodilators at home which have advised her to continue. ? ? ?Discharge Condition: Stable ?CODE STATUS: Full code ?Diet recommendation: Regular ? ?Brief/Interim Summary: ?66 year old with past medical history of COPD on as needed oxygen at home, tobacco use, chronic back pain, left breast cancer, HLD comes to the hospital with increasing shortness of breath over 5 days prior to admission.  She also reported of requiring oxygen continuously during this time.  Upon admission she was noted to have elevated BNP and concerns of CHF.  She was started on broad-spectrum antibiotics, IV diuretics.  Echocardiogram showed EF of 70% with mild pericardial effusion without tamponade physiology.  During the hospital stay patient's breathing symptoms significantly improved over 2 days and her antibiotics were transitioned to oral.  Patient really wanted to go home as she felt much better.  She is medically stable to be discharged with recommendations as stated above. ?  ?  ?Assessment & Plan: ? Principal Problem: ?  Right upper lobe pneumonia ?Active Problems: ?  Tobacco abuse ?  COPD (chronic obstructive pulmonary disease) ([place]) ?  Anxiety ?  Hyperlipidemia ?  Gastroesophageal reflux disease ?  Acute on chronic respiratory failure with  hypoxia (HCC) ?  Malignant neoplasm of upper-outer quadrant of left breast in female, estrogen receptor positive ([place]) ?  Elevated brain natriuretic peptide (BNP) level ?  Bilateral leg edema ?  Macrocytic anemia ?  Chronic pain ?  ?Acute on chronic hypoxic respiratory failure, 2 L as needed oxygen.  Now on 3 L continuously ?- Multifactorial.  Combination of pneumonia, CHF exacerbation. ?Significantly improved today.  Saturating greater than 88% on 2 L nasal cannula.  She is on 2 L at home. ?  ?Right upper lobe pneumonia ?-Patient does not meet sepsis criteria ?- We will transition patient's antibiotics to oral Augmentin and azithromycin, complete 4 more days of antibiotics.  She reports she has enough bronchodilators at home.  Advised to continue incentive spirometer and flutter valve at home. ?She is also been given prolonged course of steroid to help with the bronchitis component ? ?Acute congestive heart failure with preserved ejection fraction, 70%.  Class I ?Mild pericardial effusion without evidence of cardiac tamponade ?-Volume status appears to have improved.   ?- Echocardiogram shows 70% EF, grade 1 DD.  Mild pericardial effusion without tamponade. ?- Resume home Lasix.  Appears to be euvolemic today. ?  ?Anxiety ?-Continue home medications.  On Lexapro.  Trazodone 20 mg at bedtime ?  ?Left breast cancer, stage I ?-Outpatient oncology follow-up.  Managed by Dr. Delton Coombes actively treated with chemotherapy-docetaxel and cyclophosphamide ?  ?HLD ?-Daily Lipitor ?  ?Chronic pain with peripheral neuropathy ?-Continue home regimen.  Also on gabapentin, Zanaflex ?  ?GERD ?-PPI ?  ?Macrocytic anemia ?-From baseline ?- B12 elevated, TSH normal ?  ?Tobacco abuse ?-Counseled to quit using.  As needed nicotine patch ? ? ?Assessment  and Plan: ?No notes have been filed under this hospital service. ?Service: Hospitalist ? ? ? ?  ?Body mass index is 30.27 kg/m?. ? ?  ? ? ? ?Discharge Diagnoses:  ?Principal Problem: ?   Right upper lobe pneumonia ?Active Problems: ?  Tobacco abuse ?  COPD (chronic obstructive pulmonary disease) ([place]) ?  Anxiety ?  Hyperlipidemia ?  Gastroesophageal reflux disease ?  Acute on chronic respiratory failure with hypoxia (HCC) ?  Malignant neoplasm of upper-outer quadrant of left breast in female, estrogen receptor positive ([place]) ?  Elevated brain natriuretic peptide (BNP) level ?  Bilateral leg edema ?  Macrocytic anemia ?  Chronic pain ? ? ? ? ? ?Consultations: ?None ? ?Subjective: ?Patient feels much better this morning and wishes to go home.  On 2 L nasal cannula her saturations remained greater than 88. ? ?Discharge Exam: ?Vitals:  ? 09/02/21 0725 09/02/21 0800  ?BP:    ?Pulse:    ?Resp:    ?Temp:    ?SpO2: 96% (!) 82%  ? ?Vitals:  ? 09/01/21 2019 09/02/21 0445 09/02/21 0725 09/02/21 0800  ?BP: 135/64 (!) 153/73    ?Pulse: 98 (!) 108    ?Resp: 18 18    ?Temp: 98.2 ?F (36.8 ?C) 98.4 ?F (36.9 ?C)    ?TempSrc: Oral Oral    ?SpO2: 96% 94% 96% (!) 82%  ?Weight:      ?Height:      ? ? ?General: Pt is alert, awake, not in acute distress ?Cardiovascular: RRR, S1/S2 +, no rubs, no gallops ?Respiratory: Very minimal bilateral rhonchi just at the bases ?Abdominal: Soft, NT, ND, bowel sounds + ?Extremities: no edema, no cyanosis ? ?Discharge Instructions ? ? ?Allergies as of 09/02/2021   ? ?   Reactions  ? Mucinex [guaifenesin Er] Other (See Comments)  ? Jerky movements  ? ?  ? ?  ?Medication List  ?  ? ?TAKE these medications   ? ?albuterol (2.5 MG/3ML) 0.083% nebulizer solution ?Commonly known as: PROVENTIL ?Take 3 mLs (2.5 mg total) by nebulization every 6 (six) hours as needed for wheezing or shortness of breath. ?What changed: Another medication with the same name was changed. Make sure you understand how and when to take each. ?  ?albuterol 108 (90 Base) MCG/ACT inhaler ?Commonly known as: VENTOLIN HFA ?INHALE 1 TO 2 PUFFS INTO THE LUNGS EVERY 6 HOURS AS NEEDED FOR WHEEZING OR SHORTNESS OF BREATH ?What  changed: See the new instructions. ?  ?amoxicillin-clavulanate 875-125 MG tablet ?Commonly known as: Augmentin ?Take 1 tablet by mouth 2 (two) times daily for 4 days. ?  ?atorvastatin 20 MG tablet ?Commonly known as: LIPITOR ?Take 1 tablet (20 mg total) by mouth daily. ?  ?azithromycin 250 MG tablet ?Commonly known as: Zithromax Z-Pak ?Take 1 tablet (250 mg total) by mouth daily for 4 days. ?  ?budesonide-formoterol 160-4.5 MCG/ACT inhaler ?Commonly known as: SYMBICORT ?Inhale 2 puffs into the lungs 2 (two) times daily. ?  ?celecoxib 100 MG capsule ?Commonly known as: CELEBREX ?TAKE 1 CAPSULE(100 MG) BY MOUTH TWICE DAILY ?What changed: See the new instructions. ?  ?cyclobenzaprine 10 MG tablet ?Commonly known as: FLEXERIL ?Take 1 tablet (10 mg total) by mouth 3 (three) times daily. ?  ?CYCLOPHOSPHAMIDE IV ?Inject into the vein every 21 ( twenty-one) days. X 4 cycles ?  ?DOCETAXEL IV ?Inject into the vein every 21 ( twenty-one) days. X 4 cycles ?  ?escitalopram 20 MG tablet ?Commonly known as: LEXAPRO ?TAKE 1 TABLET(20 MG)  BY MOUTH DAILY ?What changed: See the new instructions. ?  ?furosemide 20 MG tablet ?Commonly known as: LASIX ?Take 20 mg by mouth daily as needed for fluid. ?  ?gabapentin 600 MG tablet ?Commonly known as: Neurontin ?Take 1 tablet in AM, 1 tablet at noon, and 2 tablets at bedtime. ?What changed:  ?how much to take ?how to take this ?when to take this ?additional instructions ?  ?lidocaine 5 % ?Commonly known as: LIDODERM ?Place 3 patches onto the skin daily as needed (pain). ?  ?lidocaine-prilocaine cream ?Commonly known as: EMLA ?Apply a small amount to port a cath site (do not rub in) and cover with plastic wrap 1 hour prior to chemotherapy appointments ?  ?loperamide 2 MG capsule ?Commonly known as: IMODIUM ?TAKE 1 CAPSULE BY MOUTH DAILY AS NEEDED FOR DIARRHEA OR LOOSE STOOLS. ?What changed: See the new instructions. ?  ?magnesium oxide 400 (240 Mg) MG tablet ?Commonly known as: MAG-OX ?Take 1  tablet (400 mg total) by mouth in the morning, at noon, and at bedtime. ?  ?montelukast 10 MG tablet ?Commonly known as: SINGULAIR ?TAKE 1 TABLET(10 MG) BY MOUTH AT BEDTIME ?What changed: See the new instructi

## 2021-09-02 NOTE — Evaluation (Signed)
Occupational Therapy Evaluation ?Patient Details ?Name: Holly Phillips ?MRN: [id number] ?DOB: 1956-01-18 ?Today's Date: 09/02/2021 ? ? ?History of Present Illness Patient is [age] year old female who presented to the hospital with 5 day history of SOB. patient was found to have right upper lobe pneumonia, acute on chronic hypoxic respiratory failure and CHF. PMH: COPD, tobacco abuse, chronic back pain, L breast CA, HLD, anxiety  ? ?Clinical Impression ?  ?Patient evaluated by Occupational Therapy with no further acute OT needs identified. All education has been completed and the patient has no further questions. Patient is MI for ADL tasks. Patient endorses being able to complete any tasks she needs. Patient was educated on ECT. Patient reported she has friends to ask for assistanec during the day if needed.  See below for any follow-up Occupational Therapy or equipment needs. OT is signing off. Thank you for this referral. ?  ?   ? ?Recommendations for follow up therapy are one component of a multi-disciplinary discharge planning process, led by the attending physician.  Recommendations may be updated based on patient status, additional functional criteria and insurance authorization.  ? ?Follow Up Recommendations ? No OT follow up  ?  ?Assistance Recommended at Discharge Intermittent Supervision/Assistance  ?Patient can return home with the following Assistance with cooking/housework;Assist for transportation;Help with stairs or ramp for entrance ? ?  ?Functional Status Assessment ? Patient has not had a recent decline in their functional status  ?Equipment Recommendations ? None recommended by OT  ?  ?Recommendations for Other Services   ? ? ?  ?Precautions / Restrictions Precautions ?Precaution Comments: monitor O2 ?Restrictions ?Weight Bearing Restrictions: No  ? ?  ? ?Mobility Bed Mobility ?Overal bed mobility: Modified Independent ?  ?  ?  ?  ?  ?  ?  ?  ? ?Transfers ?  ?  ?  ?  ?  ?  ?  ?  ?  ?  ?  ? ?   ?Balance Overall balance assessment: Modified Independent ?  ?  ?  ?  ?  ?  ?  ?  ?  ?  ?  ?  ?  ?  ?  ?  ?  ?  ?   ? ?ADL either performed or assessed with clinical judgement  ? ?ADL Overall ADL's : Modified independent ?  ?  ?  ?  ?  ?  ?  ?  ?  ?  ?  ?  ?  ?  ?  ?  ?  ?  ?  ?General ADL Comments: patient is able to complete toileting, LB dressing tasks, functional mobility in room with MI. patient was educated on ECT. patient verbalzied understanding  ? ? ? ?Vision   ?Additional Comments: patient reported when she does chemo treatments it takes a few days to get vision back in focus  ?   ?Perception   ?  ?Praxis   ?  ? ?Pertinent Vitals/Pain Pain Assessment ?Pain Assessment: 0-10 ?Pain Score: 5  ?Pain Location: right side and low back ?Pain Descriptors / Indicators: Discomfort ?Pain Intervention(s): Monitored during session, Limited activity within patient's tolerance  ? ? ? ?Hand Dominance Right ?  ?Extremity/Trunk Assessment Upper Extremity Assessment ?Upper Extremity Assessment: RUE deficits/detail ?RUE Deficits / Details: noted to have significant weakness compared to L side. patient reported this is typical for her. patient able to move UE to position but unable to hold against resistance. ?  ?Lower Extremity Assessment ?Lower Extremity  Assessment: Defer to PT evaluation ?  ?Cervical / Trunk Assessment ?Cervical / Trunk Assessment: Normal ?  ?Communication Communication ?Communication: No difficulties ?  ?Cognition Arousal/Alertness: Awake/alert ?Behavior During Therapy: [facility] for tasks assessed/performed ?Overall Cognitive Status: Within Functional Limits for tasks assessed ?  ?  ?  ?  ?  ?  ?  ?  ?  ?  ?  ?  ?  ?  ?  ?  ?  ?  ?  ?General Comments    ? ?  ?Exercises   ?  ?Shoulder Instructions    ? ? ?Home Living Family/patient expects to be discharged to:: Private residence ?Living Arrangements: Other relatives (ex son in law) ?Available Help at Discharge: Family;Available PRN/intermittently ?Type of Home:  Apartment ?Home Access: Stairs to enter ?Entrance Stairs-Number of Steps: 17 ?Entrance Stairs-Rails: Can reach both ?Home Layout: One level ?  ?  ?Bathroom Shower/Tub: Tub/shower unit ?  ?  ?  ?  ?Home Equipment: None ?  ?  ?  ? ?  ?Prior Functioning/Environment Prior Level of Function : Independent/Modified Independent ?  ?  ?  ?  ?  ?  ?  ?  ?  ? ?  ?  ?OT Problem List:   ?  ?   ?OT Treatment/Interventions:    ?  ?OT Goals(Current goals can be found in the care plan section) Acute Rehab OT Goals ?Patient Stated Goal: to gget back home ?OT Goal Formulation: With patient ?Time For Goal Achievement: 09/16/21 ?Potential to Achieve Goals: Good  ?OT Frequency:   ?  ? ?Co-evaluation   ?  ?  ?  ?  ? ?  ?AM-PAC OT "6 Clicks" Daily Activity     ?Outcome Measure Help from another person eating meals?: None ?Help from another person taking care of personal grooming?: None ?Help from another person toileting, which includes using toliet, bedpan, or urinal?: None ?Help from another person bathing (including washing, rinsing, drying)?: None ?Help from another person to put on and taking off regular upper body clothing?: None ?Help from another person to put on and taking off regular lower body clothing?: None ?6 Click Score: 24 ?  ?End of Session   ? ?Activity Tolerance: Patient tolerated treatment well ?Patient left: in bed;with call bell/phone within reach ? ?OT Visit Diagnosis: Unsteadiness on feet (R26.81)  ?              ?Time: 1610-9604 ?OT Time Calculation (min): 16 min ?Charges:  OT General Charges ?$OT Visit: 1 Visit ?OT Evaluation ?$OT Eval Low Complexity: 1 Low ? ?Josh Nicolosi OTR/L, MS ?Acute Rehabilitation Department ?Office# [phone] ?Pager# [phone] ? ? ?[place] ?09/02/2021, 9:06 AM ?

## 2021-09-02 NOTE — TOC Progression Note (Signed)
Transition of Care (TOC) - Progression Note  ? ? ?Patient Details  ?Name: Holly Phillips ?MRN: [id number] ?Date of Birth: [birth year] ? ?Transition of Care (TOC) CM/SW Contact  ?Tawanna Cooler, RN ?Phone Number: ?09/02/2021, 10:24 AM ? ?Clinical Narrative:    ? ?Patient has home O2 through [facility].  Called local [facility], Caryl Pina, who states patient home O2 set up is from [place], [place].  She has not transferred her service to the local office.  Will need a new RN sat note and new home O2 order.  RN sat note is in, pending home O2 order.   ?

## 2021-09-03 ENCOUNTER — Telehealth: Payer: Self-pay

## 2021-09-03 LAB — FOLATE RBC
Folate, Hemolysate: 372 ng/mL
Folate, RBC: UNDETERMINED ng/mL

## 2021-09-03 LAB — HEMATOLOGY COMMENTS:

## 2021-09-03 NOTE — Telephone Encounter (Signed)
Transition Care Management Follow-up Telephone Call ?Date of discharge and from where: [facility] appt  09/02/21 ?How have you been since you were released from the hospital? ok ?Any questions or concerns? No ? ?Items Reviewed: ?Did the pt receive and understand the discharge instructions provided? Yes  ?Medications obtained and verified? Yes  ?Other? No  ?Any new allergies since your discharge? No  ?Dietary orders reviewed? Yes ?Do you have support at home? Yes  ? ?Home Care and Equipment/Supplies: ?Were home health services ordered? not applicable ?If so, what is the name of the agency?   ?Has the agency set up a time to come to the patient's home? not applicable ?Were any new equipment or medical supplies ordered?  No ?What is the name of the medical supply agency?  ?Were you able to get the supplies/equipment? not applicable ?Do you have any questions related to the use of the equipment or supplies? No ? ?Functional Questionnaire: (I = Independent and D = Dependent) ?ADLs: I ? ?Bathing/Dressing- I ? ?Meal Prep- I ? ?Eating- I ? ?Maintaining continence- I ? ?Transferring/Ambulation- I ? ?Managing Meds- I ? ?Follow up appointments reviewed: ? ?PCP Hospital f/u appt confirmed? Yes  Scheduled to see Inda Coke  on 09/10/21 @ 1:00. ?[facility] f/u appt confirmed? Yes  Scheduled to see Dr Halford Chessman  on 09/12/21 @ 9:45. ?Are transportation arrangements needed? No  ?If their condition worsens, is the pt aware to call PCP or go to the Emergency Dept.? Yes ?Was the patient provided with contact information for the PCP's office or ED? Yes ?Was to pt encouraged to call back with questions or concerns? Yes ? ?

## 2021-09-04 LAB — CULTURE, BLOOD (ROUTINE X 2)
Culture: NO GROWTH
Special Requests: ADEQUATE

## 2021-09-05 ENCOUNTER — Other Ambulatory Visit: Payer: Self-pay | Admitting: Physician Assistant

## 2021-09-05 LAB — CULTURE, BLOOD (ROUTINE X 2)
Culture: NO GROWTH
Special Requests: ADEQUATE

## 2021-09-05 NOTE — Telephone Encounter (Signed)
Rx was discontinues on 02/06/21; called pt and stated she has quit completely for three weeks but still needs a little help and only has one patch left.  ?

## 2021-09-06 ENCOUNTER — Encounter: Payer: Self-pay | Admitting: *Deleted

## 2021-09-09 ENCOUNTER — Ambulatory Visit: Payer: Medicare Other | Admitting: Physician Assistant

## 2021-09-10 ENCOUNTER — Encounter: Payer: Self-pay | Admitting: Physician Assistant

## 2021-09-10 ENCOUNTER — Ambulatory Visit (INDEPENDENT_AMBULATORY_CARE_PROVIDER_SITE_OTHER): Payer: Medicare Other | Admitting: Physician Assistant

## 2021-09-10 VITALS — BP 150/80 | HR 95 | Temp 97.9°F | Ht 60.0 in | Wt 147.0 lb

## 2021-09-10 DIAGNOSIS — J189 Pneumonia, unspecified organism: Secondary | ICD-10-CM

## 2021-09-10 DIAGNOSIS — R1013 Epigastric pain: Secondary | ICD-10-CM | POA: Diagnosis not present

## 2021-09-10 NOTE — Patient Instructions (Addendum)
It was great to see you! ? ?[employer] PA ?Ophthalmologist in [place], [place] ?Located in: [facility] ?Address: [address], [place], [place] [address] ?Phone: [phone] ? ?Referral to gastroenterology placed today to discuss your pain and update colon cancer screening. ? ?Take care, ? ?Inda Coke PA-C  ? ? ? ? ? ?

## 2021-09-10 NOTE — Progress Notes (Signed)
Holly Phillips is a 66 y.o. female here for a hospital follow up. ? ?History of Present Illness:  ? ?Chief Complaint  ?Patient presents with  ? Hospitalization Follow-up  ?  Pt here for f/u Pneumonia.  ? ? ?HPI ? ?Hospital Follow Up ?On 08/30/21, Holly Phillips presented to our scheduled visit, but upon finding that her O2 stats were in the 50s she was referred to ER. Upon her arrival to the ED she c/o worsening SOB that had been onset for five days. She also reported to having a non-productive cough that had been gradually increasing.  ? ?Further examination included a WBC of 26.3 and a CXR which found new opacity in the right upper lobe consistent with pneumonia. Following this pt was started on IV antibiotics with rocephin and azithromycin. She was also placed on oxygen until her O2 stats were appropriate. As a result of her findings, it was determined that Holly Phillips required admission.  ? ?Per Dr. Latina Craver note on 09/02/21, pt was noted to have elevated BNP and concerns of CHF upon admission. After further evaluation of this via ECHO, pt had an EF of 70% with mild pericardial effusion without tamponade physiology. She was started on broad-spectrum antibiotics and IV diuretics. Following her 2 days of admission, her breathing sx significantly improved and her antibiotics were transitioned to oral. Upon discharge pt was prescribed a prednisone 40 mg -10 mg taper, augmentin 875-125 mg twice daily x 4 days , azithromycin 250 mg daily x 4 days. She was additionally instructed to continue use of home oxygen, 2-3 L continuously over the next few days and trend down to prn and bronchodilators as well as f/u with PCP and oncology within a 1-2 weeks.  ? ?Currently pt states that she has remained compliant with her medication and has felt much better. States that she continued use of her oxygen 2-3 L but has not used it while completing chores such as cleaning. During these times she has monitored her O2 stats and found it has  only gotten as low as 87% when she is not using her oxygen for a prolonged amount of time. Additionally, Holly Phillips reports she has stopped smoking cigarettes 15 days ago and transitioned to a nicotine free vaping device. While she uses this device often, she is still proud of herself for not smoking cigarettes. With all of this being said, she is managing well at this time.  ? ? ?Epigastric Pain ?As our visit continued, pt reported she had been experiencing intermittent epigastric pain that occurs after eating food. She describes this as though her food is being caught in her upper stomach following her swallowing. In turn this causes her to experience heartburn, despite being compliant with taking prilosec 40 mg daily. At this time she is interested in having this further evaluated due to concern of something more significant occurring. Denies fever, chills, choking sensation, or known food triggers.  ? ? ? ?Past Medical History:  ?Diagnosis Date  ? Anxiety   ? Arthritis   ? Asthma   ? Chronic back pain   ? Colon polyps   ? COPD (chronic obstructive pulmonary disease) ([place])   ? Depression   ? Emphysema of lung ([place])   ? Family history of breast cancer   ? GERD (gastroesophageal reflux disease)   ? Memory change   ? Port-A-Cath in place 08/01/2021  ? Sleep apnea   ? TIA (transient ischemic attack)   ? Tremor   ? ?  ?Social History  ? ?  Tobacco Use  ? Smoking status: Every Day  ?  Packs/day: 0.50  ?  Types: Cigarettes  ?  Last attempt to quit: 07/17/2020  ?  Years since quitting: 1.1  ? Smokeless tobacco: Never  ? Tobacco comments:  ?  Not smoking cigarettes but is vaping  04/22/21  ?Vaping Use  ? Vaping Use: Every day  ? Substances: Nicotine, Flavoring  ?Substance Use Topics  ? Alcohol use: Not Currently  ? Drug use: Not Currently  ?  Types: Other-see comments  ?  Comment: CBD and medical marijuana- denies 8/28/2,04/22/21  ? ? ?Past Surgical History:  ?Procedure Laterality Date  ? ABDOMINAL HYSTERECTOMY    ? BREAST  LUMPECTOMY WITH RADIOFREQUENCY TAG IDENTIFICATION Left 08/25/8117  ? Procedure: BREAST LUMPECTOMY WITH RADIOFREQUENCY TAG IDENTIFICATION;  Surgeon: Virl Cagey, MD;  Location: [facility];  Service: General;  Laterality: Left;  ? CERVICAL SPINE SURGERY    ? INNER EAR SURGERY    ? LUMBAR FUSION  2012  ? L3-L7  ? PARTIAL MASTECTOMY WITH AXILLARY SENTINEL LYMPH NODE BIOPSY Left 05/31/2021  ? Procedure: PARTIAL MASTECTOMY WITH AXILLARY SENTINEL LYMPH NODE BIOPSY;  Surgeon: Virl Cagey, MD;  Location: [facility];  Service: General;  Laterality: Left;  ? PORTACATH PLACEMENT Right 07/30/2021  ? Procedure: INSERTION PORT-A-CATH;  Surgeon: Virl Cagey, MD;  Location: [facility];  Service: General;  Laterality: Right;  ? ? ?Family History  ?Problem Relation Age of Onset  ? Depression Mother   ? Diabetes Mother   ? Hypertension Mother   ? Hyperlipidemia Mother   ? Heart attack Mother   ? Osteoarthritis Father   ? Asthma Father   ? COPD Father   ? Breast cancer Sister   ?     dx 23s-60s  ? Lupus Sister   ? Osteoarthritis Sister   ? Asthma Sister   ? COPD Sister   ? Diabetes Sister   ? Drug abuse Sister   ? Heart attack Sister   ? Heart attack Maternal Grandmother   ? Colon cancer Neg Hx   ? Esophageal cancer Neg Hx   ? ? ?Allergies  ?Allergen Reactions  ? Mucinex [Guaifenesin Er] Other (See Comments)  ?  Jerky movements  ? ? ?Current Medications:  ? ?Current Outpatient Medications:  ?  albuterol (PROVENTIL) (2.5 MG/3ML) 0.083% nebulizer solution, Take 3 mLs (2.5 mg total) by nebulization every 6 (six) hours as needed for wheezing or shortness of breath., Disp: 150 mL, Rfl: 1 ?  albuterol (VENTOLIN HFA) 108 (90 Base) MCG/ACT inhaler, INHALE 1 TO 2 PUFFS INTO THE LUNGS EVERY 6 HOURS AS NEEDED FOR WHEEZING OR SHORTNESS OF BREATH (Patient taking differently: 1-2 puffs every 6 (six) hours as needed for wheezing or shortness of breath.), Disp: 18 g, Rfl: 5 ?  atorvastatin (LIPITOR) 20 MG tablet, Take 1 tablet (20 mg total) by  mouth daily., Disp: 90 tablet, Rfl: 3 ?  budesonide-formoterol (SYMBICORT) 160-4.5 MCG/ACT inhaler, Inhale 2 puffs into the lungs 2 (two) times daily., Disp: 10.2 g, Rfl: 11 ?  celecoxib (CELEBREX) 100 MG capsule, TAKE 1 CAPSULE(100 MG) BY MOUTH TWICE DAILY (Patient taking differently: Take 100 mg by mouth daily.), Disp: 180 capsule, Rfl: 1 ?  cyclobenzaprine (FLEXERIL) 10 MG tablet, Take 1 tablet (10 mg total) by mouth 3 (three) times daily., Disp: 90 tablet, Rfl: 1 ?  CYCLOPHOSPHAMIDE IV, Inject into the vein every 21 ( twenty-one) days. X 4 cycles, Disp: , Rfl:  ?  DOCETAXEL  IV, Inject into the vein every 21 ( twenty-one) days. X 4 cycles, Disp: , Rfl:  ?  escitalopram (LEXAPRO) 20 MG tablet, TAKE 1 TABLET(20 MG) BY MOUTH DAILY (Patient taking differently: Take 20 mg by mouth daily.), Disp: 90 tablet, Rfl: 1 ?  furosemide (LASIX) 20 MG tablet, Take 20 mg by mouth daily as needed for fluid., Disp: , Rfl:  ?  gabapentin (NEURONTIN) 600 MG tablet, Take 1 tablet in AM, 1 tablet at noon, and 2 tablets at bedtime. (Patient taking differently: Take 600-1,200 mg by mouth See admin instructions. Take 1 tablet by mouth in the morning, 1 tablet at noon and 2 tablets at bedtime), Disp: 360 tablet, Rfl: 1 ?  lidocaine (LIDODERM) 5 %, Place 3 patches onto the skin daily as needed (pain)., Disp: , Rfl:  ?  lidocaine-prilocaine (EMLA) cream, Apply a small amount to port a cath site (do not rub in) and cover with plastic wrap 1 hour prior to chemotherapy appointments, Disp: 30 g, Rfl: 3 ?  loperamide (IMODIUM) 2 MG capsule, TAKE 1 CAPSULE BY MOUTH DAILY AS NEEDED FOR DIARRHEA OR LOOSE STOOLS. (Patient taking differently: Take 2 mg by mouth daily as needed for diarrhea or loose stools.), Disp: 90 capsule, Rfl: 0 ?  magnesium oxide (MAG-OX) 400 (240 Mg) MG tablet, Take 1 tablet (400 mg total) by mouth in the morning, at noon, and at bedtime., Disp: 90 tablet, Rfl: 2 ?  montelukast (SINGULAIR) 10 MG tablet, TAKE 1 TABLET(10 MG) BY  MOUTH AT BEDTIME (Patient taking differently: Take 10 mg by mouth at bedtime.), Disp: 90 tablet, Rfl: 1 ?  naloxone (NARCAN) nasal spray 4 mg/0.1 mL, , Disp: , Rfl:  ?  nicotine (NICODERM CQ - DOSED IN MG/24 H

## 2021-09-11 ENCOUNTER — Telehealth: Payer: Self-pay

## 2021-09-11 ENCOUNTER — Telehealth: Payer: Self-pay | Admitting: Physician Assistant

## 2021-09-11 ENCOUNTER — Other Ambulatory Visit: Payer: Self-pay | Admitting: Physician Assistant

## 2021-09-11 NOTE — Telephone Encounter (Signed)
Caller states that she was referred to schedule an appt. with Dr. Katy Fitch, but he is booked until ?September and she would like to be referred to another physician if possible-  ?

## 2021-09-11 NOTE — Telephone Encounter (Signed)
ATC patient. LVM asking patient to bring SD card to appt tomorrow 09/12/21 ?

## 2021-09-12 ENCOUNTER — Telehealth: Payer: Self-pay | Admitting: Pulmonary Disease

## 2021-09-12 ENCOUNTER — Ambulatory Visit (INDEPENDENT_AMBULATORY_CARE_PROVIDER_SITE_OTHER): Payer: Medicare Other | Admitting: Pulmonary Disease

## 2021-09-12 ENCOUNTER — Ambulatory Visit (HOSPITAL_COMMUNITY)
Admission: RE | Admit: 2021-09-12 | Discharge: 2021-09-12 | Disposition: A | Discharge status: Home / Self Care | Payer: Medicare Other | Source: Ambulatory Visit | Attending: Pulmonary Disease | Admitting: Pulmonary Disease

## 2021-09-12 ENCOUNTER — Encounter: Payer: Self-pay | Admitting: Pulmonary Disease

## 2021-09-12 VITALS — BP 120/60 | HR 89 | Temp 98.0°F | Ht 60.0 in | Wt 151.4 lb

## 2021-09-12 DIAGNOSIS — J449 Chronic obstructive pulmonary disease, unspecified: Secondary | ICD-10-CM | POA: Diagnosis present

## 2021-09-12 DIAGNOSIS — Z8701 Personal history of pneumonia (recurrent): Secondary | ICD-10-CM | POA: Diagnosis present

## 2021-09-12 DIAGNOSIS — J9611 Chronic respiratory failure with hypoxia: Secondary | ICD-10-CM

## 2021-09-12 IMAGING — DX DG CHEST 2V
2 series · 2 of 2 positions shown · non-contrast
Comparison: [DATE]

CLINICAL DATA: Pneumonia, COPD

EXAM:
CHEST - 2 VIEW

[chest pa]
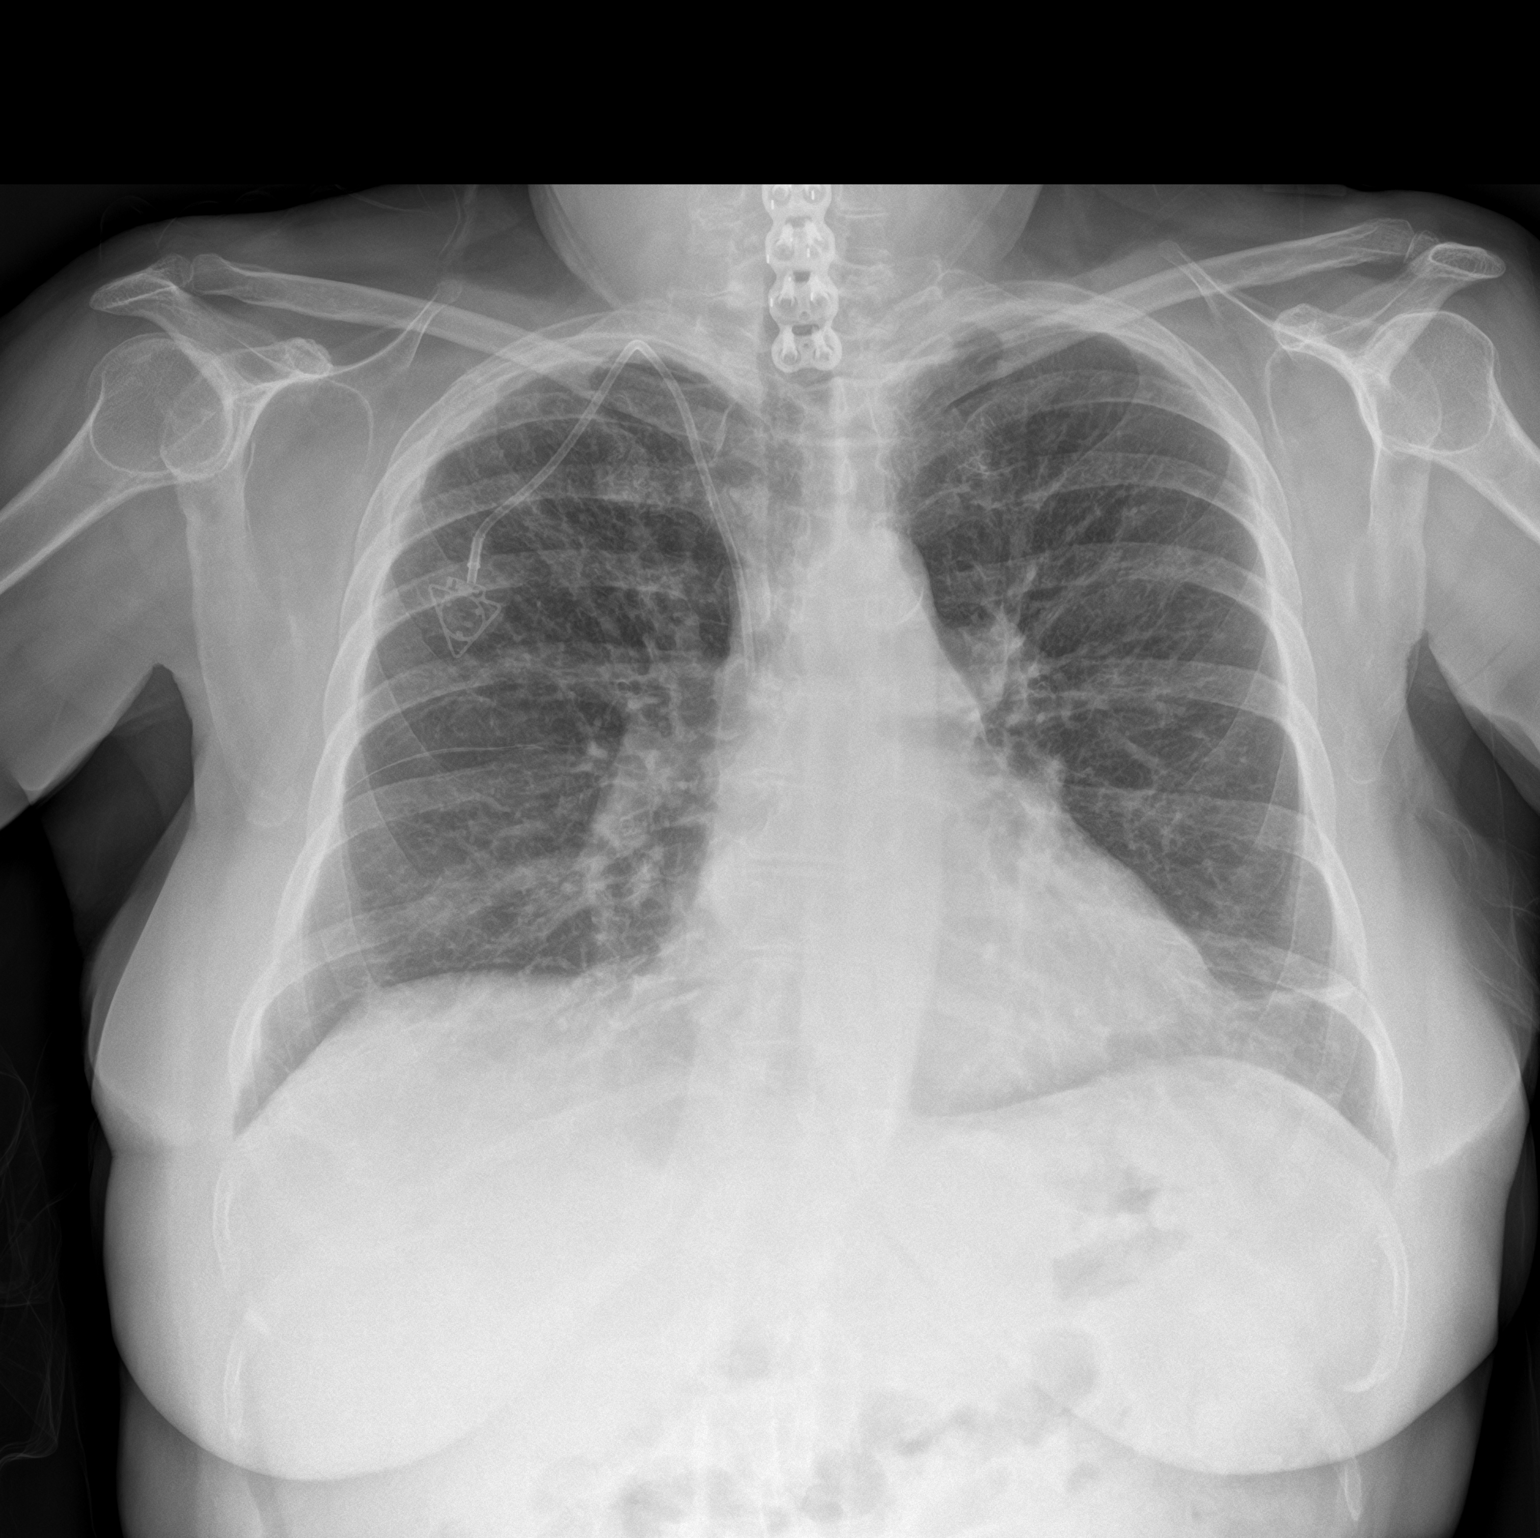

[chest lat]
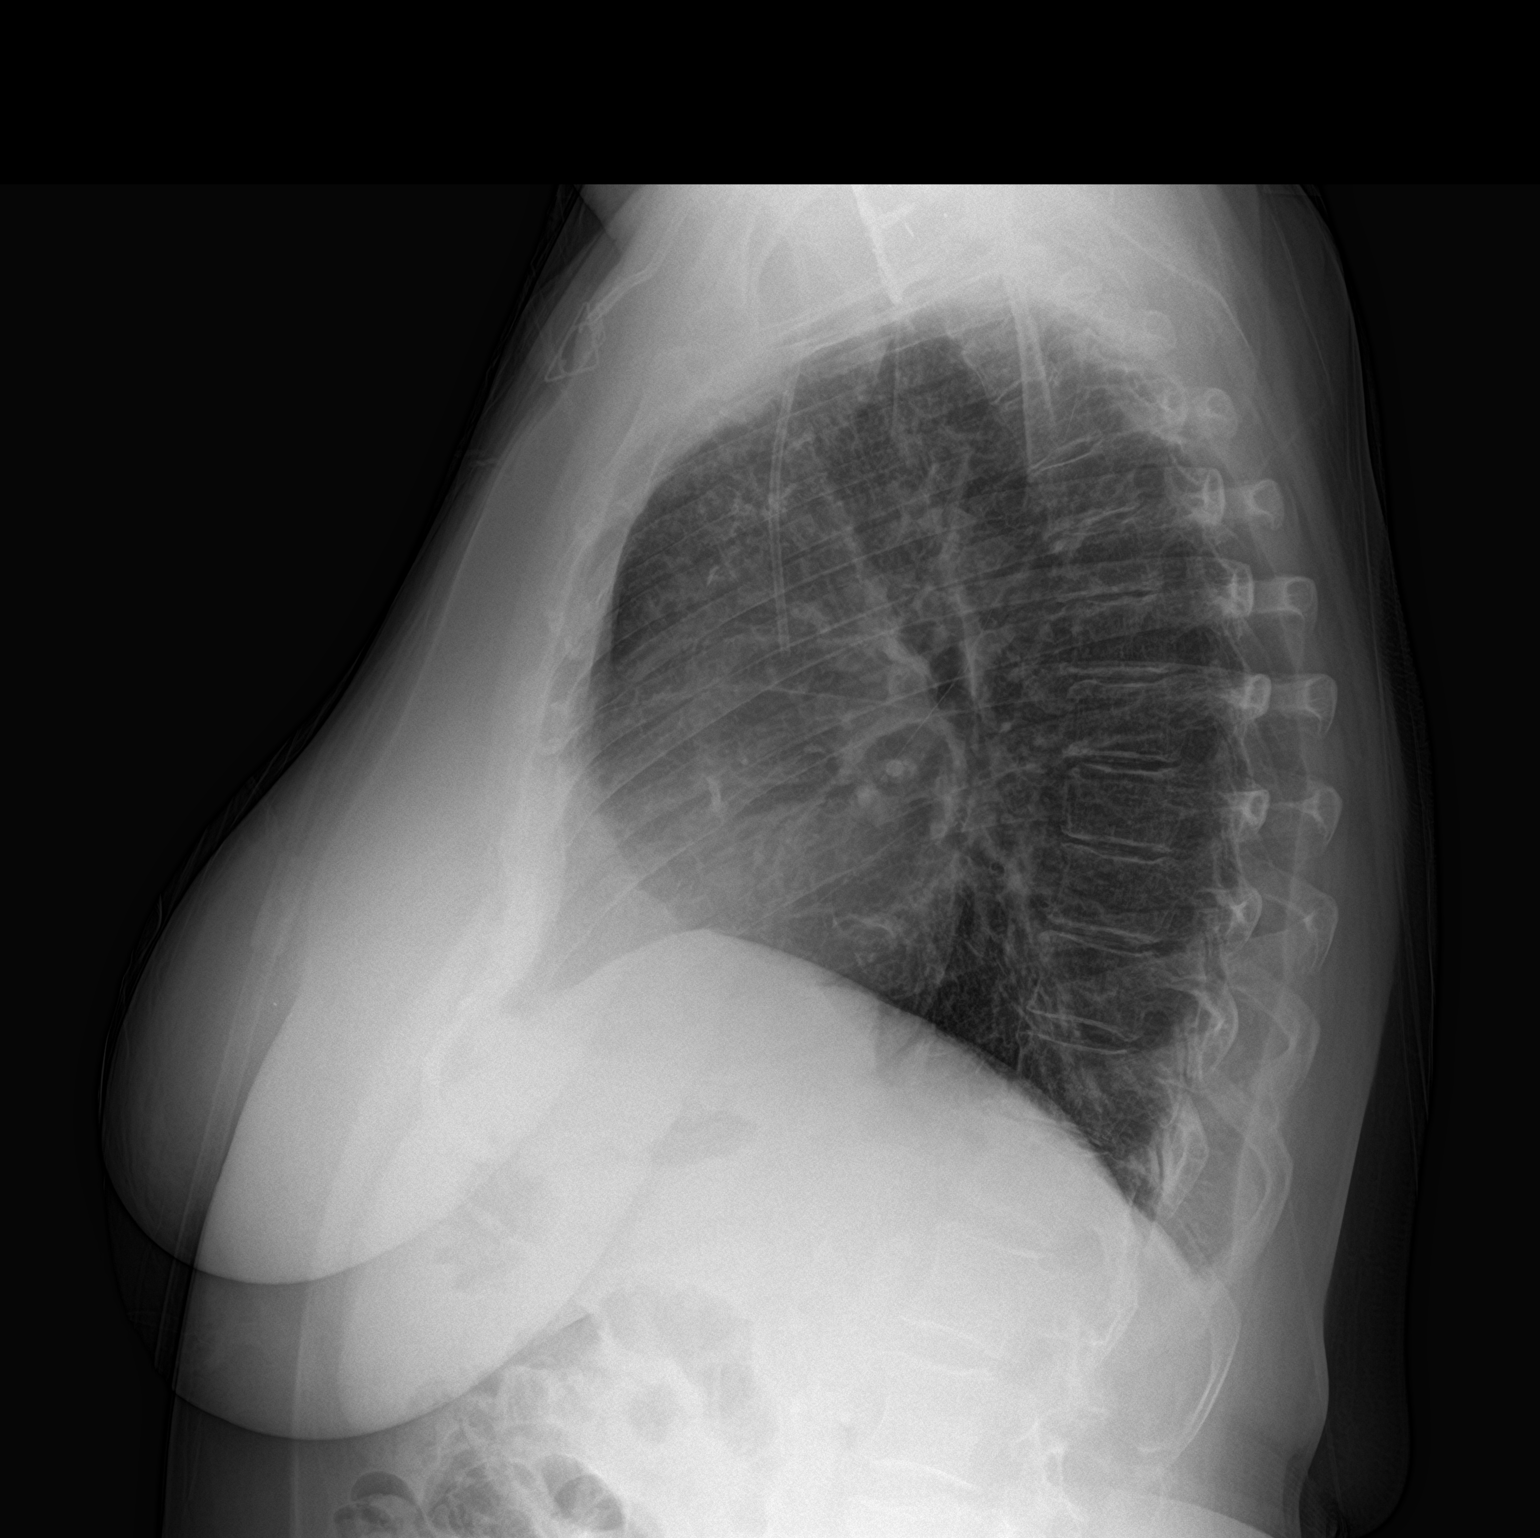

[2 of 2 positions shown; findings below may reference images not displayed]

FINDINGS: Focal pulmonary infiltrate within the a right upper lobe has
resolved. Mild left basilar atelectasis or scarring. The lungs are
otherwise clear. No pneumothorax or pleural effusion. Right internal
jugular chest port tip seen within the superior vena cava. Cardiac
size within normal limits. No acute bone abnormality.
IMPRESSION: Resolved right upper lobe pneumonic infiltrate.

## 2021-09-12 NOTE — Progress Notes (Signed)
? ?[facility] ? ?Chief Complaint  ?Patient presents with  ? Follow-up  ?  Patient feels like she is doing good, no concerns. Feels like her breathing is a little better since she stopped smoking at the beginning of the month  ? ? ?Constitutional:  ?BP 120/60 (BP Location: Right Arm, Patient Position: Sitting, Cuff Size: Normal)   Pulse 89   Temp 98 ?F (36.7 ?C) (Oral)   Ht 5' (1.524 m)   Wt 151 lb 6.4 oz (68.7 kg)   LMP  (LMP Unknown)   SpO2 99% Comment: 2 liters  BMI 29.57 kg/m?  ? ?Past Medical History:  ?Anxiety, OA, Back pain, Colon polyps, Depression, COVID 07 July 2020 ? ?Past Surgical History:  ?She  has a past surgical history that includes Abdominal hysterectomy; Lumbar fusion (2012); Inner ear surgery; Cervical spine surgery; Partial mastectomy with axillary sentinel lymph node biopsy (Left, 05/31/2021); Breast lumpectomy with radiofrequency tag identification (Left, 05/31/2021); and Portacath placement (Right, 07/30/2021). ? ?Brief Summary:  ?Holly Phillips is a 66 y.o. female former smoker with COPD/emphysema, chronic hypoxic respiratory failure and obstructive sleep apnea intolerant of CPAP. ?  ? ? ? ?Subjective:  ? ?She stopped smoking.  Breathing improved. ? ?Not having cough, wheeze, or sputum.  Using symbicort and spiriva.  Keeps up with activity okay.  Not having chest pain or leg swelling. ? ?She was found to have breast cancer in December 2022. ? ?Hasn't used CPAP for a while.  Sleeping okay. ? ?She was in hospital earlier this month for pneumonia.  She completed antibiotics.  She was started on 3 liters oxygen when in hospital, and uses this 24/7. ? ?Physical Exam:  ? ?Appearance - well kempt, wearing oxygen ? ?ENMT - no sinus tenderness, no oral exudate, no LAN, Mallampati 3 airway, no stridor ? ?Respiratory - decreased breath sounds bilaterally, no wheezing or rales ? ?CV - s1s2 regular rate and rhythm, no murmurs ? ?Ext - no clubbing, no  edema ? ?Skin - no rashes ? ?Psych - normal mood and affect ? ?  ?Pulmonary testing:  ? ? ?Chest Imaging:  ?LDCT chest 01/19/20 >> moderate centrilobular emphysema ?CT angio chest 07/18/20 >> centrilobular emphysema with subpleural scarring, mild ASD RUL ? ?Sleep Tests:  ?HST 01/22/17 >> AHI 9.7 ? ?Cardiac Tests:  ?Echo 08/31/21 >> EF 70 to 75%, grade 1 DD, small pericardial effusion, mild MR ? ?Social History:  ?She  reports that she quit smoking about 3 weeks ago. Her smoking use included cigarettes. She smoked an average of .5 packs per day. She has never used smokeless tobacco. She reports that she does not currently use alcohol. She reports that she does not currently use drugs after having used the following drugs: Other-see comments. ? ?Family History:  ?Her family history includes Asthma in her father and sister; Breast cancer in her sister; COPD in her father and sister; Depression in her mother; Diabetes in her mother and sister; Drug abuse in her sister; Heart attack in her maternal grandmother, mother, and sister; Hyperlipidemia in her mother; Hypertension in her mother; Lupus in her sister; Osteoarthritis in her father and sister. ?  ? ? ?Assessment/Plan:  ? ?COPD with emphysema and chronic bronchitis. ?- continue spiriva respimat two puffs daily and symbicort 160 two puffs bid ?- prn albuterol ?- will arrange for PFT in [place] office ? ?Chronic respiratory failure with hypoxia. ?- will arrange for overnight oximetry on 3 liters and then determine  if she needs reassessment of sleep apnea ?- she is using 3 liters oxygen 24/7 since hospitalization for pneumonia in April 2023 ?- will reassess her home oxygen needs at next follow up ? ?History of pneumonia in April 2023. ?- will repeat chest xray today ?  ?Lt breast cancer ?- diagnosed December 2022 ?- followed by Dr. Delton Coombes with oncology ? ?Lung cancer screening. ?- will need to find out if she still qualifies for lung cancer screening program since  she was diagnosed with breast cancer  ?  ? ?Time Spent Involved in Patient Care on Day of Examination:  ?37 minutes ? ?Follow up:  ? ?Patient Instructions  ?Chest xray today at [facility] ? ?Will arrange for overnight oxygen test on 3 liters ? ?Will arrange for pulmonary function test in [place] on same day you have follow up in 3 months ? ?Medication List:  ? ?Allergies as of 09/12/2021   ? ?   Reactions  ? Mucinex [guaifenesin Er] Other (See Comments)  ? Jerky movements  ? ?  ? ?  ?Medication List  ?  ? ?  ? Accurate as of September 12, 2021 10:27 AM. If you have any questions, ask your nurse or doctor.  ?  ?  ? ?  ? ?albuterol (2.5 MG/3ML) 0.083% nebulizer solution ?Commonly known as: PROVENTIL ?Take 3 mLs (2.5 mg total) by nebulization every 6 (six) hours as needed for wheezing or shortness of breath. ?What changed: Another medication with the same name was changed. Make sure you understand how and when to take each. ?  ?albuterol 108 (90 Base) MCG/ACT inhaler ?Commonly known as: VENTOLIN HFA ?INHALE 1 TO 2 PUFFS INTO THE LUNGS EVERY 6 HOURS AS NEEDED FOR WHEEZING OR SHORTNESS OF BREATH ?What changed: See the new instructions. ?  ?atorvastatin 20 MG tablet ?Commonly known as: LIPITOR ?Take 1 tablet (20 mg total) by mouth daily. ?  ?budesonide-formoterol 160-4.5 MCG/ACT inhaler ?Commonly known as: SYMBICORT ?Inhale 2 puffs into the lungs 2 (two) times daily. ?  ?celecoxib 100 MG capsule ?Commonly known as: CELEBREX ?TAKE 1 CAPSULE(100 MG) BY MOUTH TWICE DAILY ?What changed: See the new instructions. ?  ?cyclobenzaprine 10 MG tablet ?Commonly known as: FLEXERIL ?Take 1 tablet (10 mg total) by mouth 3 (three) times daily. ?  ?CYCLOPHOSPHAMIDE IV ?Inject into the vein every 21 ( twenty-one) days. X 4 cycles ?  ?DOCETAXEL IV ?Inject into the vein every 21 ( twenty-one) days. X 4 cycles ?  ?escitalopram 20 MG tablet ?Commonly known as: LEXAPRO ?TAKE 1 TABLET(20 MG) BY MOUTH DAILY ?What changed: See the new  instructions. ?  ?furosemide 20 MG tablet ?Commonly known as: LASIX ?Take 20 mg by mouth daily as needed for fluid. ?  ?gabapentin 600 MG tablet ?Commonly known as: Neurontin ?Take 1 tablet in AM, 1 tablet at noon, and 2 tablets at bedtime. ?What changed:  ?how much to take ?how to take this ?when to take this ?additional instructions ?  ?lidocaine 5 % ?Commonly known as: LIDODERM ?Place 3 patches onto the skin daily as needed (pain). ?  ?lidocaine-prilocaine cream ?Commonly known as: EMLA ?Apply a small amount to port a cath site (do not rub in) and cover with plastic wrap 1 hour prior to chemotherapy appointments ?  ?loperamide 2 MG capsule ?Commonly known as: IMODIUM ?TAKE 1 CAPSULE BY MOUTH DAILY AS NEEDED FOR DIARRHEA OR LOOSE STOOLS. ?What changed: See the new instructions. ?  ?magnesium oxide 400 (240 Mg) MG tablet ?  Commonly known as: MAG-OX ?Take 1 tablet (400 mg total) by mouth in the morning, at noon, and at bedtime. ?  ?montelukast 10 MG tablet ?Commonly known as: SINGULAIR ?TAKE 1 TABLET(10 MG) BY MOUTH AT BEDTIME ?What changed: See the new instructions. ?  ?naloxone 4 MG/0.1ML Liqd nasal spray kit ?Commonly known as: NARCAN ?  ?nicotine 21 mg/24hr patch ?Commonly known as: NICODERM CQ - dosed in mg/24 hours ?APPLY 1 PATCH(21 MG) TOPICALLY TO THE SKIN DAILY ?  ?omeprazole 40 MG capsule ?Commonly known as: PRILOSEC ?Take 1 capsule (40 mg total) by mouth 2 (two) times daily. ?  ?ondansetron 4 MG tablet ?Commonly known as: Zofran ?Take 1 tablet (4 mg total) by mouth every 8 (eight) hours as needed. ?  ?oxyCODONE-acetaminophen 10-325 MG tablet ?Commonly known as: PERCOCET ?Take 1 tablet by mouth 6 (six) times daily. ?  ?predniSONE 10 MG tablet ?Commonly known as: DELTASONE ?Take 4 tablets (40 mg total) by mouth 2 (two) times daily with a meal for 2 days, THEN 4 tablets (40 mg total) daily with breakfast for 2 days, THEN 3 tablets (30 mg total) daily with breakfast for 2 days, THEN 2 tablets (20 mg total)  daily with breakfast for 3 days, THEN 1 tablet (10 mg total) daily with breakfast for 2 days. ?Start taking on: September 02, 2021 ?  ?prochlorperazine 10 MG tablet ?Commonly known as: COMPAZINE ?Take 1 tablet (10 mg to

## 2021-09-12 NOTE — Telephone Encounter (Signed)
Please let her know that I checked and since she has been diagnosed with breast cancer she doesn't qualify for lung cancer screening program anymore.  Dr. Delton Coombes with oncology will monitor her chest imaging as part of breast cancer surveillance.   ?

## 2021-09-12 NOTE — Patient Instructions (Addendum)
Chest xray today at [facility] ? ?Will arrange for overnight oxygen test on 3 liters ? ?Will arrange for pulmonary function test in [place] on same day you have follow up in 3 months ?

## 2021-09-13 NOTE — Telephone Encounter (Signed)
Called and notified patient. She voiced understanding. Nothing further needed at this time  ?

## 2021-09-16 ENCOUNTER — Inpatient Hospital Stay (HOSPITAL_COMMUNITY): Payer: Medicare Other | Admitting: Dietician

## 2021-09-16 ENCOUNTER — Inpatient Hospital Stay (HOSPITAL_COMMUNITY): Payer: Medicare Other | Attending: Hematology

## 2021-09-16 ENCOUNTER — Inpatient Hospital Stay (HOSPITAL_COMMUNITY): Payer: Medicare Other

## 2021-09-16 ENCOUNTER — Inpatient Hospital Stay (HOSPITAL_BASED_OUTPATIENT_CLINIC_OR_DEPARTMENT_OTHER): Payer: Medicare Other | Admitting: Hematology

## 2021-09-16 VITALS — Ht 60.0 in | Wt 156.1 lb

## 2021-09-16 VITALS — BP 141/61 | HR 85 | Temp 98.3°F | Resp 18

## 2021-09-16 DIAGNOSIS — C50412 Malignant neoplasm of upper-outer quadrant of left female breast: Secondary | ICD-10-CM

## 2021-09-16 DIAGNOSIS — Z170 Estrogen receptor positive status [ER+]: Secondary | ICD-10-CM | POA: Diagnosis not present

## 2021-09-16 DIAGNOSIS — M810 Age-related osteoporosis without current pathological fracture: Secondary | ICD-10-CM | POA: Diagnosis not present

## 2021-09-16 DIAGNOSIS — Z5111 Encounter for antineoplastic chemotherapy: Secondary | ICD-10-CM | POA: Insufficient documentation

## 2021-09-16 DIAGNOSIS — Z79899 Other long term (current) drug therapy: Secondary | ICD-10-CM | POA: Insufficient documentation

## 2021-09-16 DIAGNOSIS — D630 Anemia in neoplastic disease: Secondary | ICD-10-CM

## 2021-09-16 DIAGNOSIS — Z95828 Presence of other vascular implants and grafts: Secondary | ICD-10-CM

## 2021-09-16 LAB — COMPREHENSIVE METABOLIC PANEL
ALT: 16 U/L (ref 0–44)
AST: 20 U/L (ref 15–41)
Albumin: 3 g/dL — ABNORMAL LOW (ref 3.5–5.0)
Alkaline Phosphatase: 88 U/L (ref 38–126)
Anion gap: 6 (ref 5–15)
BUN: 8 mg/dL (ref 8–23)
CO2: 32 mmol/L (ref 22–32)
Calcium: 8.5 mg/dL — ABNORMAL LOW (ref 8.9–10.3)
Chloride: 102 mmol/L (ref 98–111)
Creatinine, Ser: 0.58 mg/dL (ref 0.44–1.00)
GFR, Estimated: >60 mL/min (ref >60)
Glucose, Bld: 184 mg/dL — ABNORMAL HIGH (ref 70–99)
Potassium: 3.6 mmol/L (ref 3.5–5.1)
Sodium: 140 mmol/L (ref 135–145)
Total Bilirubin: 0.3 mg/dL (ref 0.3–1.2)
Total Protein: 5.9 g/dL — ABNORMAL LOW (ref 6.5–8.1)

## 2021-09-16 LAB — CBC WITH DIFFERENTIAL/PLATELET
Abs Immature Granulocytes: 0.07 10*3/uL (ref 0.00–0.07)
Basophils Absolute: 0.1 10*3/uL (ref 0.0–0.1)
Basophils Relative: 0 %
Eosinophils Absolute: 0 10*3/uL (ref 0.0–0.5)
Eosinophils Relative: 0 %
HCT: 30.6 % — ABNORMAL LOW (ref 36.0–46.0)
Hemoglobin: 9.5 g/dL — ABNORMAL LOW (ref 12.0–15.0)
Immature Granulocytes: 1 %
Lymphocytes Relative: 16 %
Lymphs Abs: 1.9 10*3/uL (ref 0.7–4.0)
MCH: 32.1 pg (ref 26.0–34.0)
MCHC: 31 g/dL (ref 30.0–36.0)
MCV: 103.4 fL — ABNORMAL HIGH (ref 80.0–100.0)
Monocytes Absolute: 1.2 10*3/uL — ABNORMAL HIGH (ref 0.1–1.0)
Monocytes Relative: 10 %
Neutro Abs: 8.7 10*3/uL — ABNORMAL HIGH (ref 1.7–7.7)
Neutrophils Relative %: 73 %
Platelets: 371 10*3/uL (ref 150–400)
RBC: 2.96 MIL/uL — ABNORMAL LOW (ref 3.87–5.11)
RDW: 14.6 % (ref 11.5–15.5)
WBC: 12 10*3/uL — ABNORMAL HIGH (ref 4.0–10.5)
nRBC: 0 % (ref 0.0–0.2)

## 2021-09-16 LAB — MAGNESIUM: Magnesium: 1.9 mg/dL (ref 1.7–2.4)

## 2021-09-16 MED ORDER — SODIUM CHLORIDE 0.9 % IV SOLN
60.0000 mg/m2 | Freq: Once | INTRAVENOUS | Status: AC
  Administered 2021-09-16: 100 mg via INTRAVENOUS
  Filled 2021-09-16: qty 10

## 2021-09-16 MED ORDER — HEPARIN SOD (PORK) LOCK FLUSH 100 UNIT/ML IV SOLN
500.0000 [IU] | Freq: Once | INTRAVENOUS | Status: AC | PRN
  Administered 2021-09-16: 500 [IU]

## 2021-09-16 MED ORDER — PALONOSETRON HCL INJECTION 0.25 MG/5ML
0.2500 mg | Freq: Once | INTRAVENOUS | Status: AC
  Administered 2021-09-16: 0.25 mg via INTRAVENOUS
  Filled 2021-09-16: qty 5

## 2021-09-16 MED ORDER — SODIUM CHLORIDE 0.9% FLUSH
10.0000 mL | INTRAVENOUS | Status: DC | PRN
  Administered 2021-09-16: 10 mL

## 2021-09-16 MED ORDER — SODIUM CHLORIDE 0.9 % IV SOLN: Freq: Once | INTRAVENOUS | Status: AC

## 2021-09-16 MED ORDER — SODIUM CHLORIDE 0.9 % IV SOLN
480.0000 mg/m2 | Freq: Once | INTRAVENOUS | Status: AC
  Administered 2021-09-16: 840 mg via INTRAVENOUS
  Filled 2021-09-16: qty 42

## 2021-09-16 MED ORDER — SODIUM CHLORIDE 0.9 % IV SOLN
10.0000 mg | Freq: Once | INTRAVENOUS | Status: AC
  Administered 2021-09-16: 10 mg via INTRAVENOUS
  Filled 2021-09-16: qty 10

## 2021-09-16 NOTE — Progress Notes (Signed)
Patients port flushed without difficulty.  Good blood return noted with no bruising or swelling noted at site.  Stable during access and blood draw.  To remain accessed for treatment.   

## 2021-09-16 NOTE — Patient Instructions (Signed)
[facility] Discharge Instructions   You were seen and examined today by Dr. Katragadda.  He reviewed your lab work which is normal/stable.   We will proceed with your treatment today.  Return as scheduled.    Thank you for choosing [facility] at [facility] to provide your oncology and hematology care.  To afford each patient quality time with our provider, please arrive at least 15 minutes before your scheduled appointment time.   If you have a lab appointment with the Cancer Center please come in thru the Main Entrance and check in at the main information desk.  You need to re-schedule your appointment should you arrive 10 or more minutes late.  We strive to give you quality time with our providers, and arriving late affects you and other patients whose appointments are after yours.  Also, if you no show three or more times for appointments you may be dismissed from the clinic at the providers discretion.     Again, thank you for choosing [facility].  Our hope is that these requests will decrease the amount of time that you wait before being seen by our physicians.       _____________________________________________________________  Should you have questions after your visit to [facility], please contact our office at [phone] and follow the prompts.  Our office hours are 8:00 a.m. and 4:30 p.m. Monday - Friday.  Please note that voicemails left after 4:00 p.m. may not be returned until the following business day.  We are closed weekends and major holidays.  You do have access to a nurse 24-7, just call the main number to the clinic [phone] and do not press any options, hold on the line and a nurse will answer the phone.    For prescription refill requests, have your pharmacy contact our office and allow 72 hours.    Due to Covid, you will need to wear a mask upon entering the hospital. If  you do not have a mask, a mask will be given to you at the Main Entrance upon arrival. For doctor visits, patients may have 1 support person age 18 or older with them. For treatment visits, patients can not have anyone with them due to social distancing guidelines and our immunocompromised population.      

## 2021-09-16 NOTE — Progress Notes (Signed)
Patient has been examined by Dr. Katragadda, and vital signs and labs have been reviewed. ANC, Creatinine, LFTs, hemoglobin, and platelets are within treatment parameters per M.D. - pt may proceed with treatment.    °

## 2021-09-16 NOTE — Progress Notes (Signed)
Nutrition Follow-up: ? ? ?Patient with breast cancer. She is receiving chemotherapy with taxotere + cytoxan q21d. ? ?Met with patient during infusion. She continues to have a good appetite, reports 3 meals daily with good sources of protein. Patient denies nausea or vomiting. She is now having regular bowel movements everyday. Patient is "hoping treatment doesn't mess it up." She has not tried oral supplement samples.They are in her fridge.  ? ?Medications: reviewed  ? ?Labs: glucose 184 ? ?Anthropometrics: Weight 156 lb 1.6 oz today increased ? ?4/27 - 151 lb 6.4 oz  ?4/14 - 155 lb  ?4/10 - 165 lb 12.8 oz (?) ?4/3 - 149 lb 6.4 oz  ? ?NUTRITION DIAGNOSIS: Inadequate oral intake stable  ? ? ?INTERVENTION:  ?Continue high calorie high protein foods for weight maintenance ?  ? ?MONITORING, EVALUATION, GOAL: weight trends, intake  ? ? ?NEXT VISIT: To be scheduled as needed  ? ? ? ?

## 2021-09-16 NOTE — Progress Notes (Signed)
? ?[facility] ?[address] ?[place], [place] [address] ? ? ?CLINIC:  ?Medical Oncology/Hematology ? ?PCP:  ?Inda Coke, [place] ?[place] [id number] ?[phone] ? ? 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 ?PRIOR THERAPY: none ? ?NGS Results: not done ? ?CURRENT THERAPY: Adjuvant chemotherapy with 4 cycles of TC followed by XRT plus AI ? ?BRIEF ONCOLOGIC HISTORY:  ?Oncology History  ?Malignant neoplasm of upper-outer quadrant of left breast in female, estrogen receptor positive ([place])  ?05/14/2021 Initial Diagnosis  ? Malignant neoplasm of upper-outer quadrant of left breast in female, estrogen receptor positive ([place]) ? ?  ?08/05/2021 -  Chemotherapy  ? Patient is on Treatment Plan : BREAST TC q21d  ? ?  ?  ? Genetic Testing  ? Negative genetic testing. No pathogenic variants identified on the [facility] Multi-Cancer+RNA panel. The report date is 07/25/2021. ? ?The Multi-Cancer Panel + RNA offered by [facility] includes sequencing and/or deletion duplication testing of the following 84 genes: AIP, ALK, APC, ATM, AXIN2,BAP1,  BARD1, BLM, BMPR1A, BRCA1, BRCA2, BRIP1, CASR, CDC73, CDH1, CDK4, CDKN1B, CDKN1C, CDKN2A (p14ARF), CDKN2A (p16INK4a), CEBPA, CHEK2, CTNNA1, DICER1, DIS3L2, EGFR (c.2369C>T, p.Thr790Met variant only), EPCAM (Deletion/duplication testing only), FH, FLCN, GATA2, GPC3, GREM1 (Promoter region deletion/duplication testing only), HOXB13 (c.251G>A, p.Gly84Glu), HRAS, KIT, MAX, MEN1, MET, MITF (c.952G>A, p.Glu318Lys variant only), MLH1, MSH2, MSH3, MSH6, MUTYH, NBN, NF1, NF2, NTHL1, PALB2, PDGFRA, PHOX2B, PMS2, POLD1, POLE, POT1, PRKAR1A, PTCH1, PTEN, RAD50, RAD51C, RAD51D, RB1, RECQL4, RET, RUNX1, SDHAF2, SDHA (sequence changes only), SDHB, SDHC, SDHD, SMAD4, SMARCA4, SMARCB1, SMARCE1, STK11, SUFU, TERC, TERT, TMEM127, TP53, TSC1, TSC2, VHL, WRN and WT1. ?  ? ? ?CANCER STAGING: ? Cancer Staging  ?Malignant neoplasm of  upper-outer quadrant of left breast in female, estrogen receptor positive ([place]) ?Staging form: Breast, AJCC 8th Edition ?- Clinical stage from 07/01/2021: Stage IB (cT1b, cN0, cM0, G3, ER+, PR-, HER2-) - Unsigned ? ? ?INTERVAL HISTORY:  ?Ms. Jami Finnigan, a 66 y.o. female, returns for routine follow-up and consideration for next cycle of chemotherapy. Inika was last seen on 08/26/2021. ? ?Due for cycle #3 of TC today.  ? ?Overall, she tells me she has been feeling pretty well. She reports episodes throughout the day of "shaking and jerking" which is causing her to drop items; she reports constant tremors is her hands. She denies numbness in her hand and feet. She reports weakness in her hands. She presented to the ED on 4/14 for pneumonia. She reports occasional productive cough. She quit smoking 3 weeks ago, and she is currently vaping. She is using albuterol and SPIRIVA  inhaler. She is eating well. She denies nausea and vomiting.  ? ?Overall, she feels ready for next cycle of chemo today.  ? ?REVIEW OF SYSTEMS:  ?Review of Systems  ?Constitutional:  Negative for appetite change and fatigue.  ?Gastrointestinal:  Negative for nausea and vomiting.  ?Musculoskeletal:  Positive for back pain (6/10 lower).  ?Neurological:  Positive for extremity weakness (hands). Negative for numbness.  ?All other systems reviewed and are negative. ? ?PAST MEDICAL/SURGICAL HISTORY:  ?Past Medical History:  ?Diagnosis Date  ? Anxiety   ? Arthritis   ? Asthma   ? Chronic back pain   ? Colon polyps   ? COPD (chronic obstructive pulmonary disease) ([place])   ? Depression   ? Emphysema of lung ([place])   ? Family history of breast cancer   ? GERD (gastroesophageal reflux  disease)   ? Memory change   ? Port-A-Cath in place 08/01/2021  ? Sleep apnea   ? TIA (transient ischemic attack)   ? Tremor   ? ?Past Surgical History:  ?Procedure Laterality Date  ? ABDOMINAL HYSTERECTOMY    ? BREAST LUMPECTOMY WITH RADIOFREQUENCY TAG IDENTIFICATION  Left 05/31/2021  ? Procedure: BREAST LUMPECTOMY WITH RADIOFREQUENCY TAG IDENTIFICATION;  Surgeon: Virl Cagey, MD;  Location: [facility];  Service: General;  Laterality: Left;  ? CERVICAL SPINE SURGERY    ? INNER EAR SURGERY    ? LUMBAR FUSION  08-22-10  ? L3-L7  ? PARTIAL MASTECTOMY WITH AXILLARY SENTINEL LYMPH NODE BIOPSY Left 05/31/2021  ? Procedure: PARTIAL MASTECTOMY WITH AXILLARY SENTINEL LYMPH NODE BIOPSY;  Surgeon: Virl Cagey, MD;  Location: [facility];  Service: General;  Laterality: Left;  ? PORTACATH PLACEMENT Right 07/30/2021  ? Procedure: INSERTION PORT-A-CATH;  Surgeon: Virl Cagey, MD;  Location: [facility];  Service: General;  Laterality: Right;  ? ? ?SOCIAL HISTORY:  ?Social History  ? ?Socioeconomic History  ? Marital status: Widowed  ?  Spouse name: Not on file  ? Number of children: 1  ? Years of education: 62  ? Highest education level: 11th grade  ?Occupational History  ? Not on file  ?Tobacco Use  ? Smoking status: Former  ?  Packs/day: 0.50  ?  Types: Cigarettes  ?  Quit date: 08/17/2021  ?  Years since quitting: 0.0  ? Smokeless tobacco: Never  ? Tobacco comments:  ?  Used to smoke a pack a day, Not smoking cigarettes but is vaping  09/12/2021  ?Vaping Use  ? Vaping Use: Every day  ? Substances: Flavoring  ?Substance and Sexual Activity  ? Alcohol use: Not Currently  ? Drug use: Not Currently  ?  Types: Other-see comments  ?  Comment: CBD and medical marijuana- denies 8/28/2,04/22/21  ? Sexual activity: Not Currently  ?Other Topics Concern  ? Not on file  ?Social History Narrative  ? 04/22/21 Lives alone  ? From [place], moved to [place] in Jul 25, 2019  ? Husband passed away in August 21, 2016  ? ?Social Determinants of Health  ? ?Financial Resource Strain: Not on file  ?Food Insecurity: Not on file  ?Transportation Needs: Not on file  ?Physical Activity: Not on file  ?Stress: Not on file  ?Social Connections: Not on file  ?Intimate Partner Violence: Not on file  ? ? ?FAMILY HISTORY:  ?Family History   ?Problem Relation Age of Onset  ? Depression Mother   ? Diabetes Mother   ? Hypertension Mother   ? Hyperlipidemia Mother   ? Heart attack Mother   ? Osteoarthritis Father   ? Asthma Father   ? COPD Father   ? Breast cancer Sister   ?     dx 14s-60s  ? Lupus Sister   ? Osteoarthritis Sister   ? Asthma Sister   ? COPD Sister   ? Diabetes Sister   ? Drug abuse Sister   ? Heart attack Sister   ? Heart attack Maternal Grandmother   ? Colon cancer Neg Hx   ? Esophageal cancer Neg Hx   ? ? ?CURRENT MEDICATIONS:  ?Current Outpatient Medications  ?Medication Sig Dispense Refill  ? albuterol (PROVENTIL) (2.5 MG/3ML) 0.083% nebulizer solution Take 3 mLs (2.5 mg total) by nebulization every 6 (six) hours as needed for wheezing or shortness of breath. 150 mL 1  ? albuterol (VENTOLIN HFA) 108 (90 Base) MCG/ACT inhaler  INHALE 1 TO 2 PUFFS INTO THE LUNGS EVERY 6 HOURS AS NEEDED FOR WHEEZING OR SHORTNESS OF BREATH (Patient taking differently: 1-2 puffs every 6 (six) hours as needed for wheezing or shortness of breath.) 18 g 5  ? atorvastatin (LIPITOR) 20 MG tablet Take 1 tablet (20 mg total) by mouth daily. 90 tablet 3  ? budesonide-formoterol (SYMBICORT) 160-4.5 MCG/ACT inhaler Inhale 2 puffs into the lungs 2 (two) times daily. 10.2 g 11  ? celecoxib (CELEBREX) 100 MG capsule TAKE 1 CAPSULE(100 MG) BY MOUTH TWICE DAILY (Patient taking differently: Take 100 mg by mouth daily.) 180 capsule 1  ? cyclobenzaprine (FLEXERIL) 10 MG tablet Take 1 tablet (10 mg total) by mouth 3 (three) times daily. 90 tablet 1  ? CYCLOPHOSPHAMIDE IV Inject into the vein every 21 ( twenty-one) days. X 4 cycles    ? DOCETAXEL IV Inject into the vein every 21 ( twenty-one) days. X 4 cycles    ? escitalopram (LEXAPRO) 20 MG tablet TAKE 1 TABLET(20 MG) BY MOUTH DAILY (Patient taking differently: Take 20 mg by mouth daily.) 90 tablet 1  ? furosemide (LASIX) 20 MG tablet Take 20 mg by mouth daily as needed for fluid.    ? gabapentin (NEURONTIN) 600 MG tablet  Take 1 tablet in AM, 1 tablet at noon, and 2 tablets at bedtime. (Patient taking differently: Take 600-1,200 mg by mouth See admin instructions. Take 1 tablet by mouth in the morning, 1 tablet at noon and 2 tablets at b

## 2021-09-16 NOTE — Patient Instructions (Signed)
[facility]  Discharge Instructions: ?Thank you for choosing [facility] to provide your oncology and hematology care.  ?If you have a lab appointment with the [facility], please come in thru the Main Entrance and check in at the main information desk. ? ?Wear comfortable clothing and clothing appropriate for easy access to any Portacath or PICC line.  ? ?We strive to give you quality time with your provider. You may need to reschedule your appointment if you arrive late (15 or more minutes).  Arriving late affects you and other patients whose appointments are after yours.  Also, if you miss three or more appointments without notifying the office, you may be dismissed from the clinic at the provider?s discretion.    ?  ?For prescription refill requests, have your pharmacy contact our office and allow 72 hours for refills to be completed.   ? ?Today you received the following chemotherapy and/or immunotherapy agents Taxotere/Cytoxan.     ?  ?To help prevent nausea and vomiting after your treatment, we encourage you to take your nausea medication as directed. ? ?BELOW ARE SYMPTOMS THAT SHOULD BE REPORTED IMMEDIATELY: ?*FEVER GREATER THAN 100.4 F (38 ?C) OR HIGHER ?*CHILLS OR SWEATING ?*NAUSEA AND VOMITING THAT IS NOT CONTROLLED WITH YOUR NAUSEA MEDICATION ?*UNUSUAL SHORTNESS OF BREATH ?*UNUSUAL BRUISING OR BLEEDING ?*URINARY PROBLEMS (pain or burning when urinating, or frequent urination) ?*BOWEL PROBLEMS (unusual diarrhea, constipation, pain near the anus) ?TENDERNESS IN MOUTH AND THROAT WITH OR WITHOUT PRESENCE OF ULCERS (sore throat, sores in mouth, or a toothache) ?UNUSUAL RASH, SWELLING OR PAIN  ?UNUSUAL VAGINAL DISCHARGE OR ITCHING  ? ?Items with * indicate a potential emergency and should be followed up as soon as possible or go to the Emergency Department if any problems should occur. ? ?Please show the CHEMOTHERAPY ALERT CARD or IMMUNOTHERAPY ALERT CARD at check-in to the  Emergency Department and triage nurse. ? ?Should you have questions after your visit or need to cancel or reschedule your appointment, please contact [facility] [phone]  and follow the prompts.  Office hours are 8:00 a.m. to 4:30 p.m. Monday - Friday. Please note that voicemails left after 4:00 p.m. may not be returned until the following business day.  We are closed weekends and major holidays. You have access to a nurse at all times for urgent questions. Please call the main number to the clinic [phone] and follow the prompts. ? ?For any non-urgent questions, you may also contact your provider using MyChart. We now offer e-Visits for anyone 33 and older to request care online for non-urgent symptoms. For details visit [email or web address]. ?  ?Also download the MyChart app! Go to the app store, search "MyChart", open the app, select [facility], and log in with your MyChart username and password. ? ?Due to Covid, a mask is required upon entering the hospital/clinic. If you do not have a mask, one will be given to you upon arrival. For doctor visits, patients may have 1 support person aged 21 or older with them. For treatment visits, patients cannot have anyone with them due to current Covid guidelines and our immunocompromised population.  ?

## 2021-09-16 NOTE — Progress Notes (Signed)
Patient presents today for treatment and follow up visit with Dr. Delton Coombes. Labs within parameters for today's treatment. Vital signs stable. Heart rate 105 on arrival and MD aware.  ? ?Treatment given today per MD orders. Tolerated infusion without adverse affects. Vital signs stable. No complaints at this time. Discharged from clinic ambulatory in stable condition. Alert and oriented x 3. F/U with [facility] as scheduled.   ?

## 2021-09-18 ENCOUNTER — Inpatient Hospital Stay (HOSPITAL_COMMUNITY): Payer: Medicare Other

## 2021-09-18 VITALS — BP 114/57 | HR 97 | Temp 98.6°F | Resp 18

## 2021-09-18 DIAGNOSIS — C50412 Malignant neoplasm of upper-outer quadrant of left female breast: Secondary | ICD-10-CM

## 2021-09-18 DIAGNOSIS — Z95828 Presence of other vascular implants and grafts: Secondary | ICD-10-CM

## 2021-09-18 DIAGNOSIS — Z5111 Encounter for antineoplastic chemotherapy: Secondary | ICD-10-CM | POA: Diagnosis not present

## 2021-09-18 MED ORDER — PEGFILGRASTIM-BMEZ 6 MG/0.6ML ~~LOC~~ SOSY
6.0000 mg | PREFILLED_SYRINGE | Freq: Once | SUBCUTANEOUS | Status: AC
  Administered 2021-09-18: 6 mg via SUBCUTANEOUS
  Filled 2021-09-18: qty 0.6

## 2021-09-18 NOTE — Progress Notes (Signed)
Holly Phillips presents today for injection per the provider's orders.  Ziextenzo administration without incident; injection site WNL; see MAR for injection details.  Patient tolerated procedure well and without incident.  No questions or complaints noted at this time.  ? ?Discharged from clinic ambulatory in stable condition. Alert and oriented x 3. F/U with [facility] as scheduled.   ?

## 2021-09-18 NOTE — Patient Instructions (Signed)
[place]  Discharge Instructions: ?Thank you for choosing [place] to provide your oncology and hematology care.  ?If you have a lab appointment with the [facility], please come in thru the Main Entrance and check in at the main information desk. ? ?Wear comfortable clothing and clothing appropriate for easy access to any Portacath or PICC line.  ? ?We strive to give you quality time with your provider. You may need to reschedule your appointment if you arrive late (15 or more minutes).  Arriving late affects you and other patients whose appointments are after yours.  Also, if you miss three or more appointments without notifying the office, you may be dismissed from the clinic at the provider?s discretion.    ?  ?For prescription refill requests, have your pharmacy contact our office and allow 72 hours for refills to be completed.   ? ?Today you received Ziextenzo injection today. ?  ? ? ?BELOW ARE SYMPTOMS THAT SHOULD BE REPORTED IMMEDIATELY: ?*FEVER GREATER THAN 100.4 F (38 ?C) OR HIGHER ?*CHILLS OR SWEATING ?*NAUSEA AND VOMITING THAT IS NOT CONTROLLED WITH YOUR NAUSEA MEDICATION ?*UNUSUAL SHORTNESS OF BREATH ?*UNUSUAL BRUISING OR BLEEDING ?*URINARY PROBLEMS (pain or burning when urinating, or frequent urination) ?*BOWEL PROBLEMS (unusual diarrhea, constipation, pain near the anus) ?TENDERNESS IN MOUTH AND THROAT WITH OR WITHOUT PRESENCE OF ULCERS (sore throat, sores in mouth, or a toothache) ?UNUSUAL RASH, SWELLING OR PAIN  ?UNUSUAL VAGINAL DISCHARGE OR ITCHING  ? ?Items with * indicate a potential emergency and should be followed up as soon as possible or go to the Emergency Department if any problems should occur. ? ?Please show the CHEMOTHERAPY ALERT CARD or IMMUNOTHERAPY ALERT CARD at check-in to the Emergency Department and triage nurse. ? ?Should you have questions after your visit or need to cancel or reschedule your appointment, please contact [facility]  [phone]  and follow the prompts.  Office hours are 8:00 a.m. to 4:30 p.m. Monday - Friday. Please note that voicemails left after 4:00 p.m. may not be returned until the following business day.  We are closed weekends and major holidays. You have access to a nurse at all times for urgent questions. Please call the main number to the clinic [phone] and follow the prompts. ? ?For any non-urgent questions, you may also contact your provider using MyChart. We now offer e-Visits for anyone 98 and older to request care online for non-urgent symptoms. For details visit [email or web address]. ?  ?Also download the MyChart app! Go to the app store, search "MyChart", open the app, select [place], and log in with your MyChart username and password. ? ?Due to Covid, a mask is required upon entering the hospital/clinic. If you do not have a mask, one will be given to you upon arrival. For doctor visits, patients may have 1 support person aged 53 or older with them. For treatment visits, patients cannot have anyone with them due to current Covid guidelines and our immunocompromised population.  ?

## 2021-10-07 ENCOUNTER — Inpatient Hospital Stay (HOSPITAL_COMMUNITY): Payer: Medicare Other

## 2021-10-07 ENCOUNTER — Inpatient Hospital Stay (HOSPITAL_BASED_OUTPATIENT_CLINIC_OR_DEPARTMENT_OTHER): Payer: Medicare Other | Admitting: Hematology

## 2021-10-07 ENCOUNTER — Telehealth: Payer: Self-pay | Admitting: Radiation Oncology

## 2021-10-07 VITALS — BP 112/79 | HR 98 | Temp 96.6°F | Resp 20

## 2021-10-07 DIAGNOSIS — E876 Hypokalemia: Secondary | ICD-10-CM

## 2021-10-07 DIAGNOSIS — Z95828 Presence of other vascular implants and grafts: Secondary | ICD-10-CM

## 2021-10-07 DIAGNOSIS — Z5111 Encounter for antineoplastic chemotherapy: Secondary | ICD-10-CM | POA: Diagnosis not present

## 2021-10-07 DIAGNOSIS — Z170 Estrogen receptor positive status [ER+]: Secondary | ICD-10-CM

## 2021-10-07 DIAGNOSIS — D630 Anemia in neoplastic disease: Secondary | ICD-10-CM

## 2021-10-07 DIAGNOSIS — C50412 Malignant neoplasm of upper-outer quadrant of left female breast: Secondary | ICD-10-CM

## 2021-10-07 LAB — CBC WITH DIFFERENTIAL/PLATELET
Abs Immature Granulocytes: 0.02 10*3/uL (ref 0.00–0.07)
Basophils Absolute: 0.1 10*3/uL (ref 0.0–0.1)
Basophils Relative: 1 %
Eosinophils Absolute: 0 10*3/uL (ref 0.0–0.5)
Eosinophils Relative: 0 %
HCT: 30.8 % — ABNORMAL LOW (ref 36.0–46.0)
Hemoglobin: 9.3 g/dL — ABNORMAL LOW (ref 12.0–15.0)
Immature Granulocytes: 0 %
Lymphocytes Relative: 25 %
Lymphs Abs: 1.9 10*3/uL (ref 0.7–4.0)
MCH: 30.5 pg (ref 26.0–34.0)
MCHC: 30.2 g/dL (ref 30.0–36.0)
MCV: 101 fL — ABNORMAL HIGH (ref 80.0–100.0)
Monocytes Absolute: 1 10*3/uL (ref 0.1–1.0)
Monocytes Relative: 13 %
Neutro Abs: 4.7 10*3/uL (ref 1.7–7.7)
Neutrophils Relative %: 61 %
Platelets: 309 10*3/uL (ref 150–400)
RBC: 3.05 MIL/uL — ABNORMAL LOW (ref 3.87–5.11)
RDW: 15.3 % (ref 11.5–15.5)
WBC: 7.7 10*3/uL (ref 4.0–10.5)
nRBC: 0 % (ref 0.0–0.2)

## 2021-10-07 LAB — MAGNESIUM: Magnesium: 1.2 mg/dL — ABNORMAL LOW (ref 1.7–2.4)

## 2021-10-07 LAB — COMPREHENSIVE METABOLIC PANEL
ALT: 11 U/L (ref 0–44)
AST: 15 U/L (ref 15–41)
Albumin: 3.1 g/dL — ABNORMAL LOW (ref 3.5–5.0)
Alkaline Phosphatase: 68 U/L (ref 38–126)
Anion gap: 5 (ref 5–15)
BUN: 8 mg/dL (ref 8–23)
CO2: 31 mmol/L (ref 22–32)
Calcium: 8.4 mg/dL — ABNORMAL LOW (ref 8.9–10.3)
Chloride: 105 mmol/L (ref 98–111)
Creatinine, Ser: 0.61 mg/dL (ref 0.44–1.00)
GFR, Estimated: >60 mL/min (ref >60)
Glucose, Bld: 173 mg/dL — ABNORMAL HIGH (ref 70–99)
Potassium: 3.3 mmol/L — ABNORMAL LOW (ref 3.5–5.1)
Sodium: 141 mmol/L (ref 135–145)
Total Bilirubin: 0.5 mg/dL (ref 0.3–1.2)
Total Protein: 5.7 g/dL — ABNORMAL LOW (ref 6.5–8.1)

## 2021-10-07 LAB — FERRITIN: Ferritin: 75 ng/mL (ref 11–307)

## 2021-10-07 LAB — IRON AND TIBC
Iron: 49 ug/dL (ref 28–170)
Saturation Ratios: 17 % (ref 10.4–31.8)
TIBC: 285 ug/dL (ref 250–450)
UIBC: 236 ug/dL

## 2021-10-07 LAB — VITAMIN B12: Vitamin B-12: 2194 pg/mL — ABNORMAL HIGH (ref 180–914)

## 2021-10-07 LAB — FOLATE: Folate: 13.2 ng/mL (ref >5.9)

## 2021-10-07 MED ORDER — MAGNESIUM SULFATE 2 GM/50ML IV SOLN
2.0000 g | INTRAVENOUS | Status: AC
  Administered 2021-10-07 (×2): 2 g via INTRAVENOUS
  Filled 2021-10-07 (×2): qty 50

## 2021-10-07 MED ORDER — SODIUM CHLORIDE 0.9% FLUSH
10.0000 mL | INTRAVENOUS | Status: DC | PRN
  Administered 2021-10-07: 10 mL

## 2021-10-07 MED ORDER — SODIUM CHLORIDE 0.9 % IV SOLN
60.0000 mg/m2 | Freq: Once | INTRAVENOUS | Status: AC
  Administered 2021-10-07: 100 mg via INTRAVENOUS
  Filled 2021-10-07: qty 10

## 2021-10-07 MED ORDER — PALONOSETRON HCL INJECTION 0.25 MG/5ML
0.2500 mg | Freq: Once | INTRAVENOUS | Status: AC
  Administered 2021-10-07: 0.25 mg via INTRAVENOUS
  Filled 2021-10-07: qty 5

## 2021-10-07 MED ORDER — SODIUM CHLORIDE 0.9 % IV SOLN
480.0000 mg/m2 | Freq: Once | INTRAVENOUS | Status: AC
  Administered 2021-10-07: 840 mg via INTRAVENOUS
  Filled 2021-10-07: qty 42

## 2021-10-07 MED ORDER — POTASSIUM CHLORIDE CRYS ER 20 MEQ PO TBCR
40.0000 meq | EXTENDED_RELEASE_TABLET | Freq: Once | ORAL | Status: AC
  Administered 2021-10-07: 40 meq via ORAL
  Filled 2021-10-07: qty 2

## 2021-10-07 MED ORDER — SODIUM CHLORIDE 0.9 % IV SOLN
10.0000 mg | Freq: Once | INTRAVENOUS | Status: AC
  Administered 2021-10-07: 10 mg via INTRAVENOUS
  Filled 2021-10-07: qty 10

## 2021-10-07 MED ORDER — SODIUM CHLORIDE 0.9 % IV SOLN: Freq: Once | INTRAVENOUS | Status: AC

## 2021-10-07 MED ORDER — HEPARIN SOD (PORK) LOCK FLUSH 100 UNIT/ML IV SOLN
500.0000 [IU] | Freq: Once | INTRAVENOUS | Status: AC | PRN
  Administered 2021-10-07: 500 [IU]

## 2021-10-07 MED ORDER — ALTEPLASE 2 MG IJ SOLR
2.0000 mg | Freq: Once | INTRAMUSCULAR | Status: AC
  Administered 2021-10-07: 2 mg
  Filled 2021-10-07: qty 2

## 2021-10-07 NOTE — Progress Notes (Signed)
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 No pathogenic variants identified on the [facility] Multi-Cancer+RNA panel. The report date is 07/25/2021.  The Multi-Cancer Panel + RNA offered by [facility] includes sequencing and/or deletion duplication testing of the following 84 genes: AIP, ALK, APC, ATM, AXIN2,BAP1,  BARD1, BLM, BMPR1A, BRCA1, BRCA2, BRIP1, CASR, CDC73, CDH1, CDK4, CDKN1B, CDKN1C, CDKN2A (p14ARF), CDKN2A (p16INK4a), CEBPA, CHEK2, CTNNA1, DICER1, DIS3L2, EGFR (c.2369C>T, p.Thr790Met variant only), EPCAM (Deletion/duplication testing only), FH, FLCN, GATA2, GPC3, GREM1 (Promoter region deletion/duplication testing only), HOXB13 (c.251G>A, p.Gly84Glu), HRAS, KIT, MAX, MEN1, MET, MITF (c.952G>A, p.Glu318Lys variant only), MLH1, MSH2, MSH3, MSH6, MUTYH, NBN, NF1, NF2, NTHL1, PALB2, PDGFRA, PHOX2B, PMS2, POLD1, POLE, POT1, PRKAR1A, PTCH1, PTEN, RAD50, RAD51C, RAD51D, RB1, RECQL4, RET, RUNX1, SDHAF2, SDHA (sequence changes only), SDHB, SDHC, SDHD, SMAD4, SMARCA4, SMARCB1, SMARCE1, STK11, SUFU, TERC, TERT, TMEM127, TP53, TSC1, TSC2, VHL, WRN and WT1.     CANCER STAGING:  Cancer Staging  Malignant neoplasm of upper-outer  quadrant of left breast in female, estrogen receptor positive ([place]) Staging form: Breast, AJCC 8th Edition - Clinical stage from 07/01/2021: Stage IB (cT1b, cN0, cM0, G3, ER+, PR-, HER2-) - Unsigned   INTERVAL HISTORY:  [name], a 66 y.o. female, returns for routine follow-up and consideration for next cycle of chemotherapy. Holly Phillips was last seen on 09/16/2021.  Due for cycle #4 of TC today.   Overall, she tells me she has been feeling pretty well. She reports peeling of the skin on the top of her hands and soles of her feet; she denies associated numbness or pain. She denies nausea and vomiting. Her SOB is stable. She denies fevers, night sweats, infections, and hematuria. She has only been taking magnesium once daily. She takes calcium and vitamin D. She denies muscle cramps.   Overall, she feels ready for next cycle of chemo today.    REVIEW OF SYSTEMS:  Review of Systems  Constitutional:  Negative for appetite change, fatigue and fever.  Respiratory:  Positive for shortness of breath (stable).   Cardiovascular:  Positive for chest pain and palpitations.  Gastrointestinal:  Negative for nausea and vomiting.  Endocrine: Negative for hot flashes.  Genitourinary:  Negative for hematuria.   Musculoskeletal:  Negative for myalgias.  Neurological:  Negative for numbness.  All other systems reviewed and are negative.  PAST MEDICAL/SURGICAL HISTORY:  Past Medical History:  Diagnosis Date   Anxiety    Arthritis    Asthma    Chronic back pain    Colon polyps    COPD (chronic obstructive pulmonary disease) (HCC)    Depression    Emphysema of lung (HCC)    Family history of breast cancer  GERD (gastroesophageal reflux disease)    Memory change    Port-A-Cath in place 08/01/2021   Sleep apnea    TIA (transient ischemic attack)    Tremor    Past Surgical History:  Procedure Laterality Date   ABDOMINAL HYSTERECTOMY     BREAST LUMPECTOMY WITH RADIOFREQUENCY TAG  IDENTIFICATION Left 2/76/1470   Procedure: BREAST LUMPECTOMY WITH RADIOFREQUENCY TAG IDENTIFICATION;  Surgeon: Holly Cagey, MD;  Location: [facility];  Service: General;  Laterality: Left;   CERVICAL SPINE SURGERY     INNER EAR SURGERY     LUMBAR FUSION  July 09, 2010   L3-L7   PARTIAL MASTECTOMY WITH AXILLARY SENTINEL LYMPH NODE BIOPSY Left 05/31/2021   Procedure: PARTIAL MASTECTOMY WITH AXILLARY SENTINEL LYMPH NODE BIOPSY;  Surgeon: Holly Cagey, MD;  Location: [facility];  Service: General;  Laterality: Left;   PORTACATH PLACEMENT Right 07/30/2021   Procedure: INSERTION PORT-A-CATH;  Surgeon: Holly Cagey, MD;  Location: [facility];  Service: General;  Laterality: Right;    SOCIAL HISTORY:  Social History   Socioeconomic History   Marital status: Widowed    Spouse name: Not on file   Number of children: 1   Years of education: 64   Highest education level: 11th grade  Occupational History   Not on file  Tobacco Use   Smoking status: Former    Packs/day: 0.50    Types: Cigarettes    Quit date: 08/17/2021    Years since quitting: 0.1   Smokeless tobacco: Never   Tobacco comments:    Used to smoke a pack a day, Not smoking cigarettes but is vaping  09/12/2021  Vaping Use   Vaping Use: Every day   Substances: Flavoring  Substance and Sexual Activity   Alcohol use: Not Currently   Drug use: Not Currently    Types: Other-see comments    Comment: CBD and medical marijuana- denies 8/28/2,04/22/21   Sexual activity: Not Currently  Other Topics Concern   Not on file  Social History Narrative   04/22/21 Lives alone   From [place], moved to [place] in 07/10/2019   Husband passed away in Jul 09, 2016   Social Determinants of Health   Financial Resource Strain: Not on file  Food Insecurity: Not on file  Transportation Needs: Not on file  Physical Activity: Not on file  Stress: Not on file  Social Connections: Not on file  Intimate Partner Violence: Not on file    FAMILY HISTORY:   Family History  Problem Relation Age of Onset   Depression Mother    Diabetes Mother    Hypertension Mother    Hyperlipidemia Mother    Heart attack Mother    Osteoarthritis Father    Asthma Father    COPD Father    Breast cancer Sister        dx [id number]-[id number]   Lupus Sister    Osteoarthritis Sister    Asthma Sister    COPD Sister    Diabetes Sister    Drug abuse Sister    Heart attack Sister    Heart attack Maternal Grandmother    Colon cancer Neg Hx    Esophageal cancer Neg Hx     CURRENT MEDICATIONS:  Current Outpatient Medications  Medication Sig Dispense Refill   albuterol (PROVENTIL) (2.5 MG/3ML) 0.083% nebulizer solution Take 3 mLs (2.5 mg total) by nebulization every 6 (six) hours as needed for wheezing or shortness of breath. 150 mL 1   albuterol (VENTOLIN HFA) 108 (90  Base) MCG/ACT inhaler INHALE 1 TO 2 PUFFS INTO THE LUNGS EVERY 6 HOURS AS NEEDED FOR WHEEZING OR SHORTNESS OF BREATH (Patient taking differently: 1-2 puffs every 6 (six) hours as needed for wheezing or shortness of breath.) 18 g 5   atorvastatin (LIPITOR) 20 MG tablet Take 1 tablet (20 mg total) by mouth daily. 90 tablet 3   budesonide-formoterol (SYMBICORT) 160-4.5 MCG/ACT inhaler Inhale 2 puffs into the lungs 2 (two) times daily. 10.2 g 11   celecoxib (CELEBREX) 100 MG capsule TAKE 1 CAPSULE(100 MG) BY MOUTH TWICE DAILY (Patient taking differently: Take 100 mg by mouth daily.) 180 capsule 1   cyclobenzaprine (FLEXERIL) 10 MG tablet Take 1 tablet (10 mg total) by mouth 3 (three) times daily. 90 tablet 1   CYCLOPHOSPHAMIDE IV Inject into the vein every 21 ( twenty-one) days. X 4 cycles     diclofenac Sodium (VOLTAREN) 1 % GEL SMARTSIG:Gram(s) Topical Twice Daily     DOCETAXEL IV Inject into the vein every 21 ( twenty-one) days. X 4 cycles     escitalopram (LEXAPRO) 20 MG tablet TAKE 1 TABLET(20 MG) BY MOUTH DAILY (Patient taking differently: Take 20 mg by mouth daily.) 90 tablet 1   furosemide (LASIX) 20 MG  tablet Take 20 mg by mouth daily as needed for fluid.     gabapentin (NEURONTIN) 600 MG tablet Take 1 tablet in AM, 1 tablet at noon, and 2 tablets at bedtime. (Patient taking differently: Take 600-1,200 mg by mouth See admin instructions. Take 1 tablet by mouth in the morning, 1 tablet at noon and 2 tablets at bedtime) 360 tablet 1   lidocaine (LIDODERM) 5 % Place 3 patches onto the skin daily as needed (pain).     loperamide (IMODIUM) 2 MG capsule TAKE 1 CAPSULE BY MOUTH DAILY AS NEEDED FOR DIARRHEA OR LOOSE STOOLS. (Patient taking differently: Take 2 mg by mouth daily as needed for diarrhea or loose stools.) 90 capsule 0   magnesium oxide (MAG-OX) 400 (240 Mg) MG tablet Take 1 tablet (400 mg total) by mouth in the morning, at noon, and at bedtime. 90 tablet 2   montelukast (SINGULAIR) 10 MG tablet TAKE 1 TABLET(10 MG) BY MOUTH AT BEDTIME (Patient taking differently: Take 10 mg by mouth at bedtime.) 90 tablet 1   naloxone (NARCAN) nasal spray 4 mg/0.1 mL      nicotine (NICODERM CQ - DOSED IN MG/24 HOURS) 21 mg/24hr patch APPLY 1 PATCH(21 MG) TOPICALLY TO THE SKIN DAILY 28 patch 3   omeprazole (PRILOSEC) 40 MG capsule Take 1 capsule (40 mg total) by mouth 2 (two) times daily. 90 capsule 1   oxyCODONE-acetaminophen (PERCOCET) 10-325 MG tablet Take 1 tablet by mouth 6 (six) times daily.     SPIRIVA RESPIMAT 2.5 MCG/ACT AERS INHALE 2 PUFFS INTO THE LUNGS DAILY 4 g 3   tiZANidine (ZANAFLEX) 4 MG tablet Take 1 tablet (4 mg total) by mouth 3 (three) times daily. 270 tablet 1   traZODone (DESYREL) 100 MG tablet TAKE 2 TABLETS(200 MG) BY MOUTH AT BEDTIME (Patient taking differently: Take 200 mg by mouth at bedtime.) 180 tablet 1   lidocaine-prilocaine (EMLA) cream Apply a small amount to port a cath site (do not rub in) and cover with plastic wrap 1 hour prior to chemotherapy appointments (Patient not taking: Reported on 10/07/2021) 30 g 3   ondansetron (ZOFRAN) 4 MG tablet Take 1 tablet (4 mg total) by mouth  every 8 (eight) hours as needed. (Patient not taking: Reported  on 10/07/2021) 30 tablet 1   prochlorperazine (COMPAZINE) 10 MG tablet Take 1 tablet (10 mg total) by mouth every 6 (six) hours as needed (Nausea or vomiting). (Patient not taking: Reported on 10/07/2021) 30 tablet 1   No current facility-administered medications for this visit.    ALLERGIES:  Allergies  Allergen Reactions   Mucinex [Guaifenesin Er] Other (See Comments)    Jerky movements    PHYSICAL EXAM:  Performance status (ECOG): 1 - Symptomatic but completely ambulatory  There were no vitals filed for this visit. Wt Readings from Last 3 Encounters:  10/07/21 155 lb (70.3 kg)  09/16/21 156 lb 1.6 oz (70.8 kg)  09/12/21 151 lb 6.4 oz (68.7 kg)   Physical Exam Vitals reviewed.  Constitutional:      Appearance: Normal appearance.  Cardiovascular:     Rate and Rhythm: Normal rate and regular rhythm.     Pulses: Normal pulses.     Heart sounds: Normal heart sounds.  Pulmonary:     Effort: Pulmonary effort is normal.     Breath sounds: Normal breath sounds.  Neurological:     General: No focal deficit present.     Mental Status: She is alert and oriented to person, place, and time.  Psychiatric:        Mood and Affect: Mood normal.        Behavior: Behavior normal.    LABORATORY DATA:  I have reviewed the labs as listed.     Latest Ref Rng & Units 10/07/2021    8:39 AM 09/16/2021    8:42 AM 09/02/2021    5:35 AM  CBC  WBC 4.0 - 10.5 K/uL 7.7   12.0   7.0    Hemoglobin 12.0 - 15.0 g/dL 9.3   9.5   9.6    Hematocrit 36.0 - 46.0 % 30.8   30.6   31.4    Platelets 150 - 400 K/uL 309   371   144        Latest Ref Rng & Units 10/07/2021    8:39 AM 09/16/2021    8:42 AM 09/02/2021    5:35 AM  CMP  Glucose 70 - 99 mg/dL 173   184   94    BUN 8 - 23 mg/dL 8   8   <5    Creatinine 0.44 - 1.00 mg/dL 0.61   0.58   0.59    Sodium 135 - 145 mmol/L 141   140   140    Potassium 3.5 - 5.1 mmol/L 3.3   3.6   3.7     Chloride 98 - 111 mmol/L 105   102   98    CO2 22 - 32 mmol/L 31   32   34    Calcium 8.9 - 10.3 mg/dL 8.4   8.5   9.1    Total Protein 6.5 - 8.1 g/dL 5.7   5.9     Total Bilirubin 0.3 - 1.2 mg/dL 0.5   0.3     Alkaline Phos 38 - 126 U/L 68   88     AST 15 - 41 U/L 15   20     ALT 0 - 44 U/L 11   16       DIAGNOSTIC IMAGING:  I have independently reviewed the scans and discussed with the patient. DG Chest 2 View  Result Date: 09/12/2021 CLINICAL DATA:  Pneumonia, COPD EXAM: CHEST - 2 VIEW COMPARISON:  08/30/2021 FINDINGS: Focal pulmonary  infiltrate within the a right upper lobe has resolved. Mild left basilar atelectasis or scarring. The lungs are otherwise clear. No pneumothorax or pleural effusion. Right internal jugular chest port tip seen within the superior vena cava. Cardiac size within normal limits. No acute bone abnormality. IMPRESSION: Resolved right upper lobe pneumonic infiltrate. Electronically Signed   By: Fidela Salisbury M.D.   On: 09/12/2021 21:38     ASSESSMENT:  Stage I (T1BN0) left breast IDC, ER weakly positive: - She has been on estrogen supplements started at age 57 after TAH and BSO for endometriosis.  She continued estrogen into her 2s. - Left breast biopsy on 04/29/2021, IDC, Ki-67 30%, grade 3, ER-5% positive, PR negative, HER2 2+ by IHC and negative by FISH - Left lumpectomy and SLNB on 05/31/2021, 0/5 lymph nodes involved, 8 mm invasive ductal carcinoma, grade 3, margins negative.  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 - Oncotype DX recurrence score 60.  Distant recurrence rate at 9 years more than 39%.  Average absolute chemotherapy benefit more than 15%. - Adjuvant chemotherapy with 4 cycles of TC followed by XRT plus AI. - Dose attenuated TC cycle 1 on 08/05/2021    Social/family history: - She is a retired Regulatory affairs officer. - Current active smoker, 1 pack/day for the last 43 years. - No family history of  malignancies.   PLAN:  Stage I (T1b N0 M0) left breast cancer: - She has tolerated cycle 3 with 20% dose reduction reasonably well. - Denies any GI symptoms. - Had skin peeling in the creases of toes and dorsum of the fingers.  No numbness or tingling reported. - Reviewed labs today which showed normal LFTs and grossly normal CBC.  Hemoglobin is 9.3 with ferritin 75 and percent saturation 17.  U44 and folic acid was normal. - Proceed with last cycle today with 20% dose reduction. - She will be referred to radiation therapy. - RTC 4 weeks for follow-up.  We will start antiestrogen therapy at that time.  2.  Osteoporosis: - DEXA scan on 07/08/2021: T score -2.6. - I will recommend Prolia injections after chemotherapy completed.  3.  Hypomagnesemia: - She is taking magnesium once daily.  Magnesium is severely low at 1.2.  We will give magnesium 4 g IV.  Will increase on magnesium 3 times daily.    Orders placed this encounter:  No orders of the defined types were placed in this encounter.    Derek Jack, MD [place] [phone]   I, Thana Ates, am acting as a scribe for Dr. Derek Jack.  I, Derek Jack MD, have reviewed the above documentation for accuracy and completeness, and I agree with the above.

## 2021-10-07 NOTE — Progress Notes (Signed)
Pt presents today for Docetaxel and Cytoxan per provider's order. Vital signs and other labs WNL for treatment. Blood drawn for right AC, port did not give blood this morning. Pt was Alteplased at 0926 , pt was re-checked at 0956 and blood return noted.  Pt's potassium is 3.3 today and magnesium is 1.2 today. Message sent from [facility], RN to give pt 17mq potassium chloride p.o x1 dose and 4g IV magnesium sulfate. Okay to proceed with treatment today per Dr.K.  Docetaxel, Cytoxan, 481m potassium chloride p.o x 1 dose  and 4g IV magnesium sulfate given today per MD orders. Tolerated infusion without adverse affects. Vital signs stable. No complaints at this time. Discharged from clinic ambulatory in stable condition. Alert and oriented x 3. [facility] scheduled.

## 2021-10-07 NOTE — Telephone Encounter (Signed)
Called patient to schedule a consultation w. Dr. Moody. No answer, LVM for a return call.  

## 2021-10-07 NOTE — Progress Notes (Signed)
Patient has been assessed, vital signs and labs have been reviewed by Dr. Delton Coombes. ANC, Creatinine, LFTs, and Platelets are within treatment parameters per Dr. Delton Coombes.The patient is good to proceed with treatment at this time. Patient will receive 40 mEq Potassium po and 4g IV Mag while in clinic today. Primary RN and pharmacy aware.

## 2021-10-07 NOTE — Patient Instructions (Addendum)
[place] at [facility] Discharge Instructions  You were seen and examined today by Dr. Delton Coombes.  Dr. Delton Coombes has recommended proceeding with your 4th (and final) treatment today! Your potassium is slightly low and your Magnesium is quite low, you will receive additional doses here in the clinic today.  Ensure that you are taking Magnesium three times daily.  You will be referred to Radiation Oncology to discuss radiation therapy now that you will be done with chemotherapy. [facility] will contact you to arrange an appt with the Radiation Oncologist.  You were noted to have osteoporosis (weakening of the bones). Dr. Delton Coombes would like for you to receive injections to protect your bones (either Prolia or Zometa). He will discuss this with you further at a later visit.  You will also be on Anastrozole for at least 5 years. This is a pill taken once daily. Dr. Delton Coombes will discuss this with you further at your next visit also.  Follow-up as scheduled.    Thank you for choosing [place] at [facility] to provide your oncology and hematology care.  To afford each patient quality time with our provider, please arrive at least 15 minutes before your scheduled appointment time.   If you have a lab appointment with the [facility] [place] please come in thru the Main Entrance and check in at the main information desk.  You need to re-schedule your appointment should you arrive 10 or more minutes late.  We strive to give you quality time with our providers, and arriving late affects you and other patients whose appointments are after yours.  Also, if you no show three or more times for appointments you may be dismissed from the clinic at the providers discretion.     Again, thank you for choosing [facility].  Our hope is that these requests will decrease the amount of time that you wait before being seen by our  physicians.       _____________________________________________________________  Should you have questions after your visit to [facility], please contact our office at [phone] and follow the prompts.  Our office hours are 8:00 a.m. and 4:30 p.m. Monday - Friday.  Please note that voicemails left after 4:00 p.m. may not be returned until the following business day.  We are closed weekends and major holidays.  You do have access to a nurse 24-7, just call the main number to the clinic [phone] and do not press any options, hold on the line and a nurse will answer the phone.    For prescription refill requests, have your pharmacy contact our office and allow 72 hours.    Due to Covid, you will need to wear a mask upon entering the hospital. If you do not have a mask, a mask will be given to you at the Main Entrance upon arrival. For doctor visits, patients may have 1 support person age 76 or older with them. For treatment visits, patients can not have anyone with them due to social distancing guidelines and our immunocompromised population.

## 2021-10-07 NOTE — Patient Instructions (Signed)
[place]  Discharge Instructions: Thank you for choosing [facility] to provide your oncology and hematology care.  If you have a lab appointment with the [facility], please come in thru the Main Entrance and check in at the main information desk.  Wear comfortable clothing and clothing appropriate for easy access to any Portacath or PICC line.   We strive to give you quality time with your provider. You may need to reschedule your appointment if you arrive late (15 or more minutes).  Arriving late affects you and other patients whose appointments are after yours.  Also, if you miss three or more appointments without notifying the office, you may be dismissed from the clinic at the provider's discretion.      For prescription refill requests, have your pharmacy contact our office and allow 72 hours for refills to be completed.    Today you received the following chemotherapy and/or immunotherapy agents Docetaxel and Cytoxan   To help prevent nausea and vomiting after your treatment, we encourage you to take your nausea medication as directed.  BELOW ARE SYMPTOMS THAT SHOULD BE REPORTED IMMEDIATELY: *FEVER GREATER THAN 100.4 F (38 C) OR HIGHER *CHILLS OR SWEATING *NAUSEA AND VOMITING THAT IS NOT CONTROLLED WITH YOUR NAUSEA MEDICATION *UNUSUAL SHORTNESS OF BREATH *UNUSUAL BRUISING OR BLEEDING *URINARY PROBLEMS (pain or burning when urinating, or frequent urination) *BOWEL PROBLEMS (unusual diarrhea, constipation, pain near the anus) TENDERNESS IN MOUTH AND THROAT WITH OR WITHOUT PRESENCE OF ULCERS (sore throat, sores in mouth, or a toothache) UNUSUAL RASH, SWELLING OR PAIN  UNUSUAL VAGINAL DISCHARGE OR ITCHING   Items with * indicate a potential emergency and should be followed up as soon as possible or go to the Emergency Department if any problems should occur.  Please show the CHEMOTHERAPY ALERT CARD or IMMUNOTHERAPY ALERT CARD at check-in to the  Emergency Department and triage nurse.  Should you have questions after your visit or need to cancel or reschedule your appointment, please contact [facility] [phone]  and follow the prompts.  Office hours are 8:00 a.m. to 4:30 p.m. Monday - Friday. Please note that voicemails left after 4:00 p.m. may not be returned until the following business day.  We are closed weekends and major holidays. You have access to a nurse at all times for urgent questions. Please call the main number to the clinic [phone] and follow the prompts.  For any non-urgent questions, you may also contact your provider using MyChart. We now offer e-Visits for anyone 77 and older to request care online for non-urgent symptoms. For details visit [email or web address].   Also download the MyChart app! Go to the app store, search "MyChart", open the app, select [facility], and log in with your MyChart username and password.  Due to Covid, a mask is required upon entering the hospital/clinic. If you do not have a mask, one will be given to you upon arrival. For doctor visits, patients may have 1 support person aged 82 or older with them. For treatment visits, patients cannot have anyone with them due to current Covid guidelines and our immunocompromised population.

## 2021-10-08 ENCOUNTER — Telehealth: Payer: Self-pay | Admitting: Radiation Oncology

## 2021-10-08 NOTE — Telephone Encounter (Signed)
Called patient to schedule a consultation w. Dr. Moody. No answer, LVM for a return call.  

## 2021-10-09 ENCOUNTER — Inpatient Hospital Stay (HOSPITAL_COMMUNITY): Payer: Medicare Other

## 2021-10-09 ENCOUNTER — Ambulatory Visit (HOSPITAL_COMMUNITY): Payer: Medicare Other

## 2021-10-09 VITALS — BP 116/57 | HR 96 | Temp 99.0°F | Resp 20

## 2021-10-09 DIAGNOSIS — Z5111 Encounter for antineoplastic chemotherapy: Secondary | ICD-10-CM | POA: Diagnosis not present

## 2021-10-09 DIAGNOSIS — Z170 Estrogen receptor positive status [ER+]: Secondary | ICD-10-CM

## 2021-10-09 DIAGNOSIS — Z95828 Presence of other vascular implants and grafts: Secondary | ICD-10-CM

## 2021-10-09 MED ORDER — PEGFILGRASTIM-BMEZ 6 MG/0.6ML ~~LOC~~ SOSY
6.0000 mg | PREFILLED_SYRINGE | Freq: Once | SUBCUTANEOUS | Status: AC
  Administered 2021-10-09: 6 mg via SUBCUTANEOUS
  Filled 2021-10-09: qty 0.6

## 2021-10-09 NOTE — Progress Notes (Signed)
Holly Phillips presents today for injection per the provider's orders.  Ziextenzo injection administration without incident; injection site WNL; see MAR for injection details.  Patient tolerated procedure well and without incident.  No questions or complaints noted at this time.   Discharged from clinic ambulatory in stable condition. Alert and oriented x 3. F/U with [facility] as scheduled.

## 2021-10-09 NOTE — Patient Instructions (Signed)
[facility]  Discharge Instructions: Thank you for choosing [facility] to provide your oncology and hematology care.  If you have a lab appointment with the Cancer Center, please come in thru the Main Entrance and check in at the main information desk.  Wear comfortable clothing and clothing appropriate for easy access to any Portacath or PICC line.   We strive to give you quality time with your provider. You may need to reschedule your appointment if you arrive late (15 or more minutes).  Arriving late affects you and other patients whose appointments are after yours.  Also, if you miss three or more appointments without notifying the office, you may be dismissed from the clinic at the provider's discretion.      For prescription refill requests, have your pharmacy contact our office and allow 72 hours for refills to be completed.    Today you received Ziextenzo injection.      BELOW ARE SYMPTOMS THAT SHOULD BE REPORTED IMMEDIATELY: *FEVER GREATER THAN 100.4 F (38 C) OR HIGHER *CHILLS OR SWEATING *NAUSEA AND VOMITING THAT IS NOT CONTROLLED WITH YOUR NAUSEA MEDICATION *UNUSUAL SHORTNESS OF BREATH *UNUSUAL BRUISING OR BLEEDING *URINARY PROBLEMS (pain or burning when urinating, or frequent urination) *BOWEL PROBLEMS (unusual diarrhea, constipation, pain near the anus) TENDERNESS IN MOUTH AND THROAT WITH OR WITHOUT PRESENCE OF ULCERS (sore throat, sores in mouth, or a toothache) UNUSUAL RASH, SWELLING OR PAIN  UNUSUAL VAGINAL DISCHARGE OR ITCHING   Items with * indicate a potential emergency and should be followed up as soon as possible or go to the Emergency Department if any problems should occur.  Please show the CHEMOTHERAPY ALERT CARD or IMMUNOTHERAPY ALERT CARD at check-in to the Emergency Department and triage nurse.  Should you have questions after your visit or need to cancel or reschedule your appointment, please contact [facility]  [phone]  and follow the prompts.  Office hours are 8:00 a.m. to 4:30 p.m. Monday - Friday. Please note that voicemails left after 4:00 p.m. may not be returned until the following business day.  We are closed weekends and major holidays. You have access to a nurse at all times for urgent questions. Please call the main number to the clinic [phone] and follow the prompts.  For any non-urgent questions, you may also contact your provider using MyChart. We now offer e-Visits for anyone 18 and older to request care online for non-urgent symptoms. For details visit [email or web address].   Also download the MyChart app! Go to the app store, search "MyChart", open the app, select [place], and log in with your MyChart username and password.  Due to Covid, a mask is required upon entering the hospital/clinic. If you do not have a mask, one will be given to you upon arrival. For doctor visits, patients may have 1 support person aged 18 or older with them. For treatment visits, patients cannot have anyone with them due to current Covid guidelines and our immunocompromised population.  

## 2021-10-11 NOTE — Progress Notes (Signed)
New Breast Cancer Diagnosis: Left Breast UOQ  Did patient present with symptoms (if so, please note symptoms) or screening mammography?:Screening Mass    Location and Extent of disease :left breast. Located at 1 o'clock position, measured 8 mm in greatest dimension. Adenopathy no.  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 Constance Haw -Partial Mastectomy with axillary sentinel lymph node biopsy 05/31/2021  Medical oncologist, treatment if any:   Dr. Delton Coombes 10/07/2021 - Oncotype DX recurrence score 60.  Distant recurrence rate at 9 years more than 39%.  Average absolute chemotherapy benefit more than 15%. - Adjuvant chemotherapy with 4 cycles of TC followed by XRT plus AI. - Dose attenuated TC cycle 1 on 08/05/2021 - She has tolerated cycle 3 with 20% dose reduction reasonably well. - Proceed with last cycle today with 20% dose reduction. - She will be referred to radiation therapy. - RTC 4 weeks for follow-up.  We will start antiestrogen therapy at that time.   Family History of Breast/Ovarian/Prostate Cancer: Sister had breast cancer.  Lymphedema issues, if any: No     Pain issues, if any:  No  SAFETY ISSUES: Prior radiation? No Pacemaker/ICD? No Possible current pregnancy? Hysterectomy Is the patient on methotrexate? No  Current Complaints / other details:   Genetics: Negative testing.  No pathogenic variants identified on the [facility] Multi-Cancer+RNA panel. 07/25/2021  Port Insertion 07/30/2021  Wears Oxygen at home 3 liters

## 2021-10-15 ENCOUNTER — Ambulatory Visit
Admission: RE | Admit: 2021-10-15 | Discharge: 2021-10-15 | Disposition: A | Discharge status: Home / Self Care | Payer: Medicare Other | Source: Ambulatory Visit | Attending: Radiation Oncology | Admitting: Radiation Oncology

## 2021-10-15 ENCOUNTER — Other Ambulatory Visit: Payer: Self-pay

## 2021-10-15 ENCOUNTER — Encounter: Payer: Self-pay | Admitting: Radiation Oncology

## 2021-10-15 VITALS — BP 137/69 | HR 102 | Temp 97.7°F | Resp 20 | Ht 60.0 in | Wt 142.8 lb

## 2021-10-15 DIAGNOSIS — G4730 Sleep apnea, unspecified: Secondary | ICD-10-CM | POA: Diagnosis not present

## 2021-10-15 DIAGNOSIS — Z171 Estrogen receptor negative status [ER-]: Secondary | ICD-10-CM | POA: Diagnosis not present

## 2021-10-15 DIAGNOSIS — Z79899 Other long term (current) drug therapy: Secondary | ICD-10-CM | POA: Insufficient documentation

## 2021-10-15 DIAGNOSIS — C50412 Malignant neoplasm of upper-outer quadrant of left female breast: Secondary | ICD-10-CM | POA: Insufficient documentation

## 2021-10-15 DIAGNOSIS — Z86010 Personal history of colonic polyps: Secondary | ICD-10-CM | POA: Diagnosis not present

## 2021-10-15 DIAGNOSIS — J449 Chronic obstructive pulmonary disease, unspecified: Secondary | ICD-10-CM | POA: Insufficient documentation

## 2021-10-15 DIAGNOSIS — K219 Gastro-esophageal reflux disease without esophagitis: Secondary | ICD-10-CM | POA: Insufficient documentation

## 2021-10-15 DIAGNOSIS — Z9221 Personal history of antineoplastic chemotherapy: Secondary | ICD-10-CM | POA: Diagnosis not present

## 2021-10-15 DIAGNOSIS — Z8673 Personal history of transient ischemic attack (TIA), and cerebral infarction without residual deficits: Secondary | ICD-10-CM | POA: Diagnosis not present

## 2021-10-15 DIAGNOSIS — Z87891 Personal history of nicotine dependence: Secondary | ICD-10-CM | POA: Insufficient documentation

## 2021-10-15 DIAGNOSIS — Z803 Family history of malignant neoplasm of breast: Secondary | ICD-10-CM | POA: Diagnosis not present

## 2021-10-15 DIAGNOSIS — J439 Emphysema, unspecified: Secondary | ICD-10-CM | POA: Insufficient documentation

## 2021-10-15 NOTE — Progress Notes (Signed)
Radiation Oncology         ([phone]) [phone] ________________________________  Name: Holly Phillips        MRN: [id number]  Date of Service: 10/15/2021 DOB: [birth year]  CC:Holly Phillips, [place]  Derek Jack, MD     REFERRING PHYSICIAN: Derek Jack, MD   DIAGNOSIS: The encounter diagnosis was Malignant neoplasm of upper-outer quadrant of left breast in female, estrogen receptor positive ([place]).   HISTORY OF PRESENT ILLNESS: Holly Phillips is a 66 y.o. female seen at the request of Dr. Delton Coombes for a newly diagnosed left breast cancer. The patient was found to have a  mass on screening mammogram in October 2022. She had findings in both breasts and diagnostic follow up in December 2022 showed resolution of the right breast finding, but persistent change in the left. By ultrasound this measured 8 mm in the 1:00 position of the left breast, and no adenopathy was noted. A biopsy on 04/29/21 showed a grade 3 invasive ductal carcinoma that was ER weakly positive, PR negative, and HER2 negative with a Ki 67 of 30%. She underwent left lumpectomy and sentinel node biopsy on 05/31/21 that showed a grade 3 invasive ductal carcinoma that was 8 mm in greatest dimension, and also associated high grade DCIS also measuring 8 mm. Her margins were clear for invasive and noninvasive diseae, and 5 nodes were sampled, all of which were negative for carcinoma. She began systemic chemotherapy for her functionally triple negative cancer on 08/05/21 and finished 4 cycles on 10/07/21. She's seen today to discuss adjuvant radiotherapy.     PREVIOUS RADIATION THERAPY: No   PAST MEDICAL HISTORY:  Past Medical History:  Diagnosis Date   Anxiety    Arthritis    Asthma    Chronic back pain    Colon polyps    COPD (chronic obstructive pulmonary disease) (HCC)    Depression    Emphysema of lung (HCC)    Family history of breast cancer    GERD (gastroesophageal reflux disease)    Memory change     Port-A-Cath in place 08/01/2021   Sleep apnea    TIA (transient ischemic attack)    Tremor        PAST SURGICAL HISTORY: Past Surgical History:  Procedure Laterality Date   ABDOMINAL HYSTERECTOMY     BREAST LUMPECTOMY WITH RADIOFREQUENCY TAG IDENTIFICATION Left 11/02/735   Procedure: BREAST LUMPECTOMY WITH RADIOFREQUENCY TAG IDENTIFICATION;  Surgeon: Virl Cagey, MD;  Location: [facility];  Service: General;  Laterality: Left;   CERVICAL SPINE SURGERY     INNER EAR SURGERY     LUMBAR FUSION  2012   L3-L7   PARTIAL MASTECTOMY WITH AXILLARY SENTINEL LYMPH NODE BIOPSY Left 05/31/2021   Procedure: PARTIAL MASTECTOMY WITH AXILLARY SENTINEL LYMPH NODE BIOPSY;  Surgeon: Virl Cagey, MD;  Location: [facility];  Service: General;  Laterality: Left;   PORTACATH PLACEMENT Right 07/30/2021   Procedure: INSERTION PORT-A-CATH;  Surgeon: Virl Cagey, MD;  Location: [facility];  Service: General;  Laterality: Right;     FAMILY HISTORY:  Family History  Problem Relation Age of Onset   Depression Mother    Diabetes Mother    Hypertension Mother    Hyperlipidemia Mother    Heart attack Mother    Osteoarthritis Father    Asthma Father    COPD Father    Breast cancer Sister        dx 14s-60s   Lupus Sister    Osteoarthritis Sister  Asthma Sister    COPD Sister    Diabetes Sister    Drug abuse Sister    Heart attack Sister    Heart attack Maternal Grandmother    Colon cancer Neg Hx    Esophageal cancer Neg Hx      SOCIAL HISTORY:  reports that she quit smoking about 8 weeks ago. Her smoking use included cigarettes. She smoked an average of .5 packs per day. She has never used smokeless tobacco. She reports that she does not currently use alcohol. She reports that she does not currently use drugs after having used the following drugs: Other-see comments. The patient is widowed and lives in [place].    ALLERGIES: Mucinex [guaifenesin er]   MEDICATIONS:  Current Outpatient  Medications  Medication Sig Dispense Refill   albuterol (PROVENTIL) (2.5 MG/3ML) 0.083% nebulizer solution Take 3 mLs (2.5 mg total) by nebulization every 6 (six) hours as needed for wheezing or shortness of breath. 150 mL 1   albuterol (VENTOLIN HFA) 108 (90 Base) MCG/ACT inhaler INHALE 1 TO 2 PUFFS INTO THE LUNGS EVERY 6 HOURS AS NEEDED FOR WHEEZING OR SHORTNESS OF BREATH (Patient taking differently: 1-2 puffs every 6 (six) hours as needed for wheezing or shortness of breath.) 18 g 5   atorvastatin (LIPITOR) 20 MG tablet Take 1 tablet (20 mg total) by mouth daily. 90 tablet 3   budesonide-formoterol (SYMBICORT) 160-4.5 MCG/ACT inhaler Inhale 2 puffs into the lungs 2 (two) times daily. 10.2 g 11   celecoxib (CELEBREX) 100 MG capsule TAKE 1 CAPSULE(100 MG) BY MOUTH TWICE DAILY (Patient taking differently: Take 100 mg by mouth daily.) 180 capsule 1   cyclobenzaprine (FLEXERIL) 10 MG tablet Take 1 tablet (10 mg total) by mouth 3 (three) times daily. 90 tablet 1   CYCLOPHOSPHAMIDE IV Inject into the vein every 21 ( twenty-one) days. X 4 cycles     diclofenac Sodium (VOLTAREN) 1 % GEL SMARTSIG:Gram(s) Topical Twice Daily     DOCETAXEL IV Inject into the vein every 21 ( twenty-one) days. X 4 cycles     escitalopram (LEXAPRO) 20 MG tablet TAKE 1 TABLET(20 MG) BY MOUTH DAILY (Patient taking differently: Take 20 mg by mouth daily.) 90 tablet 1   furosemide (LASIX) 20 MG tablet Take 20 mg by mouth daily as needed for fluid.     gabapentin (NEURONTIN) 600 MG tablet Take 1 tablet in AM, 1 tablet at noon, and 2 tablets at bedtime. (Patient taking differently: Take 600-1,200 mg by mouth See admin instructions. Take 1 tablet by mouth in the morning, 1 tablet at noon and 2 tablets at bedtime) 360 tablet 1   lidocaine (LIDODERM) 5 % Place 3 patches onto the skin daily as needed (pain).     lidocaine-prilocaine (EMLA) cream Apply a small amount to port a cath site (do not rub in) and cover with plastic wrap 1 hour  prior to chemotherapy appointments 30 g 3   loperamide (IMODIUM) 2 MG capsule TAKE 1 CAPSULE BY MOUTH DAILY AS NEEDED FOR DIARRHEA OR LOOSE STOOLS. (Patient taking differently: Take 2 mg by mouth daily as needed for diarrhea or loose stools.) 90 capsule 0   magnesium oxide (MAG-OX) 400 (240 Mg) MG tablet Take 1 tablet (400 mg total) by mouth in the morning, at noon, and at bedtime. 90 tablet 2   montelukast (SINGULAIR) 10 MG tablet TAKE 1 TABLET(10 MG) BY MOUTH AT BEDTIME (Patient taking differently: Take 10 mg by mouth at bedtime.) 90 tablet 1  naloxone (NARCAN) nasal spray 4 mg/0.1 mL      nicotine (NICODERM CQ - DOSED IN MG/24 HOURS) 21 mg/24hr patch APPLY 1 PATCH(21 MG) TOPICALLY TO THE SKIN DAILY 28 patch 3   omeprazole (PRILOSEC) 40 MG capsule Take 1 capsule (40 mg total) by mouth 2 (two) times daily. 90 capsule 1   ondansetron (ZOFRAN) 4 MG tablet Take 1 tablet (4 mg total) by mouth every 8 (eight) hours as needed. 30 tablet 1   oxyCODONE-acetaminophen (PERCOCET) 10-325 MG tablet Take 1 tablet by mouth 6 (six) times daily.     prochlorperazine (COMPAZINE) 10 MG tablet Take 1 tablet (10 mg total) by mouth every 6 (six) hours as needed (Nausea or vomiting). 30 tablet 1   SPIRIVA RESPIMAT 2.5 MCG/ACT AERS INHALE 2 PUFFS INTO THE LUNGS DAILY 4 g 3   tiZANidine (ZANAFLEX) 4 MG tablet Take 1 tablet (4 mg total) by mouth 3 (three) times daily. 270 tablet 1   traZODone (DESYREL) 100 MG tablet TAKE 2 TABLETS(200 MG) BY MOUTH AT BEDTIME (Patient taking differently: Take 200 mg by mouth at bedtime.) 180 tablet 1   No current facility-administered medications for this encounter.     REVIEW OF SYSTEMS: On review of systems, the patient reports that she is doing well overall. Her main complaint is skin related changes especially in her arms due to chemotherapy. No other complaints are noted.      PHYSICAL EXAM:  Wt Readings from Last 3 Encounters:  10/07/21 155 lb (70.3 kg)  09/16/21 156 lb 1.6 oz  (70.8 kg)  09/12/21 151 lb 6.4 oz (68.7 kg)   Temp Readings from Last 3 Encounters:  10/09/21 99 F (37.2 C) (Tympanic)  10/07/21 (!) 96.6 F (35.9 C) (Tympanic)  10/07/21 97.7 F (36.5 C) (Tympanic)   BP Readings from Last 3 Encounters:  10/09/21 (!) 116/57  10/07/21 112/79  10/07/21 (!) 133/52   Pulse Readings from Last 3 Encounters:  10/09/21 96  10/07/21 98  10/07/21 86    In general this is a well appearing caucasian female in no acute distress. She's alert and oriented x4 and appropriate throughout the examination. Cardiopulmonary assessment is negative for acute distress and she exhibits normal effort. Her left breast reveals a well healed incision as well as her axillary incision without erythema, separation, or drainage. She has excoriations over her forearms bilaterally.     ECOG = 1  0 - Asymptomatic (Fully active, able to carry on all predisease activities without restriction)  1 - Symptomatic but completely ambulatory (Restricted in physically strenuous activity but ambulatory and able to carry out work of a light or sedentary nature. For example, light housework, office work)  2 - Symptomatic, <50% in bed during the day (Ambulatory and capable of all self care but unable to carry out any work activities. Up and about more than 50% of waking hours)  3 - Symptomatic, >50% in bed, but not bedbound (Capable of only limited self-care, confined to bed or chair 50% or more of waking hours)  4 - Bedbound (Completely disabled. Cannot carry on any self-care. Totally confined to bed or chair)  5 - Death   Eustace Pen MM, Creech RH, Tormey DC, et al. [phone]). "Toxicity and response criteria of the [facility]". [place] Oncol. 5 (6): 649-55    LABORATORY DATA:  Lab Results  Component Value Date   WBC 7.7 10/07/2021   HGB 9.3 (L) 10/07/2021   HCT 30.8 (L) 10/07/2021  MCV 101.0 (H) 10/07/2021   PLT 309 10/07/2021   Lab Results  Component Value  Date   NA 141 10/07/2021   K 3.3 (L) 10/07/2021   CL 105 10/07/2021   CO2 31 10/07/2021   Lab Results  Component Value Date   ALT 11 10/07/2021   AST 15 10/07/2021   ALKPHOS 68 10/07/2021   BILITOT 0.5 10/07/2021      RADIOGRAPHY: No results found.     IMPRESSION/PLAN: 1. 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 Dr. Lisbeth Renshaw discusses the pathology findings and reviews the nature of left breast disease. She has done well since surgery and is continuing to recover from chemotherapy side effects. Dr. Lisbeth Renshaw discusses the rationale for external radiotherapy to the breast  to reduce risks of local recurrence followed by antiestrogen therapy. We discussed the risks, benefits, short, and long term effects of radiotherapy, as well as the curative intent, and the patient is interested in proceeding. Dr. Lisbeth Renshaw discusses the delivery and logistics of radiotherapy and anticipates a course of 4 weeks of radiotherapy. Written consent is obtained and placed in the chart, a copy was provided to the patient. The patient will be contacted to coordinate treatment planning by our simulation department.     In a visit lasting 60 minutes, greater than 50% of the time was spent face to face reviewing her case, as well as in preparation of, discussing, and coordinating the patient's care.  The above documentation reflects my direct findings during this shared patient visit. Please see the separate note by Dr. Lisbeth Renshaw on this date for the remainder of the patient's plan of care.    Carola Rhine, [facility]    **Disclaimer: This note was dictated with voice recognition software. Similar sounding words can inadvertently be transcribed and this note may contain transcription errors which may not have been corrected upon publication of note.**

## 2021-10-26 ENCOUNTER — Other Ambulatory Visit: Payer: Self-pay | Admitting: Nurse Practitioner

## 2021-10-28 ENCOUNTER — Other Ambulatory Visit: Payer: Self-pay

## 2021-10-28 ENCOUNTER — Other Ambulatory Visit: Payer: Self-pay | Admitting: *Deleted

## 2021-10-28 ENCOUNTER — Ambulatory Visit
Admission: RE | Admit: 2021-10-28 | Discharge: 2021-10-28 | Disposition: A | Discharge status: Home / Self Care | Payer: Medicare Other | Source: Ambulatory Visit | Attending: Radiation Oncology | Admitting: Radiation Oncology

## 2021-10-28 DIAGNOSIS — C50412 Malignant neoplasm of upper-outer quadrant of left female breast: Secondary | ICD-10-CM | POA: Diagnosis present

## 2021-10-28 DIAGNOSIS — Z510 Encounter for antineoplastic radiation therapy: Secondary | ICD-10-CM | POA: Insufficient documentation

## 2021-10-28 DIAGNOSIS — Z170 Estrogen receptor positive status [ER+]: Secondary | ICD-10-CM | POA: Insufficient documentation

## 2021-10-28 MED ORDER — TIZANIDINE HCL 4 MG PO TABS: 4.0000 mg | ORAL_TABLET | Freq: Three times a day (TID) | ORAL | 0 refills | Status: DC

## 2021-11-04 ENCOUNTER — Inpatient Hospital Stay (HOSPITAL_COMMUNITY): Payer: Medicare Other

## 2021-11-04 ENCOUNTER — Inpatient Hospital Stay (HOSPITAL_COMMUNITY): Payer: Medicare Other | Attending: Hematology | Admitting: Hematology

## 2021-11-05 ENCOUNTER — Ambulatory Visit: Payer: Medicare Other | Admitting: Radiation Oncology

## 2021-11-06 ENCOUNTER — Ambulatory Visit: Payer: Medicare Other

## 2021-11-06 DIAGNOSIS — Z510 Encounter for antineoplastic radiation therapy: Secondary | ICD-10-CM | POA: Diagnosis not present

## 2021-11-07 ENCOUNTER — Other Ambulatory Visit: Payer: Self-pay

## 2021-11-07 ENCOUNTER — Ambulatory Visit: Payer: Medicare Other

## 2021-11-07 ENCOUNTER — Ambulatory Visit
Admission: RE | Admit: 2021-11-07 | Discharge: 2021-11-07 | Disposition: A | Discharge status: Home / Self Care | Payer: Medicare Other | Source: Ambulatory Visit | Attending: Radiation Oncology | Admitting: Radiation Oncology

## 2021-11-07 DIAGNOSIS — Z510 Encounter for antineoplastic radiation therapy: Secondary | ICD-10-CM | POA: Diagnosis not present

## 2021-11-07 LAB — RAD ONC ARIA SESSION SUMMARY
Course Elapsed Days: 0
Plan Fractions Treated to Date: 1
Plan Prescribed Dose Per Fraction: 2.66 Gy
Plan Total Fractions Prescribed: 16
Plan Total Prescribed Dose: 42.56 Gy
Reference Point Dosage Given to Date: 2.66 Gy
Reference Point Session Dosage Given: 2.66 Gy
Session Number: 1

## 2021-11-08 ENCOUNTER — Ambulatory Visit
Admission: RE | Admit: 2021-11-08 | Discharge: 2021-11-08 | Disposition: A | Discharge status: Home / Self Care | Payer: Medicare Other | Source: Ambulatory Visit | Attending: Radiation Oncology | Admitting: Radiation Oncology

## 2021-11-08 ENCOUNTER — Other Ambulatory Visit: Payer: Self-pay

## 2021-11-08 DIAGNOSIS — Z170 Estrogen receptor positive status [ER+]: Secondary | ICD-10-CM

## 2021-11-08 DIAGNOSIS — Z510 Encounter for antineoplastic radiation therapy: Secondary | ICD-10-CM | POA: Diagnosis not present

## 2021-11-08 LAB — RAD ONC ARIA SESSION SUMMARY
Course Elapsed Days: 1
Plan Fractions Treated to Date: 2
Plan Prescribed Dose Per Fraction: 2.66 Gy
Plan Total Fractions Prescribed: 16
Plan Total Prescribed Dose: 42.56 Gy
Reference Point Dosage Given to Date: 5.32 Gy
Reference Point Session Dosage Given: 2.66 Gy
Session Number: 2

## 2021-11-11 ENCOUNTER — Ambulatory Visit
Admission: RE | Admit: 2021-11-11 | Discharge: 2021-11-11 | Disposition: A | Discharge status: Home / Self Care | Payer: Medicare Other | Source: Ambulatory Visit | Attending: Radiation Oncology | Admitting: Radiation Oncology

## 2021-11-11 ENCOUNTER — Other Ambulatory Visit: Payer: Self-pay

## 2021-11-11 DIAGNOSIS — Z510 Encounter for antineoplastic radiation therapy: Secondary | ICD-10-CM | POA: Diagnosis not present

## 2021-11-11 LAB — RAD ONC ARIA SESSION SUMMARY
Course Elapsed Days: 4
Plan Fractions Treated to Date: 3
Plan Prescribed Dose Per Fraction: 2.66 Gy
Plan Total Fractions Prescribed: 16
Plan Total Prescribed Dose: 42.56 Gy
Reference Point Dosage Given to Date: 7.98 Gy
Reference Point Session Dosage Given: 2.66 Gy
Session Number: 3

## 2021-11-12 ENCOUNTER — Ambulatory Visit
Admission: RE | Admit: 2021-11-12 | Discharge: 2021-11-12 | Disposition: A | Discharge status: Home / Self Care | Payer: Medicare Other | Source: Ambulatory Visit | Attending: Radiation Oncology | Admitting: Radiation Oncology

## 2021-11-12 ENCOUNTER — Ambulatory Visit: Payer: Medicare Other | Admitting: Gastroenterology

## 2021-11-12 ENCOUNTER — Other Ambulatory Visit: Payer: Self-pay

## 2021-11-12 DIAGNOSIS — Z510 Encounter for antineoplastic radiation therapy: Secondary | ICD-10-CM | POA: Diagnosis not present

## 2021-11-12 LAB — RAD ONC ARIA SESSION SUMMARY
Course Elapsed Days: 5
Plan Fractions Treated to Date: 4
Plan Prescribed Dose Per Fraction: 2.66 Gy
Plan Total Fractions Prescribed: 16
Plan Total Prescribed Dose: 42.56 Gy
Reference Point Dosage Given to Date: 10.64 Gy
Reference Point Session Dosage Given: 2.66 Gy
Session Number: 4

## 2021-11-13 ENCOUNTER — Other Ambulatory Visit: Payer: Self-pay

## 2021-11-13 ENCOUNTER — Ambulatory Visit
Admission: RE | Admit: 2021-11-13 | Discharge: 2021-11-13 | Disposition: A | Discharge status: Home / Self Care | Payer: Medicare Other | Source: Ambulatory Visit | Attending: Radiation Oncology | Admitting: Radiation Oncology

## 2021-11-13 DIAGNOSIS — Z510 Encounter for antineoplastic radiation therapy: Secondary | ICD-10-CM | POA: Diagnosis not present

## 2021-11-13 LAB — RAD ONC ARIA SESSION SUMMARY
Course Elapsed Days: 6
Plan Fractions Treated to Date: 5
Plan Prescribed Dose Per Fraction: 2.66 Gy
Plan Total Fractions Prescribed: 16
Plan Total Prescribed Dose: 42.56 Gy
Reference Point Dosage Given to Date: 13.3 Gy
Reference Point Session Dosage Given: 2.66 Gy
Session Number: 5

## 2021-11-14 ENCOUNTER — Ambulatory Visit
Admission: RE | Admit: 2021-11-14 | Discharge: 2021-11-14 | Disposition: A | Discharge status: Home / Self Care | Payer: Medicare Other | Source: Ambulatory Visit | Attending: Radiation Oncology | Admitting: Radiation Oncology

## 2021-11-14 ENCOUNTER — Other Ambulatory Visit: Payer: Self-pay

## 2021-11-14 DIAGNOSIS — Z510 Encounter for antineoplastic radiation therapy: Secondary | ICD-10-CM | POA: Diagnosis not present

## 2021-11-14 LAB — RAD ONC ARIA SESSION SUMMARY
Course Elapsed Days: 7
Plan Fractions Treated to Date: 6
Plan Prescribed Dose Per Fraction: 2.66 Gy
Plan Total Fractions Prescribed: 16
Plan Total Prescribed Dose: 42.56 Gy
Reference Point Dosage Given to Date: 15.96 Gy
Reference Point Session Dosage Given: 2.66 Gy
Session Number: 6

## 2021-11-15 ENCOUNTER — Ambulatory Visit
Admission: RE | Admit: 2021-11-15 | Discharge: 2021-11-15 | Disposition: A | Discharge status: Home / Self Care | Payer: Medicare Other | Source: Ambulatory Visit | Attending: Radiation Oncology | Admitting: Radiation Oncology

## 2021-11-15 ENCOUNTER — Other Ambulatory Visit: Payer: Self-pay

## 2021-11-15 DIAGNOSIS — Z510 Encounter for antineoplastic radiation therapy: Secondary | ICD-10-CM | POA: Diagnosis not present

## 2021-11-15 LAB — RAD ONC ARIA SESSION SUMMARY
Course Elapsed Days: 8
Plan Fractions Treated to Date: 7
Plan Prescribed Dose Per Fraction: 2.66 Gy
Plan Total Fractions Prescribed: 16
Plan Total Prescribed Dose: 42.56 Gy
Reference Point Dosage Given to Date: 18.62 Gy
Reference Point Session Dosage Given: 2.66 Gy
Session Number: 7

## 2021-11-18 ENCOUNTER — Other Ambulatory Visit: Payer: Self-pay

## 2021-11-18 ENCOUNTER — Ambulatory Visit
Admission: RE | Admit: 2021-11-18 | Discharge: 2021-11-18 | Disposition: A | Discharge status: Home / Self Care | Payer: Medicare Other | Source: Ambulatory Visit | Attending: Radiation Oncology | Admitting: Radiation Oncology

## 2021-11-18 DIAGNOSIS — Z510 Encounter for antineoplastic radiation therapy: Secondary | ICD-10-CM | POA: Insufficient documentation

## 2021-11-18 DIAGNOSIS — Z170 Estrogen receptor positive status [ER+]: Secondary | ICD-10-CM | POA: Insufficient documentation

## 2021-11-18 DIAGNOSIS — C50412 Malignant neoplasm of upper-outer quadrant of left female breast: Secondary | ICD-10-CM | POA: Diagnosis present

## 2021-11-18 LAB — RAD ONC ARIA SESSION SUMMARY
Course Elapsed Days: 11
Plan Fractions Treated to Date: 8
Plan Prescribed Dose Per Fraction: 2.66 Gy
Plan Total Fractions Prescribed: 16
Plan Total Prescribed Dose: 42.56 Gy
Reference Point Dosage Given to Date: 21.28 Gy
Reference Point Session Dosage Given: 2.66 Gy
Session Number: 8

## 2021-11-20 ENCOUNTER — Other Ambulatory Visit: Payer: Self-pay

## 2021-11-20 ENCOUNTER — Ambulatory Visit
Admission: RE | Admit: 2021-11-20 | Discharge: 2021-11-20 | Disposition: A | Discharge status: Home / Self Care | Payer: Medicare Other | Source: Ambulatory Visit | Attending: Radiation Oncology | Admitting: Radiation Oncology

## 2021-11-20 DIAGNOSIS — Z510 Encounter for antineoplastic radiation therapy: Secondary | ICD-10-CM | POA: Diagnosis not present

## 2021-11-20 LAB — RAD ONC ARIA SESSION SUMMARY
Course Elapsed Days: 13
Plan Fractions Treated to Date: 9
Plan Prescribed Dose Per Fraction: 2.66 Gy
Plan Total Fractions Prescribed: 16
Plan Total Prescribed Dose: 42.56 Gy
Reference Point Dosage Given to Date: 23.94 Gy
Reference Point Session Dosage Given: 2.66 Gy
Session Number: 9

## 2021-11-21 ENCOUNTER — Ambulatory Visit
Admission: RE | Admit: 2021-11-21 | Discharge: 2021-11-21 | Disposition: A | Discharge status: Home / Self Care | Payer: Medicare Other | Source: Ambulatory Visit | Attending: Radiation Oncology | Admitting: Radiation Oncology

## 2021-11-21 ENCOUNTER — Other Ambulatory Visit: Payer: Self-pay

## 2021-11-21 DIAGNOSIS — Z510 Encounter for antineoplastic radiation therapy: Secondary | ICD-10-CM | POA: Diagnosis not present

## 2021-11-21 LAB — RAD ONC ARIA SESSION SUMMARY
Course Elapsed Days: 14
Plan Fractions Treated to Date: 10
Plan Prescribed Dose Per Fraction: 2.66 Gy
Plan Total Fractions Prescribed: 16
Plan Total Prescribed Dose: 42.56 Gy
Reference Point Dosage Given to Date: 26.6 Gy
Reference Point Session Dosage Given: 2.66 Gy
Session Number: 10

## 2021-11-22 ENCOUNTER — Ambulatory Visit
Admission: RE | Admit: 2021-11-22 | Discharge: 2021-11-22 | Disposition: A | Discharge status: Home / Self Care | Payer: Medicare Other | Source: Ambulatory Visit | Attending: Radiation Oncology | Admitting: Radiation Oncology

## 2021-11-22 ENCOUNTER — Other Ambulatory Visit: Payer: Self-pay

## 2021-11-22 DIAGNOSIS — Z510 Encounter for antineoplastic radiation therapy: Secondary | ICD-10-CM | POA: Diagnosis not present

## 2021-11-22 LAB — RAD ONC ARIA SESSION SUMMARY
Course Elapsed Days: 15
Plan Fractions Treated to Date: 11
Plan Prescribed Dose Per Fraction: 2.66 Gy
Plan Total Fractions Prescribed: 16
Plan Total Prescribed Dose: 42.56 Gy
Reference Point Dosage Given to Date: 29.26 Gy
Reference Point Session Dosage Given: 2.66 Gy
Session Number: 11

## 2021-11-25 ENCOUNTER — Other Ambulatory Visit: Payer: Self-pay

## 2021-11-25 ENCOUNTER — Ambulatory Visit
Admission: RE | Admit: 2021-11-25 | Discharge: 2021-11-25 | Disposition: A | Discharge status: Home / Self Care | Payer: Medicare Other | Source: Ambulatory Visit | Attending: Radiation Oncology | Admitting: Radiation Oncology

## 2021-11-25 DIAGNOSIS — Z510 Encounter for antineoplastic radiation therapy: Secondary | ICD-10-CM | POA: Diagnosis not present

## 2021-11-25 LAB — RAD ONC ARIA SESSION SUMMARY
Course Elapsed Days: 18
Plan Fractions Treated to Date: 12
Plan Prescribed Dose Per Fraction: 2.66 Gy
Plan Total Fractions Prescribed: 16
Plan Total Prescribed Dose: 42.56 Gy
Reference Point Dosage Given to Date: 31.92 Gy
Reference Point Session Dosage Given: 2.66 Gy
Session Number: 12

## 2021-11-26 ENCOUNTER — Ambulatory Visit
Admission: RE | Admit: 2021-11-26 | Discharge: 2021-11-26 | Disposition: A | Discharge status: Home / Self Care | Payer: Medicare Other | Source: Ambulatory Visit | Attending: Radiation Oncology | Admitting: Radiation Oncology

## 2021-11-26 ENCOUNTER — Other Ambulatory Visit: Payer: Self-pay

## 2021-11-26 DIAGNOSIS — Z510 Encounter for antineoplastic radiation therapy: Secondary | ICD-10-CM | POA: Diagnosis not present

## 2021-11-26 LAB — RAD ONC ARIA SESSION SUMMARY
Course Elapsed Days: 19
Plan Fractions Treated to Date: 13
Plan Prescribed Dose Per Fraction: 2.66 Gy
Plan Total Fractions Prescribed: 16
Plan Total Prescribed Dose: 42.56 Gy
Reference Point Dosage Given to Date: 34.58 Gy
Reference Point Session Dosage Given: 2.66 Gy
Session Number: 13

## 2021-11-27 ENCOUNTER — Other Ambulatory Visit: Payer: Self-pay

## 2021-11-27 ENCOUNTER — Ambulatory Visit
Admission: RE | Admit: 2021-11-27 | Discharge: 2021-11-27 | Disposition: A | Discharge status: Home / Self Care | Payer: Medicare Other | Source: Ambulatory Visit | Attending: Radiation Oncology | Admitting: Radiation Oncology

## 2021-11-27 DIAGNOSIS — Z510 Encounter for antineoplastic radiation therapy: Secondary | ICD-10-CM | POA: Diagnosis not present

## 2021-11-27 LAB — RAD ONC ARIA SESSION SUMMARY
Course Elapsed Days: 20
Plan Fractions Treated to Date: 14
Plan Prescribed Dose Per Fraction: 2.66 Gy
Plan Total Fractions Prescribed: 16
Plan Total Prescribed Dose: 42.56 Gy
Reference Point Dosage Given to Date: 37.24 Gy
Reference Point Session Dosage Given: 2.66 Gy
Session Number: 14

## 2021-11-28 ENCOUNTER — Other Ambulatory Visit: Payer: Self-pay | Admitting: General Surgery

## 2021-11-28 ENCOUNTER — Ambulatory Visit (INDEPENDENT_AMBULATORY_CARE_PROVIDER_SITE_OTHER): Payer: Medicare Other | Admitting: Physician Assistant

## 2021-11-28 ENCOUNTER — Other Ambulatory Visit: Payer: Self-pay

## 2021-11-28 ENCOUNTER — Ambulatory Visit
Admission: RE | Admit: 2021-11-28 | Discharge: 2021-11-28 | Disposition: A | Discharge status: Home / Self Care | Payer: Medicare Other | Source: Ambulatory Visit | Attending: Radiation Oncology | Admitting: Radiation Oncology

## 2021-11-28 ENCOUNTER — Encounter: Payer: Self-pay | Admitting: Physician Assistant

## 2021-11-28 ENCOUNTER — Ambulatory Visit: Payer: Medicare Other

## 2021-11-28 VITALS — BP 122/74 | HR 71 | Temp 98.1°F | Ht 60.0 in | Wt 146.5 lb

## 2021-11-28 DIAGNOSIS — F5101 Primary insomnia: Secondary | ICD-10-CM

## 2021-11-28 DIAGNOSIS — F339 Major depressive disorder, recurrent, unspecified: Secondary | ICD-10-CM | POA: Diagnosis not present

## 2021-11-28 DIAGNOSIS — F419 Anxiety disorder, unspecified: Secondary | ICD-10-CM

## 2021-11-28 DIAGNOSIS — R350 Frequency of micturition: Secondary | ICD-10-CM

## 2021-11-28 DIAGNOSIS — E785 Hyperlipidemia, unspecified: Secondary | ICD-10-CM

## 2021-11-28 DIAGNOSIS — G8929 Other chronic pain: Secondary | ICD-10-CM

## 2021-11-28 DIAGNOSIS — M545 Low back pain, unspecified: Secondary | ICD-10-CM

## 2021-11-28 DIAGNOSIS — Z510 Encounter for antineoplastic radiation therapy: Secondary | ICD-10-CM | POA: Diagnosis not present

## 2021-11-28 LAB — RAD ONC ARIA SESSION SUMMARY
Course Elapsed Days: 21
Plan Fractions Treated to Date: 15
Plan Prescribed Dose Per Fraction: 2.66 Gy
Plan Total Fractions Prescribed: 16
Plan Total Prescribed Dose: 42.56 Gy
Reference Point Dosage Given to Date: 39.9 Gy
Reference Point Session Dosage Given: 2.66 Gy
Session Number: 15

## 2021-11-28 MED ORDER — MONTELUKAST SODIUM 10 MG PO TABS: 10.0000 mg | ORAL_TABLET | Freq: Every day | ORAL | 1 refills | Status: DC

## 2021-11-28 MED ORDER — CELECOXIB 100 MG PO CAPS
100.0000 mg | ORAL_CAPSULE | Freq: Every day | ORAL | 1 refills | Status: DC
Start: 2021-11-28 — End: 2022-03-04

## 2021-11-28 MED ORDER — TRAZODONE HCL 100 MG PO TABS: 200.0000 mg | ORAL_TABLET | Freq: Every day | ORAL | 1 refills | Status: DC

## 2021-11-28 MED ORDER — SERTRALINE HCL 50 MG PO TABS: 50.0000 mg | ORAL_TABLET | Freq: Every day | ORAL | 1 refills | Status: DC

## 2021-11-28 MED ORDER — GABAPENTIN 600 MG PO TABS: ORAL_TABLET | ORAL | 1 refills | Status: DC

## 2021-11-28 MED ORDER — ATORVASTATIN CALCIUM 20 MG PO TABS: 20.0000 mg | ORAL_TABLET | Freq: Every day | ORAL | 3 refills | Status: DC

## 2021-11-28 MED ORDER — TIZANIDINE HCL 4 MG PO TABS: 4.0000 mg | ORAL_TABLET | Freq: Three times a day (TID) | ORAL | 1 refills | Status: DC

## 2021-11-28 MED ORDER — CYCLOBENZAPRINE HCL 10 MG PO TABS: 10.0000 mg | ORAL_TABLET | Freq: Three times a day (TID) | ORAL | 1 refills | Status: DC

## 2021-11-28 NOTE — Patient Instructions (Signed)
It was great to see you!  Stop lexapro and start zoloft 50 mg --> tell someone you trust that you are starting this medication Follow-up in 1 month, sooner if concerns  If you develop suicidal thoughts, please tell someone and immediately proceed to our local 24/7 crisis center, [facility] at the [facility]. [address] [place], [place] [address] [phone].  Take care,  Inda Coke PA-C

## 2021-11-28 NOTE — Progress Notes (Signed)
Holly Phillips is a 66 y.o. female here for a follow up on anxiety.   History of Present Illness:   Chief Complaint  Patient presents with   Anxiety    Discuss anxiety, doesn't feel like Lexapro is working    Discuss medication    Need refill of omeprazole    HPI  Anxiety/Depression Patient here to follow-up. She is currently compliant with taking Lexapro 20 mg daily, but states she does not feel like this has been helping. She was on Trintellix  20 mg daily in the past for depression. At that time, this was helping. She was restarted on Lexapro on 05/27/2021 after running out off her medication. At this time, she is interested to trialing new medication for this issue. Denies any worsening sx. Denies SI/HI.   Insomnia/Frequent urination She is currently compliant with taking Trazodone 200 mg daily but still has been struggling to sleep. States she takes her medication around 8 pm and tries to go to sleep around 9-10 pm. However, she wakes up several time during the night for urination. This is new problem for her. Denies hematuria or dysuria. Denies any other urinary sx.   COPD She is compliant with taking singulair 10 mg daily with no complications. She is requesting refill on Singulair 10 mg daily. She is also compliant with using symbicort 160-4.5 mcg ACT inhaler- 2 puffs twice daily and albuterol 108 mcg base inhaler as needed with no complications. Denies any worsening sx.   HLD She is taking Lipitor 20 mg daily with no complications. Requesting refill on this today.   Chronic pain Currently taking celebrex 100 mg daily, gabapentin 600 mg BID and 1200 mg nightly, zanaflex 4 mg TID, flexeril 10 mg TID. She also takes percocet -- this is managed by another provider. She has been on this regimen for several years.  Past Medical History:  Diagnosis Date   Anxiety    Arthritis    Asthma    Chronic back pain    Colon polyps    COPD (chronic obstructive pulmonary disease)  (HCC)    Depression    Emphysema of lung ([place])    Family history of breast cancer    GERD (gastroesophageal reflux disease)    Memory change    Port-A-Cath in place 08/01/2021   Sleep apnea    TIA (transient ischemic attack)    Tremor      Social History   Tobacco Use   Smoking status: Former    Packs/day: 0.50    Types: Cigarettes    Quit date: 08/17/2021    Years since quitting: 0.2   Smokeless tobacco: Never   Tobacco comments:    Used to smoke a pack a day, Not smoking cigarettes but is vaping  09/12/2021  Vaping Use   Vaping Use: Every day   Substances: Flavoring  Substance Use Topics   Alcohol use: Not Currently   Drug use: Not Currently    Types: Other-see comments    Comment: CBD and medical marijuana- denies 8/28/2,04/22/21    Past Surgical History:  Procedure Laterality Date   ABDOMINAL HYSTERECTOMY     BREAST LUMPECTOMY WITH RADIOFREQUENCY TAG IDENTIFICATION Left 12/01/9676   Procedure: BREAST LUMPECTOMY WITH RADIOFREQUENCY TAG IDENTIFICATION;  Surgeon: Virl Cagey, MD;  Location: [facility];  Service: General;  Laterality: Left;   CERVICAL SPINE SURGERY     INNER EAR SURGERY     LUMBAR FUSION  2012   L3-L7   PARTIAL MASTECTOMY WITH  AXILLARY SENTINEL LYMPH NODE BIOPSY Left 05/31/2021   Procedure: PARTIAL MASTECTOMY WITH AXILLARY SENTINEL LYMPH NODE BIOPSY;  Surgeon: Virl Cagey, MD;  Location: [facility];  Service: General;  Laterality: Left;   PORTACATH PLACEMENT Right 07/30/2021   Procedure: INSERTION PORT-A-CATH;  Surgeon: Virl Cagey, MD;  Location: [facility];  Service: General;  Laterality: Right;    Family History  Problem Relation Age of Onset   Depression Mother    Diabetes Mother    Hypertension Mother    Hyperlipidemia Mother    Heart attack Mother    Osteoarthritis Father    Asthma Father    COPD Father    Breast cancer Sister        dx 44s-60s   Lupus Sister    Osteoarthritis Sister    Asthma Sister    COPD Sister    Diabetes  Sister    Drug abuse Sister    Heart attack Sister    Heart attack Maternal Grandmother    Colon cancer Neg Hx    Esophageal cancer Neg Hx     Allergies  Allergen Reactions   Mucinex [Guaifenesin Er] Other (See Comments)    Jerky movements    Current Medications:   Current Outpatient Medications:    albuterol (PROVENTIL) (2.5 MG/3ML) 0.083% nebulizer solution, Take 3 mLs (2.5 mg total) by nebulization every 6 (six) hours as needed for wheezing or shortness of breath., Disp: 150 mL, Rfl: 1   albuterol (VENTOLIN HFA) 108 (90 Base) MCG/ACT inhaler, INHALE 1 TO 2 PUFFS INTO THE LUNGS EVERY 6 HOURS AS NEEDED FOR WHEEZING OR SHORTNESS OF BREATH (Patient taking differently: 1-2 puffs every 6 (six) hours as needed for wheezing or shortness of breath.), Disp: 18 g, Rfl: 5   budesonide-formoterol (SYMBICORT) 160-4.5 MCG/ACT inhaler, Inhale 2 puffs into the lungs 2 (two) times daily., Disp: 10.2 g, Rfl: 11   diclofenac Sodium (VOLTAREN) 1 % GEL, SMARTSIG:Gram(s) Topical Twice Daily, Disp: , Rfl:    lidocaine (LIDODERM) 5 %, Place 3 patches onto the skin daily as needed (pain)., Disp: , Rfl:    omeprazole (PRILOSEC) 40 MG capsule, Take 1 capsule (40 mg total) by mouth 2 (two) times daily., Disp: 90 capsule, Rfl: 1   oxyCODONE-acetaminophen (PERCOCET) 10-325 MG tablet, Take 1 tablet by mouth 6 (six) times daily., Disp: , Rfl:    sertraline (ZOLOFT) 50 MG tablet, Take 1 tablet (50 mg total) by mouth daily., Disp: 30 tablet, Rfl: 1   SPIRIVA RESPIMAT 2.5 MCG/ACT AERS, INHALE 2 PUFFS INTO THE LUNGS DAILY, Disp: 4 g, Rfl: 3   atorvastatin (LIPITOR) 20 MG tablet, Take 1 tablet (20 mg total) by mouth daily., Disp: 90 tablet, Rfl: 3   celecoxib (CELEBREX) 100 MG capsule, Take 1 capsule (100 mg total) by mouth daily., Disp: 90 capsule, Rfl: 1   cyclobenzaprine (FLEXERIL) 10 MG tablet, Take 1 tablet (10 mg total) by mouth 3 (three) times daily., Disp: 90 tablet, Rfl: 1   CYCLOPHOSPHAMIDE IV, Inject into the  vein every 21 ( twenty-one) days. X 4 cycles (Patient not taking: Reported on 11/28/2021), Disp: , Rfl:    DOCETAXEL IV, Inject into the vein every 21 ( twenty-one) days. X 4 cycles (Patient not taking: Reported on 11/28/2021), Disp: , Rfl:    furosemide (LASIX) 20 MG tablet, Take 20 mg by mouth daily as needed for fluid. (Patient not taking: Reported on 11/28/2021), Disp: , Rfl:    gabapentin (NEURONTIN) 600 MG tablet, Take 1 tablet  in AM, 1 tablet at noon, and 2 tablets at bedtime., Disp: 360 tablet, Rfl: 1   montelukast (SINGULAIR) 10 MG tablet, Take 1 tablet (10 mg total) by mouth at bedtime., Disp: 90 tablet, Rfl: 1   naloxone (NARCAN) nasal spray 4 mg/0.1 mL, , Disp: , Rfl:    ondansetron (ZOFRAN) 4 MG tablet, Take 1 tablet (4 mg total) by mouth every 8 (eight) hours as needed. (Patient not taking: Reported on 11/28/2021), Disp: 30 tablet, Rfl: 1   tiZANidine (ZANAFLEX) 4 MG tablet, Take 1 tablet (4 mg total) by mouth 3 (three) times daily., Disp: 270 tablet, Rfl: 1   traZODone (DESYREL) 100 MG tablet, Take 2 tablets (200 mg total) by mouth at bedtime., Disp: 180 tablet, Rfl: 1   Review of Systems:   ROS Negative unless otherwise specified per HPI.   Vitals:   Vitals:   11/28/21 0857  BP: 122/74  Pulse: 71  Temp: 98.1 F (36.7 C)  TempSrc: Temporal  SpO2: 96%  Weight: 146 lb 8 oz (66.5 kg)  Height: 5' (1.524 m)     Body mass index is 28.61 kg/m.  Physical Exam:   Physical Exam Vitals and nursing note reviewed.  Constitutional:      General: She is not in acute distress.    Appearance: She is well-developed. She is not ill-appearing or toxic-appearing.  Cardiovascular:     Rate and Rhythm: Normal rate and regular rhythm.     Pulses: Normal pulses.     Heart sounds: Normal heart sounds, S1 normal and S2 normal.  Pulmonary:     Effort: Pulmonary effort is normal.     Breath sounds: Normal breath sounds.  Skin:    General: Skin is warm and dry.  Neurological:     Mental  Status: She is alert.     GCS: GCS eye subscore is 4. GCS verbal subscore is 5. GCS motor subscore is 6.  Psychiatric:        Speech: Speech normal.        Behavior: Behavior normal. Behavior is cooperative.     Assessment and Plan:   Anxiety; Recurrent depression ([place]) Uncontrolled Denies SI/HI Stop lexapro 20 Start zoloft 50 mg daily I discussed with patient that if they develop any SI, to tell someone immediately and seek medical attention. Follow-up 1 month  Primary insomnia Uncontrolled Continue trazodone 200 mg daily but I would like to decrease this at next visit -- we discussed this briefly  Frequent urination Declines work-up today; consider urology and pelvic floor PT  Hyperlipidemia, unspecified hyperlipidemia type Well controlled Continue lipitor 20 mg daily  Chronic bilateral low back pain without sciatica Continue celebrex 100 mg daily, gabapentin 600 mg BID and 1200 mg nightly, zanaflex 4 mg TID, flexeril 10 mg TID. These medications control her sx and improve her quality of life without falls or other side effects Follow-up in 6 months, sooner if concerns  I,Savera Zaman,acting as a scribe for [employer], PA.,have documented all relevant documentation on the behalf of Inda Coke, PA,as directed by  Inda Coke, PA while in the presence of Inda Coke, [place].    I, Inda Coke, [place], have reviewed all documentation for this visit. The documentation on 11/28/21 for the exam, diagnosis, procedures, and orders are all accurate and complete.   Inda Coke, PA-C

## 2021-11-29 ENCOUNTER — Other Ambulatory Visit: Payer: Self-pay

## 2021-11-29 ENCOUNTER — Ambulatory Visit
Admission: RE | Admit: 2021-11-29 | Discharge: 2021-11-29 | Disposition: A | Discharge status: Home / Self Care | Payer: Medicare Other | Source: Ambulatory Visit | Attending: Radiation Oncology | Admitting: Radiation Oncology

## 2021-11-29 ENCOUNTER — Ambulatory Visit: Payer: Medicare Other

## 2021-11-29 DIAGNOSIS — Z510 Encounter for antineoplastic radiation therapy: Secondary | ICD-10-CM | POA: Diagnosis not present

## 2021-11-29 LAB — RAD ONC ARIA SESSION SUMMARY
Course Elapsed Days: 22
Plan Fractions Treated to Date: 16
Plan Prescribed Dose Per Fraction: 2.66 Gy
Plan Total Fractions Prescribed: 16
Plan Total Prescribed Dose: 42.56 Gy
Reference Point Dosage Given to Date: 42.56 Gy
Reference Point Session Dosage Given: 2.66 Gy
Session Number: 16

## 2021-12-02 ENCOUNTER — Other Ambulatory Visit: Payer: Self-pay

## 2021-12-02 ENCOUNTER — Ambulatory Visit
Admission: RE | Admit: 2021-12-02 | Discharge: 2021-12-02 | Disposition: A | Discharge status: Home / Self Care | Payer: Medicare Other | Source: Ambulatory Visit | Attending: Radiation Oncology | Admitting: Radiation Oncology

## 2021-12-02 ENCOUNTER — Ambulatory Visit: Payer: Medicare Other

## 2021-12-02 DIAGNOSIS — Z510 Encounter for antineoplastic radiation therapy: Secondary | ICD-10-CM | POA: Diagnosis not present

## 2021-12-02 LAB — RAD ONC ARIA SESSION SUMMARY
Course Elapsed Days: 25
Plan Fractions Treated to Date: 1
Plan Prescribed Dose Per Fraction: 2 Gy
Plan Total Fractions Prescribed: 4
Plan Total Prescribed Dose: 8 Gy
Reference Point Dosage Given to Date: 44.56 Gy
Reference Point Session Dosage Given: 2 Gy
Session Number: 17

## 2021-12-02 NOTE — Progress Notes (Signed)
                                                                                                                                                              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 She developed fatigue and anticipated skin changes in the treatment field.   Plan: The patient will receive a call in about one month from the radiation oncology department. She will continue follow up with Dr. Delton Coombes as well.   ________________________________________________    Carola Rhine, [facility]

## 2021-12-03 ENCOUNTER — Ambulatory Visit
Admission: RE | Admit: 2021-12-03 | Discharge: 2021-12-03 | Disposition: A | Discharge status: Home / Self Care | Payer: Medicare Other | Source: Ambulatory Visit | Attending: Radiation Oncology | Admitting: Radiation Oncology

## 2021-12-03 ENCOUNTER — Other Ambulatory Visit: Payer: Self-pay

## 2021-12-03 ENCOUNTER — Ambulatory Visit: Payer: Medicare Other

## 2021-12-03 DIAGNOSIS — Z510 Encounter for antineoplastic radiation therapy: Secondary | ICD-10-CM | POA: Diagnosis not present

## 2021-12-03 LAB — RAD ONC ARIA SESSION SUMMARY
Course Elapsed Days: 26
Plan Fractions Treated to Date: 2
Plan Prescribed Dose Per Fraction: 2 Gy
Plan Total Fractions Prescribed: 4
Plan Total Prescribed Dose: 8 Gy
Reference Point Dosage Given to Date: 46.56 Gy
Reference Point Session Dosage Given: 2 Gy
Session Number: 18

## 2021-12-04 ENCOUNTER — Other Ambulatory Visit: Payer: Self-pay

## 2021-12-04 ENCOUNTER — Ambulatory Visit (INDEPENDENT_AMBULATORY_CARE_PROVIDER_SITE_OTHER): Payer: Medicare Other | Admitting: Pulmonary Disease

## 2021-12-04 ENCOUNTER — Ambulatory Visit
Admission: RE | Admit: 2021-12-04 | Discharge: 2021-12-04 | Disposition: A | Discharge status: Home / Self Care | Payer: Medicare Other | Source: Ambulatory Visit | Attending: Radiation Oncology | Admitting: Radiation Oncology

## 2021-12-04 ENCOUNTER — Encounter: Payer: Self-pay | Admitting: Pulmonary Disease

## 2021-12-04 VITALS — BP 130/70 | HR 102 | Temp 98.2°F | Ht 58.5 in | Wt 145.2 lb

## 2021-12-04 DIAGNOSIS — J449 Chronic obstructive pulmonary disease, unspecified: Secondary | ICD-10-CM

## 2021-12-04 DIAGNOSIS — Z510 Encounter for antineoplastic radiation therapy: Secondary | ICD-10-CM | POA: Diagnosis not present

## 2021-12-04 DIAGNOSIS — J9611 Chronic respiratory failure with hypoxia: Secondary | ICD-10-CM

## 2021-12-04 LAB — PULMONARY FUNCTION TEST
DL/VA % pred: 78 %
DL/VA: 3.39 ml/min/mmHg/L
DLCO cor % pred: 69 %
DLCO cor: 11.28 ml/min/mmHg
DLCO unc % pred: 69 %
DLCO unc: 11.28 ml/min/mmHg
FEF 25-75 Post: 1.07 L/sec
FEF 25-75 Pre: 0.72 L/sec
FEF2575-%Change-Post: 47 %
FEF2575-%Pred-Post: 61 %
FEF2575-%Pred-Pre: 41 %
FEV1-%Change-Post: 8 %
FEV1-%Pred-Post: 70 %
FEV1-%Pred-Pre: 64 %
FEV1-Post: 1.31 L
FEV1-Pre: 1.21 L
FEV1FVC-%Change-Post: 0 %
FEV1FVC-%Pred-Pre: 91 %
FEV6-%Change-Post: 10 %
FEV6-%Pred-Post: 80 %
FEV6-%Pred-Pre: 72 %
FEV6-Post: 1.88 L
FEV6-Pre: 1.71 L
FEV6FVC-%Change-Post: 0 %
FEV6FVC-%Pred-Post: 104 %
FEV6FVC-%Pred-Pre: 104 %
FVC-%Change-Post: 9 %
FVC-%Pred-Post: 76 %
FVC-%Pred-Pre: 70 %
FVC-Post: 1.89 L
FVC-Pre: 1.72 L
Post FEV1/FVC ratio: 69 %
Post FEV6/FVC ratio: 100 %
Pre FEV1/FVC ratio: 70 %
Pre FEV6/FVC Ratio: 100 %
RV % pred: 112 %
RV: 2.09 L
TLC % pred: 96 %
TLC: 4.1 L

## 2021-12-04 LAB — RAD ONC ARIA SESSION SUMMARY
Course Elapsed Days: 27
Plan Fractions Treated to Date: 3
Plan Prescribed Dose Per Fraction: 2 Gy
Plan Total Fractions Prescribed: 4
Plan Total Prescribed Dose: 8 Gy
Reference Point Dosage Given to Date: 48.56 Gy
Reference Point Session Dosage Given: 2 Gy
Session Number: 19

## 2021-12-04 NOTE — Progress Notes (Signed)
Full PFT Performed Today  

## 2021-12-04 NOTE — Patient Instructions (Signed)
Full PFT Performed Today  

## 2021-12-04 NOTE — Patient Instructions (Signed)
Will make sure your address gets updated in our computer system  Follow up in 6 months

## 2021-12-04 NOTE — Progress Notes (Signed)
[facility]  Chief Complaint  Patient presents with   Follow-up    F/u after PFT.  Not wearing CPAP machine.  Family says she snores bad.    Constitutional:  BP 130/70 (BP Location: Right Arm, Patient Position: Sitting, Cuff Size: Normal)   Pulse (!) 102   Temp 98.2 F (36.8 C) (Oral)   Ht 4' 10.5" (1.486 m)   Wt 145 lb 3.2 oz (65.9 kg)   LMP  (LMP Unknown)   SpO2 93%   BMI 29.83 kg/m   Past Medical History:  Anxiety, OA, Back pain, Colon polyps, Depression, COVID 07 July 2020  Past Surgical History:  She  has a past surgical history that includes Abdominal hysterectomy; Lumbar fusion (2012); Inner ear surgery; Cervical spine surgery; Partial mastectomy with axillary sentinel lymph node biopsy (Left, 05/31/2021); Breast lumpectomy with radiofrequency tag identification (Left, 05/31/2021); and Portacath placement (Right, 07/30/2021).  Brief Summary:  Holly Phillips is a 66 y.o. female former smoker with COPD/emphysema, chronic hypoxic respiratory failure and obstructive sleep apnea intolerant of CPAP.      Subjective:   Breathing has been good.  Not having cough, wheeze, or sputum.  PFT today shows mild obstruction and diffusion defect.  Inhalers working well.  Her last round of XRT is later this week.  She uses oxygen at night and prn during the day, but hasn't needed to use oxygen during the day as much.  Physical Exam:   Appearance - well kempt   ENMT - no sinus tenderness, no oral exudate, no LAN, Mallampati 3 airway, no stridor  Respiratory - equal breath sounds bilaterally, no wheezing or rales  CV - s1s2 regular rate and rhythm, no murmurs  Ext - no clubbing, no edema  Skin - no rashes  Psych - normal mood and affect    Pulmonary testing:  PFT 12/04/21 >> FEV1 1.89 (76%), FEV1% 69, TLC 4.10 (96%), DLCO 69%  Chest Imaging:  LDCT chest 01/19/20 >> moderate centrilobular emphysema CT angio chest 07/18/20 >>  centrilobular emphysema with subpleural scarring, mild ASD RUL  Sleep Tests:  HST 01/22/17 >> AHI 9.7  Cardiac Tests:  Echo 08/31/21 >> EF 70 to 75%, grade 1 DD, small pericardial effusion, mild MR  Social History:  She  reports that she quit smoking about 3 months ago. Her smoking use included cigarettes. She smoked an average of .5 packs per day. She has never used smokeless tobacco. She reports that she does not currently use alcohol. She reports that she does not currently use drugs after having used the following drugs: Other-see comments.  Family History:  Her family history includes Asthma in her father and sister; Breast cancer in her sister; COPD in her father and sister; Depression in her mother; Diabetes in her mother and sister; Drug abuse in her sister; Heart attack in her maternal grandmother, mother, and sister; Hyperlipidemia in her mother; Hypertension in her mother; Lupus in her sister; Osteoarthritis in her father and sister.     Assessment/Plan:   COPD with emphysema and chronic bronchitis. - continue spiriva respimat two puffs bid, and symbicort 160 two puffs bid - prn albuterol  Chronic respiratory failure with hypoxia. - continue 3 liters oxygen at night and as needed with exertion - goal SpO2 > 92%  Lt breast cancer - diagnosed December 2022 - followed by Dr. Ellin Saba with oncology  Time Spent Involved in Patient Care on Day of Examination:  27 minutes  Follow  up:   Patient Instructions  Will make sure your address gets updated in our computer system  Follow up in 6 months  Medication List:   Allergies as of 12/04/2021       Reactions   Mucinex [guaifenesin Er] Other (See Comments)   Jerky movements        Medication List        Accurate as of December 04, 2021  2:29 PM. If you have any questions, ask your nurse or doctor.          albuterol (2.5 MG/3ML) 0.083% nebulizer solution Commonly known as: PROVENTIL Take 3 mLs (2.5 mg total)  by nebulization every 6 (six) hours as needed for wheezing or shortness of breath. What changed: Another medication with the same name was changed. Make sure you understand how and when to take each.   albuterol 108 (90 Base) MCG/ACT inhaler Commonly known as: VENTOLIN HFA INHALE 1 TO 2 PUFFS INTO THE LUNGS EVERY 6 HOURS AS NEEDED FOR WHEEZING OR SHORTNESS OF BREATH What changed: See the new instructions.   atorvastatin 20 MG tablet Commonly known as: LIPITOR Take 1 tablet (20 mg total) by mouth daily.   budesonide-formoterol 160-4.5 MCG/ACT inhaler Commonly known as: SYMBICORT Inhale 2 puffs into the lungs 2 (two) times daily.   celecoxib 100 MG capsule Commonly known as: CELEBREX Take 1 capsule (100 mg total) by mouth daily.   cyclobenzaprine 10 MG tablet Commonly known as: FLEXERIL Take 1 tablet (10 mg total) by mouth 3 (three) times daily.   CYCLOPHOSPHAMIDE IV Inject into the vein every 21 ( twenty-one) days. X 4 cycles   diclofenac Sodium 1 % Gel Commonly known as: VOLTAREN SMARTSIG:Gram(s) Topical Twice Daily   DOCETAXEL IV Inject into the vein every 21 ( twenty-one) days. X 4 cycles   furosemide 20 MG tablet Commonly known as: LASIX Take 20 mg by mouth daily as needed for fluid.   gabapentin 600 MG tablet Commonly known as: Neurontin Take 1 tablet in AM, 1 tablet at noon, and 2 tablets at bedtime.   lidocaine 5 % Commonly known as: LIDODERM Place 3 patches onto the skin daily as needed (pain).   montelukast 10 MG tablet Commonly known as: SINGULAIR Take 1 tablet (10 mg total) by mouth at bedtime.   naloxone 4 MG/0.1ML Liqd nasal spray kit Commonly known as: NARCAN   omeprazole 40 MG capsule Commonly known as: PRILOSEC Take 1 capsule (40 mg total) by mouth 2 (two) times daily.   ondansetron 4 MG tablet Commonly known as: Zofran Take 1 tablet (4 mg total) by mouth every 8 (eight) hours as needed.   oxyCODONE-acetaminophen 10-325 MG tablet Commonly  known as: PERCOCET Take 1 tablet by mouth 6 (six) times daily.   sertraline 50 MG tablet Commonly known as: ZOLOFT Take 1 tablet (50 mg total) by mouth daily.   Spiriva Respimat 2.5 MCG/ACT Aers Generic drug: Tiotropium Bromide Monohydrate INHALE 2 PUFFS INTO THE LUNGS DAILY   tiZANidine 4 MG tablet Commonly known as: ZANAFLEX Take 1 tablet (4 mg total) by mouth 3 (three) times daily.   traZODone 100 MG tablet Commonly known as: DESYREL Take 2 tablets (200 mg total) by mouth at bedtime.        Signature:  Chesley Mires, MD [place] Pager - [phone] 12/04/2021, 2:29 PM

## 2021-12-05 ENCOUNTER — Other Ambulatory Visit: Payer: Self-pay

## 2021-12-05 ENCOUNTER — Ambulatory Visit
Admission: RE | Admit: 2021-12-05 | Discharge: 2021-12-05 | Disposition: A | Discharge status: Home / Self Care | Payer: Medicare Other | Source: Ambulatory Visit | Attending: Radiation Oncology | Admitting: Radiation Oncology

## 2021-12-05 ENCOUNTER — Encounter: Payer: Self-pay | Admitting: Radiation Oncology

## 2021-12-05 DIAGNOSIS — Z510 Encounter for antineoplastic radiation therapy: Secondary | ICD-10-CM | POA: Diagnosis not present

## 2021-12-05 LAB — RAD ONC ARIA SESSION SUMMARY
Course Elapsed Days: 28
Plan Fractions Treated to Date: 4
Plan Prescribed Dose Per Fraction: 2 Gy
Plan Total Fractions Prescribed: 4
Plan Total Prescribed Dose: 8 Gy
Reference Point Dosage Given to Date: 50.56 Gy
Reference Point Session Dosage Given: 2 Gy
Session Number: 20

## 2021-12-09 ENCOUNTER — Encounter (HOSPITAL_COMMUNITY): Payer: Self-pay | Admitting: Hematology

## 2021-12-17 ENCOUNTER — Telehealth: Payer: Self-pay | Admitting: Physician Assistant

## 2021-12-17 MED ORDER — OMEPRAZOLE 40 MG PO CPDR: 40.0000 mg | DELAYED_RELEASE_CAPSULE | Freq: Two times a day (BID) | ORAL | 1 refills | Status: DC

## 2021-12-17 NOTE — Telephone Encounter (Signed)
Pt notified Rx was sent to pharmacy for Omeprazole.

## 2021-12-17 NOTE — Telephone Encounter (Signed)
  LAST APPOINTMENT DATE:   11/28/21 OV with PCP   NEXT APPOINTMENT DATE: 01/06/22 OV with PCP   MEDICATION: omeprazole (PRILOSEC) 40 MG capsule [[id number]]    Is the patient out of medication?  Yes, "for a few days"   PHARMACY: [facility] [address] [place], [place] [address] [phone]

## 2021-12-31 ENCOUNTER — Ambulatory Visit: Payer: Medicare Other | Admitting: Physician Assistant

## 2022-01-02 ENCOUNTER — Inpatient Hospital Stay: Payer: Medicare Other | Attending: Hematology

## 2022-01-02 DIAGNOSIS — Z79811 Long term (current) use of aromatase inhibitors: Secondary | ICD-10-CM | POA: Diagnosis not present

## 2022-01-02 DIAGNOSIS — Z803 Family history of malignant neoplasm of breast: Secondary | ICD-10-CM | POA: Insufficient documentation

## 2022-01-02 DIAGNOSIS — Z923 Personal history of irradiation: Secondary | ICD-10-CM | POA: Diagnosis not present

## 2022-01-02 DIAGNOSIS — C50412 Malignant neoplasm of upper-outer quadrant of left female breast: Secondary | ICD-10-CM | POA: Diagnosis present

## 2022-01-02 DIAGNOSIS — F17290 Nicotine dependence, other tobacco product, uncomplicated: Secondary | ICD-10-CM | POA: Insufficient documentation

## 2022-01-02 DIAGNOSIS — Z170 Estrogen receptor positive status [ER+]: Secondary | ICD-10-CM | POA: Diagnosis not present

## 2022-01-02 DIAGNOSIS — Z8673 Personal history of transient ischemic attack (TIA), and cerebral infarction without residual deficits: Secondary | ICD-10-CM | POA: Insufficient documentation

## 2022-01-02 DIAGNOSIS — M810 Age-related osteoporosis without current pathological fracture: Secondary | ICD-10-CM | POA: Diagnosis not present

## 2022-01-02 DIAGNOSIS — Z90710 Acquired absence of both cervix and uterus: Secondary | ICD-10-CM | POA: Diagnosis not present

## 2022-01-02 LAB — CBC WITH DIFFERENTIAL/PLATELET
Abs Immature Granulocytes: 0.01 10*3/uL (ref 0.00–0.07)
Basophils Absolute: 0 10*3/uL (ref 0.0–0.1)
Basophils Relative: 1 %
Eosinophils Absolute: 0.1 10*3/uL (ref 0.0–0.5)
Eosinophils Relative: 2 %
HCT: 38 % (ref 36.0–46.0)
Hemoglobin: 12 g/dL (ref 12.0–15.0)
Immature Granulocytes: 0 %
Lymphocytes Relative: 22 %
Lymphs Abs: 1.5 10*3/uL (ref 0.7–4.0)
MCH: 31.3 pg (ref 26.0–34.0)
MCHC: 31.6 g/dL (ref 30.0–36.0)
MCV: 99.2 fL (ref 80.0–100.0)
Monocytes Absolute: 0.5 10*3/uL (ref 0.1–1.0)
Monocytes Relative: 8 %
Neutro Abs: 4.3 10*3/uL (ref 1.7–7.7)
Neutrophils Relative %: 67 %
Platelets: 357 10*3/uL (ref 150–400)
RBC: 3.83 MIL/uL — ABNORMAL LOW (ref 3.87–5.11)
RDW: 11.9 % (ref 11.5–15.5)
WBC: 6.5 10*3/uL (ref 4.0–10.5)
nRBC: 0 % (ref 0.0–0.2)

## 2022-01-02 LAB — COMPREHENSIVE METABOLIC PANEL
ALT: 20 U/L (ref 0–44)
AST: 24 U/L (ref 15–41)
Albumin: 3.9 g/dL (ref 3.5–5.0)
Alkaline Phosphatase: 86 U/L (ref 38–126)
Anion gap: 11 (ref 5–15)
BUN: 9 mg/dL (ref 8–23)
CO2: 29 mmol/L (ref 22–32)
Calcium: 9.4 mg/dL (ref 8.9–10.3)
Chloride: 102 mmol/L (ref 98–111)
Creatinine, Ser: 0.75 mg/dL (ref 0.44–1.00)
GFR, Estimated: >60 mL/min (ref >60)
Glucose, Bld: 125 mg/dL — ABNORMAL HIGH (ref 70–99)
Potassium: 3.7 mmol/L (ref 3.5–5.1)
Sodium: 142 mmol/L (ref 135–145)
Total Bilirubin: 0.3 mg/dL (ref 0.3–1.2)
Total Protein: 7.3 g/dL (ref 6.5–8.1)

## 2022-01-02 LAB — MAGNESIUM: Magnesium: 1.8 mg/dL (ref 1.7–2.4)

## 2022-01-02 LAB — VITAMIN D 25 HYDROXY (VIT D DEFICIENCY, FRACTURES): Vit D, 25-Hydroxy: 55.96 ng/mL (ref 30–100)

## 2022-01-02 NOTE — Progress Notes (Unsigned)
{  Select_TRH_Note:[id number]} 

## 2022-01-06 ENCOUNTER — Ambulatory Visit (INDEPENDENT_AMBULATORY_CARE_PROVIDER_SITE_OTHER): Payer: Medicare Other | Admitting: Physician Assistant

## 2022-01-06 ENCOUNTER — Encounter: Payer: Self-pay | Admitting: Physician Assistant

## 2022-01-06 VITALS — BP 130/62 | HR 92 | Temp 98.3°F | Ht 58.5 in | Wt 153.5 lb

## 2022-01-06 DIAGNOSIS — R2242 Localized swelling, mass and lump, left lower limb: Secondary | ICD-10-CM | POA: Diagnosis not present

## 2022-01-06 DIAGNOSIS — R1013 Epigastric pain: Secondary | ICD-10-CM | POA: Diagnosis not present

## 2022-01-06 DIAGNOSIS — Z23 Encounter for immunization: Secondary | ICD-10-CM | POA: Diagnosis not present

## 2022-01-06 DIAGNOSIS — F419 Anxiety disorder, unspecified: Secondary | ICD-10-CM

## 2022-01-06 DIAGNOSIS — F339 Major depressive disorder, recurrent, unspecified: Secondary | ICD-10-CM | POA: Diagnosis not present

## 2022-01-06 MED ORDER — PANTOPRAZOLE SODIUM 40 MG PO TBEC: 40.0000 mg | DELAYED_RELEASE_TABLET | Freq: Two times a day (BID) | ORAL | 1 refills | Status: DC

## 2022-01-06 MED ORDER — SERTRALINE HCL 50 MG PO TABS
50.0000 mg | ORAL_TABLET | Freq: Every day | ORAL | 1 refills | Status: DC
Start: 2022-01-06 — End: 2022-05-21

## 2022-01-06 MED ORDER — FUROSEMIDE 20 MG PO TABS
20.0000 mg | ORAL_TABLET | Freq: Every day | ORAL | 1 refills | Status: DC | PRN
Start: 2022-01-06 — End: 2022-03-06

## 2022-01-06 NOTE — Patient Instructions (Signed)
It was great to see you!  Stop your prilosec and start the protonix twice daily instead -- if this does not help let me know  Continue zoloft as is  Continue lasix as is -- follow-up with your pain doctor and let me know  Let's follow-up in 3-6 months, sooner if you have concerns.  Take care,  Inda Coke PA-C

## 2022-01-06 NOTE — Progress Notes (Signed)
Holly Phillips is a 66 y.o. female here for a follow up of a pre-existing problem.  History of Present Illness:   Chief Complaint  Patient presents with   Anxiety   Cough    HPI   Anxiety and Depression At her last visit about 1 month ago we stopped her Lexapro 20 mg daily and started Zoloft 50 mg daily. She is tolerating this well and feels like her symptoms are improving. She continues to take trazodone 200 mg daily.  Denies suicidal or homicidal ideation.  She does not want to decrease her trazodone.  Heartburn She feels like her heartburn is uncontrolled.  She states that she is taking her omeprazole 40 mg twice daily without any significant relief of symptoms.  She feels like sometimes she has a cough in her stomach area.  She was on Protonix at one point and would like to trial this.  Denies any blood in stool or bowel changes.  Lower extremity swelling Patient reports that she has a history of a left ankle strain and ever since then she has had some intermittent swelling in her lower leg.  She takes her Lasix 20 mg pretty much daily for this.  If she does not take this she feels tightness and worsening pain in that left lower leg.  Denies prior history of blood clot.  Denies any erythema or swelling in the calf.    Past Medical History:  Diagnosis Date   Anxiety    Arthritis    Asthma    Chronic back pain    Colon polyps    COPD (chronic obstructive pulmonary disease) (HCC)    Depression    Emphysema of lung ([place])    Family history of breast cancer    GERD (gastroesophageal reflux disease)    Memory change    Port-A-Cath in place 08/01/2021   Sleep apnea    TIA (transient ischemic attack)    Tremor      Social History   Tobacco Use   Smoking status: Former    Packs/day: 0.50    Types: Cigarettes    Quit date: 08/17/2021    Years since quitting: 0.3   Smokeless tobacco: Never   Tobacco comments:    Used to smoke a pack a day, Not smoking cigarettes but is  vaping  09/12/2021  Vaping Use   Vaping Use: Every day   Substances: Flavoring  Substance Use Topics   Alcohol use: Not Currently   Drug use: Not Currently    Types: Other-see comments    Comment: CBD and medical marijuana- denies 8/28/2,04/22/21    Past Surgical History:  Procedure Laterality Date   ABDOMINAL HYSTERECTOMY     BREAST LUMPECTOMY WITH RADIOFREQUENCY TAG IDENTIFICATION Left 11/24/6281   Procedure: BREAST LUMPECTOMY WITH RADIOFREQUENCY TAG IDENTIFICATION;  Surgeon: Virl Cagey, MD;  Location: [facility];  Service: General;  Laterality: Left;   CERVICAL SPINE SURGERY     INNER EAR SURGERY     LUMBAR FUSION  2012   L3-L7   PARTIAL MASTECTOMY WITH AXILLARY SENTINEL LYMPH NODE BIOPSY Left 05/31/2021   Procedure: PARTIAL MASTECTOMY WITH AXILLARY SENTINEL LYMPH NODE BIOPSY;  Surgeon: Virl Cagey, MD;  Location: [facility];  Service: General;  Laterality: Left;   PORTACATH PLACEMENT Right 07/30/2021   Procedure: INSERTION PORT-A-CATH;  Surgeon: Virl Cagey, MD;  Location: [facility];  Service: General;  Laterality: Right;    Family History  Problem Relation Age of Onset   Depression Mother  Diabetes Mother    Hypertension Mother    Hyperlipidemia Mother    Heart attack Mother    Osteoarthritis Father    Asthma Father    COPD Father    Breast cancer Sister        dx 73s-60s   Lupus Sister    Osteoarthritis Sister    Asthma Sister    COPD Sister    Diabetes Sister    Drug abuse Sister    Heart attack Sister    Heart attack Maternal Grandmother    Colon cancer Neg Hx    Esophageal cancer Neg Hx     Allergies  Allergen Reactions   Mucinex [Guaifenesin Er] Other (See Comments)    Jerky movements    Current Medications:   Current Outpatient Medications:    albuterol (PROVENTIL) (2.5 MG/3ML) 0.083% nebulizer solution, Take 3 mLs (2.5 mg total) by nebulization every 6 (six) hours as needed for wheezing or shortness of breath., Disp: 150 mL, Rfl: 1    albuterol (VENTOLIN HFA) 108 (90 Base) MCG/ACT inhaler, INHALE 1 TO 2 PUFFS INTO THE LUNGS EVERY 6 HOURS AS NEEDED FOR WHEEZING OR SHORTNESS OF BREATH (Patient taking differently: 1-2 puffs every 6 (six) hours as needed for wheezing or shortness of breath.), Disp: 18 g, Rfl: 5   atorvastatin (LIPITOR) 20 MG tablet, Take 1 tablet (20 mg total) by mouth daily., Disp: 90 tablet, Rfl: 3   budesonide-formoterol (SYMBICORT) 160-4.5 MCG/ACT inhaler, Inhale 2 puffs into the lungs 2 (two) times daily., Disp: 10.2 g, Rfl: 11   celecoxib (CELEBREX) 100 MG capsule, Take 1 capsule (100 mg total) by mouth daily., Disp: 90 capsule, Rfl: 1   cyclobenzaprine (FLEXERIL) 10 MG tablet, Take 1 tablet (10 mg total) by mouth 3 (three) times daily., Disp: 90 tablet, Rfl: 1   diclofenac Sodium (VOLTAREN) 1 % GEL, SMARTSIG:Gram(s) Topical Twice Daily, Disp: , Rfl:    furosemide (LASIX) 20 MG tablet, Take 20 mg by mouth daily as needed for fluid., Disp: , Rfl:    gabapentin (NEURONTIN) 600 MG tablet, Take 1 tablet in AM, 1 tablet at noon, and 2 tablets at bedtime., Disp: 360 tablet, Rfl: 1   lidocaine (LIDODERM) 5 %, Place 3 patches onto the skin daily as needed (pain)., Disp: , Rfl:    montelukast (SINGULAIR) 10 MG tablet, Take 1 tablet (10 mg total) by mouth at bedtime., Disp: 90 tablet, Rfl: 1   naloxone (NARCAN) nasal spray 4 mg/0.1 mL, , Disp: , Rfl:    omeprazole (PRILOSEC) 40 MG capsule, Take 1 capsule (40 mg total) by mouth 2 (two) times daily., Disp: 90 capsule, Rfl: 1   ondansetron (ZOFRAN) 4 MG tablet, Take 1 tablet (4 mg total) by mouth every 8 (eight) hours as needed., Disp: 30 tablet, Rfl: 1   oxyCODONE-acetaminophen (PERCOCET) 10-325 MG tablet, Take 1 tablet by mouth 6 (six) times daily., Disp: , Rfl:    sertraline (ZOLOFT) 50 MG tablet, Take 1 tablet (50 mg total) by mouth daily., Disp: 30 tablet, Rfl: 1   SPIRIVA RESPIMAT 2.5 MCG/ACT AERS, INHALE 2 PUFFS INTO THE LUNGS DAILY, Disp: 4 g, Rfl: 3   tiZANidine  (ZANAFLEX) 4 MG tablet, Take 1 tablet (4 mg total) by mouth 3 (three) times daily., Disp: 270 tablet, Rfl: 1   traZODone (DESYREL) 100 MG tablet, Take 2 tablets (200 mg total) by mouth at bedtime., Disp: 180 tablet, Rfl: 1   Review of Systems:   ROS Negative unless otherwise specified per  HPI.  Vitals:   Vitals:   01/06/22 0910  BP: 130/62  Pulse: 92  Temp: 98.3 F (36.8 C)  TempSrc: Temporal  Weight: 153 lb 8 oz (69.6 kg)  Height: 4' 10.5" (1.486 m)     Body mass index is 31.54 kg/m.  Physical Exam:   Physical Exam Vitals and nursing note reviewed.  Constitutional:      General: She is not in acute distress.    Appearance: She is well-developed. She is not ill-appearing or toxic-appearing.  Cardiovascular:     Rate and Rhythm: Normal rate and regular rhythm.     Pulses: Normal pulses.          Dorsalis pedis pulses are 2+ on the right side and 2+ on the left side.       Posterior tibial pulses are 2+ on the right side and 2+ on the left side.     Heart sounds: Normal heart sounds, S1 normal and S2 normal.  Pulmonary:     Effort: Pulmonary effort is normal.     Breath sounds: Normal breath sounds.  Musculoskeletal:     Comments: No obvious swelling to either lower extremity No erythema or swelling to calf  Skin:    General: Skin is warm and dry.  Neurological:     Mental Status: She is alert.     GCS: GCS eye subscore is 4. GCS verbal subscore is 5. GCS motor subscore is 6.  Psychiatric:        Speech: Speech normal.        Behavior: Behavior normal. Behavior is cooperative.     Assessment and Plan:   Recurrent depression ([place]); Anxiety No red flags Stable on Zoloft 50 mg daily She remains unwilling to decrease trazodone to 200 mg daily Concerns for possible serotonin syndrome discussed. Continue medication follow-up in 3 6 months, sooner if concerns Denies SI/HI on exam  Epigastric pain No red flags on exam We will trial stopping Prilosec and starting  Protonix to see if she has better results with this Consider referral to GI if this does not help her symptoms. Worsening precautions advised  Need for prophylactic vaccination against Streptococcus pneumoniae (pneumococcus) Updated today  Localized swelling of left lower leg No red flags Very low suspicion for any DVT she has no signs or symptoms presently Will refill Lasix 20 mg as needed Recommend that she continue to keep leg elevated and follow-up with her pain doctor if symptoms persist   Inda Coke, PA-C

## 2022-01-07 ENCOUNTER — Ambulatory Visit (INDEPENDENT_AMBULATORY_CARE_PROVIDER_SITE_OTHER): Payer: Medicare Other | Admitting: General Surgery

## 2022-01-07 ENCOUNTER — Encounter: Payer: Self-pay | Admitting: General Surgery

## 2022-01-07 ENCOUNTER — Other Ambulatory Visit: Payer: Self-pay | Admitting: *Deleted

## 2022-01-07 VITALS — BP 94/55 | HR 94 | Temp 98.2°F | Resp 16 | Ht 58.5 in | Wt 150.0 lb

## 2022-01-07 DIAGNOSIS — Z95828 Presence of other vascular implants and grafts: Secondary | ICD-10-CM | POA: Diagnosis not present

## 2022-01-07 MED ORDER — PANTOPRAZOLE SODIUM 40 MG PO TBEC: 40.0000 mg | DELAYED_RELEASE_TABLET | Freq: Two times a day (BID) | ORAL | 0 refills | Status: DC

## 2022-01-07 NOTE — Progress Notes (Signed)
[facility] History and Physical  Reason for Referral: Port in place  Referring Physician:  Inda Coke, PA   Chief Complaint   Follow-up     Holly Phillips is a 66 y.o. female.  HPI: Holly Phillips is a 66 yo who I know from her prior breast surgery for cancer and port placement. She is doing well and has completed her treatment. She is ready to get her port removed. She reports no new issues or problems with her port.   She was seen by Oncology recently and referred back for removal.   Past Medical History:  Diagnosis Date   Anxiety    Arthritis    Asthma    Chronic back pain    Colon polyps    COPD (chronic obstructive pulmonary disease) ([place])    Depression    Emphysema of lung ([place])    Family history of breast cancer    GERD (gastroesophageal reflux disease)    Memory change    Port-A-Cath in place 08/01/2021   Sleep apnea    TIA (transient ischemic attack)    Tremor     Past Surgical History:  Procedure Laterality Date   ABDOMINAL HYSTERECTOMY     BREAST LUMPECTOMY WITH RADIOFREQUENCY TAG IDENTIFICATION Left 8/89/1694   Procedure: BREAST LUMPECTOMY WITH RADIOFREQUENCY TAG IDENTIFICATION;  Surgeon: Virl Cagey, MD;  Location: [facility];  Service: General;  Laterality: Left;   CERVICAL SPINE SURGERY     INNER EAR SURGERY     LUMBAR FUSION  2012   L3-L7   PARTIAL MASTECTOMY WITH AXILLARY SENTINEL LYMPH NODE BIOPSY Left 05/31/2021   Procedure: PARTIAL MASTECTOMY WITH AXILLARY SENTINEL LYMPH NODE BIOPSY;  Surgeon: Virl Cagey, MD;  Location: [facility];  Service: General;  Laterality: Left;   PORTACATH PLACEMENT Right 07/30/2021   Procedure: INSERTION PORT-A-CATH;  Surgeon: Virl Cagey, MD;  Location: [facility];  Service: General;  Laterality: Right;    Family History  Problem Relation Age of Onset   Depression Mother    Diabetes Mother    Hypertension Mother    Hyperlipidemia Mother    Heart attack Mother    Osteoarthritis  Father    Asthma Father    COPD Father    Breast cancer Sister        dx 6s-60s   Lupus Sister    Osteoarthritis Sister    Asthma Sister    COPD Sister    Diabetes Sister    Drug abuse Sister    Heart attack Sister    Heart attack Maternal Grandmother    Colon cancer Neg Hx    Esophageal cancer Neg Hx     Social History   Tobacco Use   Smoking status: Former    Packs/day: 0.50    Types: Cigarettes    Quit date: 08/17/2021    Years since quitting: 0.3   Smokeless tobacco: Never   Tobacco comments:    Used to smoke a pack a day, Not smoking cigarettes but is vaping  09/12/2021  Vaping Use   Vaping Use: Every day   Substances: Flavoring  Substance Use Topics   Alcohol use: Not Currently   Drug use: Not Currently    Types: Other-see comments    Comment: CBD and medical marijuana- denies 8/28/2,04/22/21    Medications: I have reviewed the patient's current medications. Allergies as of 01/07/2022       Reactions   Mucinex [guaifenesin Er] Other (See Comments)   Jerky movements  Medication List        Accurate as of January 07, 2022 10:04 AM. If you have any questions, ask your nurse or doctor.          albuterol (2.5 MG/3ML) 0.083% nebulizer solution Commonly known as: PROVENTIL Take 3 mLs (2.5 mg total) by nebulization every 6 (six) hours as needed for wheezing or shortness of breath. What changed: Another medication with the same name was changed. Make sure you understand how and when to take each.   albuterol 108 (90 Base) MCG/ACT inhaler Commonly known as: VENTOLIN HFA INHALE 1 TO 2 PUFFS INTO THE LUNGS EVERY 6 HOURS AS NEEDED FOR WHEEZING OR SHORTNESS OF BREATH What changed: See the new instructions.   atorvastatin 20 MG tablet Commonly known as: LIPITOR Take 1 tablet (20 mg total) by mouth daily.   budesonide-formoterol 160-4.5 MCG/ACT inhaler Commonly known as: SYMBICORT Inhale 2 puffs into the lungs 2 (two) times daily.   celecoxib 100 MG  capsule Commonly known as: CELEBREX Take 1 capsule (100 mg total) by mouth daily.   cyclobenzaprine 10 MG tablet Commonly known as: FLEXERIL Take 1 tablet (10 mg total) by mouth 3 (three) times daily.   diclofenac Sodium 1 % Gel Commonly known as: VOLTAREN SMARTSIG:Gram(s) Topical Twice Daily   furosemide 20 MG tablet Commonly known as: LASIX Take 1 tablet (20 mg total) by mouth daily as needed for fluid.   gabapentin 600 MG tablet Commonly known as: Neurontin Take 1 tablet in AM, 1 tablet at noon, and 2 tablets at bedtime.   lidocaine 5 % Commonly known as: LIDODERM Place 3 patches onto the skin daily as needed (pain).   montelukast 10 MG tablet Commonly known as: SINGULAIR Take 1 tablet (10 mg total) by mouth at bedtime.   naloxone 4 MG/0.1ML Liqd nasal spray kit Commonly known as: NARCAN   ondansetron 4 MG tablet Commonly known as: Zofran Take 1 tablet (4 mg total) by mouth every 8 (eight) hours as needed.   oxyCODONE-acetaminophen 10-325 MG tablet Commonly known as: PERCOCET Take 1 tablet by mouth 6 (six) times daily.   pantoprazole 40 MG tablet Commonly known as: PROTONIX Take 1 tablet (40 mg total) by mouth 2 (two) times daily.   sertraline 50 MG tablet Commonly known as: ZOLOFT Take 1 tablet (50 mg total) by mouth daily.   Spiriva Respimat 2.5 MCG/ACT Aers Generic drug: Tiotropium Bromide Monohydrate INHALE 2 PUFFS INTO THE LUNGS DAILY   tiZANidine 4 MG tablet Commonly known as: ZANAFLEX Take 1 tablet (4 mg total) by mouth 3 (three) times daily.   traZODone 100 MG tablet Commonly known as: DESYREL Take 2 tablets (200 mg total) by mouth at bedtime.         ROS:  A comprehensive review of systems was negative except for: port in place  Blood pressure (!) 94/55, pulse 94, temperature 98.2 F (36.8 C), temperature source Oral, resp. rate 16, height 4' 10.5" (1.486 m), weight 150 lb (68 kg), SpO2 90 %. Physical Exam Vitals reviewed.   Constitutional:      Appearance: Normal appearance.  HENT:     Head: Normocephalic.     Mouth/Throat:     Mouth: Mucous membranes are moist.  Eyes:     Extraocular Movements: Extraocular movements intact.  Cardiovascular:     Rate and Rhythm: Normal rate.  Pulmonary:     Breath sounds: Normal breath sounds.  Chest:     Comments: Right chest port in place, incision  healed  Abdominal:     General: There is no distension.     Palpations: Abdomen is soft.  Musculoskeletal:        General: No swelling.     Cervical back: Normal range of motion.  Skin:    General: Skin is warm.  Neurological:     General: No focal deficit present.     Mental Status: She is alert.  Psychiatric:        Mood and Affect: Mood normal.        Thought Content: Thought content normal.     Results: None   Assessment & Plan:  Holly Phillips is a 66 y.o. female with a port in place after her breast cancer. Discussed removal under lidocaine. Discussed risk of bleeding, infection, and CXR after removal.   All questions were answered to the satisfaction of the patient.   Virl Cagey 01/07/2022, 10:04 AM

## 2022-01-07 NOTE — Patient Instructions (Signed)
Implanted Port Removal  Implanted port removal is a procedure to remove the port and catheter that are implanted under your skin. The port is a small disc under your skin that can be punctured with a needle. It is connected to a vein in your chest or neck by a small, thin tube (catheter). The implanted port is used to give medicines for treatments, and it may also be used to take blood samples. Your health care provider will remove the implanted port if: You no longer need it for treatment. It is not working properly. The area around it gets infected. Tell a health care provider about: Any allergies you have. All medicines you are taking, including vitamins, herbs, eye drops, creams, and over-the-counter medicines. Any problems you or family members have had with anesthetic medicines. Any bleeding problems you have. Any surgeries you have had. Any medical conditions you have. Whether you are pregnant or may be pregnant. What are the risks? Generally, this is a safe procedure. However, problems may occur, including: Infection. Bleeding. Allergic reactions to anesthetic medicines. Damage to nerves or blood vessels. What happens before the procedure? Medicines Ask your health care provider about: Changing or stopping your regular medicines. This is especially important if you are taking diabetes medicines or blood thinners. Taking medicines such as aspirin and ibuprofen. These medicines can thin your blood. Do not take these medicines unless your health care provider tells you to take them. Taking over-the-counter medicines, vitamins, herbs, and supplements. Tests You will have: A physical exam. Blood tests. Imaging tests, including a chest X-ray. General instructions Follow instructions from your health care provider about eating or drinking restrictions. Ask your health care provider: How your surgery site will be marked. What steps will be taken to help prevent infection. These  steps may include: Removing hair at the surgery site. Washing skin with a germ-killing soap. Taking antibiotic medicine. If you will be going home right after the procedure, plan to have a responsible adult: Take you home from the hospital or clinic. You will not be allowed to drive. Care for you for the time you are told. What happens during the procedure? You may be given one or more of the following: A medicine to help you relax (sedative). A medicine to numb the area (local anesthetic). A small incision will be made at the site of your implanted port. The implanted port and the catheter that has been inside your vein will be gently removed. The port and catheter will be inspected to make sure that all the parts have been removed. Part of the catheter may be tested for bacteria. The incision will be closed with stitches (sutures), adhesive strips, or skin glue. A bandage (dressing) will be placed over the incision. The health care provider may apply gentle pressure over the dressing for about 5 minutes. The procedure may vary among health care providers and hospitals. What happens after the procedure? Your blood pressure, heart rate, breathing rate, and blood oxygen level will be monitored until you leave the hospital or clinic. You will be monitored to make sure that there is no bleeding from the site where the port was removed. If you were given a sedative during the procedure, it can affect you for several hours. Do not drive or operate machinery until your health care provider says that it is safe. Summary Implanted port removal is a procedure to remove the port and catheter that are implanted under your skin. Before the procedure, follow your health  care provider's instructions about changing or stopping your regular medicines. This is especially important if you are taking diabetes medicines or blood thinners. If you will be going home right after the procedure, plan to have a  responsible adult care for you for the time you are told. This information is not intended to replace advice given to you by your health care provider. Make sure you discuss any questions you have with your health care provider. Document Revised: 11/06/2020 Document Reviewed: 11/06/2020 Elsevier Patient Education  [place].

## 2022-01-07 NOTE — H&P (Signed)
[facility] History and Physical  Reason for Referral: Port in place  Referring Physician:  Inda Coke, PA   Chief Complaint   Follow-up     Holly Phillips is a 66 y.o. female.  HPI: Holly Phillips is a 66 yo who I know from her prior breast surgery for cancer and port placement. She is doing well and has completed her treatment. She is ready to get her port removed. She reports no new issues or problems with her port.   She was seen by Oncology recently and referred back for removal.   Past Medical History:  Diagnosis Date   Anxiety    Arthritis    Asthma    Chronic back pain    Colon polyps    COPD (chronic obstructive pulmonary disease) ([place])    Depression    Emphysema of lung ([place])    Family history of breast cancer    GERD (gastroesophageal reflux disease)    Memory change    Port-A-Cath in place 08/01/2021   Sleep apnea    TIA (transient ischemic attack)    Tremor     Past Surgical History:  Procedure Laterality Date   ABDOMINAL HYSTERECTOMY     BREAST LUMPECTOMY WITH RADIOFREQUENCY TAG IDENTIFICATION Left 5/40/0867   Procedure: BREAST LUMPECTOMY WITH RADIOFREQUENCY TAG IDENTIFICATION;  Surgeon: Virl Cagey, MD;  Location: [facility];  Service: General;  Laterality: Left;   CERVICAL SPINE SURGERY     INNER EAR SURGERY     LUMBAR FUSION  2012   L3-L7   PARTIAL MASTECTOMY WITH AXILLARY SENTINEL LYMPH NODE BIOPSY Left 05/31/2021   Procedure: PARTIAL MASTECTOMY WITH AXILLARY SENTINEL LYMPH NODE BIOPSY;  Surgeon: Virl Cagey, MD;  Location: [facility];  Service: General;  Laterality: Left;   PORTACATH PLACEMENT Right 07/30/2021   Procedure: INSERTION PORT-A-CATH;  Surgeon: Virl Cagey, MD;  Location: [facility];  Service: General;  Laterality: Right;    Family History  Problem Relation Age of Onset   Depression Mother    Diabetes Mother    Hypertension Mother    Hyperlipidemia Mother    Heart attack Mother    Osteoarthritis  Father    Asthma Father    COPD Father    Breast cancer Sister        dx 67s-60s   Lupus Sister    Osteoarthritis Sister    Asthma Sister    COPD Sister    Diabetes Sister    Drug abuse Sister    Heart attack Sister    Heart attack Maternal Grandmother    Colon cancer Neg Hx    Esophageal cancer Neg Hx     Social History   Tobacco Use   Smoking status: Former    Packs/day: 0.50    Types: Cigarettes    Quit date: 08/17/2021    Years since quitting: 0.3   Smokeless tobacco: Never   Tobacco comments:    Used to smoke a pack a day, Not smoking cigarettes but is vaping  09/12/2021  Vaping Use   Vaping Use: Every day   Substances: Flavoring  Substance Use Topics   Alcohol use: Not Currently   Drug use: Not Currently    Types: Other-see comments    Comment: CBD and medical marijuana- denies 8/28/2,04/22/21    Medications: I have reviewed the patient's current medications. Allergies as of 01/07/2022       Reactions   Mucinex [guaifenesin Er] Other (See Comments)   Jerky movements  Medication List        Accurate as of January 07, 2022 10:04 AM. If you have any questions, ask your nurse or doctor.          albuterol (2.5 MG/3ML) 0.083% nebulizer solution Commonly known as: PROVENTIL Take 3 mLs (2.5 mg total) by nebulization every 6 (six) hours as needed for wheezing or shortness of breath. What changed: Another medication with the same name was changed. Make sure you understand how and when to take each.   albuterol 108 (90 Base) MCG/ACT inhaler Commonly known as: VENTOLIN HFA INHALE 1 TO 2 PUFFS INTO THE LUNGS EVERY 6 HOURS AS NEEDED FOR WHEEZING OR SHORTNESS OF BREATH What changed: See the new instructions.   atorvastatin 20 MG tablet Commonly known as: LIPITOR Take 1 tablet (20 mg total) by mouth daily.   budesonide-formoterol 160-4.5 MCG/ACT inhaler Commonly known as: SYMBICORT Inhale 2 puffs into the lungs 2 (two) times daily.   celecoxib 100 MG  capsule Commonly known as: CELEBREX Take 1 capsule (100 mg total) by mouth daily.   cyclobenzaprine 10 MG tablet Commonly known as: FLEXERIL Take 1 tablet (10 mg total) by mouth 3 (three) times daily.   diclofenac Sodium 1 % Gel Commonly known as: VOLTAREN SMARTSIG:Gram(s) Topical Twice Daily   furosemide 20 MG tablet Commonly known as: LASIX Take 1 tablet (20 mg total) by mouth daily as needed for fluid.   gabapentin 600 MG tablet Commonly known as: Neurontin Take 1 tablet in AM, 1 tablet at noon, and 2 tablets at bedtime.   lidocaine 5 % Commonly known as: LIDODERM Place 3 patches onto the skin daily as needed (pain).   montelukast 10 MG tablet Commonly known as: SINGULAIR Take 1 tablet (10 mg total) by mouth at bedtime.   naloxone 4 MG/0.1ML Liqd nasal spray kit Commonly known as: NARCAN   ondansetron 4 MG tablet Commonly known as: Zofran Take 1 tablet (4 mg total) by mouth every 8 (eight) hours as needed.   oxyCODONE-acetaminophen 10-325 MG tablet Commonly known as: PERCOCET Take 1 tablet by mouth 6 (six) times daily.   pantoprazole 40 MG tablet Commonly known as: PROTONIX Take 1 tablet (40 mg total) by mouth 2 (two) times daily.   sertraline 50 MG tablet Commonly known as: ZOLOFT Take 1 tablet (50 mg total) by mouth daily.   Spiriva Respimat 2.5 MCG/ACT Aers Generic drug: Tiotropium Bromide Monohydrate INHALE 2 PUFFS INTO THE LUNGS DAILY   tiZANidine 4 MG tablet Commonly known as: ZANAFLEX Take 1 tablet (4 mg total) by mouth 3 (three) times daily.   traZODone 100 MG tablet Commonly known as: DESYREL Take 2 tablets (200 mg total) by mouth at bedtime.         ROS:  A comprehensive review of systems was negative except for: port in place  Blood pressure (!) 94/55, pulse 94, temperature 98.2 F (36.8 C), temperature source Oral, resp. rate 16, height 4' 10.5" (1.486 m), weight 150 lb (68 kg), SpO2 90 %. Physical Exam Vitals reviewed.   Constitutional:      Appearance: Normal appearance.  HENT:     Head: Normocephalic.     Mouth/Throat:     Mouth: Mucous membranes are moist.  Eyes:     Extraocular Movements: Extraocular movements intact.  Cardiovascular:     Rate and Rhythm: Normal rate.  Pulmonary:     Breath sounds: Normal breath sounds.  Chest:     Comments: Right chest port in place, incision  healed  Abdominal:     General: There is no distension.     Palpations: Abdomen is soft.  Musculoskeletal:        General: No swelling.     Cervical back: Normal range of motion.  Skin:    General: Skin is warm.  Neurological:     General: No focal deficit present.     Mental Status: She is alert.  Psychiatric:        Mood and Affect: Mood normal.        Thought Content: Thought content normal.     Results: None   Assessment & Plan:  Alima Naser is a 65 y.o. female with a port in place after her breast cancer. Discussed removal under lidocaine. Discussed risk of bleeding, infection, and CXR after removal.   All questions were answered to the satisfaction of the patient.   Virl Cagey 01/07/2022, 10:04 AM

## 2022-01-08 NOTE — Progress Notes (Signed)
  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 She developed fatigue and anticipated skin changes in the treatment field.    Impression/Plan: 1. 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 The patient has been doing well since completion of radiotherapy. We discussed that we would be happy to continue to follow her as needed, but she will also continue to follow up with Dr. Delton Coombes in medical oncology. She was counseled on skin care as well as measures to avoid sun exposure to this area.         Carola Rhine, PAC

## 2022-01-09 ENCOUNTER — Other Ambulatory Visit (HOSPITAL_COMMUNITY): Payer: Self-pay | Admitting: Hematology

## 2022-01-09 ENCOUNTER — Inpatient Hospital Stay (HOSPITAL_BASED_OUTPATIENT_CLINIC_OR_DEPARTMENT_OTHER): Payer: Medicare Other | Admitting: Hematology

## 2022-01-09 VITALS — BP 103/59 | HR 93 | Temp 97.3°F | Resp 18 | Ht 58.5 in | Wt 154.5 lb

## 2022-01-09 DIAGNOSIS — C50412 Malignant neoplasm of upper-outer quadrant of left female breast: Secondary | ICD-10-CM

## 2022-01-09 DIAGNOSIS — Z170 Estrogen receptor positive status [ER+]: Secondary | ICD-10-CM

## 2022-01-09 MED ORDER — ANASTROZOLE 1 MG PO TABS: 1.0000 mg | ORAL_TABLET | Freq: Every day | ORAL | 6 refills | Status: DC

## 2022-01-09 NOTE — Patient Instructions (Signed)
[place] at [facility] Discharge Instructions   You were seen and examined today by Dr. Delton Coombes.  He reviewed the results of your lab work which are normal.   He sent a prescription to your pharmacy for an estrogen blocking pill called anastrozole. You will take one pill daily. You will take this for at least 5 years. It is to help prevent the cancer from coming back.   We will also plan to start you on an injection called Prolia. This is to help strengthen your bones as the anastrozole can cause bone weakening. And it was seen on a recent bone density that you have osteoporosis. This injection will help build back your bone strength.   We will see you back in 4 months. We will repeat lab work prior to your next visit.    Thank you for choosing [place] at [facility] to provide your oncology and hematology care.  To afford each patient quality time with our provider, please arrive at least 15 minutes before your scheduled appointment time.   If you have a lab appointment with the [facility] please come in thru the Main Entrance and check in at the main information desk.  You need to re-schedule your appointment should you arrive 10 or more minutes late.  We strive to give you quality time with our providers, and arriving late affects you and other patients whose appointments are after yours.  Also, if you no show three or more times for appointments you may be dismissed from the clinic at the providers discretion.     Again, thank you for choosing [facility].  Our hope is that these requests will decrease the amount of time that you wait before being seen by our physicians.       _____________________________________________________________  Should you have questions after your visit to [facility], please contact our office at [phone] and follow the prompts.  Our office hours are 8:00 a.m. and 4:30  p.m. Monday - Friday.  Please note that voicemails left after 4:00 p.m. may not be returned until the following business day.  We are closed weekends and major holidays.  You do have access to a nurse 24-7, just call the main number to the clinic [phone] and do not press any options, hold on the line and a nurse will answer the phone.    For prescription refill requests, have your pharmacy contact our office and allow 72 hours.    Due to Covid, you will need to wear a mask upon entering the hospital. If you do not have a mask, a mask will be given to you at the Main Entrance upon arrival. For doctor visits, patients may have 1 support person age 10 or older with them. For treatment visits, patients can not have anyone with them due to social distancing guidelines and our immunocompromised population.

## 2022-01-09 NOTE — Progress Notes (Signed)
Holly Phillips, [place] [address]   CLINIC:  Medical Oncology/Hematology  PCP:  Inda Coke, [address] / [place] [address] [phone]   REASON FOR VISIT:  Follow-up for stage I (T1BN0) left breast IDC, ER weakly positive  PRIOR THERAPY: none  NGS Results: not done  CURRENT THERAPY: Adjuvant chemotherapy with 4 cycles of TC followed by XRT plus AI  BRIEF ONCOLOGIC HISTORY:  Oncology History  Malignant neoplasm of upper-outer quadrant of left breast in female, estrogen receptor positive ([place])  05/14/2021 Initial Diagnosis   Malignant neoplasm of upper-outer quadrant of left breast in female, estrogen receptor positive ([place])   08/05/2021 - 10/09/2021 Chemotherapy   Patient is on Treatment Plan : BREAST TC q21d      Genetic Testing   Negative genetic testing. No pathogenic variants identified on the [facility] Multi-Cancer+RNA panel. The report date is 07/25/2021.  The Multi-Cancer Panel + RNA offered by [facility] includes sequencing and/or deletion duplication testing of the following 84 genes: AIP, ALK, APC, ATM, AXIN2,BAP1,  BARD1, BLM, BMPR1A, BRCA1, BRCA2, BRIP1, CASR, CDC73, CDH1, CDK4, CDKN1B, CDKN1C, CDKN2A (p14ARF), CDKN2A (p16INK4a), CEBPA, CHEK2, CTNNA1, DICER1, DIS3L2, EGFR (c.2369C>T, p.Thr790Met variant only), EPCAM (Deletion/duplication testing only), FH, FLCN, GATA2, GPC3, GREM1 (Promoter region deletion/duplication testing only), HOXB13 (c.251G>A, p.Gly84Glu), HRAS, KIT, MAX, MEN1, MET, MITF (c.952G>A, p.Glu318Lys variant only), MLH1, MSH2, MSH3, MSH6, MUTYH, NBN, NF1, NF2, NTHL1, PALB2, PDGFRA, PHOX2B, PMS2, POLD1, POLE, POT1, PRKAR1A, PTCH1, PTEN, RAD50, RAD51C, RAD51D, RB1, RECQL4, RET, RUNX1, SDHAF2, SDHA (sequence changes only), SDHB, SDHC, SDHD, SMAD4, SMARCA4, SMARCB1, SMARCE1, STK11, SUFU, TERC, TERT, TMEM127, TP53, TSC1, TSC2, VHL, WRN and WT1.     CANCER STAGING:  Cancer Staging  Malignant neoplasm of  upper-outer quadrant of left breast in female, estrogen receptor positive ([place]) Staging form: Breast, AJCC 8th Edition - Clinical stage from 07/01/2021: Stage IB (cT1b, cN0, cM0, G3, ER+, PR-, HER2-) - Unsigned   INTERVAL HISTORY:  Holly Phillips, a 66 y.o. female, returns for follow-up of breast cancer.  She has completed radiation therapy on 12/05/2021.  She reports energy levels of 85%.  Chronic back pain rated as 6 out of 10 is stable.   REVIEW OF SYSTEMS:  Review of Systems  Constitutional:  Negative for appetite change, fatigue and fever.  Respiratory:  Negative for shortness of breath.   Cardiovascular:  Negative for chest pain and palpitations.  Gastrointestinal:  Negative for nausea and vomiting.  Endocrine: Negative for hot flashes.  Genitourinary:  Negative for hematuria.   Musculoskeletal:  Negative for myalgias.  Neurological:  Negative for numbness.  Psychiatric/Behavioral:  Positive for sleep disturbance.   All other systems reviewed and are negative.   PAST MEDICAL/SURGICAL HISTORY:  Past Medical History:  Diagnosis Date   Anxiety    Arthritis    Asthma    Chronic back pain    Colon polyps    COPD (chronic obstructive pulmonary disease) ([facility])    Depression    Emphysema of lung ([place])    Family history of breast cancer    GERD (gastroesophageal reflux disease)    Memory change    Port-A-Cath in place 08/01/2021   Sleep apnea    TIA (transient ischemic attack)    Tremor    Past Surgical History:  Procedure Laterality Date   ABDOMINAL HYSTERECTOMY     BREAST LUMPECTOMY WITH RADIOFREQUENCY TAG IDENTIFICATION Left 2/44/0102   Procedure: BREAST LUMPECTOMY WITH RADIOFREQUENCY TAG IDENTIFICATION;  Surgeon: Constance Haw,  Lanell Matar, MD;  Location: [facility];  Service: General;  Laterality: Left;   CERVICAL SPINE SURGERY     INNER EAR SURGERY     LUMBAR FUSION  08/14/2010   L3-L7   PARTIAL MASTECTOMY WITH AXILLARY SENTINEL LYMPH NODE BIOPSY Left 05/31/2021   Procedure:  PARTIAL MASTECTOMY WITH AXILLARY SENTINEL LYMPH NODE BIOPSY;  Surgeon: Virl Cagey, MD;  Location: [facility];  Service: General;  Laterality: Left;   PORTACATH PLACEMENT Right 07/30/2021   Procedure: INSERTION PORT-A-CATH;  Surgeon: Virl Cagey, MD;  Location: [facility];  Service: General;  Laterality: Right;    SOCIAL HISTORY:  Social History   Socioeconomic History   Marital status: Widowed    Spouse name: Not on file   Number of children: 1   Years of education: 28   Highest education level: 11th grade  Occupational History   Not on file  Tobacco Use   Smoking status: Former    Packs/day: 0.50    Types: Cigarettes    Quit date: 08/17/2021    Years since quitting: 0.3   Smokeless tobacco: Never   Tobacco comments:    Used to smoke a pack a day, Not smoking cigarettes but is vaping  09/12/2021  Vaping Use   Vaping Use: Every day   Substances: Flavoring  Substance and Sexual Activity   Alcohol use: Not Currently   Drug use: Not Currently    Types: Other-see comments    Comment: CBD and medical marijuana- denies 8/28/2,04/22/21   Sexual activity: Not Currently  Other Topics Concern   Not on file  Social History Narrative   04/22/21 Lives alone   From [place], moved to [place] in Jul 17, 2019   Husband passed away in Aug 13, 2016   Social Determinants of Health   Financial Resource Strain: Not on file  Food Insecurity: Not on file  Transportation Needs: Not on file  Physical Activity: Not on file  Stress: Not on file  Social Connections: Not on file  Intimate Partner Violence: Not on file    FAMILY HISTORY:  Family History  Problem Relation Age of Onset   Depression Mother    Diabetes Mother    Hypertension Mother    Hyperlipidemia Mother    Heart attack Mother    Osteoarthritis Father    Asthma Father    COPD Father    Breast cancer Sister        dx 64s-60s   Lupus Sister    Osteoarthritis Sister    Asthma Sister    COPD Sister    Diabetes Sister    Drug  abuse Sister    Heart attack Sister    Heart attack Maternal Grandmother    Colon cancer Neg Hx    Esophageal cancer Neg Hx     CURRENT MEDICATIONS:  Current Outpatient Medications  Medication Sig Dispense Refill   albuterol (PROVENTIL) (2.5 MG/3ML) 0.083% nebulizer solution Take 3 mLs (2.5 mg total) by nebulization every 6 (six) hours as needed for wheezing or shortness of breath. 150 mL 1   albuterol (VENTOLIN HFA) 108 (90 Base) MCG/ACT inhaler INHALE 1 TO 2 PUFFS INTO THE LUNGS EVERY 6 HOURS AS NEEDED FOR WHEEZING OR SHORTNESS OF BREATH (Patient taking differently: 1-2 puffs every 6 (six) hours as needed for wheezing or shortness of breath.) 18 g 5   anastrozole (ARIMIDEX) 1 MG tablet Take 1 tablet (1 mg total) by mouth daily. 30 tablet 6   atorvastatin (LIPITOR) 20 MG tablet Take  1 tablet (20 mg total) by mouth daily. 90 tablet 3   budesonide-formoterol (SYMBICORT) 160-4.5 MCG/ACT inhaler Inhale 2 puffs into the lungs 2 (two) times daily. 10.2 g 11   celecoxib (CELEBREX) 100 MG capsule Take 1 capsule (100 mg total) by mouth daily. 90 capsule 1   cyclobenzaprine (FLEXERIL) 10 MG tablet Take 1 tablet (10 mg total) by mouth 3 (three) times daily. 90 tablet 1   diclofenac Sodium (VOLTAREN) 1 % GEL SMARTSIG:Gram(s) Topical Twice Daily     furosemide (LASIX) 20 MG tablet Take 1 tablet (20 mg total) by mouth daily as needed for fluid. 30 tablet 1   gabapentin (NEURONTIN) 600 MG tablet Take 1 tablet in AM, 1 tablet at noon, and 2 tablets at bedtime. 360 tablet 1   lidocaine (LIDODERM) 5 % Place 3 patches onto the skin daily as needed (pain).     montelukast (SINGULAIR) 10 MG tablet Take 1 tablet (10 mg total) by mouth at bedtime. 90 tablet 1   naloxone (NARCAN) nasal spray 4 mg/0.1 mL      ondansetron (ZOFRAN) 4 MG tablet Take 1 tablet (4 mg total) by mouth every 8 (eight) hours as needed. 30 tablet 1   oxyCODONE-acetaminophen (PERCOCET) 10-325 MG tablet Take 1 tablet by mouth 6 (six) times  daily.     pantoprazole (PROTONIX) 40 MG tablet Take 1 tablet (40 mg total) by mouth 2 (two) times daily. 180 tablet 0   sertraline (ZOLOFT) 50 MG tablet Take 1 tablet (50 mg total) by mouth daily. 90 tablet 1   SPIRIVA RESPIMAT 2.5 MCG/ACT AERS INHALE 2 PUFFS INTO THE LUNGS DAILY 4 g 3   tiZANidine (ZANAFLEX) 4 MG tablet Take 1 tablet (4 mg total) by mouth 3 (three) times daily. 270 tablet 1   traZODone (DESYREL) 100 MG tablet Take 2 tablets (200 mg total) by mouth at bedtime. 180 tablet 1   No current facility-administered medications for this visit.    ALLERGIES:  Allergies  Allergen Reactions   Mucinex [Guaifenesin Er] Other (See Comments)    Jerky movements    PHYSICAL EXAM:  Performance status (ECOG): 1 - Symptomatic but completely ambulatory  Vitals:   01/09/22 1129  BP: (!) 103/59  Pulse: 93  Resp: 18  Temp: (!) 97.3 F (36.3 C)  SpO2: 92%   Wt Readings from Last 3 Encounters:  01/09/22 154 lb 8 oz (70.1 kg)  01/07/22 150 lb (68 kg)  01/06/22 153 lb 8 oz (69.6 kg)   Physical Exam Vitals reviewed.  Constitutional:      Appearance: Normal appearance.  Cardiovascular:     Rate and Rhythm: Normal rate and regular rhythm.     Pulses: Normal pulses.     Heart sounds: Normal heart sounds.  Pulmonary:     Effort: Pulmonary effort is normal.     Breath sounds: Normal breath sounds.  Neurological:     General: No focal deficit present.     Mental Status: She is alert and oriented to person, place, and time.  Psychiatric:        Mood and Affect: Mood normal.        Behavior: Behavior normal.     LABORATORY DATA:  I have reviewed the labs as listed.     Latest Ref Rng & Units 01/02/2022   11:12 AM 10/07/2021    8:39 AM 09/16/2021    8:42 AM  CBC  WBC 4.0 - 10.5 K/uL 6.5  7.7  12.0  Hemoglobin 12.0 - 15.0 g/dL 12.0  9.3  9.5   Hematocrit 36.0 - 46.0 % 38.0  30.8  30.6   Platelets 150 - 400 K/uL 357  309  371       Latest Ref Rng & Units 01/02/2022   11:12  AM 10/07/2021    8:39 AM 09/16/2021    8:42 AM  CMP  Glucose 70 - 99 mg/dL 125  173  184   BUN 8 - 23 mg/dL $Remove'9  8  8   'dZHAULh$ Creatinine 0.44 - 1.00 mg/dL 0.75  0.61  0.58   Sodium 135 - 145 mmol/L 142  141  140   Potassium 3.5 - 5.1 mmol/L 3.7  3.3  3.6   Chloride 98 - 111 mmol/L 102  105  102   CO2 22 - 32 mmol/L 29  31  32   Calcium 8.9 - 10.3 mg/dL 9.4  8.4  8.5   Total Protein 6.5 - 8.1 g/dL 7.3  5.7  5.9   Total Bilirubin 0.3 - 1.2 mg/dL 0.3  0.5  0.3   Alkaline Phos 38 - 126 U/L 86  68  88   AST 15 - 41 U/L $Remo'24  15  20   'FJdiX$ ALT 0 - 44 U/L $Remo'20  11  16     'TIIps$ DIAGNOSTIC IMAGING:  I have independently reviewed the scans and discussed with the patient. No results found.   ASSESSMENT:  Stage I (T1BN0) left breast IDC, ER weakly positive: - She has been on estrogen supplements started at age 1 after TAH and BSO for endometriosis.  She continued estrogen into her 83s. - Left breast biopsy on 04/29/2021, IDC, Ki-67 30%, grade 3, ER-5% positive, PR negative, HER2 2+ by IHC and negative by FISH - Left lumpectomy and SLNB on 05/31/2021, 0/5 lymph nodes involved, 8 mm invasive ductal carcinoma, grade 3, margins negative.  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 - Oncotype DX recurrence score 60.  Distant recurrence rate at 9 years more than 39%.  Average absolute chemotherapy benefit more than 15%. - 4 cycles of dose attenuated TC from 08/05/2021 through 10/07/2021       -XRT to the left breast from 11/07/2021 through 12/05/2021       -Anastrozole started on 01/09/2022    Social/family history: - She is a retired Regulatory affairs officer. - Current active smoker, 1 pack/day for the last 43 years. - No family history of malignancies.   PLAN:  Stage I (T1b N0 M0) left breast cancer: - She has completed XRT on 12/05/2021. - We talked about initiating her on anastrozole 1 mg daily for at least 5 years and possibly longer.  We discussed side effects including hot flashes,  musculoskeletal symptoms, decreased bone mineral density.  We have sent prescription to her pharmacy. - I will reevaluate her in 4 months with repeat labs. - We will schedule her for bilateral diagnostic mammogram after 03/12/2022.  2.  Osteoporosis (DEXA 07/08/2021 with T score -2.6): - We discussed the possibility of worsening of osteoporosis from anastrozole.  I have recommended starting on Prolia every 6 months for the duration of AI therapy.  We discussed side effects in detail including hypocalcemia and rare chance of ONJ.  We will start her after insurance authorization obtained.  3.  Hypomagnesemia: - Magnesium is normal today.  Continue magnesium once daily.    Orders placed this encounter:  Orders Placed This Encounter  Procedures   MM DIAG  BREAST TOMO BILATERAL   CBC with Differential   Comprehensive metabolic panel   Magnesium   VITAMIN D 25 Hydroxy (Vit-D Deficiency, Fractures)      Derek Jack, MD [place] [phone]

## 2022-01-13 ENCOUNTER — Ambulatory Visit
Admission: RE | Admit: 2022-01-13 | Discharge: 2022-01-13 | Disposition: A | Discharge status: Home / Self Care | Payer: Medicare Other | Source: Ambulatory Visit | Attending: Radiation Oncology | Admitting: Radiation Oncology

## 2022-01-13 DIAGNOSIS — C50412 Malignant neoplasm of upper-outer quadrant of left female breast: Secondary | ICD-10-CM | POA: Insufficient documentation

## 2022-01-13 DIAGNOSIS — Z170 Estrogen receptor positive status [ER+]: Secondary | ICD-10-CM | POA: Insufficient documentation

## 2022-01-15 ENCOUNTER — Encounter (HOSPITAL_COMMUNITY)
Admission: RE | Disposition: A | Discharge status: Home / Self Care | Payer: Self-pay | Source: Ambulatory Visit | Attending: General Surgery

## 2022-01-15 ENCOUNTER — Encounter (HOSPITAL_COMMUNITY): Payer: Self-pay | Admitting: General Surgery

## 2022-01-15 ENCOUNTER — Ambulatory Visit (HOSPITAL_COMMUNITY): Payer: Medicare Other

## 2022-01-15 ENCOUNTER — Ambulatory Visit (HOSPITAL_COMMUNITY)
Admission: RE | Admit: 2022-01-15 | Discharge: 2022-01-15 | Disposition: A | Discharge status: Home / Self Care | Payer: Medicare Other | Source: Ambulatory Visit | Attending: General Surgery | Admitting: General Surgery

## 2022-01-15 DIAGNOSIS — C50919 Malignant neoplasm of unspecified site of unspecified female breast: Secondary | ICD-10-CM | POA: Diagnosis present

## 2022-01-15 DIAGNOSIS — Z95828 Presence of other vascular implants and grafts: Secondary | ICD-10-CM | POA: Diagnosis not present

## 2022-01-15 HISTORY — PX: PORT-A-CATH REMOVAL: SHX5289

## 2022-01-15 SURGERY — MINOR REMOVAL PORT-A-CATH
Anesthesia: LOCAL

## 2022-01-15 MED ORDER — LIDOCAINE HCL (PF) 1 % IJ SOLN
INTRAMUSCULAR | Status: AC
  Filled 2022-01-15: qty 30

## 2022-01-15 SURGICAL SUPPLY — 18 items
ADH SKN CLS APL DERMABOND .7 (GAUZE/BANDAGES/DRESSINGS) ×1
APL PRP STRL LF ISPRP CHG 10.5 (MISCELLANEOUS)
APPLICATOR CHLORAPREP 10.5 ORG (MISCELLANEOUS) IMPLANT
CLOTH BEACON ORANGE TIMEOUT ST (SAFETY) ×1 IMPLANT
DECANTER SPIKE VIAL GLASS SM (MISCELLANEOUS) ×1 IMPLANT
DERMABOND ADVANCED (GAUZE/BANDAGES/DRESSINGS) ×1
DERMABOND ADVANCED .7 DNX12 (GAUZE/BANDAGES/DRESSINGS) ×1 IMPLANT
DRAPE HALF SHEET 40X57 (DRAPES) IMPLANT
GLOVE BIO SURGEON STRL SZ 6.5 (GLOVE) ×1 IMPLANT
GLOVE BIOGEL PI IND STRL 6.5 (GLOVE) ×1 IMPLANT
GLOVE BIOGEL PI IND STRL 7.0 (GLOVE) ×2 IMPLANT
GLOVE BIOGEL PI INDICATOR 6.5 (GLOVE) ×1
GLOVE BIOGEL PI INDICATOR 7.0 (GLOVE) ×2
GOWN STRL REUS W/TWL LRG LVL3 (GOWN DISPOSABLE) IMPLANT
SPONGE GAUZE 2X2 8PLY STRL LF (GAUZE/BANDAGES/DRESSINGS) ×1 IMPLANT
SUT MNCRL AB 4-0 PS2 18 (SUTURE) ×1 IMPLANT
SUT VIC AB 3-0 SH 27 (SUTURE) ×1
SUT VIC AB 3-0 SH 27X BRD (SUTURE) ×1 IMPLANT

## 2022-01-15 NOTE — Progress Notes (Signed)
[facility]  Cxr with port removed. No obvious complications.  [name], MD [facility] [address] [place], [place] [address] [phone] (office)

## 2022-01-15 NOTE — Interval H&P Note (Signed)
History and Physical Interval Note:  01/15/2022 9:05 AM  Holly Phillips  has presented today for surgery, with the diagnosis of Port in place.  The various methods of treatment have been discussed with the patient and family. After consideration of risks, benefits and other options for treatment, the patient has consented to  Procedure(s): MINOR REMOVAL PORT-A-CATH (N/A) as a surgical intervention.  The patient's history has been reviewed, patient examined, no change in status, stable for surgery.  I have reviewed the patient's chart and labs.  Questions were answered to the patient's satisfaction.     Virl Cagey

## 2022-01-15 NOTE — Op Note (Signed)
[facility] Procedure Note  01/15/22  Pre-procedure Diagnosis: Port in place right internal jugular    Post-procedure Diagnosis: Same   Procedure(s) Performed: Port a catheter removal   Surgeon: Lanell Matar. Constance Haw, MD   Assistants: No qualified resident was available    Anesthesia: Lidocaine 1%    Specimens: None    Estimated Blood Loss: Minimal  Wound Class: Clean   Procedure Indications: Ms. Abdou is a 66 yo with a port in place after breast cancer. She has finished her treatment and we discussed removal. We discussed bleeding, infection, pneumothorax and getting a CXR to confirm removal after.   Findings: Normal appearing port, removed in its entirety    Procedure: The patient was taken to the procedure room and placed semi upright. The right chest and neck were prepared and draped in the usual sterile fashion. Lidocaine 1% was injected over the port site.  An incision was made and carried down to the capsule and the port was freed up with sharp dissection. The catheter was freed up with blunt dissection. The port and catheter were removed in their entirety and pressure was held on the IJ by my assistant for 10 minutes. The port cavity was irrigated and hemostasis was confirmed. 3-0 Vicryl closed the deep space and 4-0 Monocryl closed the skin and dermabond.    CXR was obtained to confirm removal and no complications.   Final inspection revealed acceptable hemostasis. The patient tolerated the procedure well.     [name], MD [facility] [address] [place], [place] [address] [phone] (office)

## 2022-01-16 ENCOUNTER — Inpatient Hospital Stay: Payer: Medicare Other

## 2022-01-17 ENCOUNTER — Encounter (HOSPITAL_COMMUNITY): Payer: Self-pay | Admitting: General Surgery

## 2022-02-09 ENCOUNTER — Other Ambulatory Visit (HOSPITAL_COMMUNITY): Payer: Self-pay | Admitting: Hematology

## 2022-02-09 DIAGNOSIS — Z95828 Presence of other vascular implants and grafts: Secondary | ICD-10-CM

## 2022-02-09 DIAGNOSIS — C50412 Malignant neoplasm of upper-outer quadrant of left female breast: Secondary | ICD-10-CM

## 2022-02-10 ENCOUNTER — Encounter (HOSPITAL_COMMUNITY): Payer: Self-pay | Admitting: Hematology

## 2022-03-04 ENCOUNTER — Other Ambulatory Visit: Payer: Self-pay | Admitting: Family Medicine

## 2022-03-05 ENCOUNTER — Other Ambulatory Visit: Payer: Self-pay | Admitting: Physician Assistant

## 2022-03-06 NOTE — Progress Notes (Signed)
Message sent to Dede Query, PA-C- Patient called stating that ever since she has had chemo last treatment looks like it was 10/09/21. She has been having sharp pain in her joints and can hardly walk on her feet. I made her aware that Dr. Raliegh Ip is out and that I would ask you since you are covering. She wants to know if the chemo could have caused this or if you think this may be something else.  Dede Query, PA-C response-  It is probably the anastrozole (antiestrogen) therapy. She can try to hold the medication for a few days and see if her pain improves? If it does improve, then she can call back and Dr. Raliegh Ip can determine if there needs to be any changes to her medication.   Patient is aware and agreeable with plan to hold anastrozole for a few days and call back to let us know if it is helping pain or not.

## 2022-03-11 ENCOUNTER — Encounter (HOSPITAL_COMMUNITY): Payer: Medicare Other

## 2022-03-12 ENCOUNTER — Other Ambulatory Visit: Payer: Self-pay | Admitting: Physician Assistant

## 2022-03-13 ENCOUNTER — Encounter: Payer: Self-pay | Admitting: *Deleted

## 2022-03-18 ENCOUNTER — Ambulatory Visit (HOSPITAL_COMMUNITY)
Admission: RE | Admit: 2022-03-18 | Discharge: 2022-03-18 | Disposition: A | Discharge status: Home / Self Care | Payer: Medicare Other | Source: Ambulatory Visit | Attending: Hematology | Admitting: Hematology

## 2022-03-18 DIAGNOSIS — Z170 Estrogen receptor positive status [ER+]: Secondary | ICD-10-CM

## 2022-03-18 DIAGNOSIS — C50412 Malignant neoplasm of upper-outer quadrant of left female breast: Secondary | ICD-10-CM | POA: Diagnosis present

## 2022-03-19 ENCOUNTER — Encounter: Payer: Self-pay | Admitting: Physician Assistant

## 2022-03-19 ENCOUNTER — Telehealth: Payer: Self-pay | Admitting: *Deleted

## 2022-03-19 ENCOUNTER — Ambulatory Visit (INDEPENDENT_AMBULATORY_CARE_PROVIDER_SITE_OTHER): Payer: Medicare Other | Admitting: Physician Assistant

## 2022-03-19 VITALS — BP 120/60 | HR 80 | Temp 97.8°F | Ht 58.5 in | Wt 158.4 lb

## 2022-03-19 DIAGNOSIS — R0602 Shortness of breath: Secondary | ICD-10-CM

## 2022-03-19 DIAGNOSIS — M791 Myalgia, unspecified site: Secondary | ICD-10-CM | POA: Diagnosis not present

## 2022-03-19 LAB — CBC WITH DIFFERENTIAL/PLATELET
Basophils Absolute: 0.1 10*3/uL (ref 0.0–0.1)
Basophils Relative: 0.9 % (ref 0.0–3.0)
Eosinophils Absolute: 0.3 10*3/uL (ref 0.0–0.7)
Eosinophils Relative: 3.2 % (ref 0.0–5.0)
HCT: 33.2 % — ABNORMAL LOW (ref 36.0–46.0)
Hemoglobin: 11 g/dL — ABNORMAL LOW (ref 12.0–15.0)
Lymphocytes Relative: 18.9 % (ref 12.0–46.0)
Lymphs Abs: 1.6 10*3/uL (ref 0.7–4.0)
MCHC: 33.2 g/dL (ref 30.0–36.0)
MCV: 93.3 fl (ref 78.0–100.0)
Monocytes Absolute: 0.6 10*3/uL (ref 0.1–1.0)
Monocytes Relative: 7.7 % (ref 3.0–12.0)
Neutro Abs: 5.8 10*3/uL (ref 1.4–7.7)
Neutrophils Relative %: 69.3 % (ref 43.0–77.0)
Platelets: 305 10*3/uL (ref 150.0–400.0)
RBC: 3.56 Mil/uL — ABNORMAL LOW (ref 3.87–5.11)
RDW: 13.4 % (ref 11.5–15.5)
WBC: 8.4 10*3/uL (ref 4.0–10.5)

## 2022-03-19 LAB — CK: Total CK: 44 U/L (ref 7–177)

## 2022-03-19 LAB — COMPREHENSIVE METABOLIC PANEL
ALT: 11 U/L (ref 0–35)
AST: 18 U/L (ref 0–37)
Albumin: 3.9 g/dL (ref 3.5–5.2)
Alkaline Phosphatase: 78 U/L (ref 39–117)
BUN: 13 mg/dL (ref 6–23)
CO2: 33 mEq/L — ABNORMAL HIGH (ref 19–32)
Calcium: 9.4 mg/dL (ref 8.4–10.5)
Chloride: 101 mEq/L (ref 96–112)
Creatinine, Ser: 0.68 mg/dL (ref 0.40–1.20)
GFR: 90.15 mL/min (ref >60.00)
Glucose, Bld: 126 mg/dL — ABNORMAL HIGH (ref 70–99)
Potassium: 4.1 mEq/L (ref 3.5–5.1)
Sodium: 139 mEq/L (ref 135–145)
Total Bilirubin: 0.4 mg/dL (ref 0.2–1.2)
Total Protein: 6.7 g/dL (ref 6.0–8.3)

## 2022-03-19 LAB — TSH: TSH: 0.66 u[IU]/mL (ref 0.35–5.50)

## 2022-03-19 NOTE — Telephone Encounter (Signed)
Pt called to let us know that her oxygen level was at 91 after wearing oxygen at 3L/min via nasal canula since she has been home. Samantha notified and said to tell pt to continue to wear oxygen and make sure you put your CPAP on tonight when you go to bed. Pt verbalized understanding. Also reminded pt to schedule appt with Pulmonary. Pt said she already did, is scheduled to see Pulmonary on 03/25/22. Told her okay, good.

## 2022-03-19 NOTE — Patient Instructions (Addendum)
It was great to see you!  Start using your CPAP!!!! Hold your cholesterol medication for two weeks. Keep a closer eye on your oxygen levels, I think you need it more often than you realize Call your lung doctor!!  Message me in two weeks about whether or not holding the cholesterol medication makes a difference in your symptoms.  Take care,  Inda Coke PA-C

## 2022-03-19 NOTE — Progress Notes (Signed)
Holly Phillips is a 66 y.o. female here for a new problem.  History of Present Illness:   Chief Complaint  Patient presents with   Joint Pain    Pt c/o joint pain all over, also bilateral leg pain and feet, has trouble walking    HPI  Joint pain Patient reports that all her joints hurt when she wakes up especially her feet in the morning. She states that her hands lock up from the pain and occasionally worsens throughout the day. The symptoms occurred after she completed her last radiation treatment on 12/05/2021. She saw her oncology provider, Dr. Delton Coombes, about this issue and was told to stop 1 mg Arimidex medication for breast cancer for a week. There were no changes in her symptoms after ceasing medicine, so it was restarted. Patient confirms that she drinks enough water and denies pain is due to radiation. She is complaint with the rest of her medications, 100 mg celebrex, 600 mg gabapentin, 10 mg flexeril, and 10-325 mg percocet.   SOB Patient reports experiencing SOB and has difficulty catching her breath when performing minimal tasks. When she is SOB, patient checks her oxygen level and it reads in the 90s. She states that she has oxygen at home, but only uses it when her oxygen level drops well under 90.  She has a CPAP and does not use this, states "I didn't think that I needed it anymore."  Past Medical History:  Diagnosis Date   Anxiety    Arthritis    Asthma    Chronic back pain    Colon polyps    COPD (chronic obstructive pulmonary disease) ([place])    Depression    Emphysema of lung ([place])    Family history of breast cancer    GERD (gastroesophageal reflux disease)    Memory change    Port-A-Cath in place 08/01/2021   Sleep apnea    TIA (transient ischemic attack)    Tremor      Social History   Tobacco Use   Smoking status: Former    Packs/day: 0.50    Types: Cigarettes    Quit date: 08/17/2021    Years since quitting: 0.5   Smokeless tobacco: Never    Tobacco comments:    Used to smoke a pack a day, Not smoking cigarettes but is vaping  09/12/2021  Vaping Use   Vaping Use: Every day   Substances: Flavoring  Substance Use Topics   Alcohol use: Not Currently   Drug use: Not Currently    Types: Other-see comments    Comment: CBD and medical marijuana- denies 8/28/2,04/22/21    Past Surgical History:  Procedure Laterality Date   ABDOMINAL HYSTERECTOMY     BREAST LUMPECTOMY WITH RADIOFREQUENCY TAG IDENTIFICATION Left 1/61/0960   Procedure: BREAST LUMPECTOMY WITH RADIOFREQUENCY TAG IDENTIFICATION;  Surgeon: Virl Cagey, MD;  Location: [facility];  Service: General;  Laterality: Left;   CERVICAL SPINE SURGERY     INNER EAR SURGERY     LUMBAR FUSION  2012   L3-L7   PARTIAL MASTECTOMY WITH AXILLARY SENTINEL LYMPH NODE BIOPSY Left 05/31/2021   Procedure: PARTIAL MASTECTOMY WITH AXILLARY SENTINEL LYMPH NODE BIOPSY;  Surgeon: Virl Cagey, MD;  Location: [facility];  Service: General;  Laterality: Left;   PORT-A-CATH REMOVAL N/A 01/15/2022   Procedure: MINOR REMOVAL PORT-A-CATH;  Surgeon: Virl Cagey, MD;  Location: [facility];  Service: General;  Laterality: N/A;   PORTACATH PLACEMENT Right 07/30/2021   Procedure: INSERTION PORT-A-CATH;  Surgeon: Virl Cagey, MD;  Location: [facility];  Service: General;  Laterality: Right;    Family History  Problem Relation Age of Onset   Depression Mother    Diabetes Mother    Hypertension Mother    Hyperlipidemia Mother    Heart attack Mother    Osteoarthritis Father    Asthma Father    COPD Father    Breast cancer Sister        dx 79s-60s   Lupus Sister    Osteoarthritis Sister    Asthma Sister    COPD Sister    Diabetes Sister    Drug abuse Sister    Heart attack Sister    Heart attack Maternal Grandmother    Colon cancer Neg Hx    Esophageal cancer Neg Hx     Allergies  Allergen Reactions   Mucinex [Guaifenesin Er] Other (See Comments)    Jerky movements    Current  Medications:   Current Outpatient Medications:    albuterol (PROVENTIL) (2.5 MG/3ML) 0.083% nebulizer solution, Take 3 mLs (2.5 mg total) by nebulization every 6 (six) hours as needed for wheezing or shortness of breath., Disp: 150 mL, Rfl: 1   albuterol (VENTOLIN HFA) 108 (90 Base) MCG/ACT inhaler, INHALE 1 TO 2 PUFFS INTO THE LUNGS EVERY 6 HOURS AS NEEDED FOR WHEEZING OR SHORTNESS OF BREATH (Patient taking differently: 1-2 puffs every 6 (six) hours as needed for wheezing or shortness of breath.), Disp: 18 g, Rfl: 5   anastrozole (ARIMIDEX) 1 MG tablet, Take 1 tablet (1 mg total) by mouth daily., Disp: 30 tablet, Rfl: 6   atorvastatin (LIPITOR) 20 MG tablet, Take 1 tablet (20 mg total) by mouth daily., Disp: 90 tablet, Rfl: 3   budesonide-formoterol (SYMBICORT) 160-4.5 MCG/ACT inhaler, Inhale 2 puffs into the lungs 2 (two) times daily., Disp: 10.2 g, Rfl: 11   celecoxib (CELEBREX) 100 MG capsule, TAKE 1 CAPSULE(100 MG) BY MOUTH TWICE DAILY, Disp: 180 capsule, Rfl: 0   cyclobenzaprine (FLEXERIL) 10 MG tablet, Take 1 tablet (10 mg total) by mouth 3 (three) times daily., Disp: 90 tablet, Rfl: 1   diclofenac Sodium (VOLTAREN) 1 % GEL, SMARTSIG:Gram(s) Topical Twice Daily, Disp: , Rfl:    furosemide (LASIX) 20 MG tablet, TAKE 1 TABLET(20 MG) BY MOUTH DAILY AS NEEDED FOR FLUID RETENTION, Disp: 30 tablet, Rfl: 1   gabapentin (NEURONTIN) 600 MG tablet, Take 1 tablet in AM, 1 tablet at noon, and 2 tablets at bedtime., Disp: 360 tablet, Rfl: 1   lidocaine (LIDODERM) 5 %, Place 3 patches onto the skin daily as needed (pain)., Disp: , Rfl:    montelukast (SINGULAIR) 10 MG tablet, Take 1 tablet (10 mg total) by mouth at bedtime., Disp: 90 tablet, Rfl: 1   naloxone (NARCAN) nasal spray 4 mg/0.1 mL, , Disp: , Rfl:    ondansetron (ZOFRAN) 4 MG tablet, Take 1 tablet (4 mg total) by mouth every 8 (eight) hours as needed., Disp: 30 tablet, Rfl: 1   oxyCODONE-acetaminophen (PERCOCET) 10-325 MG tablet, Take 1 tablet by  mouth 6 (six) times daily., Disp: , Rfl:    pantoprazole (PROTONIX) 40 MG tablet, Take 1 tablet (40 mg total) by mouth 2 (two) times daily., Disp: 180 tablet, Rfl: 0   prochlorperazine (COMPAZINE) 10 MG tablet, TAKE 1 TABLET(10 MG) BY MOUTH EVERY 6 HOURS AS NEEDED FOR NAUSEA OR VOMITING, Disp: 30 tablet, Rfl: 1   sertraline (ZOLOFT) 50 MG tablet, Take 1 tablet (50 mg total) by mouth daily.,  Disp: 90 tablet, Rfl: 1   SPIRIVA RESPIMAT 2.5 MCG/ACT AERS, INHALE 2 PUFFS INTO THE LUNGS DAILY, Disp: 4 g, Rfl: 3   tiZANidine (ZANAFLEX) 4 MG tablet, Take 1 tablet (4 mg total) by mouth 3 (three) times daily., Disp: 270 tablet, Rfl: 1   traZODone (DESYREL) 100 MG tablet, Take 2 tablets (200 mg total) by mouth at bedtime., Disp: 180 tablet, Rfl: 1   Review of Systems:   Review of Systems  Musculoskeletal:  Positive for joint pain.    Vitals:   Vitals:   03/19/22 0843  BP: 120/60  Pulse: 80  Temp: 97.8 F (36.6 C)  TempSrc: Temporal  Weight: 158 lb 6.1 oz (71.8 kg)  Height: 4' 10.5" (1.486 m)     Body mass index is 32.54 kg/m.  Physical Exam:   Physical Exam Constitutional:      General: She is not in acute distress.    Appearance: Normal appearance. She is not ill-appearing.  HENT:     Head: Normocephalic and atraumatic.     Right Ear: External ear normal.     Left Ear: External ear normal.  Eyes:     Extraocular Movements: Extraocular movements intact.     Pupils: Pupils are equal, round, and reactive to light.  Cardiovascular:     Rate and Rhythm: Normal rate and regular rhythm.     Heart sounds: Normal heart sounds. No murmur heard.    No gallop.  Pulmonary:     Effort: Pulmonary effort is normal. No respiratory distress.     Breath sounds: Normal breath sounds. No wheezing or rales.  Skin:    General: Skin is warm and dry.  Neurological:     Mental Status: She is alert and oriented to person, place, and time.  Psychiatric:        Judgment: Judgment normal.      Assessment and Plan:   Myalgia Suspect multifactorial -- related to arthritis/chronic pain, possibly her statin, and concern for intermittent hypoxia Recommend holding statin x 2 weeks to see if this makes a difference Recommend being very proactive to help with management of oxygen levels Update blood work today Consider referral to sports medicine if above efforts do not make any significant difference in her symptoms  SOB (shortness of breath) Oxygen level in office is in 70s -- she reports that this is normal at home per her readings Recommend close monitoring of oxygen at home Recommend follow-up with pulmonary and restart use of CPAP If worsening, recommend ER evaluation  I,Verona Buck,acting as a scribe for [employer], PA.,have documented all relevant documentation on the behalf of Inda Coke, PA,as directed by  Inda Coke, PA while in the presence of Inda Coke, [place].  I, Inda Coke, [place], have reviewed all documentation for this visit. The documentation on 03/19/22 for the exam, diagnosis, procedures, and orders are all accurate and complete.   Inda Coke, PA-C

## 2022-03-20 ENCOUNTER — Telehealth: Payer: Self-pay | Admitting: Physician Assistant

## 2022-03-20 NOTE — Telephone Encounter (Signed)
Patient is requesting a callback @ [phone] to discuss recent lab results. Wants to know if she needs to be concerned about anything.

## 2022-03-21 ENCOUNTER — Encounter (HOSPITAL_COMMUNITY): Payer: Self-pay | Admitting: General Surgery

## 2022-03-21 NOTE — Telephone Encounter (Signed)
Spoke to pt told her per Aldona Bar, Your blood work is overall stable. Nothing to explain your symptoms. Please continue to use your oxygen as needed as we discussed and keep me posted on your symptoms!  Pt verbalized understanding.

## 2022-03-25 ENCOUNTER — Ambulatory Visit (HOSPITAL_BASED_OUTPATIENT_CLINIC_OR_DEPARTMENT_OTHER): Payer: Medicare Other | Admitting: Pulmonary Disease

## 2022-03-25 ENCOUNTER — Other Ambulatory Visit (HOSPITAL_COMMUNITY): Payer: Self-pay | Admitting: Hematology

## 2022-03-25 DIAGNOSIS — Z95828 Presence of other vascular implants and grafts: Secondary | ICD-10-CM

## 2022-03-25 DIAGNOSIS — Z170 Estrogen receptor positive status [ER+]: Secondary | ICD-10-CM

## 2022-05-11 ENCOUNTER — Other Ambulatory Visit: Payer: Self-pay | Admitting: Physician Assistant

## 2022-05-20 ENCOUNTER — Inpatient Hospital Stay: Payer: Medicare Other | Attending: Hematology | Admitting: Hematology

## 2022-05-20 ENCOUNTER — Inpatient Hospital Stay: Payer: Medicare Other

## 2022-05-20 VITALS — BP 159/86 | HR 99 | Temp 97.7°F | Resp 16 | Wt 152.9 lb

## 2022-05-20 DIAGNOSIS — C50412 Malignant neoplasm of upper-outer quadrant of left female breast: Secondary | ICD-10-CM | POA: Diagnosis not present

## 2022-05-20 DIAGNOSIS — Z79811 Long term (current) use of aromatase inhibitors: Secondary | ICD-10-CM | POA: Insufficient documentation

## 2022-05-20 DIAGNOSIS — Z170 Estrogen receptor positive status [ER+]: Secondary | ICD-10-CM

## 2022-05-20 LAB — COMPREHENSIVE METABOLIC PANEL
ALT: 13 U/L (ref 0–44)
AST: 17 U/L (ref 15–41)
Albumin: 4 g/dL (ref 3.5–5.0)
Alkaline Phosphatase: 96 U/L (ref 38–126)
Anion gap: 11 (ref 5–15)
BUN: 14 mg/dL (ref 8–23)
CO2: 29 mmol/L (ref 22–32)
Calcium: 9.4 mg/dL (ref 8.9–10.3)
Chloride: 98 mmol/L (ref 98–111)
Creatinine, Ser: 0.6 mg/dL (ref 0.44–1.00)
GFR, Estimated: >60 mL/min (ref >60)
Glucose, Bld: 107 mg/dL — ABNORMAL HIGH (ref 70–99)
Potassium: 4.2 mmol/L (ref 3.5–5.1)
Sodium: 138 mmol/L (ref 135–145)
Total Bilirubin: 0.7 mg/dL (ref 0.3–1.2)
Total Protein: 7.3 g/dL (ref 6.5–8.1)

## 2022-05-20 LAB — CBC WITH DIFFERENTIAL/PLATELET
Abs Immature Granulocytes: 0.02 10*3/uL (ref 0.00–0.07)
Basophils Absolute: 0 10*3/uL (ref 0.0–0.1)
Basophils Relative: 0 %
Eosinophils Absolute: 0.2 10*3/uL (ref 0.0–0.5)
Eosinophils Relative: 2 %
HCT: 41.9 % (ref 36.0–46.0)
Hemoglobin: 13.1 g/dL (ref 12.0–15.0)
Immature Granulocytes: 0 %
Lymphocytes Relative: 34 %
Lymphs Abs: 2.9 10*3/uL (ref 0.7–4.0)
MCH: 30.7 pg (ref 26.0–34.0)
MCHC: 31.3 g/dL (ref 30.0–36.0)
MCV: 98.1 fL (ref 80.0–100.0)
Monocytes Absolute: 0.8 10*3/uL (ref 0.1–1.0)
Monocytes Relative: 9 %
Neutro Abs: 4.7 10*3/uL (ref 1.7–7.7)
Neutrophils Relative %: 55 %
Platelets: 331 10*3/uL (ref 150–400)
RBC: 4.27 MIL/uL (ref 3.87–5.11)
RDW: 12.4 % (ref 11.5–15.5)
WBC: 8.6 10*3/uL (ref 4.0–10.5)
nRBC: 0 % (ref 0.0–0.2)

## 2022-05-20 LAB — MAGNESIUM: Magnesium: 1.8 mg/dL (ref 1.7–2.4)

## 2022-05-20 LAB — VITAMIN D 25 HYDROXY (VIT D DEFICIENCY, FRACTURES): Vit D, 25-Hydroxy: 53.38 ng/mL (ref 30–100)

## 2022-05-20 NOTE — Patient Instructions (Addendum)
[place] [facility] Discharge Instructions   You were seen and examined today by Dr. Delton Coombes.  He reviewed the results of your lab work which is normal.   He reviewed the results of you mammogram which was normal.   Continue anastrozole as prescribed.   Return as scheduled.    Thank you for choosing [place] at [facility] to provide your oncology and hematology care.  To afford each patient quality time with our provider, please arrive at least 15 minutes before your scheduled appointment time.   If you have a lab appointment with the [facility] please come in thru the Main Entrance and check in at the main information desk.  You need to re-schedule your appointment should you arrive 10 or more minutes late.  We strive to give you quality time with our providers, and arriving late affects you and other patients whose appointments are after yours.  Also, if you no show three or more times for appointments you may be dismissed from the clinic at the providers discretion.     Again, thank you for choosing [facility].  Our hope is that these requests will decrease the amount of time that you wait before being seen by our physicians.       _____________________________________________________________  Should you have questions after your visit to [facility], please contact our office at [phone] and follow the prompts.  Our office hours are 8:00 a.m. and 4:30 p.m. Monday - Friday.  Please note that voicemails left after 4:00 p.m. may not be returned until the following business day.  We are closed weekends and major holidays.  You do have access to a nurse 24-7, just call the main number to the clinic [phone] and do not press any options, hold on the line and a nurse will answer the phone.    For prescription refill requests, have your pharmacy contact our office and allow 72 hours.    Due to  Covid, you will need to wear a mask upon entering the hospital. If you do not have a mask, a mask will be given to you at the Main Entrance upon arrival. For doctor visits, patients may have 1 support person age 7 or older with them. For treatment visits, patients can not have anyone with them due to social distancing guidelines and our immunocompromised population.

## 2022-05-20 NOTE — Progress Notes (Signed)
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 No pathogenic variants identified on the [facility] Multi-Cancer+RNA panel. The report date is 07/25/2021.  The Multi-Cancer Panel + RNA offered by [facility] includes sequencing and/or deletion duplication testing of the following 84 genes: AIP, ALK, APC, ATM, AXIN2,BAP1,  BARD1, BLM, BMPR1A, BRCA1, BRCA2, BRIP1, CASR, CDC73, CDH1, CDK4, CDKN1B, CDKN1C, CDKN2A (p14ARF), CDKN2A (p16INK4a), CEBPA, CHEK2, CTNNA1, DICER1, DIS3L2, EGFR (c.2369C>T, p.Thr790Met variant only), EPCAM (Deletion/duplication testing only), FH, FLCN, GATA2, GPC3, GREM1 (Promoter region deletion/duplication testing only), HOXB13 (c.251G>A, p.Gly84Glu), HRAS, KIT, MAX, MEN1, MET, MITF (c.952G>A, p.Glu318Lys variant only), MLH1, MSH2, MSH3, MSH6, MUTYH, NBN, NF1, NF2, NTHL1, PALB2, PDGFRA, PHOX2B, PMS2, POLD1, POLE, POT1, PRKAR1A, PTCH1, PTEN, RAD50, RAD51C, RAD51D, RB1, RECQL4, RET, RUNX1, SDHAF2, SDHA (sequence changes only), SDHB, SDHC, SDHD, SMAD4, SMARCA4, SMARCB1, SMARCE1, STK11, SUFU, TERC, TERT, TMEM127, TP53, TSC1, TSC2, VHL, WRN and WT1.     CANCER STAGING:  Cancer Staging  Malignant neoplasm of  upper-outer quadrant of left breast in female, estrogen receptor positive (Holly Phillips) Staging form: Breast, AJCC 8th Edition - Clinical stage from 07/01/2021: Stage IB (cT1b, cN0, cM0, G3, ER+, PR-, HER2-) - Unsigned   INTERVAL HISTORY:  [name], a 67 y.o. female, seen for follow-up of her breast cancer.  She is tolerating anastrozole reasonably well.  She reports pain in her feet after walking.  She reports this pain has gotten noticeable since her gabapentin was cut back.  REVIEW OF SYSTEMS:  Review of Systems  Constitutional:  Negative for appetite change, fatigue and fever.  Respiratory:  Negative for shortness of breath.   Cardiovascular:  Negative for chest pain and palpitations.  Gastrointestinal:  Negative for nausea and vomiting.  Endocrine: Negative for hot flashes.  Genitourinary:  Negative for hematuria.   Musculoskeletal:  Negative for myalgias.  Neurological:  Negative for numbness.  Psychiatric/Behavioral:  Positive for sleep disturbance. The patient is nervous/anxious.   All other systems reviewed and are negative.   PAST MEDICAL/SURGICAL HISTORY:  Past Medical History:  Diagnosis Date   Anxiety    Arthritis    Asthma    Chronic back pain    Colon polyps    COPD (chronic obstructive pulmonary disease) (HCC)    Depression    Emphysema of lung ([facility])    Family history of breast cancer    GERD (gastroesophageal reflux disease)    Memory change    Port-A-Cath in place 08/01/2021   Sleep apnea    TIA (transient ischemic attack)    Tremor    Past Surgical History:  Procedure Laterality Date   ABDOMINAL HYSTERECTOMY     BREAST LUMPECTOMY WITH RADIOFREQUENCY TAG IDENTIFICATION Left 4/62/8638   Procedure: BREAST LUMPECTOMY  WITH RADIOFREQUENCY TAG IDENTIFICATION;  Surgeon: Virl Cagey, MD;  Location: [facility];  Service: General;  Laterality: Left;   CERVICAL SPINE SURGERY     INNER EAR SURGERY     LUMBAR FUSION  Jul 06, 2010   L3-L7   PARTIAL MASTECTOMY  WITH AXILLARY SENTINEL LYMPH NODE BIOPSY Left 05/31/2021   Procedure: PARTIAL MASTECTOMY WITH AXILLARY SENTINEL LYMPH NODE BIOPSY;  Surgeon: Virl Cagey, MD;  Location: [facility];  Service: General;  Laterality: Left;   PORT-A-CATH REMOVAL N/A 01/15/2022   Procedure: MINOR REMOVAL PORT-A-CATH;  Surgeon: Virl Cagey, MD;  Location: [facility];  Service: General;  Laterality: N/A;   PORTACATH PLACEMENT Right 07/30/2021   Procedure: INSERTION PORT-A-CATH;  Surgeon: Virl Cagey, MD;  Location: [facility];  Service: General;  Laterality: Right;    SOCIAL HISTORY:  Social History   Socioeconomic History   Marital status: Widowed    Spouse name: Not on file   Number of children: 1   Years of education: 76   Highest education level: 11th grade  Occupational History   Not on file  Tobacco Use   Smoking status: Former    Packs/day: 0.50    Types: Cigarettes    Quit date: 08/17/2021    Years since quitting: 0.7   Smokeless tobacco: Never   Tobacco comments:    Used to smoke a pack a day, Not smoking cigarettes but is vaping  09/12/2021  Vaping Use   Vaping Use: Every day   Substances: Flavoring  Substance and Sexual Activity   Alcohol use: Not Currently   Drug use: Not Currently    Types: Other-see comments    Comment: CBD and medical marijuana- denies 8/28/2,04/22/21   Sexual activity: Not Currently  Other Topics Concern   Not on file  Social History Narrative   04/22/21 Lives alone   From [place], moved to [place] in 07/07/2019   Husband passed away in 2016/07/06   Social Determinants of Health   Financial Resource Strain: Not on file  Food Insecurity: Not on file  Transportation Needs: Not on file  Physical Activity: Not on file  Stress: Not on file  Social Connections: Not on file  Intimate Partner Violence: Not on file    FAMILY HISTORY:  Family History  Problem Relation Age of Onset   Depression Mother    Diabetes Mother    Hypertension Mother    Hyperlipidemia  Mother    Heart attack Mother    Osteoarthritis Father    Asthma Father    COPD Father    Breast cancer Sister        dx 72s-60s   Lupus Sister    Osteoarthritis Sister    Asthma Sister    COPD Sister    Diabetes Sister    Drug abuse Sister    Heart attack Sister    Heart attack Maternal Grandmother    Colon cancer Neg Hx    Esophageal cancer Neg Hx     CURRENT MEDICATIONS:  Current Outpatient Medications  Medication Sig Dispense Refill   albuterol (PROVENTIL) (2.5 MG/3ML) 0.083% nebulizer solution Take 3 mLs (2.5 mg total) by nebulization every 6 (six) hours as needed for wheezing or shortness of breath. 150 mL 1   albuterol (VENTOLIN HFA) 108 (90 Base) MCG/ACT inhaler INHALE 1 TO 2 PUFFS INTO THE LUNGS EVERY 6 HOURS AS NEEDED FOR WHEEZING OR SHORTNESS OF BREATH 17 g 3   anastrozole (ARIMIDEX) 1 MG tablet Take 1  tablet (1 mg total) by mouth daily. 30 tablet 6   atorvastatin (LIPITOR) 20 MG tablet Take 1 tablet (20 mg total) by mouth daily. 90 tablet 3   budesonide-formoterol (SYMBICORT) 160-4.5 MCG/ACT inhaler Inhale 2 puffs into the lungs 2 (two) times daily. 10.2 g 11   celecoxib (CELEBREX) 100 MG capsule TAKE 1 CAPSULE(100 MG) BY MOUTH TWICE DAILY 180 capsule 0   cyclobenzaprine (FLEXERIL) 10 MG tablet Take 1 tablet (10 mg total) by mouth 3 (three) times daily. 90 tablet 1   diclofenac Sodium (VOLTAREN) 1 % GEL SMARTSIG:Gram(s) Topical Twice Daily     furosemide (LASIX) 20 MG tablet TAKE 1 TABLET(20 MG) BY MOUTH DAILY AS NEEDED FOR FLUID RETENTION 30 tablet 1   gabapentin (NEURONTIN) 600 MG tablet Take 1 tablet in AM, 1 tablet at noon, and 2 tablets at bedtime. 360 tablet 1   lidocaine (LIDODERM) 5 % Place 3 patches onto the skin daily as needed (pain).     montelukast (SINGULAIR) 10 MG tablet Take 1 tablet (10 mg total) by mouth at bedtime. 90 tablet 1   naloxone (NARCAN) nasal spray 4 mg/0.1 mL      oxyCODONE-acetaminophen (PERCOCET) 10-325 MG tablet Take 1 tablet by mouth 6  (six) times daily.     pantoprazole (PROTONIX) 40 MG tablet Take 1 tablet (40 mg total) by mouth 2 (two) times daily. 180 tablet 0   sertraline (ZOLOFT) 50 MG tablet Take 1 tablet (50 mg total) by mouth daily. 90 tablet 1   SPIRIVA RESPIMAT 2.5 MCG/ACT AERS INHALE 2 PUFFS INTO THE LUNGS DAILY 4 g 3   tiZANidine (ZANAFLEX) 4 MG tablet Take 1 tablet (4 mg total) by mouth 3 (three) times daily. 270 tablet 1   traZODone (DESYREL) 100 MG tablet Take 2 tablets (200 mg total) by mouth at bedtime. 180 tablet 1   ondansetron (ZOFRAN) 4 MG tablet Take 1 tablet (4 mg total) by mouth every 8 (eight) hours as needed. (Patient not taking: Reported on 05/20/2022) 30 tablet 1   prochlorperazine (COMPAZINE) 10 MG tablet TAKE 1 TABLET(10 MG) BY MOUTH EVERY 6 HOURS AS NEEDED FOR NAUSEA OR VOMITING (Patient not taking: Reported on 05/20/2022) 30 tablet 1   No current facility-administered medications for this visit.    ALLERGIES:  Allergies  Allergen Reactions   Mucinex [Guaifenesin Er] Other (See Comments)    Jerky movements    PHYSICAL EXAM:  Performance status (ECOG): 1 - Symptomatic but completely ambulatory  Vitals:   05/20/22 1201  BP: (!) 159/86  Pulse: 99  Resp: 16  Temp: 97.7 F (36.5 C)  SpO2: 97%   Wt Readings from Last 3 Encounters:  05/20/22 152 lb 14.4 oz (69.4 kg)  03/19/22 158 lb 6.1 oz (71.8 kg)  01/09/22 154 lb 8 oz (70.1 kg)   Physical Exam Vitals reviewed.  Constitutional:      Appearance: Normal appearance.  Cardiovascular:     Rate and Rhythm: Normal rate and regular rhythm.     Pulses: Normal pulses.     Heart sounds: Normal heart sounds.  Pulmonary:     Effort: Pulmonary effort is normal.     Breath sounds: Normal breath sounds.  Neurological:     General: No focal deficit present.     Mental Status: She is alert and oriented to person, place, and time.  Psychiatric:        Mood and Affect: Mood normal.        Behavior: Behavior normal.  LABORATORY DATA:  I  have reviewed the labs as listed.     Latest Ref Rng & Units 05/20/2022   10:50 AM 03/19/2022    9:26 AM 01/02/2022   11:12 AM  CBC  WBC 4.0 - 10.5 K/uL 8.6  8.4  6.5   Hemoglobin 12.0 - 15.0 g/dL 13.1  11.0  12.0   Hematocrit 36.0 - 46.0 % 41.9  33.2  38.0   Platelets 150 - 400 K/uL 331  305.0  357       Latest Ref Rng & Units 05/20/2022   10:50 AM 03/19/2022    9:26 AM 01/02/2022   11:12 AM  CMP  Glucose 70 - 99 mg/dL 107  126  125   BUN 8 - 23 mg/dL _0 Creatinine 0.44 - 1.00 mg/dL 0.60  0.68  0.75   Sodium 135 - 145 mmol/L 138  139  142   Potassium 3.5 - 5.1 mmol/L 4.2  4.1  3.7   Chloride 98 - 111 mmol/L 98  101  102   CO2 22 - 32 mmol/L 29  33  29   Calcium 8.9 - 10.3 mg/dL 9.4  9.4  9.4   Total Protein 6.5 - 8.1 g/dL 7.3  6.7  7.3   Total Bilirubin 0.3 - 1.2 mg/dL 0.7  0.4  0.3   Alkaline Phos 38 - 126 U/L 96  78  86   AST 15 - 41 U/L _1 ALT 0 - 44 U/L _2 DIAGNOSTIC IMAGING:  I have independently reviewed the scans and discussed with the patient. No results found.   ASSESSMENT:  Stage I (T1BN0) left breast IDC, ER weakly positive: - She has been on estrogen supplements started at age 46 after TAH and BSO for endometriosis.  She continued estrogen into her 85s. - Left breast biopsy on 04/29/2021, IDC, Ki-67 30%, grade 3, ER-5% positive, PR negative, HER2 2+ by IHC and negative by FISH - Left lumpectomy and SLNB on 05/31/2021, 0/5 lymph nodes involved, 8 mm invasive ductal carcinoma, grade 3, margins negative.  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 - Oncotype DX recurrence score 60.  Distant recurrence rate at 9 years more than 39%.  Average absolute chemotherapy benefit more than 15%. - 4 cycles of dose attenuated TC from 08/05/2021 through 10/07/2021       -XRT to the left breast from 11/07/2021 through 12/05/2021       -Anastrozole started on 01/09/2022    Social/family history: - She is a retired  Regulatory affairs officer. - Current active smoker, 1 pack/day for the last 43 years. - No family history of malignancies.   PLAN:  Stage I (T1b N0 M0) left breast cancer: -She is tolerating anastrozole very well.  No vasomotor symptoms or musculoskeletal symptoms. - Mammogram on 03/18/2022 was BI-RADS Category 2 reviewed by me. - Labs today shows normal LFTs and CBC. - She is planning to move to [place] later this summer.  We will find a local oncologist there. - She reports walking causes pain in her feet.  I have recommended to her that she talk to her PMD, consider increasing gabapentin to 600 mg 3 times daily.  She is currently taking 300 mg in the morning, afternoon and 600 mg at bedtime.  2.  Osteoporosis (DEXA 07/08/2021 with T score -2.6): - I have recommended Prolia every  6 months.  Her insurance would not cover it. - Will change to zoledronic acid every 12 months.  3.  Hypomagnesemia: - Continue magnesium daily.  Magnesium is normal.    Orders placed this encounter:  Orders Placed This Encounter  Procedures   CBC with Differential   Comprehensive metabolic panel   VITAMIN D 25 Hydroxy (Vit-D Deficiency, Fractures)      Derek Jack, MD [facility] [phone]

## 2022-05-21 ENCOUNTER — Other Ambulatory Visit: Payer: Self-pay | Admitting: Physician Assistant

## 2022-05-21 ENCOUNTER — Other Ambulatory Visit: Payer: Self-pay

## 2022-05-22 ENCOUNTER — Other Ambulatory Visit: Payer: Self-pay | Admitting: Nurse Practitioner

## 2022-05-22 ENCOUNTER — Ambulatory Visit
Admission: RE | Admit: 2022-05-22 | Discharge: 2022-05-22 | Disposition: A | Discharge status: Home / Self Care | Payer: Medicare Other | Source: Ambulatory Visit | Attending: Nurse Practitioner | Admitting: Nurse Practitioner

## 2022-05-22 DIAGNOSIS — M47816 Spondylosis without myelopathy or radiculopathy, lumbar region: Secondary | ICD-10-CM | POA: Diagnosis not present

## 2022-05-22 DIAGNOSIS — M533 Sacrococcygeal disorders, not elsewhere classified: Secondary | ICD-10-CM

## 2022-05-22 DIAGNOSIS — G894 Chronic pain syndrome: Secondary | ICD-10-CM | POA: Diagnosis not present

## 2022-05-22 DIAGNOSIS — Z79891 Long term (current) use of opiate analgesic: Secondary | ICD-10-CM | POA: Diagnosis not present

## 2022-05-22 DIAGNOSIS — M419 Scoliosis, unspecified: Secondary | ICD-10-CM | POA: Diagnosis not present

## 2022-05-22 DIAGNOSIS — M545 Low back pain, unspecified: Secondary | ICD-10-CM

## 2022-05-27 ENCOUNTER — Other Ambulatory Visit (HOSPITAL_COMMUNITY): Payer: Self-pay | Admitting: Hematology

## 2022-05-28 ENCOUNTER — Encounter (HOSPITAL_COMMUNITY): Payer: Self-pay | Admitting: Hematology

## 2022-06-01 ENCOUNTER — Other Ambulatory Visit: Payer: Self-pay | Admitting: Physician Assistant

## 2022-06-02 NOTE — Telephone Encounter (Signed)
Pt requesting refills. Last OV 03/2022.

## 2022-06-03 ENCOUNTER — Inpatient Hospital Stay: Payer: Medicare Other

## 2022-06-03 VITALS — BP 150/84 | HR 74 | Temp 97.9°F | Resp 18 | Wt 148.6 lb

## 2022-06-03 DIAGNOSIS — M810 Age-related osteoporosis without current pathological fracture: Secondary | ICD-10-CM

## 2022-06-03 DIAGNOSIS — Z170 Estrogen receptor positive status [ER+]: Secondary | ICD-10-CM

## 2022-06-03 DIAGNOSIS — C50412 Malignant neoplasm of upper-outer quadrant of left female breast: Secondary | ICD-10-CM

## 2022-06-03 DIAGNOSIS — Z79811 Long term (current) use of aromatase inhibitors: Secondary | ICD-10-CM | POA: Diagnosis not present

## 2022-06-03 LAB — CBC WITH DIFFERENTIAL/PLATELET
Abs Immature Granulocytes: 0.03 10*3/uL (ref 0.00–0.07)
Basophils Absolute: 0 10*3/uL (ref 0.0–0.1)
Basophils Relative: 0 %
Eosinophils Absolute: 0 10*3/uL (ref 0.0–0.5)
Eosinophils Relative: 0 %
HCT: 44.4 % (ref 36.0–46.0)
Hemoglobin: 13.9 g/dL (ref 12.0–15.0)
Immature Granulocytes: 0 %
Lymphocytes Relative: 25 %
Lymphs Abs: 2.3 10*3/uL (ref 0.7–4.0)
MCH: 30.9 pg (ref 26.0–34.0)
MCHC: 31.3 g/dL (ref 30.0–36.0)
MCV: 98.7 fL (ref 80.0–100.0)
Monocytes Absolute: 0.7 10*3/uL (ref 0.1–1.0)
Monocytes Relative: 8 %
Neutro Abs: 6.3 10*3/uL (ref 1.7–7.7)
Neutrophils Relative %: 67 %
Platelets: 366 10*3/uL (ref 150–400)
RBC: 4.5 MIL/uL (ref 3.87–5.11)
RDW: 12.2 % (ref 11.5–15.5)
WBC: 9.4 10*3/uL (ref 4.0–10.5)
nRBC: 0 % (ref 0.0–0.2)

## 2022-06-03 LAB — COMPREHENSIVE METABOLIC PANEL
ALT: 16 U/L (ref 0–44)
AST: 21 U/L (ref 15–41)
Albumin: 4.3 g/dL (ref 3.5–5.0)
Alkaline Phosphatase: 100 U/L (ref 38–126)
Anion gap: 11 (ref 5–15)
BUN: 13 mg/dL (ref 8–23)
CO2: 29 mmol/L (ref 22–32)
Calcium: 9.7 mg/dL (ref 8.9–10.3)
Chloride: 100 mmol/L (ref 98–111)
Creatinine, Ser: 0.77 mg/dL (ref 0.44–1.00)
GFR, Estimated: >60 mL/min (ref >60)
Glucose, Bld: 108 mg/dL — ABNORMAL HIGH (ref 70–99)
Potassium: 3.3 mmol/L — ABNORMAL LOW (ref 3.5–5.1)
Sodium: 140 mmol/L (ref 135–145)
Total Bilirubin: 0.4 mg/dL (ref 0.3–1.2)
Total Protein: 7.7 g/dL (ref 6.5–8.1)

## 2022-06-03 LAB — VITAMIN D 25 HYDROXY (VIT D DEFICIENCY, FRACTURES): Vit D, 25-Hydroxy: 53.63 ng/mL (ref 30–100)

## 2022-06-03 MED ORDER — ZOLEDRONIC ACID 5 MG/100ML IV SOLN
5.0000 mg | Freq: Once | INTRAVENOUS | Status: AC
  Administered 2022-06-03: 5 mg via INTRAVENOUS
  Filled 2022-06-03: qty 100

## 2022-06-03 MED ORDER — SODIUM CHLORIDE 0.9 % IV SOLN: Freq: Once | INTRAVENOUS | Status: AC

## 2022-06-03 NOTE — Progress Notes (Signed)
Patient presents today for Reclast infusion.  Patient is in satisfactory condition with no complaints voiced.  Vital signs are stable.  Labs reviewed and all labs are within treatment parameters.  We will proceed with infusion per MD orders.   Patient tolerated infusion well with no complaints voiced.  Patient left ambulatory in stable condition.  Vital signs stable at discharge.  Follow up as scheduled.

## 2022-06-03 NOTE — Patient Instructions (Signed)
[facility]  Discharge Instructions: Thank you for choosing [facility] to provide your oncology and hematology care.  If you have a lab appointment with the [facility], please come in thru the Main Entrance and check in at the main information desk.  Wear comfortable clothing and clothing appropriate for easy access to any Portacath or PICC line.   We strive to give you quality time with your provider. You may need to reschedule your appointment if you arrive late (15 or more minutes).  Arriving late affects you and other patients whose appointments are after yours.  Also, if you miss three or more appointments without notifying the office, you may be dismissed from the clinic at the provider's discretion.      For prescription refill requests, have your pharmacy contact our office and allow 72 hours for refills to be completed.    Zoledronic Acid Injection (Bone Disorders) What is this medication? ZOLEDRONIC ACID (ZOE le dron ik AS id) prevents and treats osteoporosis. It may also be used to treat Paget's disease of the bone. It works by Paramedic stronger and less likely to break (fracture). It belongs to a group of medications called bisphosphonates. This medicine may be used for other purposes; ask your health care provider or pharmacist if you have questions. COMMON BRAND NAME(S): Reclast What should I tell my care team before I take this medication? They need to know if you have any of these conditions: Bleeding disorder Cancer Dental disease Kidney disease Low levels of calcium in the blood Low red blood cell counts Lung or breathing disease, such as asthma Receiving steroids, such as dexamethasone or prednisone An unusual or allergic reaction to zoledronic acid, other medications, foods, dyes, or preservatives Pregnant or trying to get pregnant Breast-feeding How should I use this medication? This medication is injected into a vein.  It is given by your care team in a hospital or clinic setting. A special MedGuide will be given to you before each treatment. Be sure to read this information carefully each time. Talk to your care team about the use of this medication in children. Special care may be needed. Overdosage: If you think you have taken too much of this medicine contact a poison control center or emergency room at once. NOTE: This medicine is only for you. Do not share this medicine with others. What if I miss a dose? Keep appointments for follow-up doses. It is important not to miss your dose. Call your care team if you are unable to keep an appointment. What may interact with this medication? Certain antibiotics given by injection Medications for pain and inflammation, such as ibuprofen, naproxen, NSAIDs Some diuretics, such as bumetanide, furosemide Teriparatide This list may not describe all possible interactions. Give your health care provider a list of all the medicines, herbs, non-prescription drugs, or dietary supplements you use. Also tell them if you smoke, drink alcohol, or use illegal drugs. Some items may interact with your medicine. What should I watch for while using this medication? Visit your care team for regular checks on your progress. It may be some time before you see the benefit from this medication. Some people who take this medication have severe bone, joint, or muscle pain. This medication may also increase your risk for jaw problems or a broken thigh bone. Tell your care team right away if you have severe pain in your jaw, bones, joints, or muscles. Tell your care team if  you have any pain that does not go away or that gets worse. You should make sure you get enough calcium and vitamin D while you are taking this medication. Discuss the foods you eat and the vitamins you take with your care team. You may need bloodwork while taking this medication. Tell your dentist and dental surgeon that you  are taking this medication. You should not have major dental surgery while on this medication. See your dentist to have a dental exam and fix any dental problems before starting this medication. Take good care of your teeth while on this medication. Make sure you see your dentist for regular follow-up appointments. What side effects may I notice from receiving this medication? Side effects that you should report to your care team as soon as possible: Allergic reactions--skin rash, itching, hives, swelling of the face, lips, tongue, or throat Kidney injury--decrease in the amount of urine, swelling of the ankles, hands, or feet Low calcium level--muscle pain or cramps, confusion, tingling, or numbness in the hands or feet Osteonecrosis of the jaw--pain, swelling, or redness in the mouth, numbness of the jaw, poor healing after dental work, unusual discharge from the mouth, visible bones in the mouth Severe bone, joint, or muscle pain Side effects that usually do not require medical attention (report to your care team if they continue or are bothersome): Diarrhea Dizziness Headache Nausea Stomach pain Vomiting This list may not describe all possible side effects. Call your doctor for medical advice about side effects. You may report side effects to FDA at 1-800-FDA-1088. Where should I keep my medication? This medication is given in a hospital or clinic. It will not be stored at home. NOTE: This sheet is a summary. It may not cover all possible information. If you have questions about this medicine, talk to your doctor, pharmacist, or health care provider.  2023 Elsevier/Gold Standard (2021-06-21 00:00:00)    To help prevent nausea and vomiting after your treatment, we encourage you to take your nausea medication as directed.  BELOW ARE SYMPTOMS THAT SHOULD BE REPORTED IMMEDIATELY: *FEVER GREATER THAN 100.4 F (38 C) OR HIGHER *CHILLS OR SWEATING *NAUSEA AND VOMITING THAT IS NOT CONTROLLED  WITH YOUR NAUSEA MEDICATION *UNUSUAL SHORTNESS OF BREATH *UNUSUAL BRUISING OR BLEEDING *URINARY PROBLEMS (pain or burning when urinating, or frequent urination) *BOWEL PROBLEMS (unusual diarrhea, constipation, pain near the anus) TENDERNESS IN MOUTH AND THROAT WITH OR WITHOUT PRESENCE OF ULCERS (sore throat, sores in mouth, or a toothache) UNUSUAL RASH, SWELLING OR PAIN  UNUSUAL VAGINAL DISCHARGE OR ITCHING   Items with * indicate a potential emergency and should be followed up as soon as possible or go to the Emergency Department if any problems should occur.  Please show the CHEMOTHERAPY ALERT CARD or IMMUNOTHERAPY ALERT CARD at check-in to the Emergency Department and triage nurse.  Should you have questions after your visit or need to cancel or reschedule your appointment, please contact [facility] [phone]  and follow the prompts.  Office hours are 8:00 a.m. to 4:30 p.m. Monday - Friday. Please note that voicemails left after 4:00 p.m. may not be returned until the following business day.  We are closed weekends and major holidays. You have access to a nurse at all times for urgent questions. Please call the main number to the clinic [phone] and follow the prompts.  For any non-urgent questions, you may also contact your provider using MyChart. We now offer e-Visits for anyone 39 and older to  request care online for non-urgent symptoms. For details visit [email or web address].   Also download the MyChart app! Go to the app store, search "MyChart", open the app, select [facility], and log in with your MyChart username and password.

## 2022-06-05 DIAGNOSIS — J449 Chronic obstructive pulmonary disease, unspecified: Secondary | ICD-10-CM | POA: Diagnosis not present

## 2022-06-18 ENCOUNTER — Telehealth: Payer: Self-pay | Admitting: *Deleted

## 2022-06-18 DIAGNOSIS — M47897 Other spondylosis, lumbosacral region: Secondary | ICD-10-CM | POA: Diagnosis not present

## 2022-06-18 DIAGNOSIS — Z79891 Long term (current) use of opiate analgesic: Secondary | ICD-10-CM | POA: Diagnosis not present

## 2022-06-18 DIAGNOSIS — G894 Chronic pain syndrome: Secondary | ICD-10-CM | POA: Diagnosis not present

## 2022-06-18 DIAGNOSIS — M47816 Spondylosis without myelopathy or radiculopathy, lumbar region: Secondary | ICD-10-CM | POA: Diagnosis not present

## 2022-06-18 NOTE — Telephone Encounter (Signed)
Patient called to advise that her left breast is hard, swollen, deformed and very painful.  Denies redness or fever.  Symptoms began about 1 week ago.  Appointment made to see Dr. Delton Coombes tomorrow.

## 2022-06-19 ENCOUNTER — Inpatient Hospital Stay: Payer: Medicare Other | Attending: Hematology | Admitting: Hematology

## 2022-06-19 VITALS — BP 142/69 | HR 87 | Temp 97.7°F | Resp 18 | Wt 151.7 lb

## 2022-06-19 DIAGNOSIS — M545 Low back pain, unspecified: Secondary | ICD-10-CM | POA: Diagnosis not present

## 2022-06-19 DIAGNOSIS — Z87891 Personal history of nicotine dependence: Secondary | ICD-10-CM | POA: Insufficient documentation

## 2022-06-19 DIAGNOSIS — Z170 Estrogen receptor positive status [ER+]: Secondary | ICD-10-CM | POA: Diagnosis not present

## 2022-06-19 DIAGNOSIS — Z79811 Long term (current) use of aromatase inhibitors: Secondary | ICD-10-CM | POA: Insufficient documentation

## 2022-06-19 DIAGNOSIS — C50412 Malignant neoplasm of upper-outer quadrant of left female breast: Secondary | ICD-10-CM | POA: Diagnosis not present

## 2022-06-19 DIAGNOSIS — Z79899 Other long term (current) drug therapy: Secondary | ICD-10-CM | POA: Insufficient documentation

## 2022-06-19 DIAGNOSIS — M79629 Pain in unspecified upper arm: Secondary | ICD-10-CM | POA: Diagnosis not present

## 2022-06-19 DIAGNOSIS — M810 Age-related osteoporosis without current pathological fracture: Secondary | ICD-10-CM | POA: Diagnosis not present

## 2022-06-19 DIAGNOSIS — G8929 Other chronic pain: Secondary | ICD-10-CM | POA: Diagnosis not present

## 2022-06-19 NOTE — Progress Notes (Signed)
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 No pathogenic variants identified on the [facility] Multi-Cancer+RNA panel. The report date is 07/25/2021.  The Multi-Cancer Panel + RNA offered by [facility] includes sequencing and/or deletion duplication testing of the following 84 genes: AIP, ALK, APC, ATM, AXIN2,BAP1,  BARD1, BLM, BMPR1A, BRCA1, BRCA2, BRIP1, CASR, CDC73, CDH1, CDK4, CDKN1B, CDKN1C, CDKN2A (p14ARF), CDKN2A (p16INK4a), CEBPA, CHEK2, CTNNA1, DICER1, DIS3L2, EGFR (c.2369C>T, p.Thr790Met variant only), EPCAM (Deletion/duplication testing only), FH, FLCN, GATA2, GPC3, GREM1 (Promoter region deletion/duplication testing only), HOXB13 (c.251G>A, p.Gly84Glu), HRAS, KIT, MAX, MEN1, MET, MITF (c.952G>A, p.Glu318Lys variant only), MLH1, MSH2, MSH3, MSH6, MUTYH, NBN, NF1, NF2, NTHL1, PALB2, PDGFRA, PHOX2B, PMS2, POLD1, POLE, POT1, PRKAR1A, PTCH1, PTEN, RAD50, RAD51C, RAD51D, RB1, RECQL4, RET, RUNX1, SDHAF2, SDHA (sequence changes only), SDHB, SDHC, SDHD, SMAD4, SMARCA4, SMARCB1, SMARCE1, STK11, SUFU, TERC, TERT, TMEM127, TP53, TSC1, TSC2, VHL, WRN and WT1.     CANCER STAGING:  Cancer Staging  Malignant neoplasm of  upper-outer quadrant of left breast in female, estrogen receptor positive ([place]) Staging form: Breast, AJCC 8th Edition - Clinical stage from 07/01/2021: Stage IB (cT1b, cN0, cM0, G3, ER+, PR-, HER2-) - Unsigned   INTERVAL HISTORY:  [name], a 67 y.o. female, seen for follow-up of her left breast cancer. She was last seen by me on 05/20/22.  Today, she states that she is doing well overall. However, x1 week she has had severe left breast pain diffusely with accompanying swelling and an area of firmness. She woke up with the pain and it has gradually worsened since. Her pain is worsened with movement and breathing. Patient took a Percocet 10 this morning with minimal pain relief (this is prescribed by her spine doctor). She denies any trauma or falls. She wonders if this is related to the IV Reclast she received on 06/03/22. Her appetite level is at 100%. Her energy level is at 100%. She has been compliant with Anastrozole.   REVIEW OF SYSTEMS:  Review of Systems  Constitutional:  Negative for chills, fatigue and fever.  HENT:   Negative for lump/mass, mouth sores, nosebleeds, sore throat and trouble swallowing.   Eyes:  Negative for eye problems.  Respiratory:  Positive for cough and shortness of breath.   Cardiovascular:  Negative for chest pain, leg swelling and palpitations.  Gastrointestinal:  Negative for abdominal pain, constipation, diarrhea, nausea and vomiting.  Genitourinary:  Negative for bladder incontinence, difficulty urinating, dysuria, frequency, hematuria and nocturia.   Musculoskeletal:  Positive for myalgias (left breast pain and swelling x1 week). Negative for arthralgias, back pain, flank pain and neck pain.  Skin:  Negative for itching and rash.  Neurological:  Negative for dizziness, headaches and numbness.  Hematological:  Does not bruise/bleed easily.  Psychiatric/Behavioral:  Negative for depression, sleep disturbance and suicidal ideas. The patient is not  nervous/anxious.   All other systems reviewed and are negative.   PAST MEDICAL/SURGICAL HISTORY:  Past Medical History:  Diagnosis Date   Anxiety    Arthritis    Asthma    Chronic back pain    Colon polyps    COPD (chronic obstructive pulmonary disease) (HCC)    Depression    Emphysema of lung (HCC)    Family history of breast cancer    GERD (gastroesophageal reflux disease)    Memory change    Port-A-Cath in place 08/01/2021   Sleep apnea    TIA (transient ischemic attack)    Tremor    Past Surgical History:  Procedure Laterality Date   ABDOMINAL HYSTERECTOMY     BREAST LUMPECTOMY WITH RADIOFREQUENCY TAG IDENTIFICATION Left 11/16/1599   Procedure: BREAST LUMPECTOMY WITH RADIOFREQUENCY TAG IDENTIFICATION;  Surgeon: Virl Cagey, MD;  Location: [facility];  Service: General;  Laterality: Left;   CERVICAL SPINE SURGERY     INNER EAR SURGERY     LUMBAR FUSION  Aug 05, 2010   L3-L7   PARTIAL MASTECTOMY WITH AXILLARY SENTINEL LYMPH NODE BIOPSY Left 05/31/2021   Procedure: PARTIAL MASTECTOMY WITH AXILLARY SENTINEL LYMPH NODE BIOPSY;  Surgeon: Virl Cagey, MD;  Location: [facility];  Service: General;  Laterality: Left;   PORT-A-CATH REMOVAL N/A 01/15/2022   Procedure: MINOR REMOVAL PORT-A-CATH;  Surgeon: Virl Cagey, MD;  Location: [facility];  Service: General;  Laterality: N/A;   PORTACATH PLACEMENT Right 07/30/2021   Procedure: INSERTION PORT-A-CATH;  Surgeon: Virl Cagey, MD;  Location: [facility];  Service: General;  Laterality: Right;    SOCIAL HISTORY:  Social History   Socioeconomic History   Marital status: Widowed    Spouse name: Not on file   Number of children: 1   Years of education: 52   Highest education level: 11th grade  Occupational History   Not on file  Tobacco Use   Smoking status: Former    Packs/day: 0.50    Types: Cigarettes    Quit date: 08/17/2021    Years since quitting: 0.8   Smokeless tobacco: Never   Tobacco comments:    Used to smoke a  pack a day, Not smoking cigarettes but is vaping  09/12/2021  Vaping Use   Vaping Use: Every day   Substances: Flavoring  Substance and Sexual Activity   Alcohol use: Not Currently   Drug use: Not Currently    Types: Other-see comments    Comment: CBD and medical marijuana- denies 8/28/2,04/22/21   Sexual activity: Not Currently  Other Topics Concern   Not on file  Social History Narrative   04/22/21 Lives alone   From [place], moved to [place] in 08-Jul-2019   Husband passed away in 08-04-16   Social Determinants of Health   Financial Resource Strain: Not on file  Food Insecurity: Not on file  Transportation Needs: Not on file  Physical Activity: Not on file  Stress: Not on file  Social Connections: Not on file  Intimate Partner Violence: Not on file    FAMILY HISTORY:  Family History  Problem Relation Age of Onset   Depression Mother    Diabetes Mother    Hypertension Mother    Hyperlipidemia Mother    Heart attack Mother    Osteoarthritis Father    Asthma Father    COPD Father    Breast  cancer Sister        dx 32s-60s   Lupus Sister    Osteoarthritis Sister    Asthma Sister    COPD Sister    Diabetes Sister    Drug abuse Sister    Heart attack Sister    Heart attack Maternal Grandmother    Colon cancer Neg Hx    Esophageal cancer Neg Hx     CURRENT MEDICATIONS:  Current Outpatient Medications  Medication Sig Dispense Refill   albuterol (PROVENTIL) (2.5 MG/3ML) 0.083% nebulizer solution Take 3 mLs (2.5 mg total) by nebulization every 6 (six) hours as needed for wheezing or shortness of breath. 150 mL 1   albuterol (VENTOLIN HFA) 108 (90 Base) MCG/ACT inhaler INHALE 1 TO 2 PUFFS INTO THE LUNGS EVERY 6 HOURS AS NEEDED FOR WHEEZING OR SHORTNESS OF BREATH 17 g 3   anastrozole (ARIMIDEX) 1 MG tablet Take 1 tablet (1 mg total) by mouth daily. 30 tablet 6   atorvastatin (LIPITOR) 20 MG tablet Take 1 tablet (20 mg total) by mouth daily. 90 tablet 3   budesonide-formoterol  (SYMBICORT) 160-4.5 MCG/ACT inhaler Inhale 2 puffs into the lungs 2 (two) times daily. 10.2 g 11   celecoxib (CELEBREX) 100 MG capsule TAKE 1 CAPSULE(100 MG) BY MOUTH TWICE DAILY 180 capsule 0   cyclobenzaprine (FLEXERIL) 10 MG tablet Take 1 tablet (10 mg total) by mouth 3 (three) times daily. 90 tablet 1   diclofenac Sodium (VOLTAREN) 1 % GEL SMARTSIG:Gram(s) Topical Twice Daily     furosemide (LASIX) 20 MG tablet TAKE 1 TABLET(20 MG) BY MOUTH DAILY AS NEEDED FOR FLUID RETENTION 30 tablet 1   gabapentin (NEURONTIN) 600 MG tablet TAKE 1 TABLET BY MOUTH IN AM, TAKE 1 TABLET BY MOUTH AT NOON, AND TAKE 2 TABLET BY MOUTH AT BEDTIME 360 tablet 1   lidocaine (LIDODERM) 5 % Place 3 patches onto the skin daily as needed (pain).     magnesium oxide (MAG-OX) 400 (240 Mg) MG tablet TAKE 1 TABLET BY MOUTH IN THE MORNING, AT NOON AND AT BEDTIME. 90 tablet 2   montelukast (SINGULAIR) 10 MG tablet Take 1 tablet (10 mg total) by mouth at bedtime. 90 tablet 1   naloxone (NARCAN) nasal spray 4 mg/0.1 mL      oxyCODONE-acetaminophen (PERCOCET) 10-325 MG tablet Take 1 tablet by mouth 6 (six) times daily.     pantoprazole (PROTONIX) 40 MG tablet TAKE 1 TABLET(40 MG) BY MOUTH TWICE DAILY 180 tablet 0   sertraline (ZOLOFT) 50 MG tablet TAKE 1 TABLET(50 MG) BY MOUTH DAILY 90 tablet 1   SPIRIVA RESPIMAT 2.5 MCG/ACT AERS INHALE 2 PUFFS INTO THE LUNGS DAILY 4 g 3   tiZANidine (ZANAFLEX) 4 MG tablet Take 1 tablet (4 mg total) by mouth 3 (three) times daily. 270 tablet 1   prochlorperazine (COMPAZINE) 10 MG tablet TAKE 1 TABLET(10 MG) BY MOUTH EVERY 6 HOURS AS NEEDED FOR NAUSEA OR VOMITING (Patient not taking: Reported on 06/19/2022) 30 tablet 1   traZODone (DESYREL) 100 MG tablet Take 2 tablets (200 mg total) by mouth at bedtime. 180 tablet 1   No current facility-administered medications for this visit.    ALLERGIES:  Allergies  Allergen Reactions   Mucinex [Guaifenesin Er] Other (See Comments)    Jerky movements     PHYSICAL EXAM:  Performance status (ECOG): 1 - Symptomatic but completely ambulatory  Vitals:   06/19/22 1137  BP: (!) 142/69  Pulse: 87  Resp: 18  Temp: 97.7 F (  36.5 C)  SpO2: 95%   Wt Readings from Last 3 Encounters:  06/19/22 68.8 kg (151 lb 11.2 oz)  06/03/22 67.4 kg (148 lb 9.6 oz)  05/20/22 69.4 kg (152 lb 14.4 oz)   Physical Exam Vitals and nursing note reviewed. Exam conducted with a chaperone present.  Constitutional:      Appearance: Normal appearance.  Cardiovascular:     Rate and Rhythm: Normal rate and regular rhythm.     Pulses: Normal pulses.     Heart sounds: Normal heart sounds.  Pulmonary:     Effort: Pulmonary effort is normal.     Breath sounds: Normal breath sounds.  Chest:  Breasts:    Left: Mass and tenderness present. No bleeding or skin change.     Comments: Left breat- area of thickening and fullness in upper quadrant at 12 o'clock Abdominal:     Palpations: Abdomen is soft. There is no hepatomegaly, splenomegaly or mass.     Tenderness: There is no abdominal tenderness.  Lymphadenopathy:     Upper Body:     Right upper body: No supraclavicular, axillary or pectoral adenopathy.     Left upper body: No supraclavicular, axillary or pectoral adenopathy.  Neurological:     General: No focal deficit present.     Mental Status: She is alert and oriented to person, place, and time.  Psychiatric:        Mood and Affect: Mood normal.        Behavior: Behavior normal.    Breast Exam Chaperone: Anastasio Champion, RN    LABORATORY DATA:  I have reviewed the labs as listed.     Latest Ref Rng & Units 06/03/2022    1:09 PM 05/20/2022   10:50 AM 03/19/2022    9:26 AM  CBC  WBC 4.0 - 10.5 K/uL 9.4  8.6  8.4   Hemoglobin 12.0 - 15.0 g/dL 13.9  13.1  11.0   Hematocrit 36.0 - 46.0 % 44.4  41.9  33.2   Platelets 150 - 400 K/uL 366  331  305.0       Latest Ref Rng & Units 06/03/2022    1:09 PM 05/20/2022   10:50 AM 03/19/2022    9:26 AM  CMP   Glucose 70 - 99 mg/dL 108  107  126   BUN 8 - 23 mg/dL '13  14  13   '$ Creatinine 0.44 - 1.00 mg/dL 0.77  0.60  0.68   Sodium 135 - 145 mmol/L 140  138  139   Potassium 3.5 - 5.1 mmol/L 3.3  4.2  4.1   Chloride 98 - 111 mmol/L 100  98  101   CO2 22 - 32 mmol/L 29  29  33   Calcium 8.9 - 10.3 mg/dL 9.7  9.4  9.4   Total Protein 6.5 - 8.1 g/dL 7.7  7.3  6.7   Total Bilirubin 0.3 - 1.2 mg/dL 0.4  0.7  0.4   Alkaline Phos 38 - 126 U/L 100  96  78   AST 15 - 41 U/L '21  17  18   '$ ALT 0 - 44 U/L '16  13  11     '$ DIAGNOSTIC IMAGING:  I have independently reviewed the scans and discussed with the patient. DG Si Joints  Result Date: 05/23/2022 CLINICAL DATA:  Chronic low back pain and SI joint region pain. EXAM: BILATERAL SACROILIAC JOINTS - 3+ VIEW COMPARISON:  None Available. FINDINGS: No fracture or bone lesion. SI joints  are normally spaced and aligned. No degenerative/arthropathic changes. Soft tissues are unremarkable. IMPRESSION: Negative. Electronically Signed   By: Lajean Manes M.D.   On: 05/23/2022 15:58   DG Lumbar Spine Complete W/Bend  Result Date: 05/23/2022 CLINICAL DATA:  Low back pain for many years. EXAM: LUMBAR SPINE - COMPLETE WITH BENDING VIEWS COMPARISON:  None Available. FINDINGS: There is no evidence of lumbar spine fracture. Scoliosis of spine. Mild facet joint sclerosis is identified in the mid to lower lumbar spine. IMPRESSION: Mild degenerative joint changes of lumbar spine. Electronically Signed   By: Abelardo Diesel M.D.   On: 05/23/2022 14:09     ASSESSMENT:  Stage I (T1BN0) left breast IDC, ER weakly positive: - She has been on estrogen supplements started at age 30 after TAH and BSO for endometriosis.  She continued estrogen into her 58s. - Left breast biopsy on 04/29/2021, IDC, Ki-67 30%, grade 3, ER-5% positive, PR negative, HER2 2+ by IHC and negative by FISH - Left lumpectomy and SLNB on 05/31/2021, 0/5 lymph nodes involved, 8 mm invasive ductal carcinoma, grade 3,  margins negative.  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 - Oncotype DX recurrence score 60.  Distant recurrence rate at 9 years more than 39%.  Average absolute chemotherapy benefit more than 15%. - 4 cycles of dose attenuated TC from 08/05/2021 through 10/07/2021       -XRT to the left breast from 11/07/2021 through 12/05/2021       -Anastrozole started on 01/09/2022    Social/family history: - She is a retired Regulatory affairs officer. - Current active smoker, 1 pack/day for the last 43 years. - No family history of malignancies.   PLAN:  Stage I (T1b N0 M0) left breast cancer: - She is tolerating anastrozole. - Today she is seen on an unscheduled visit as she developed left breast swelling and pain for the last 1 week.  She reportedly woke up with it.  Denies any trauma. - Physical exam shows fullness and vague mass in the left upper quadrant at 12 o'clock position.  Area is tender to palpation.  No lymphadenopathy palpable. - I have recommended ultrasound of the left breast with or without diagnostic mammogram.  I doubt she can tolerate mammogram because of pain.  Will see her back after the ultrasound.  2.  Osteoporosis (DEXA 07/08/2021 with T score -2.6): - She received zoledronic acid in mid January this year.  Calcium is normal.  Continue calcium and vitamin D supplements.  3.  Hypomagnesemia: - Continue magnesium daily.  Magnesium is normal.    Orders placed this encounter:  Orders Placed This Encounter  Procedures   US Breast Complete [place] as a scribe for Derek Jack, MD.,have documented all relevant documentation on the behalf of Derek Jack, MD,as directed by  Derek Jack, MD while in the presence of Derek Jack, MD.  I, Derek Jack MD, have reviewed the above documentation for accuracy and completeness, and I agree with the above.   Derek Jack, MD [place] [phone]

## 2022-06-20 ENCOUNTER — Other Ambulatory Visit: Payer: Self-pay | Admitting: Hematology

## 2022-06-20 ENCOUNTER — Other Ambulatory Visit: Payer: Self-pay | Admitting: Physician Assistant

## 2022-06-20 DIAGNOSIS — Z170 Estrogen receptor positive status [ER+]: Secondary | ICD-10-CM

## 2022-06-20 DIAGNOSIS — Z95828 Presence of other vascular implants and grafts: Secondary | ICD-10-CM

## 2022-06-24 ENCOUNTER — Encounter (HOSPITAL_COMMUNITY): Payer: Self-pay | Admitting: Hematology

## 2022-06-24 ENCOUNTER — Ambulatory Visit
Admission: RE | Admit: 2022-06-24 | Discharge: 2022-06-24 | Disposition: A | Discharge status: Home / Self Care | Payer: Medicare Other | Source: Ambulatory Visit | Attending: Hematology | Admitting: Hematology

## 2022-06-24 DIAGNOSIS — Z170 Estrogen receptor positive status [ER+]: Secondary | ICD-10-CM

## 2022-06-24 DIAGNOSIS — N644 Mastodynia: Secondary | ICD-10-CM | POA: Diagnosis not present

## 2022-06-24 HISTORY — DX: Personal history of irradiation: Z92.3

## 2022-06-24 HISTORY — DX: Malignant neoplasm of unspecified site of unspecified female breast: C50.919

## 2022-06-24 HISTORY — DX: Personal history of antineoplastic chemotherapy: Z92.21

## 2022-06-26 ENCOUNTER — Inpatient Hospital Stay (HOSPITAL_BASED_OUTPATIENT_CLINIC_OR_DEPARTMENT_OTHER): Payer: Medicare Other | Admitting: Hematology

## 2022-06-26 VITALS — BP 148/71 | HR 120 | Temp 97.9°F | Resp 19 | Wt 148.3 lb

## 2022-06-26 DIAGNOSIS — G8929 Other chronic pain: Secondary | ICD-10-CM | POA: Diagnosis not present

## 2022-06-26 DIAGNOSIS — Z79899 Other long term (current) drug therapy: Secondary | ICD-10-CM | POA: Diagnosis not present

## 2022-06-26 DIAGNOSIS — Z170 Estrogen receptor positive status [ER+]: Secondary | ICD-10-CM | POA: Diagnosis not present

## 2022-06-26 DIAGNOSIS — C50412 Malignant neoplasm of upper-outer quadrant of left female breast: Secondary | ICD-10-CM

## 2022-06-26 DIAGNOSIS — Z79811 Long term (current) use of aromatase inhibitors: Secondary | ICD-10-CM | POA: Diagnosis not present

## 2022-06-26 DIAGNOSIS — Z87891 Personal history of nicotine dependence: Secondary | ICD-10-CM | POA: Diagnosis not present

## 2022-06-26 DIAGNOSIS — M810 Age-related osteoporosis without current pathological fracture: Secondary | ICD-10-CM | POA: Diagnosis not present

## 2022-06-26 DIAGNOSIS — M79629 Pain in unspecified upper arm: Secondary | ICD-10-CM | POA: Diagnosis not present

## 2022-06-26 DIAGNOSIS — M545 Low back pain, unspecified: Secondary | ICD-10-CM | POA: Diagnosis not present

## 2022-06-26 NOTE — Progress Notes (Signed)
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 No pathogenic variants identified on the [facility] Multi-Cancer+RNA panel. The report date is 07/25/2021.  The Multi-Cancer Panel + RNA offered by [facility] includes sequencing and/or deletion duplication testing of the following 84 genes: AIP, ALK, APC, ATM, AXIN2,BAP1,  BARD1, BLM, BMPR1A, BRCA1, BRCA2, BRIP1, CASR, CDC73, CDH1, CDK4, CDKN1B, CDKN1C, CDKN2A (p14ARF), CDKN2A (p16INK4a), CEBPA, CHEK2, CTNNA1, DICER1, DIS3L2, EGFR (c.2369C>T, p.Thr790Met variant only), EPCAM (Deletion/duplication testing only), FH, FLCN, GATA2, GPC3, GREM1 (Promoter region deletion/duplication testing only), HOXB13 (c.251G>A, p.Gly84Glu), HRAS, KIT, MAX, MEN1, MET, MITF (c.952G>A, p.Glu318Lys variant only), MLH1, MSH2, MSH3, MSH6, MUTYH, NBN, NF1, NF2, NTHL1, PALB2, PDGFRA, PHOX2B, PMS2, POLD1, POLE, POT1, PRKAR1A, PTCH1, PTEN, RAD50, RAD51C, RAD51D, RB1, RECQL4, RET, RUNX1, SDHAF2, SDHA (sequence changes only), SDHB, SDHC, SDHD, SMAD4, SMARCA4, SMARCB1, SMARCE1, STK11, SUFU, TERC, TERT, TMEM127, TP53, TSC1, TSC2, VHL, WRN and WT1.     CANCER STAGING:  Cancer Staging  Malignant neoplasm of  upper-outer quadrant of left breast in female, estrogen receptor positive ([place]) Staging form: Breast, AJCC 8th Edition - Clinical stage from 07/01/2021: Stage IB (cT1b, cN0, cM0, G3, ER+, PR-, HER2-) - Unsigned   INTERVAL HISTORY:  Ms. Emerita Berkemeier, a 67 y.o. female, seen for follow-up of her left breast cancer.She was last seen by me on 06/19/22. She had her left partial mastectomy with Dr. [name] on 05/31/21.  Today, she states that she continues to have left lateral breast and axilla pain. Her pain is a 7/10 in severity and has not improved since her last visit with me. The pain is intermittent and is triggered by coughing, palpation, and movement. Her pain is relieved with sitting still and with taking Percocet 10 QID (she is prescribed Percocet by her specialist for her chronic back pain). She denies any worsening of her pain after her mammogram on 06/24/22. Her appetite level is at 85%. Her energy level is at 0%.  REVIEW OF SYSTEMS:  Review of Systems  Constitutional:  Negative for chills, fatigue and fever.  HENT:   Negative for lump/mass, mouth sores, nosebleeds, sore throat and trouble swallowing.   Eyes:  Negative for eye problems.  Respiratory:  Positive for cough and shortness of breath.   Cardiovascular:  Negative for chest pain, leg swelling and palpitations.  Gastrointestinal:  Negative for abdominal pain, constipation, diarrhea, nausea and vomiting.  Genitourinary:  Negative for bladder incontinence, difficulty urinating, dysuria, frequency, hematuria and nocturia.   Musculoskeletal:  Negative for arthralgias, back pain, flank pain, myalgias and neck pain.       + left breast pain  Skin:  Negative for itching and rash.  Neurological:  Negative for dizziness, headaches and numbness.  Hematological:  Does  not bruise/bleed easily.  Psychiatric/Behavioral:  Negative for depression, sleep disturbance and suicidal ideas. The patient is nervous/anxious.   All other systems  reviewed and are negative.   PAST MEDICAL/SURGICAL HISTORY:  Past Medical History:  Diagnosis Date   Anxiety    Arthritis    Asthma    Breast cancer ([place])    Chronic back pain    Colon polyps    COPD (chronic obstructive pulmonary disease) (HCC)    Depression    Emphysema of lung (HCC)    Family history of breast cancer    GERD (gastroesophageal reflux disease)    Memory change    Personal history of chemotherapy    Personal history of radiation therapy    Port-A-Cath in place 08/01/2021   Sleep apnea    TIA (transient ischemic attack)    Tremor    Past Surgical History:  Procedure Laterality Date   ABDOMINAL HYSTERECTOMY     BREAST LUMPECTOMY WITH RADIOFREQUENCY TAG IDENTIFICATION Left 3/64/6803   Procedure: BREAST LUMPECTOMY WITH RADIOFREQUENCY TAG IDENTIFICATION;  Surgeon: Virl Cagey, MD;  Location: [facility];  Service: General;  Laterality: Left;   CERVICAL SPINE SURGERY     INNER EAR SURGERY     LUMBAR FUSION  08/29/2010   L3-L7   PARTIAL MASTECTOMY WITH AXILLARY SENTINEL LYMPH NODE BIOPSY Left 05/31/2021   Procedure: PARTIAL MASTECTOMY WITH AXILLARY SENTINEL LYMPH NODE BIOPSY;  Surgeon: Virl Cagey, MD;  Location: [facility];  Service: General;  Laterality: Left;   PORT-A-CATH REMOVAL N/A 01/15/2022   Procedure: MINOR REMOVAL PORT-A-CATH;  Surgeon: Virl Cagey, MD;  Location: [facility];  Service: General;  Laterality: N/A;   PORTACATH PLACEMENT Right 07/30/2021   Procedure: INSERTION PORT-A-CATH;  Surgeon: Virl Cagey, MD;  Location: [facility];  Service: General;  Laterality: Right;    SOCIAL HISTORY:  Social History   Socioeconomic History   Marital status: Widowed    Spouse name: Not on file   Number of children: 1   Years of education: 84   Highest education level: 11th grade  Occupational History   Not on file  Tobacco Use   Smoking status: Former    Packs/day: 0.50    Types: Cigarettes    Quit date: 08/17/2021    Years since quitting: 0.8    Smokeless tobacco: Never   Tobacco comments:    Used to smoke a pack a day, Not smoking cigarettes but is vaping  09/12/2021  Vaping Use   Vaping Use: Every day   Substances: Flavoring  Substance and Sexual Activity   Alcohol use: Not Currently   Drug use: Not Currently    Types: Other-see comments    Comment: CBD and medical marijuana- denies 8/28/2,04/22/21   Sexual activity: Not Currently  Other Topics Concern   Not on file  Social History Narrative   04/22/21 Lives alone   From [place], moved to [place] in 2019-08-01   Husband passed away in August 28, 2016   Social Determinants of Health   Financial Resource Strain: Not on file  Food Insecurity: Not on file  Transportation Needs: Not on file  Physical Activity: Not on file  Stress: Not on file  Social Connections: Not on file  Intimate Partner Violence: Not on file    FAMILY HISTORY:  Family History  Problem Relation Age of Onset   Depression Mother    Diabetes Mother    Hypertension Mother    Hyperlipidemia Mother    Heart attack Mother  Osteoarthritis Father    Asthma Father    COPD Father    Breast cancer Sister        dx 57s-60s   Lupus Sister    Osteoarthritis Sister    Asthma Sister    COPD Sister    Diabetes Sister    Drug abuse Sister    Heart attack Sister    Heart attack Maternal Grandmother    Colon cancer Neg Hx    Esophageal cancer Neg Hx     CURRENT MEDICATIONS:  Current Outpatient Medications  Medication Sig Dispense Refill   albuterol (PROVENTIL) (2.5 MG/3ML) 0.083% nebulizer solution Take 3 mLs (2.5 mg total) by nebulization every 6 (six) hours as needed for wheezing or shortness of breath. 150 mL 1   albuterol (VENTOLIN HFA) 108 (90 Base) MCG/ACT inhaler INHALE 1 TO 2 PUFFS INTO THE LUNGS EVERY 6 HOURS AS NEEDED FOR WHEEZING OR SHORTNESS OF BREATH 17 g 3   anastrozole (ARIMIDEX) 1 MG tablet Take 1 tablet (1 mg total) by mouth daily. 30 tablet 6   atorvastatin (LIPITOR) 20 MG tablet Take 1 tablet  (20 mg total) by mouth daily. 90 tablet 3   budesonide-formoterol (SYMBICORT) 160-4.5 MCG/ACT inhaler Inhale 2 puffs into the lungs 2 (two) times daily. 10.2 g 11   celecoxib (CELEBREX) 100 MG capsule TAKE 1 CAPSULE(100 MG) BY MOUTH TWICE DAILY 180 capsule 0   cyclobenzaprine (FLEXERIL) 10 MG tablet Take 1 tablet (10 mg total) by mouth 3 (three) times daily. 90 tablet 1   diclofenac Sodium (VOLTAREN) 1 % GEL SMARTSIG:Gram(s) Topical Twice Daily     furosemide (LASIX) 20 MG tablet TAKE 1 TABLET(20 MG) BY MOUTH DAILY AS NEEDED FOR FLUID RETENTION 30 tablet 1   gabapentin (NEURONTIN) 600 MG tablet TAKE 1 TABLET BY MOUTH IN AM, TAKE 1 TABLET BY MOUTH AT NOON, AND TAKE 2 TABLET BY MOUTH AT BEDTIME 360 tablet 1   lidocaine (LIDODERM) 5 % Place 3 patches onto the skin daily as needed (pain).     magnesium oxide (MAG-OX) 400 (240 Mg) MG tablet TAKE 1 TABLET BY MOUTH IN THE MORNING, AT NOON AND AT BEDTIME. 90 tablet 2   montelukast (SINGULAIR) 10 MG tablet Take 1 tablet (10 mg total) by mouth at bedtime. 90 tablet 1   naloxone (NARCAN) nasal spray 4 mg/0.1 mL      oxyCODONE-acetaminophen (PERCOCET) 10-325 MG tablet Take 1 tablet by mouth 6 (six) times daily.     pantoprazole (PROTONIX) 40 MG tablet TAKE 1 TABLET(40 MG) BY MOUTH TWICE DAILY 180 tablet 0   prochlorperazine (COMPAZINE) 10 MG tablet TAKE 1 TABLET(10 MG) BY MOUTH EVERY 6 HOURS AS NEEDED FOR NAUSEA OR VOMITING 30 tablet 1   sertraline (ZOLOFT) 50 MG tablet TAKE 1 TABLET(50 MG) BY MOUTH DAILY 90 tablet 1   SPIRIVA RESPIMAT 2.5 MCG/ACT AERS INHALE 2 PUFFS INTO THE LUNGS DAILY 4 g 3   tiZANidine (ZANAFLEX) 4 MG tablet TAKE 1 TABLET(4 MG) BY MOUTH THREE TIMES DAILY 270 tablet 1   traZODone (DESYREL) 100 MG tablet Take 2 tablets (200 mg total) by mouth at bedtime. 180 tablet 1   No current facility-administered medications for this visit.    ALLERGIES:  Allergies  Allergen Reactions   Mucinex [Guaifenesin Er] Other (See Comments)    Jerky  movements    PHYSICAL EXAM:  Performance status (ECOG): 1 - Symptomatic but completely ambulatory  There were no vitals filed for this visit.  Wt Readings from  Last 3 Encounters:  06/19/22 68.8 kg (151 lb 11.2 oz)  06/03/22 67.4 kg (148 lb 9.6 oz)  05/20/22 69.4 kg (152 lb 14.4 oz)   Physical Exam Vitals reviewed. Exam conducted with a chaperone present.  Constitutional:      Appearance: Normal appearance.  Cardiovascular:     Rate and Rhythm: Normal rate and regular rhythm.     Pulses: Normal pulses.     Heart sounds: Normal heart sounds.  Pulmonary:     Effort: Pulmonary effort is normal.     Breath sounds: Normal breath sounds.  Abdominal:     Palpations: Abdomen is soft. There is no hepatomegaly, splenomegaly or mass.     Tenderness: There is no abdominal tenderness.  Lymphadenopathy:     Upper Body:     Right upper body: No supraclavicular, axillary or pectoral adenopathy.     Left upper body: No supraclavicular, axillary or pectoral adenopathy.  Neurological:     General: No focal deficit present.     Mental Status: She is alert and oriented to person, place, and time.  Psychiatric:        Mood and Affect: Mood normal.        Behavior: Behavior normal.      LABORATORY DATA:  I have reviewed the labs as listed.     Latest Ref Rng & Units 06/03/2022    1:09 PM 05/20/2022   10:50 AM 03/19/2022    9:26 AM  CBC  WBC 4.0 - 10.5 K/uL 9.4  8.6  8.4   Hemoglobin 12.0 - 15.0 g/dL 13.9  13.1  11.0   Hematocrit 36.0 - 46.0 % 44.4  41.9  33.2   Platelets 150 - 400 K/uL 366  331  305.0       Latest Ref Rng & Units 06/03/2022    1:09 PM 05/20/2022   10:50 AM 03/19/2022    9:26 AM  CMP  Glucose 70 - 99 mg/dL 108  107  126   BUN 8 - 23 mg/dL '13  14  13   '$ Creatinine 0.44 - 1.00 mg/dL 0.77  0.60  0.68   Sodium 135 - 145 mmol/L 140  138  139   Potassium 3.5 - 5.1 mmol/L 3.3  4.2  4.1   Chloride 98 - 111 mmol/L 100  98  101   CO2 22 - 32 mmol/L 29  29  33   Calcium 8.9 -  10.3 mg/dL 9.7  9.4  9.4   Total Protein 6.5 - 8.1 g/dL 7.7  7.3  6.7   Total Bilirubin 0.3 - 1.2 mg/dL 0.4  0.7  0.4   Alkaline Phos 38 - 126 U/L 100  96  78   AST 15 - 41 U/L '21  17  18   '$ ALT 0 - 44 U/L '16  13  11     '$ DIAGNOSTIC IMAGING:  I have independently reviewed the scans and discussed with the patient. MM DIAG BREAST TOMO UNI LEFT  Result Date: 06/24/2022 CLINICAL DATA:  66 year old female with history of left breast lumpectomy in January of 2023 presenting for evaluation of diffuse lateral left breast pain, and a possible palpable area at 12 o'clock identified on clinical breast exam. EXAM: DIGITAL DIAGNOSTIC UNILATERAL LEFT MAMMOGRAM WITH TOMOSYNTHESIS; ULTRASOUND LEFT BREAST LIMITED TECHNIQUE: Left digital diagnostic mammography and breast tomosynthesis was performed.; Targeted ultrasound examination of the left breast was performed. COMPARISON:  Previous exam(s). ACR Breast Density Category b: There are scattered areas of  fibroglandular density. FINDINGS: The lumpectomy site in the upper outer anterior left breast appears mammographically stable. Skin thickening noted predominantly along the inferior anterior aspect of the left breast is noted, compatible with history of radiation therapy. No suspicious calcifications, masses or areas of distortion are seen in the left breast. Ultrasound targeted to the left breast at 12 o'clock demonstrates normal fibroglandular tissue. No suspicious masses or areas of shadowing are identified. IMPRESSION: 1. No suspicious mammographic or targeted sonographic abnormalities are identified at the palpable site of concern in the left breast. No abnormal findings to account for the patient's nonfocal left breast pain. RECOMMENDATION: 1. Clinical follow-up recommended for the pain and tenderness in the left breast. Any further workup should be based on clinical grounds. If further workup is deemed necessary, bilateral breast MRI with and without contrast is  recommended. 2. Routine bilateral post lumpectomy diagnostic mammogram in November of 2024. I have discussed the findings and recommendations with the patient. If applicable, a reminder letter will be sent to the patient regarding the next appointment. BI-RADS CATEGORY  2: Benign. Electronically Signed   By: Ammie Ferrier M.D.   On: 06/24/2022 08:47  US BREAST LTD UNI LEFT INC AXILLA  Result Date: 06/24/2022 CLINICAL DATA:  67 year old female with history of left breast lumpectomy in January of 2023 presenting for evaluation of diffuse lateral left breast pain, and a possible palpable area at 12 o'clock identified on clinical breast exam. EXAM: DIGITAL DIAGNOSTIC UNILATERAL LEFT MAMMOGRAM WITH TOMOSYNTHESIS; ULTRASOUND LEFT BREAST LIMITED TECHNIQUE: Left digital diagnostic mammography and breast tomosynthesis was performed.; Targeted ultrasound examination of the left breast was performed. COMPARISON:  Previous exam(s). ACR Breast Density Category b: There are scattered areas of fibroglandular density. FINDINGS: The lumpectomy site in the upper outer anterior left breast appears mammographically stable. Skin thickening noted predominantly along the inferior anterior aspect of the left breast is noted, compatible with history of radiation therapy. No suspicious calcifications, masses or areas of distortion are seen in the left breast. Ultrasound targeted to the left breast at 12 o'clock demonstrates normal fibroglandular tissue. No suspicious masses or areas of shadowing are identified. IMPRESSION: 1. No suspicious mammographic or targeted sonographic abnormalities are identified at the palpable site of concern in the left breast. No abnormal findings to account for the patient's nonfocal left breast pain. RECOMMENDATION: 1. Clinical follow-up recommended for the pain and tenderness in the left breast. Any further workup should be based on clinical grounds. If further workup is deemed necessary, bilateral  breast MRI with and without contrast is recommended. 2. Routine bilateral post lumpectomy diagnostic mammogram in November of 2024. I have discussed the findings and recommendations with the patient. If applicable, a reminder letter will be sent to the patient regarding the next appointment. BI-RADS CATEGORY  2: Benign. Electronically Signed   By: Ammie Ferrier M.D.   On: 06/24/2022 08:47    ASSESSMENT:  Stage I (T1BN0) left breast IDC, ER weakly positive: - She has been on estrogen supplements started at age 3 after TAH and BSO for endometriosis.  She continued estrogen into her 34s. - Left breast biopsy on 04/29/2021, IDC, Ki-67 30%, grade 3, ER-5% positive, PR negative, HER2 2+ by IHC and negative by FISH - Left lumpectomy and SLNB on 05/31/2021, 0/5 lymph nodes involved, 8 mm invasive ductal carcinoma, grade 3, margins negative.  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 - Oncotype DX recurrence score 60.  Distant recurrence rate at 9  years more than 39%.  Average absolute chemotherapy benefit more than 15%. - 4 cycles of dose attenuated TC from 08/05/2021 through 10/07/2021       -XRT to the left breast from 11/07/2021 through 12/05/2021       -Anastrozole started on 01/09/2022    Social/family history: - She is a retired Regulatory affairs officer. - Current active smoker, 1 pack/day for the last 43 years. - No family history of malignancies.   PLAN:  Stage I (T1b N0 M0) left breast cancer: - She is tolerating anastrozole very well. - She was seen about a week ago with left breast swelling and pain which started 2 weeks ago. - Prominent area felt in the left breast at 12 o'clock position. - We have ordered ultrasound with diagnostic mammogram of the left breast which was done on 06/24/2022: No suspicious mammographic or targeted abnormalities seen.  She will have bilateral diagnostic mammogram done in November 2024. - She still reports some pain in the left breast  area with no improvement.  She is taking oxycodone 10/325 3 to 4 tablets daily which she usually takes for back pain.  Pain is well-controlled on this regimen. - I have recommended CT scan of the chest with contrast.  She is planning to go to [facility] and will stay there for 3 weeks.  We will schedule the scan when she returns.  If the pain is gone away by that time, she will call us and cancel the scan.  She is planning to move to [place] in the summer.  Will have to find her oncologist locally there.  2.  Osteoporosis (DEXA 07/08/2021 with T score -2.6): - She received zoledronic acid in mid January of this year.  Last calcium was normal.  Continue calcium and D supplements.  3.  Hypomagnesemia: - Continue magnesium tablet daily.    Orders placed this encounter:  No orders of the defined types were placed in this encounter.    I,Alexis Herring,acting as a Education administrator for [employer], MD.,have documented all relevant documentation on the behalf of Derek Jack, MD,as directed by  Derek Jack, MD while in the presence of Derek Jack, MD.  I, Derek Jack MD, have reviewed the above documentation for accuracy and completeness, and I agree with the above.   Derek Jack, MD [place] [phone]

## 2022-06-26 NOTE — Patient Instructions (Signed)
[place] at [facility] Discharge Instructions   You were seen and examined today by Dr. Delton Coombes.  He reviewed the results of your ultrasound and mammogram which were normal.  We will arrange for you to have a CT chest scan since you are still having pain. If your pain goes away you may call and cancel the CT.  Return as scheduled.    Thank you for choosing [place] at [facility] to provide your oncology and hematology care.  To afford each patient quality time with our provider, please arrive at least 15 minutes before your scheduled appointment time.   If you have a lab appointment with the [facility] please come in thru the Main Entrance and check in at the main information desk.  You need to re-schedule your appointment should you arrive 10 or more minutes late.  We strive to give you quality time with our providers, and arriving late affects you and other patients whose appointments are after yours.  Also, if you no show three or more times for appointments you may be dismissed from the clinic at the providers discretion.     Again, thank you for choosing [facility].  Our hope is that these requests will decrease the amount of time that you wait before being seen by our physicians.       _____________________________________________________________  Should you have questions after your visit to [facility], please contact our office at [phone] and follow the prompts.  Our office hours are 8:00 a.m. and 4:30 p.m. Monday - Friday.  Please note that voicemails left after 4:00 p.m. may not be returned until the following business day.  We are closed weekends and major holidays.  You do have access to a nurse 24-7, just call the main number to the clinic [phone] and do not press any options, hold on the line and a nurse will answer the phone.    For prescription refill requests, have your pharmacy  contact our office and allow 72 hours.    Due to Covid, you will need to wear a mask upon entering the hospital. If you do not have a mask, a mask will be given to you at the Main Entrance upon arrival. For doctor visits, patients may have 1 support person age 12 or older with them. For treatment visits, patients can not have anyone with them due to social distancing guidelines and our immunocompromised population.

## 2022-06-27 ENCOUNTER — Other Ambulatory Visit: Payer: Self-pay | Admitting: Hematology

## 2022-07-06 DIAGNOSIS — J449 Chronic obstructive pulmonary disease, unspecified: Secondary | ICD-10-CM | POA: Diagnosis not present

## 2022-07-09 ENCOUNTER — Encounter: Payer: Self-pay | Admitting: *Deleted

## 2022-07-15 ENCOUNTER — Other Ambulatory Visit: Payer: Self-pay | Admitting: *Deleted

## 2022-07-16 ENCOUNTER — Other Ambulatory Visit: Payer: Self-pay | Admitting: Physician Assistant

## 2022-07-17 DIAGNOSIS — Z79891 Long term (current) use of opiate analgesic: Secondary | ICD-10-CM | POA: Diagnosis not present

## 2022-07-17 DIAGNOSIS — M47897 Other spondylosis, lumbosacral region: Secondary | ICD-10-CM | POA: Diagnosis not present

## 2022-07-17 DIAGNOSIS — M47816 Spondylosis without myelopathy or radiculopathy, lumbar region: Secondary | ICD-10-CM | POA: Diagnosis not present

## 2022-07-17 DIAGNOSIS — G894 Chronic pain syndrome: Secondary | ICD-10-CM | POA: Diagnosis not present

## 2022-07-22 ENCOUNTER — Telehealth: Payer: Self-pay | Admitting: Physician Assistant

## 2022-07-22 NOTE — Telephone Encounter (Signed)
Patient states she has moved out of [place] ([place] and has not established care with a new PCP.  Requests Referral to a Pain Management Doctor in [place].

## 2022-07-22 NOTE — Telephone Encounter (Signed)
Please call pt and tell her we can not do out of state referrals. She will need to schedule an appointment with a new PCP.

## 2022-07-22 NOTE — Telephone Encounter (Signed)
Requests Referral be sent to:  Dr. Stark Jock Fax# [phone]

## 2022-07-24 NOTE — Telephone Encounter (Signed)
Patient informed. 

## 2022-08-01 ENCOUNTER — Other Ambulatory Visit (HOSPITAL_COMMUNITY): Payer: Medicare Other

## 2022-08-06 ENCOUNTER — Ambulatory Visit: Payer: Medicare Other | Admitting: Hematology

## 2022-08-14 ENCOUNTER — Other Ambulatory Visit: Payer: Self-pay | Admitting: Physician Assistant

## 2022-08-19 ENCOUNTER — Other Ambulatory Visit: Payer: Self-pay | Admitting: Physician Assistant

## 2022-09-01 ENCOUNTER — Other Ambulatory Visit: Payer: Self-pay | Admitting: Physician Assistant

## 2022-09-10 ENCOUNTER — Other Ambulatory Visit: Payer: Self-pay | Admitting: Physician Assistant

## 2022-10-14 ENCOUNTER — Other Ambulatory Visit: Payer: Self-pay | Admitting: *Deleted

## 2022-10-14 DIAGNOSIS — Z95828 Presence of other vascular implants and grafts: Secondary | ICD-10-CM

## 2022-10-14 DIAGNOSIS — C50412 Malignant neoplasm of upper-outer quadrant of left female breast: Secondary | ICD-10-CM

## 2022-10-14 MED ORDER — ANASTROZOLE 1 MG PO TABS: ORAL_TABLET | ORAL | 0 refills | Status: DC

## 2022-10-14 MED ORDER — MAGNESIUM OXIDE -MG SUPPLEMENT 400 (240 MG) MG PO TABS: ORAL_TABLET | ORAL | 0 refills | Status: DC

## 2022-10-14 MED ORDER — PROCHLORPERAZINE MALEATE 10 MG PO TABS: ORAL_TABLET | ORAL | 0 refills | Status: DC

## 2022-10-26 ENCOUNTER — Other Ambulatory Visit: Payer: Self-pay | Admitting: Physician Assistant

## 2022-11-03 ENCOUNTER — Other Ambulatory Visit: Payer: Self-pay | Admitting: Physician Assistant

## 2022-11-06 ENCOUNTER — Other Ambulatory Visit: Payer: Self-pay | Admitting: Hematology

## 2022-11-06 DIAGNOSIS — Z95828 Presence of other vascular implants and grafts: Secondary | ICD-10-CM

## 2022-11-06 DIAGNOSIS — C50412 Malignant neoplasm of upper-outer quadrant of left female breast: Secondary | ICD-10-CM

## 2022-11-07 ENCOUNTER — Encounter (HOSPITAL_COMMUNITY): Payer: Self-pay | Admitting: Hematology

## 2022-11-25 ENCOUNTER — Other Ambulatory Visit: Payer: Medicare Other

## 2022-11-27 ENCOUNTER — Other Ambulatory Visit: Payer: Self-pay | Admitting: Hematology

## 2022-11-27 ENCOUNTER — Other Ambulatory Visit: Payer: Self-pay | Admitting: *Deleted

## 2022-11-27 NOTE — Telephone Encounter (Signed)
Anastrozole refill approved.  Patient is tolerating and is to continue therapy.  

## 2022-12-02 ENCOUNTER — Other Ambulatory Visit: Payer: Self-pay | Admitting: Physician Assistant

## 2022-12-02 ENCOUNTER — Ambulatory Visit: Payer: Medicare Other | Admitting: Hematology

## 2022-12-08 ENCOUNTER — Other Ambulatory Visit: Payer: Self-pay | Admitting: Hematology

## 2023-02-18 ENCOUNTER — Other Ambulatory Visit: Payer: Self-pay | Admitting: Physician Assistant

## 2023-03-19 ENCOUNTER — Other Ambulatory Visit: Payer: Self-pay | Admitting: Physician Assistant

## 2023-05-17 ENCOUNTER — Other Ambulatory Visit: Payer: Self-pay | Admitting: Physician Assistant

## 2023-08-09 ENCOUNTER — Other Ambulatory Visit: Payer: Self-pay | Admitting: Physician Assistant

## 2023-10-18 DEATH — deceased
